# Patient Record
Sex: Male | Born: 1950
Health system: Southern US, Community
[De-identification: ages and names within clinical notes are randomized; demographics above are authoritative.]

## PROBLEM LIST (undated history)

## (undated) DIAGNOSIS — I10 Essential (primary) hypertension: Secondary | ICD-10-CM

## (undated) DIAGNOSIS — E785 Hyperlipidemia, unspecified: Secondary | ICD-10-CM

## (undated) DIAGNOSIS — I442 Atrioventricular block, complete: Secondary | ICD-10-CM

## (undated) DIAGNOSIS — M1990 Unspecified osteoarthritis, unspecified site: Secondary | ICD-10-CM

## (undated) DIAGNOSIS — C61 Malignant neoplasm of prostate: Secondary | ICD-10-CM

## (undated) DIAGNOSIS — Z950 Presence of cardiac pacemaker: Secondary | ICD-10-CM

## (undated) HISTORY — DX: Hyperlipidemia, unspecified: E78.5

## (undated) HISTORY — DX: Atrioventricular block, complete: I44.2

## (undated) HISTORY — PX: PACEMAKER INSERTION: SHX728

## (undated) HISTORY — DX: Essential (primary) hypertension: I10

## (undated) HISTORY — PX: OTHER SURGICAL HISTORY: SHX169

## (undated) HISTORY — PX: JOINT REPLACEMENT: SHX530

---

## 1999-02-11 ENCOUNTER — Encounter: Payer: Self-pay | Admitting: Internal Medicine

## 1999-02-11 ENCOUNTER — Inpatient Hospital Stay (HOSPITAL_COMMUNITY): Admission: AD | Admit: 1999-02-11 | Discharge: 1999-02-12 | Payer: Self-pay | Admitting: Internal Medicine

## 1999-02-12 ENCOUNTER — Encounter: Payer: Self-pay | Admitting: Internal Medicine

## 1999-04-26 ENCOUNTER — Ambulatory Visit (HOSPITAL_COMMUNITY): Admission: RE | Admit: 1999-04-26 | Discharge: 1999-04-26 | Payer: Self-pay | Admitting: *Deleted

## 2005-07-29 ENCOUNTER — Ambulatory Visit: Payer: Self-pay | Admitting: Internal Medicine

## 2006-05-28 ENCOUNTER — Ambulatory Visit: Payer: Self-pay | Admitting: Internal Medicine

## 2006-09-23 ENCOUNTER — Ambulatory Visit: Payer: Self-pay | Admitting: Internal Medicine

## 2007-10-05 ENCOUNTER — Ambulatory Visit: Payer: Self-pay | Admitting: Internal Medicine

## 2007-10-05 LAB — CONVERTED CEMR LAB
ALT: 32 units/L (ref 0–53)
AST: 22 units/L (ref 0–37)
Albumin: 4.3 g/dL (ref 3.5–5.2)
Alkaline Phosphatase: 53 units/L (ref 39–117)
BUN: 17 mg/dL (ref 6–23)
Basophils Absolute: 0 10*3/uL (ref 0.0–0.1)
Basophils Relative: 0.4 % (ref 0.0–1.0)
Bilirubin Urine: NEGATIVE
Bilirubin, Direct: 0.2 mg/dL (ref 0.0–0.3)
CO2: 29 meq/L (ref 19–32)
Calcium: 9.4 mg/dL (ref 8.4–10.5)
Chloride: 105 meq/L (ref 96–112)
Cholesterol: 136 mg/dL (ref 0–200)
Creatinine, Ser: 1.2 mg/dL (ref 0.4–1.5)
Eosinophils Absolute: 0.1 10*3/uL (ref 0.0–0.6)
Eosinophils Relative: 1.3 % (ref 0.0–5.0)
GFR calc Af Amer: 80 mL/min
GFR calc non Af Amer: 66 mL/min
Glucose, Bld: 128 mg/dL — ABNORMAL HIGH (ref 70–99)
Glucose, Urine, Semiquant: NEGATIVE
HCT: 43.6 % (ref 39.0–52.0)
HDL: 37.5 mg/dL — ABNORMAL LOW (ref >39.0)
Hemoglobin: 14.4 g/dL (ref 13.0–17.0)
Hgb A1c MFr Bld: 5.6 % (ref 4.6–6.0)
Ketones, urine, test strip: NEGATIVE
LDL Cholesterol: 80 mg/dL (ref 0–99)
Lymphocytes Relative: 24.5 % (ref 12.0–46.0)
MCHC: 33.1 g/dL (ref 30.0–36.0)
MCV: 88.5 fL (ref 78.0–100.0)
Monocytes Absolute: 0.5 10*3/uL (ref 0.2–0.7)
Monocytes Relative: 8.4 % (ref 3.0–11.0)
Neutro Abs: 3.5 10*3/uL (ref 1.4–7.7)
Neutrophils Relative %: 65.4 % (ref 43.0–77.0)
Nitrite: NEGATIVE
PSA: 1.85 ng/mL (ref 0.10–4.00)
Platelets: 205 10*3/uL (ref 150–400)
Potassium: 4.9 meq/L (ref 3.5–5.1)
Protein, U semiquant: NEGATIVE
RBC: 4.93 M/uL (ref 4.22–5.81)
RDW: 12.2 % (ref 11.5–14.6)
Sodium: 140 meq/L (ref 135–145)
Specific Gravity, Urine: 1.02
TSH: 3.36 microintl units/mL (ref 0.35–5.50)
Total Bilirubin: 1.1 mg/dL (ref 0.3–1.2)
Total CHOL/HDL Ratio: 3.6
Total Protein: 6.9 g/dL (ref 6.0–8.3)
Triglycerides: 92 mg/dL (ref 0–149)
Urobilinogen, UA: 0.2
VLDL: 18 mg/dL (ref 0–40)
WBC Urine, dipstick: NEGATIVE
WBC: 5.4 10*3/uL (ref 4.5–10.5)
pH: 5.5

## 2008-03-16 ENCOUNTER — Ambulatory Visit: Payer: Self-pay | Admitting: Internal Medicine

## 2008-03-16 DIAGNOSIS — I10 Essential (primary) hypertension: Secondary | ICD-10-CM | POA: Insufficient documentation

## 2008-05-09 ENCOUNTER — Ambulatory Visit: Payer: Self-pay | Admitting: Internal Medicine

## 2008-05-24 ENCOUNTER — Ambulatory Visit: Payer: Self-pay | Admitting: Internal Medicine

## 2008-05-31 ENCOUNTER — Telehealth: Payer: Self-pay | Admitting: Internal Medicine

## 2008-08-14 ENCOUNTER — Telehealth: Payer: Self-pay | Admitting: Internal Medicine

## 2008-11-04 ENCOUNTER — Encounter (INDEPENDENT_AMBULATORY_CARE_PROVIDER_SITE_OTHER): Payer: Self-pay | Admitting: *Deleted

## 2008-11-16 ENCOUNTER — Encounter: Payer: Self-pay | Admitting: Internal Medicine

## 2008-11-16 ENCOUNTER — Ambulatory Visit: Payer: Self-pay

## 2009-03-06 ENCOUNTER — Ambulatory Visit: Payer: Self-pay | Admitting: Internal Medicine

## 2009-03-06 LAB — CONVERTED CEMR LAB
ALT: 28 units/L (ref 0–53)
AST: 22 units/L (ref 0–37)
Albumin: 4 g/dL (ref 3.5–5.2)
Alkaline Phosphatase: 49 units/L (ref 39–117)
BUN: 28 mg/dL — ABNORMAL HIGH (ref 6–23)
Basophils Absolute: 0 10*3/uL (ref 0.0–0.1)
Basophils Relative: 0.3 % (ref 0.0–3.0)
Bilirubin Urine: NEGATIVE
Bilirubin, Direct: 0.1 mg/dL (ref 0.0–0.3)
CO2: 28 meq/L (ref 19–32)
Calcium: 8.8 mg/dL (ref 8.4–10.5)
Chloride: 109 meq/L (ref 96–112)
Cholesterol: 159 mg/dL (ref 0–200)
Creatinine, Ser: 1.3 mg/dL (ref 0.4–1.5)
Eosinophils Absolute: 0.1 10*3/uL (ref 0.0–0.7)
Eosinophils Relative: 2.4 % (ref 0.0–5.0)
GFR calc non Af Amer: 59.75 mL/min (ref >60)
Glucose, Bld: 112 mg/dL — ABNORMAL HIGH (ref 70–99)
HCT: 42 % (ref 39.0–52.0)
HDL: 42.7 mg/dL (ref >39.00)
Hemoglobin: 14.5 g/dL (ref 13.0–17.0)
Ketones, ur: NEGATIVE mg/dL
LDL Cholesterol: 97 mg/dL (ref 0–99)
Leukocytes, UA: NEGATIVE
Lymphocytes Relative: 23.6 % (ref 12.0–46.0)
Lymphs Abs: 1.3 10*3/uL (ref 0.7–4.0)
MCHC: 34.4 g/dL (ref 30.0–36.0)
MCV: 88.5 fL (ref 78.0–100.0)
Monocytes Absolute: 0.5 10*3/uL (ref 0.1–1.0)
Monocytes Relative: 9.1 % (ref 3.0–12.0)
Neutro Abs: 3.8 10*3/uL (ref 1.4–7.7)
Neutrophils Relative %: 64.6 % (ref 43.0–77.0)
Nitrite: NEGATIVE
PSA: 1.14 ng/mL (ref 0.10–4.00)
Platelets: 182 10*3/uL (ref 150.0–400.0)
Potassium: 4.5 meq/L (ref 3.5–5.1)
RBC: 4.75 M/uL (ref 4.22–5.81)
RDW: 12.5 % (ref 11.5–14.6)
Sodium: 141 meq/L (ref 135–145)
Specific Gravity, Urine: >=1.03 (ref 1.000–1.030)
TSH: 3.51 microintl units/mL (ref 0.35–5.50)
Total Bilirubin: 0.8 mg/dL (ref 0.3–1.2)
Total CHOL/HDL Ratio: 4
Total Protein, Urine: NEGATIVE mg/dL
Total Protein: 6.9 g/dL (ref 6.0–8.3)
Triglycerides: 97 mg/dL (ref 0.0–149.0)
Urine Glucose: NEGATIVE mg/dL
Urobilinogen, UA: 0.2 (ref 0.0–1.0)
VLDL: 19.4 mg/dL (ref 0.0–40.0)
WBC: 5.7 10*3/uL (ref 4.5–10.5)
pH: 5.5 (ref 5.0–8.0)

## 2009-03-28 ENCOUNTER — Ambulatory Visit: Payer: Self-pay | Admitting: Internal Medicine

## 2009-03-28 DIAGNOSIS — E785 Hyperlipidemia, unspecified: Secondary | ICD-10-CM | POA: Insufficient documentation

## 2009-03-28 DIAGNOSIS — I442 Atrioventricular block, complete: Secondary | ICD-10-CM | POA: Insufficient documentation

## 2009-05-01 ENCOUNTER — Ambulatory Visit: Payer: Self-pay | Admitting: Internal Medicine

## 2009-05-07 ENCOUNTER — Encounter (INDEPENDENT_AMBULATORY_CARE_PROVIDER_SITE_OTHER): Payer: Self-pay | Admitting: *Deleted

## 2009-06-13 ENCOUNTER — Encounter (INDEPENDENT_AMBULATORY_CARE_PROVIDER_SITE_OTHER): Payer: Self-pay | Admitting: *Deleted

## 2009-07-05 ENCOUNTER — Ambulatory Visit: Payer: Self-pay | Admitting: Internal Medicine

## 2009-10-23 ENCOUNTER — Ambulatory Visit: Payer: Self-pay | Admitting: Internal Medicine

## 2010-05-21 ENCOUNTER — Ambulatory Visit: Payer: Self-pay | Admitting: Family Medicine

## 2010-05-21 ENCOUNTER — Ambulatory Visit: Payer: Self-pay | Admitting: Internal Medicine

## 2010-05-23 DIAGNOSIS — Z950 Presence of cardiac pacemaker: Secondary | ICD-10-CM | POA: Insufficient documentation

## 2010-08-11 LAB — CONVERTED CEMR LAB
ALT: 27 units/L (ref 0–40)
AST: 19 units/L (ref 0–37)
Albumin: 4.1 g/dL (ref 3.5–5.2)
Alkaline Phosphatase: 49 units/L (ref 39–117)
BUN: 21 mg/dL (ref 6–23)
Basophils Absolute: 0 10*3/uL (ref 0.0–0.1)
Basophils Relative: 0.5 % (ref 0.0–1.0)
CO2: 29 meq/L (ref 19–32)
Calcium: 9.1 mg/dL (ref 8.4–10.5)
Chloride: 106 meq/L (ref 96–112)
Chol/HDL Ratio, serum: 3.4
Cholesterol: 146 mg/dL (ref 0–200)
Creatinine, Ser: 1.5 mg/dL (ref 0.4–1.5)
Eosinophil percent: 1.2 % (ref 0.0–5.0)
GFR calc non Af Amer: 51 mL/min
Glomerular Filtration Rate, Af Am: 62 mL/min/{1.73_m2}
Glucose, Bld: 107 mg/dL — ABNORMAL HIGH (ref 70–99)
HCT: 43.3 % (ref 39.0–52.0)
HDL: 43.1 mg/dL (ref >39.0)
Hemoglobin: 14.6 g/dL (ref 13.0–17.0)
LDL Cholesterol: 85 mg/dL (ref 0–99)
Lymphocytes Relative: 26.3 % (ref 12.0–46.0)
MCHC: 33.8 g/dL (ref 30.0–36.0)
MCV: 87.6 fL (ref 78.0–100.0)
Monocytes Absolute: 0.5 10*3/uL (ref 0.2–0.7)
Monocytes Relative: 8.5 % (ref 3.0–11.0)
Neutro Abs: 3.4 10*3/uL (ref 1.4–7.7)
Neutrophils Relative %: 63.5 % (ref 43.0–77.0)
PSA: 1 ng/mL (ref 0.10–4.00)
Platelets: 231 10*3/uL (ref 150–400)
Potassium: 4.2 meq/L (ref 3.5–5.1)
RBC: 4.94 M/uL (ref 4.22–5.81)
RDW: 12.5 % (ref 11.5–14.6)
Sodium: 141 meq/L (ref 135–145)
TSH: 2.57 microintl units/mL (ref 0.35–5.50)
Testosterone, total: 4.2428 ng/mL
Total Bilirubin: 0.8 mg/dL (ref 0.3–1.2)
Total Protein: 6.8 g/dL (ref 6.0–8.3)
Triglyceride fasting, serum: 89 mg/dL (ref 0–149)
VLDL: 18 mg/dL (ref 0–40)
WBC: 5.4 10*3/uL (ref 4.5–10.5)

## 2010-08-13 NOTE — Assessment & Plan Note (Signed)
Summary: pc2 medtronic      Allergies Added: NKDA  Visit Type:  Follow-up Primary Provider:  Birdie Sons MD   History of Present Illness: Devin Mercado returns today for followup.  He is a pleasant middle aged man with a h/o CHB, s/p PPM.  He denies c/p or sob.  He admits to some dietary indiscretion.  No peripheral edema. He denies syncope. Since I last saw him he admits to a very sedentary life style.  Current Medications (verified): 1)  Lipitor 20 Mg Tabs (Atorvastatin Calcium) .... Take 1 Tablet By Mouth At Bedtime 2)  Aspirin 81 Mg Tbec (Aspirin) .... Once Daily 3)  Lisinopril 10 Mg  Tabs (Lisinopril) .... Take 1 Tab By Mouth Daily  Allergies (verified): No Known Drug Allergies  Past History:  Past Medical History: Last updated: 03/28/2009 CHB Hypertension Hyperlipidemia  Past Surgical History: Last updated: 03/28/2009 Permanent pacemaker  Review of Systems  The patient denies chest pain, syncope, dyspnea on exertion, and peripheral edema.    Vital Signs:  Patient profile:   60 year old male Height:      71.5 inches Weight:      213 pounds BMI:     29.40 Pulse rate:   76 / minute BP sitting:   142 / 88  (left arm)  Vitals Entered By: Laurance Flatten CMA (May 21, 2010 3:01 PM)  Physical Exam  General:  Well developed, well nourished, in no acute distress.  HEENT: normal Neck: supple. No JVD. Carotids 2+ bilaterally no bruits Cor: RRR no rubs, gallops or murmur Lungs: CTA. Well healed PPM incision. Ab: soft, nontender. nondistended. No HSM. Good bowel sounds Ext: warm. no cyanosis, clubbing or edema Neuro: alert and oriented. Grossly nonfocal. affect pleasant    PPM Specifications Following MD:  Lewayne Bunting, MD     PPM Vendor:  Medtronic     PPM Model Number:  [id number]     PPM Serial Number:  [id number] PPM DOI:  02/11/1999     PPM Implanting MD:  Lewayne Bunting, MD  Lead 1    Location: RA     DOI: 02/11/1999     Model #: [id number]     Serial #: [id number]      Status: active Lead 2    Location: RV     DOI: 02/11/1999     Model #: [id number]     Serial #: [id number]     Status: active   Indications:  Complete Heart Block   PPM Follow Up Battery Voltage:  2.73 V     Battery Est. Longevity:  16 mths     Pacer Dependent:  Yes       PPM Device Measurements Atrium  Amplitude: 2.80 mV, Impedance: 488 ohms, Threshold: 1.00 V at 0.40 msec Right Ventricle  Amplitude: 11.20 mV, Impedance: 723 ohms, Threshold: 1.00 V at 0.40 msec  Episodes MS Episodes:  45     Percent Mode Switch:  0%     Coumadin:  No Ventricular High Rate:  1     Atrial Pacing:  7%     Ventricular Pacing:  41.9%  Parameters Mode:  DDD     Lower Rate Limit:  60     Upper Rate Limit:  140 Paced AV Delay:  250     Sensed AV Delay:  250 Next Cardiology Appt Due:  11/12/2010 Tech Comments:  45 MODE SWITCHES--LONGEST WAS 12 MINUTES 25 SECONDS. 1 VHR EPISODE LASTING 2 SECONDS.  NORMAL DEVICE  FUNCTION.  NO CHANGES MADE. ROV IN 6 MTHS W/DEVICE CLINIC. Vella Kohler  May 21, 2010 3:07 PM MD Comments:  His mode switch rates are less than 200/min.  Impression & Recommendations:  Problem # 1:  CARDIAC PACEMAKER IN SITU (ICD-V45.01) His device is working normally.  Will recheck in several months.  Problem # 2:  HYPERTENSION (ICD-401.9) His blood pressure is fairly well controlled. Continue a low sodium diet.  His updated medication list for this problem includes:    Aspirin 81 Mg Tbec (Aspirin) ..... Once daily    Lisinopril 10 Mg Tabs (Lisinopril) .Marland Kitchen... Take 1 tab by mouth daily  Patient Instructions: 1)  Your physician wants you to follow-up in: 12 months with Dr Court Joy will receive a reminder letter in the mail two months in advance. If you don't receive a letter, please call our office to schedule the follow-up appointment.

## 2010-08-13 NOTE — Assessment & Plan Note (Signed)
Summary: DEVICE/SAF      Allergies Added: NKDA  Visit Type:  Follow-up Primary Provider:  Birdie Sons MD   History of Present Illness: Devin Mercado returns today for followup.  He is a pleasant middle aged man with a h/o CHB, s/p PPM.  He denies c/p or sob.  He admits to some dietary indiscretion.  No peripheral edema. He denies syncope.  Current Medications (verified): 1)  Lipitor 20 Mg Tabs (Atorvastatin Calcium) .... Take 1 Tablet By Mouth At Bedtime 2)  Aspirin 81 Mg Tbec (Aspirin) .... Once Daily 3)  Lisinopril 10 Mg  Tabs (Lisinopril) .... Take 1 Tab By Mouth Daily  Allergies (verified): No Known Drug Allergies  Past History:  Past Medical History: Last updated: 03/28/2009 CHB Hypertension Hyperlipidemia  Past Surgical History: Last updated: 03/28/2009 Permanent pacemaker  Review of Systems  The patient denies chest pain, syncope, dyspnea on exertion, and peripheral edema.    Vital Signs:  Patient profile:   60 year old male Height:      71.5 inches Weight:      218 pounds BMI:     30.09 Pulse rate:   75 / minute BP sitting:   154 / 88  (left arm)  Vitals Entered By: Laurance Flatten CMA (October 23, 2009 3:42 PM)  Physical Exam  General:  Well developed, well nourished, in no acute distress.  HEENT: normal Neck: supple. No JVD. Carotids 2+ bilaterally no bruits Cor: RRR no rubs, gallops or murmur Lungs: CTA. Well healed PPM incision. Ab: soft, nontender. nondistended. No HSM. Good bowel sounds Ext: warm. no cyanosis, clubbing or edema Neuro: alert and oriented. Grossly nonfocal. affect pleasant    PPM Specifications Following MD:  Lewayne Bunting, MD     PPM Vendor:  Medtronic     PPM Model Number:  [id number]     PPM Serial Number:  [id number] PPM DOI:  02/11/1999     PPM Implanting MD:  Lewayne Bunting, MD  Lead 1    Location: RA     DOI: 02/11/1999     Model #: [id number]     Serial #: [id number]     Status: active Lead 2    Location: RV     DOI: 02/11/1999     Model  #: [id number]     Serial #: [id number]     Status: active   Indications:  Complete Heart Block   PPM Follow Up Remote Check?  No Battery Voltage:  2.71 V     Battery Est. Longevity:  19 months     Pacer Dependent:  Yes       PPM Device Measurements Atrium  Amplitude: 2.8 mV, Impedance: 478 ohms, Threshold: 1.0 V at 0.4 msec Right Ventricle  Impedance: 741 ohms, Threshold: 1.0 V at 0.4 msec  Episodes MS Episodes:  18     Percent Mode Switch:  0%     Coumadin:  No Ventricular High Rate:  0     Atrial Pacing:  5.2%     Ventricular Pacing:  86.1%  Parameters Mode:  DDD     Lower Rate Limit:  60     Paced AV Delay:  250     Sensed AV Delay:  250 Next Cardiology Appt Due:  04/13/2010 Tech Comments:  No parameter changes.  Battery nearing ERI.  ROV 6 months with Dr.  Ladona Ridgel. Altha Harm, LPN  October 23, 2009 3:47 PM  MD Comments:  Agree with above.  Impression & Recommendations:  Problem # 1:  ATRIOVENTRICULAR BLOCK, COMPLETE (ICD-426.0) His PPM is working normally.  Continue followup in several months. His updated medication list for this problem includes:    Aspirin 81 Mg Tbec (Aspirin) ..... Once daily    Lisinopril 10 Mg Tabs (Lisinopril) .Marland Kitchen... Take 1 tab by mouth daily  Problem # 2:  HYPERLIPIDEMIA (ICD-272.4) A low fat diet and continued lipitor are recommended. His updated medication list for this problem includes:    Lipitor 20 Mg Tabs (Atorvastatin calcium) .Marland Kitchen... Take 1 tablet by mouth at bedtime  Problem # 3:  HYPERTENSION (ICD-401.9) I have asked him to watch his blood pressure which is up today.  He admits to some sodium indiscretion. His updated medication list for this problem includes:    Aspirin 81 Mg Tbec (Aspirin) ..... Once daily    Lisinopril 10 Mg Tabs (Lisinopril) .Marland Kitchen... Take 1 tab by mouth daily  Patient Instructions: 1)  Your physician recommends that you schedule a follow-up appointment in: 6 months with device clinic 12 months with Dr Ladona Ridgel

## 2010-08-13 NOTE — Assessment & Plan Note (Signed)
Summary: FLU SHOT//SLM   Nurse Visit   Allergies: No Known Drug Allergies  Orders Added: 1)  Admin 1st Vaccine [90471] 2)  Flu Vaccine 63yrs + [54098]   Flu Vaccine Consent Questions     Do you have a history of severe allergic reactions to this vaccine? no    Any prior history of allergic reactions to egg and/or gelatin? no    Do you have a sensitivity to the preservative Thimersol? no    Do you have a past history of Guillan-Barre Syndrome? no    Do you currently have an acute febrile illness? no    Have you ever had a severe reaction to latex? no    Vaccine information given and explained to patient? yes    Are you currently pregnant? no    Lot Number:[id number]   Exp Date:01/11/2011   Site Given  Left Deltoid IM Romualdo Bolk, CMA (AAMA)  May 21, 2010 2:03 PM

## 2010-08-15 NOTE — Cardiovascular Report (Signed)
Summary: Office Visit   Office Visit   Imported By: Roderic Ovens 07/04/2010 13:57:27  _____________________________________________________________________  External Attachment:    Type:   Image     Comment:   External Document

## 2010-11-20 ENCOUNTER — Ambulatory Visit (INDEPENDENT_AMBULATORY_CARE_PROVIDER_SITE_OTHER): Payer: BC Managed Care – PPO | Admitting: *Deleted

## 2010-11-20 DIAGNOSIS — Z950 Presence of cardiac pacemaker: Secondary | ICD-10-CM

## 2010-11-20 DIAGNOSIS — I442 Atrioventricular block, complete: Secondary | ICD-10-CM

## 2010-11-26 NOTE — Assessment & Plan Note (Signed)
Devin Mercado                         ELECTROPHYSIOLOGY OFFICE NOTE   Devin Mercado, Devin Mercado                       MRN:          [id number]  DATE:05/09/2008                            DOB:          October 26, 1950    Devin Mercado returns today for followup.  He is a very pleasant spouse of  Dr. Berniece Andreas with a history of hypertension and complete heart block  status post pacemaker insertion.  He returns today for followup.  He has  been stable since we last saw him several months ago.  He denies chest  pain, shortness of breath, or other palpitations.   His medicines include:  1. Lipitor 20 a day.  2. Aspirin 81 mg daily.  3. Lisinopril 10 mg daily (this was recently started by Dr. Cato Mulligan).   On physical examination, he is a pleasant, well-appearing, middle-aged  man in no distress.  Blood pressure was 146/91, the pulse 96 and  regular, respirations were 18.  The weight was 223 pounds.  Neck  revealed no jugular venous distention.  Lungs clear bilaterally to  auscultation.  No wheezes, rales, or rhonchi are present.  There is no  increased work of breathing.  Cardiovascular exam revealed a regular  rate and rhythm.  Normal S1 and S2.  The PMI was not enlarged or  laterally displaced.  The abdominal exam was soft, nontender,  nondistended.  There was no organomegaly.  Extremities demonstrated no  edema.   Interrogation of pacemaker demonstrates a Medtronic Sigma dual-chamber  device, the P-waves greater than 2, the R-waves greater than 11, the  impedance 497 in the A and 596 in the V, the threshold was a volt of 0.4  both in the right atrium and in the right ventricle.  Battery longevity  is approximately 2-1/2 years.  Voltage today is 2.75 V.   IMPRESSION:  1. Symptomatic bradycardia.  2. Complete heart block, causing symptomatic bradycardia.  3. Hypertension.  4. Dyslipidemia.  5. Status post pacemaker.   DISCUSSION:  Devin Mercado is stable, and his  pacemaker is working  normally.  His blood pressure is recently well controlled.  His followup  for his dyslipidemia will be by Dr. Cato Mulligan.  I will see him back for  pacemaker followup in approximately 1 year.     Doylene Canning. Devin Ridgel, MD  Electronically Signed   GWT/MedQ  DD: 05/09/2008  DT: 05/10/2008  Job #: [id number]   cc:   Valetta Mole. Swords, MD

## 2010-11-26 NOTE — Assessment & Plan Note (Signed)
[facility]                         ELECTROPHYSIOLOGY OFFICE NOTE   YOTAM, RHINE                       MRN:          [id number]  DATE:10/05/2007                            DOB:          05-Jun-1951    Mr. Stigall returns today for followup.  He is a very pleasant. middle-  aged male with a history of left bundle branch block which resulted in  development of complete heart block, history of hypertension (previously  controlled with diet and exercise) who is status post permanent  pacemaker insertion which was carried out back in July 2000.  He returns  today for followup.  He has no specific complaints today except for  occasional palpitations.  He denies chest pain or shortness of breath.  He admits to some sedentary lifestyle, however, and is not exercising  regularly.   MEDICATIONS:  1. Lipitor 20 a day.  2. Aspirin 81 a day.   PHYSICAL EXAMINATION:  GENERAL:  A pleasant, middle-aged man with no  acute distress.  VITAL SIGNS:  Blood pressure was 169/107, pulse 90 and regular,  respirations were 18.  Weight was 218 pounds.  NECK:  No jugular distention.  LUNGS:  Clear bilaterally to auscultation.  No wheezes, rales or rhonchi  are present.  CARDIAC:  Regular rate and rhythm with normal S1, S2.  EXTREMITIES:  No peripheral edema.   Interrogation of his pacemaker demonstrates a Medtronic sigma dual-  chamber device.  There are no R waves and P waves were greater than 2.8.  The impedance 498 in the A, 723 in the V.  The threshold was a volt of  0.4 in both the right atrium and right ventricle.  Battery voltage was  2.74 V.  He was 80% A-paced and 86% V-paced.   IMPRESSION:  1. Complete heart block.  2. Hypertension.  3. Status post pacemaker insertion.   DISCUSSION:  Overall, Mr. Guevara is stable, but I have told him that I  am concerned about his blood pressure being elevated.  He states that  when he takes it at home, his blood  pressure is okay, but does admit  that it may be elevated during the day when he is working.  To this end,  I have asked him to continue to check his blood pressure on a regular  basis, include the date and time and get this data to Dr. Birdie Sons,  who is his primary physician.  He, I suspect, will  require medical therapy for his hypertension and I will defer the  decisions on this to Dr. Cato Mulligan.  I will see the patient back for  pacemaker check in 1 year.  He will see our clinic in 6 months.     Doylene Canning. Ladona Ridgel, MD  Electronically Signed    GWT/MedQ  DD: 10/05/2007  DT: 10/05/2007  Job #: [id number]   cc:   Valetta Mole. Swords, MD

## 2010-11-29 NOTE — Assessment & Plan Note (Signed)
[facility]                         ELECTROPHYSIOLOGY OFFICE NOTE   OLLY, SHINER                       MRN:          [id number]  DATE:09/23/2006                            DOB:          05/31/1951    Devin Mercado returns today for followup.  He is a very pleasant middle-  aged man with complete heart block who is status post permanent  pacemaker insertion back in 2000.  He returns today for followup.  He  denies chest pain or shortness of breath and overall has been stable.  He denies palpitations.  He remains very active.   PHYSICAL EXAM:  He is a pleasant, well-appearing middle-aged man in no  acute distress.  His blood pressure today was 164/98, the pulse was 70  and regular, the respirations were 18, the weight was 218 pounds.  NECK:  No jugular venous distention.  LUNGS:  Clear bilaterally to auscultation.  CARDIOVASCULAR:  Regular rate and rhythm with a normal S1 and S2.  EXTREMITIES:  No edema.   Interrogation of his pacemaker demonstrates a Medtronic Sigma with P  waves of 2, there were no R waves.  The impedance 521 in the atrium, 792  in the ventricle, the threshold was a volt of 0.4 in both the atrium and  the ventricle.  The battery voltage was 2.76 volts.   IMPRESSION:  1. Symptomatic complete heart block.  2. Status post pacemaker insertion.  3. Dyslipidemia on Lipitor.   DISCUSSION:  Overall, Devin Mercado is stable.  His pacemaker is working  normally.  Will plan to see him back in the office in 1 year.     Doylene Canning. Ladona Ridgel, MD  Electronically Signed    GWT/MedQ  DD: 09/23/2006  DT: 09/25/2006  Job #: [id number]

## 2010-11-29 NOTE — Assessment & Plan Note (Signed)
[facility]                                 ON-CALL NOTE   Devin Mercado, Devin Mercado                       MRN:          [id number]  DATE:02/04/2008                            DOB:          11-Apr-1951    REGULAR DOCTOR:  Dr. Cato Mulligan.   ON CALL:  Marne A. Tower, MD   Phone number is [phone].  I spoke to his wife, Devin Mercado.  Chief complaint  is bug bite.  He was in [place] out in the woods about 3 weeks ago.  He has no noticed tick bites, however, has had an inflamed insect bite  on his leg for several days.  It is getting round approaching 8 cm,  erythematous with possible appearance of a target shape.  Concern was  for a possible Lyme disease given the size and shape of the lesion.  Denies fever, chills, or other symptoms.  I called in doxycycline 100 mg  b.i.d. x10 days, #20 with no refills to the [facility] [phone], and he will follow up with Dr. Cato Mulligan next week.  I advised him to  call back for any questions, concerns, or his symptoms change in any  way.     Marne A. Tower, MD  Electronically Signed    MAT/MedQ  DD: 02/04/2008  DT: 02/05/2008  Job #: [id number]

## 2010-12-19 ENCOUNTER — Encounter: Payer: Self-pay | Admitting: *Deleted

## 2011-02-12 ENCOUNTER — Ambulatory Visit (INDEPENDENT_AMBULATORY_CARE_PROVIDER_SITE_OTHER): Payer: BC Managed Care – PPO | Admitting: *Deleted

## 2011-02-12 ENCOUNTER — Other Ambulatory Visit: Payer: Self-pay | Admitting: Internal Medicine

## 2011-02-12 DIAGNOSIS — I442 Atrioventricular block, complete: Secondary | ICD-10-CM

## 2011-02-12 LAB — PACEMAKER DEVICE OBSERVATION
ATRIAL PACING PM: 6.3
RV LEAD IMPEDENCE PM: 740 Ohm

## 2011-02-12 NOTE — Progress Notes (Signed)
Pacer check for battery longevity.

## 2011-04-30 ENCOUNTER — Ambulatory Visit (INDEPENDENT_AMBULATORY_CARE_PROVIDER_SITE_OTHER): Payer: BC Managed Care – PPO | Admitting: *Deleted

## 2011-04-30 DIAGNOSIS — Z950 Presence of cardiac pacemaker: Secondary | ICD-10-CM

## 2011-04-30 DIAGNOSIS — I442 Atrioventricular block, complete: Secondary | ICD-10-CM

## 2011-04-30 LAB — PACEMAKER DEVICE OBSERVATION

## 2011-04-30 NOTE — Progress Notes (Signed)
Pacer check in clinic  

## 2011-05-06 ENCOUNTER — Ambulatory Visit (INDEPENDENT_AMBULATORY_CARE_PROVIDER_SITE_OTHER): Payer: BC Managed Care – PPO | Admitting: Internal Medicine

## 2011-05-06 ENCOUNTER — Encounter: Payer: Self-pay | Admitting: Internal Medicine

## 2011-05-06 ENCOUNTER — Encounter: Payer: Self-pay | Admitting: *Deleted

## 2011-05-06 ENCOUNTER — Telehealth: Payer: Self-pay | Admitting: Internal Medicine

## 2011-05-06 DIAGNOSIS — Z950 Presence of cardiac pacemaker: Secondary | ICD-10-CM

## 2011-05-06 DIAGNOSIS — I10 Essential (primary) hypertension: Secondary | ICD-10-CM

## 2011-05-06 DIAGNOSIS — I442 Atrioventricular block, complete: Secondary | ICD-10-CM

## 2011-05-06 DIAGNOSIS — I495 Sick sinus syndrome: Secondary | ICD-10-CM

## 2011-05-06 LAB — BASIC METABOLIC PANEL
Calcium: 8.5 mg/dL (ref 8.4–10.5)
Creatinine, Ser: 1.4 mg/dL (ref 0.4–1.5)
GFR: 54.02 mL/min — ABNORMAL LOW (ref >60.00)
Glucose, Bld: 87 mg/dL (ref 70–99)
Sodium: 140 mEq/L (ref 135–145)

## 2011-05-06 LAB — CBC WITH DIFFERENTIAL/PLATELET
Basophils Absolute: 0 10*3/uL (ref 0.0–0.1)
Hemoglobin: 14.2 g/dL (ref 13.0–17.0)
Lymphocytes Relative: 23.5 % (ref 12.0–46.0)
Monocytes Relative: 8.6 % (ref 3.0–12.0)
Neutro Abs: 3.8 10*3/uL (ref 1.4–7.7)
Neutrophils Relative %: 66.6 % (ref 43.0–77.0)
Platelets: 213 10*3/uL (ref 150.0–400.0)
RDW: 14 % (ref 11.5–14.6)

## 2011-05-06 LAB — PROTIME-INR
INR: 1 ratio (ref 0.8–1.0)
Prothrombin Time: 11.1 s (ref 10.2–12.4)

## 2011-05-06 NOTE — Telephone Encounter (Signed)
Pt calling wanting to talk to nurse about procedure scheduled. Please call back.

## 2011-05-06 NOTE — Assessment & Plan Note (Signed)
He has reached ERI and has reverted to VVI. Will schedule device change out.

## 2011-05-06 NOTE — Progress Notes (Signed)
HPI [name] returns today for followup. He is a pleasant 60 yo man with a h/o CHB, s/p PPM. He also has HTN. He has done well until 2 weeks ago. This coincides with his PPM reverting to the VVI mode. He denies dietary non-compliance. No sob or c/p. He has not had syncope. No Known Allergies   Current Outpatient Prescriptions  Medication Sig Dispense Refill  . aspirin 81 MG tablet Take 81 mg by mouth daily.        Marland Kitchen atorvastatin (LIPITOR) 20 MG tablet Take 20 mg by mouth daily.        Marland Kitchen lisinopril (PRINIVIL,ZESTRIL) 10 MG tablet Take 10 mg by mouth daily.           Past Medical History  Diagnosis Date  . CHB (complete heart block)   . HTN (hypertension)   . Hyperlipidemia     ROS:   All systems reviewed and negative except as noted in the HPI.   Past Surgical History  Procedure Date  . Permanent pacemaker      Family History  Problem Relation Age of Onset  . Stroke Mother     multiple  . Prostate cancer Father   . Stroke Father      History   Social History  . Marital Status: Married    Spouse Name: N/A    Number of Children: 4  . Years of Education: N/A   Occupational History  . Not on file.   Social History Main Topics  . Smoking status: Never Smoker   . Smokeless tobacco: Not on file  . Alcohol Use: Yes  . Drug Use: Not on file  . Sexually Active: Not on file   Other Topics Concern  . Not on file   Social History Narrative  . No narrative on file     BP 148/88  Pulse 65  Ht 6' (1.829 m)  Wt 223 lb 12.8 oz (101.515 kg)  BMI 30.35 kg/m2  Physical Exam:  Well appearing NAD HEENT: Unremarkable Neck:  No JVD, no thyromegally Lymphatics:  No adenopathy Back:  No CVA tenderness Lungs:  Clear with a well healed PPM incision. HEART:  Regular rate rhythm, no murmurs, no rubs, no clicks Abd:  soft, positive bowel sounds, no organomegally, no rebound, no guarding Ext:  2 plus pulses, no edema, no cyanosis, no clubbing Skin:  No rashes no  nodules Neuro:  CN II through XII intact, motor grossly intact   DEVICE  Normal device function.  See PaceArt for details.   Assess/Plan:

## 2011-05-06 NOTE — Patient Instructions (Signed)
Your physician has recommended that you have a pacemaker generator changed

## 2011-05-06 NOTE — Assessment & Plan Note (Signed)
His blood pressure is minimally elevated. I have asked him to reduce his sodium intake. He will continue his current meds.

## 2011-05-06 NOTE — Telephone Encounter (Signed)
Spoke with patient  Will change his procedure date and time per his request to this Fri 05/09/11  Be at the hospital at 5:30am for a 7:30 case  Patient aware

## 2011-05-09 ENCOUNTER — Ambulatory Visit (HOSPITAL_COMMUNITY)
Admission: RE | Admit: 2011-05-09 | Discharge: 2011-05-09 | Disposition: A | Discharge status: Home / Self Care | Payer: BC Managed Care – PPO | Source: Ambulatory Visit | Attending: Internal Medicine | Admitting: Internal Medicine

## 2011-05-09 ENCOUNTER — Ambulatory Visit (HOSPITAL_COMMUNITY): Payer: BC Managed Care – PPO

## 2011-05-09 DIAGNOSIS — I442 Atrioventricular block, complete: Secondary | ICD-10-CM

## 2011-05-09 DIAGNOSIS — I459 Conduction disorder, unspecified: Secondary | ICD-10-CM

## 2011-05-09 DIAGNOSIS — Z45018 Encounter for adjustment and management of other part of cardiac pacemaker: Secondary | ICD-10-CM | POA: Insufficient documentation

## 2011-05-09 LAB — SURGICAL PCR SCREEN
MRSA, PCR: NEGATIVE
Staphylococcus aureus: NEGATIVE

## 2011-05-11 NOTE — Op Note (Signed)
  NAMEGRAYLAND, DAISEY              ACCOUNT NO.:  [id number]  MEDICAL RECORD NO.:  [id number]  LOCATION:  [facility]                         FACILITY:  [facility]  PHYSICIAN:  Doylene Canning. Ladona Ridgel, MD    DATE OF BIRTH:  1951-03-05  DATE OF PROCEDURE:  05/09/2011 DATE OF DISCHARGE:                              OPERATIVE REPORT   PROCEDURE PERFORMED:  Removal of previously implanted dual-chamber pacemaker, which had reached the elective replacement indication and insertion of a new dual-chamber pacemaker.  INDICATION:  Symptomatic complete heart block status post pacemaker with pacemaker at elective replacement.  INTRODUCTION:  The patient is a very pleasant 60 year old man with a longstanding history of complete heart block, who underwent permanent pacemaker insertion in 2000.  He has done well since then.  He has reached elective replacement indication.  He is now referred for pacemaker generator change.  PROCEDURE:  After informed consent was obtained, the patient was taken to the diagnostic EP lab in a fasting state.  After usual preparation and draping, intravenous fentanyl and midazolam was given for sedation. Lidocaine 30 mL was infiltrated into the left infraclavicular region.  A 5 cm incision was carried out over this region, and electrocautery was utilized to dissect down to the pacemaker pocket.  The generator was removed without difficulty.  The atrial lead was disconnected and found to be working satisfactorily with P-waves of 6 and an impedance of 400 ohms, a threshold was less than a volt at 0.5 msec.  At this point, the ventricular lead was disconnected from the old pacemaker generator and evaluated.  There were off course no R-waves secondary to his underlying complete heart block.  The impedance was 500 ohms, the threshold was less than a volt at 0.5 msec.  At this point, the new Medtronic Adapta L dual-chamber pacemaker, serial number [id number] was connected to the atrial  and ventricular leads and placed back in the subcutaneous pocket. The pocket was irrigated with antibiotic irrigation.  The pocket was then revised to accommodate the larger sized pacemaker.  Having accomplished this, electrocautery was utilized to assure hemostasis and antibiotic irrigation was utilized to irrigate the pocket.  The incision was then closed with 2-0 and 3-0 Vicryl.  Benzoin and Steri-Strips were painted on the skin, pressure dressing was applied, and the patient was returned to his room in satisfactory condition.  COMPLICATIONS:  There were no immediate procedure complications.  RESULTS:  This demonstrate successful removal of a previously implanted dual-chamber pacemaker, which had reached elective replacement followed by insertion of a new dual-chamber pacemaker, which was preceded by pacemaker pocket revision.    Doylene Canning. Ladona Ridgel, MD    GWT/MEDQ  D:  05/09/2011  T:  05/09/2011  Job:  [id number]  Electronically Signed by Lewayne Bunting MD on 05/11/2011 12:21:26 PM

## 2011-05-15 ENCOUNTER — Encounter: Payer: BC Managed Care – PPO | Admitting: *Deleted

## 2011-06-06 ENCOUNTER — Ambulatory Visit (INDEPENDENT_AMBULATORY_CARE_PROVIDER_SITE_OTHER): Payer: BC Managed Care – PPO | Admitting: *Deleted

## 2011-06-06 DIAGNOSIS — Z23 Encounter for immunization: Secondary | ICD-10-CM

## 2011-06-19 ENCOUNTER — Ambulatory Visit (INDEPENDENT_AMBULATORY_CARE_PROVIDER_SITE_OTHER): Payer: BC Managed Care – PPO | Admitting: *Deleted

## 2011-06-19 ENCOUNTER — Encounter: Payer: Self-pay | Admitting: Internal Medicine

## 2011-06-19 DIAGNOSIS — I442 Atrioventricular block, complete: Secondary | ICD-10-CM

## 2011-06-19 LAB — PACEMAKER DEVICE OBSERVATION
AL AMPLITUDE: 2.8 mv
BATTERY VOLTAGE: 2.79 V
VENTRICULAR PACING PM: 100

## 2011-06-19 NOTE — Progress Notes (Signed)
PPM check 

## 2011-08-15 ENCOUNTER — Ambulatory Visit (INDEPENDENT_AMBULATORY_CARE_PROVIDER_SITE_OTHER): Payer: BC Managed Care – PPO | Admitting: Internal Medicine

## 2011-08-15 ENCOUNTER — Encounter: Payer: Self-pay | Admitting: Internal Medicine

## 2011-08-15 VITALS — BP 138/84 | HR 65 | Ht 72.0 in | Wt 224.8 lb

## 2011-08-15 DIAGNOSIS — I10 Essential (primary) hypertension: Secondary | ICD-10-CM

## 2011-08-15 DIAGNOSIS — I442 Atrioventricular block, complete: Secondary | ICD-10-CM

## 2011-08-15 DIAGNOSIS — Z950 Presence of cardiac pacemaker: Secondary | ICD-10-CM

## 2011-08-15 DIAGNOSIS — E785 Hyperlipidemia, unspecified: Secondary | ICD-10-CM

## 2011-08-15 LAB — PACEMAKER DEVICE OBSERVATION
AL THRESHOLD: 0.875 V
BAMS-0001: 150 {beats}/min
RV LEAD THRESHOLD: 1.25 V

## 2011-08-15 MED ORDER — LISINOPRIL 10 MG PO TABS: 10.0000 mg | ORAL_TABLET | Freq: Every day | ORAL | Status: DC

## 2011-08-15 NOTE — Assessment & Plan Note (Signed)
His device is working normally. Battery longevity is estimated at 12 years. He will followup in several months.

## 2011-08-15 NOTE — Progress Notes (Signed)
HPI Mr. Casher returns today for followup. He is a very pleasant 61 year old man with a history of complete heart block, status post permanent pacemaker insertion. In the interim he has done well. No chest pain, sob or syncope. He went skiing and at altitude he felt some palpitations. No peripheral edema, fevers, or chills. No Known Allergies   Current Outpatient Prescriptions  Medication Sig Dispense Refill  . aspirin 81 MG tablet Take 81 mg by mouth daily.        Marland Kitchen atorvastatin (LIPITOR) 20 MG tablet Take 20 mg by mouth daily.        Marland Kitchen lisinopril (PRINIVIL,ZESTRIL) 10 MG tablet Take 10 mg by mouth daily.           Past Medical History  Diagnosis Date  . CHB (complete heart block)   . HTN (hypertension)   . Hyperlipidemia     ROS:   All systems reviewed and negative except as noted in the HPI.   Past Surgical History  Procedure Date  . Permanent pacemaker      Family History  Problem Relation Age of Onset  . Stroke Mother     multiple  . Prostate cancer Father   . Stroke Father      History   Social History  . Marital Status: Married    Spouse Name: N/A    Number of Children: 4  . Years of Education: N/A   Occupational History  . Not on file.   Social History Main Topics  . Smoking status: Never Smoker   . Smokeless tobacco: Not on file  . Alcohol Use: Yes  . Drug Use: Not on file  . Sexually Active: Not on file   Other Topics Concern  . Not on file   Social History Narrative  . No narrative on file     BP 138/84  Pulse 65  Ht 6' (1.829 m)  Wt 101.969 kg (224 lb 12.8 oz)  BMI 30.49 kg/m2  Physical Exam:  Well appearing NAD HEENT: Unremarkable Neck:  No JVD, no thyromegally Lungs:  Clear with no wheezes, rales, or rhonchi. Well-healed pacemaker incision. HEART:  Regular rate rhythm, no murmurs, no rubs, no clicks Abd:  soft, positive bowel sounds, no organomegally, no rebound, no guarding Ext:  2 plus pulses, no edema, no cyanosis, no  clubbing Skin:  No rashes no nodules Neuro:  CN II through XII intact, motor grossly intact  DEVICE  Normal device function.  See PaceArt for details.   Assess/Plan:

## 2011-08-15 NOTE — Assessment & Plan Note (Signed)
His blood pressure is minimally elevated today. He is instructed to continue his current medical therapy and maintain a low sodium diet.

## 2011-08-15 NOTE — Assessment & Plan Note (Signed)
He is encouraged on the importance of a low fat diet and weight loss. No changes in medications at this time.

## 2011-08-15 NOTE — Patient Instructions (Signed)
Your physician wants you to follow-up in: Oct. 2013 with Dr Court Joy will receive a reminder letter in the mail two months in advance. If you don't receive a letter, please call our office to schedule the follow-up appointment.

## 2011-09-22 ENCOUNTER — Encounter: Payer: Self-pay | Admitting: Internal Medicine

## 2012-05-05 ENCOUNTER — Encounter: Payer: Self-pay | Admitting: *Deleted

## 2012-05-13 ENCOUNTER — Encounter: Payer: Self-pay | Admitting: Internal Medicine

## 2012-05-13 ENCOUNTER — Other Ambulatory Visit: Payer: Self-pay | Admitting: *Deleted

## 2012-05-13 ENCOUNTER — Other Ambulatory Visit: Payer: Self-pay | Admitting: Internal Medicine

## 2012-05-13 ENCOUNTER — Ambulatory Visit (INDEPENDENT_AMBULATORY_CARE_PROVIDER_SITE_OTHER): Payer: BC Managed Care – PPO | Admitting: Internal Medicine

## 2012-05-13 VITALS — BP 162/78 | HR 65 | Ht 72.0 in | Wt 216.0 lb

## 2012-05-13 DIAGNOSIS — I10 Essential (primary) hypertension: Secondary | ICD-10-CM

## 2012-05-13 DIAGNOSIS — Z950 Presence of cardiac pacemaker: Secondary | ICD-10-CM

## 2012-05-13 DIAGNOSIS — I442 Atrioventricular block, complete: Secondary | ICD-10-CM

## 2012-05-13 DIAGNOSIS — Z23 Encounter for immunization: Secondary | ICD-10-CM

## 2012-05-13 LAB — PACEMAKER DEVICE OBSERVATION
AL IMPEDENCE PM: 408 Ohm
AL THRESHOLD: 1 V
RV LEAD IMPEDENCE PM: 680 Ohm
RV LEAD THRESHOLD: 1 V

## 2012-05-13 MED ORDER — LISINOPRIL 10 MG PO TABS: 10.0000 mg | ORAL_TABLET | Freq: Every day | ORAL | Status: DC

## 2012-05-13 NOTE — Progress Notes (Signed)
HPI Devin Mercado returns today for followup. He is a very pleasant 61 year old man with hypertension, dyslipidemia, and complete heart block. He is status post pacemaker insertion and underwent generator change out several months ago. He is here today because he noticed palpitations at certain times a day, typically in the morning, and always with physical activity or exertion. He notes that when he walks down the hall going into court, he will feel like his heart is racing and beating irregularly. He has not had syncope or near-syncope he does get a bit dizzy. The episodes resolved with rest. It may occur for a couple of minutes. No Known Allergies   Current Outpatient Prescriptions  Medication Sig Dispense Refill  . aspirin 81 MG tablet Take 81 mg by mouth daily.        Marland Kitchen atorvastatin (LIPITOR) 20 MG tablet Take 20 mg by mouth daily.        Marland Kitchen lisinopril (PRINIVIL,ZESTRIL) 10 MG tablet Take 1 tablet (10 mg total) by mouth daily.  90 tablet  3  . DISCONTD: lisinopril (PRINIVIL,ZESTRIL) 10 MG tablet Take 1 tablet (10 mg total) by mouth daily.  90 tablet  3  . DISCONTD: lisinopril (PRINIVIL,ZESTRIL) 10 MG tablet Take 1 tablet (10 mg total) by mouth daily.  90 tablet  3     Past Medical History  Diagnosis Date  . CHB (complete heart block)   . HTN (hypertension)   . Hyperlipidemia     ROS:   All systems reviewed and negative except as noted in the HPI.   Past Surgical History  Procedure Date  . Permanent pacemaker      Family History  Problem Relation Age of Onset  . Stroke Mother     multiple  . Prostate cancer Father   . Stroke Father      History   Social History  . Marital Status: Married    Spouse Name: N/A    Number of Children: 4  . Years of Education: N/A   Occupational History  . Not on file.   Social History Main Topics  . Smoking status: Never Smoker   . Smokeless tobacco: Not on file  . Alcohol Use: Yes  . Drug Use: Not on file  . Sexually Active: Not  on file   Other Topics Concern  . Not on file   Social History Narrative  . No narrative on file     BP 162/78  Pulse 65  Ht 6' (1.829 m)  Wt 216 lb (97.977 kg)  BMI 29.29 kg/m2  SpO2 98%  Physical Exam:  Well appearing NAD HEENT: Unremarkable Neck:  No JVD, no thyromegally Lungs:  Clear with no wheezes, rales, or rhonchi. Well-healed pacemaker incision. HEART:  Regular rate rhythm, no murmurs, no rubs, no clicks Abd:  soft, positive bowel sounds, no organomegally, no rebound, no guarding Ext:  2 plus pulses, no edema, no cyanosis, no clubbing Skin:  No rashes no nodules Neuro:  CN II through XII intact, motor grossly intact  DEVICE  Normal device function.  See PaceArt for details.   Assess/Plan:

## 2012-05-13 NOTE — Patient Instructions (Signed)
Your physician wants you to follow-up in:  6 months in device clinic12 months with Dr Court Joy will receive a reminder letter in the mail two months in advance. If you don't receive a letter, please call our office to schedule the follow-up appointment.

## 2012-05-13 NOTE — Addendum Note (Signed)
Addended by: Alfred Levins D on: 05/13/2012 10:33 AM   Modules accepted: Orders

## 2012-05-13 NOTE — Assessment & Plan Note (Signed)
The patient's pacemaker is working normally. His programming is set such that he most which is at 150 beats per minute. He has underlying complete heart block. His upper tracking rate was at 130 beats per minute. My suspicion is that at least some of his symptoms are related to pacemaker week he block. Today we have reprogrammed his mode switch rate to 200 beats per minute, and we have also increased his upper tracking rate 160 beats per minute. His sensor rate is increased 150 beats per minute. Hopefully these programming changes will improve his symptoms. If not, I would have him undergo regular exercise treadmill testing.

## 2012-05-13 NOTE — Assessment & Plan Note (Signed)
His blood pressure is elevated today. He is instructed to reduce his sodium intake. He may ultimately require additional medical therapy.

## 2012-06-08 ENCOUNTER — Other Ambulatory Visit (INDEPENDENT_AMBULATORY_CARE_PROVIDER_SITE_OTHER): Payer: BC Managed Care – PPO

## 2012-06-08 DIAGNOSIS — Z0000 Encounter for general adult medical examination without abnormal findings: Secondary | ICD-10-CM

## 2012-06-08 LAB — POCT URINALYSIS DIPSTICK
Bilirubin, UA: NEGATIVE
Ketones, UA: NEGATIVE
Leukocytes, UA: NEGATIVE
Protein, UA: NEGATIVE
Spec Grav, UA: >=1.03

## 2012-06-08 LAB — PSA: PSA: 2.68 ng/mL (ref 0.10–4.00)

## 2012-06-08 LAB — CBC WITH DIFFERENTIAL/PLATELET
Basophils Relative: 0.5 % (ref 0.0–3.0)
Eosinophils Absolute: 0.1 10*3/uL (ref 0.0–0.7)
Lymphs Abs: 1.4 10*3/uL (ref 0.7–4.0)
MCHC: 32.9 g/dL (ref 30.0–36.0)
MCV: 89.1 fl (ref 78.0–100.0)
Monocytes Absolute: 0.4 10*3/uL (ref 0.1–1.0)
Neutrophils Relative %: 71 % (ref 43.0–77.0)
Platelets: 200 10*3/uL (ref 150.0–400.0)

## 2012-06-08 LAB — HEPATIC FUNCTION PANEL
Alkaline Phosphatase: 42 U/L (ref 39–117)
Bilirubin, Direct: 0.1 mg/dL (ref 0.0–0.3)
Total Bilirubin: 0.5 mg/dL (ref 0.3–1.2)

## 2012-06-08 LAB — BASIC METABOLIC PANEL
BUN: 29 mg/dL — ABNORMAL HIGH (ref 6–23)
Calcium: 8.8 mg/dL (ref 8.4–10.5)
GFR: 61.86 mL/min (ref >60.00)
Potassium: 4.6 mEq/L (ref 3.5–5.1)
Sodium: 138 mEq/L (ref 135–145)

## 2012-06-08 LAB — TSH: TSH: 2.73 u[IU]/mL (ref 0.35–5.50)

## 2012-06-08 LAB — LIPID PANEL
Cholesterol: 157 mg/dL (ref 0–200)
VLDL: 18.4 mg/dL (ref 0.0–40.0)

## 2012-06-15 ENCOUNTER — Encounter: Payer: BC Managed Care – PPO | Admitting: Internal Medicine

## 2012-06-16 ENCOUNTER — Ambulatory Visit (INDEPENDENT_AMBULATORY_CARE_PROVIDER_SITE_OTHER): Payer: BC Managed Care – PPO | Admitting: Internal Medicine

## 2012-06-16 ENCOUNTER — Encounter: Payer: Self-pay | Admitting: Internal Medicine

## 2012-06-16 VITALS — BP 196/104 | HR 76 | Temp 98.0°F | Ht 72.0 in | Wt 217.0 lb

## 2012-06-16 DIAGNOSIS — E785 Hyperlipidemia, unspecified: Secondary | ICD-10-CM

## 2012-06-16 DIAGNOSIS — Z0000 Encounter for general adult medical examination without abnormal findings: Secondary | ICD-10-CM

## 2012-06-16 DIAGNOSIS — I10 Essential (primary) hypertension: Secondary | ICD-10-CM

## 2012-06-16 MED ORDER — SIMVASTATIN 20 MG PO TABS: 20.0000 mg | ORAL_TABLET | Freq: Every day | ORAL | Status: DC

## 2012-06-16 NOTE — Patient Instructions (Signed)
Stop lipitor for 4 weeks and then start simvastatin Monitor BP- goal BP< 135/85

## 2012-06-16 NOTE — Progress Notes (Signed)
Patient ID: Devin Mercado, male   DOB: 1950-12-28, 61 y.o.   MRN: [id number] cpx  Has developed muscle twitching-- left side of neck (resolved) extending to left side of face. Symptoms for nearly one year.   Reviewed pmh, psh, soc hx   patient denies chest pain, shortness of breath, orthopnea. Denies lower extremity edema, abdominal pain, change in appetite, change in bowel movements. Patient denies rashes, musculoskeletal complaints. No other specific complaints in a complete review of systems.   Well-developed male in no acute distress. HEENT exam atraumatic, normocephalic, extraocular muscles are intact. Conjunctivae are pink without exudate. Neck is supple without lymphadenopathy, thyromegaly, jugular venous distention. Chest is clear to auscultation without increased work of breathing. Cardiac exam S1-S2 are regular. The PMI is normal. No significant murmurs or gallops. Abdominal exam active bowel sounds, soft, nontender. No abdominal bruits. Extremities no clubbing cyanosis or edema. Peripheral pulses are normal without bruits. Neurologic exam alert and oriented without any motor or sensory deficits. Rectal exam normal tone prostate normal size without masses or asymmetry.  A/p: well visit, health maint UTD

## 2012-06-17 NOTE — Assessment & Plan Note (Signed)
He will monitor at home goall bp <140/90

## 2013-01-31 ENCOUNTER — Encounter: Payer: Self-pay | Admitting: Internal Medicine

## 2013-02-02 ENCOUNTER — Encounter: Payer: Self-pay | Admitting: *Deleted

## 2013-05-13 ENCOUNTER — Other Ambulatory Visit (INDEPENDENT_AMBULATORY_CARE_PROVIDER_SITE_OTHER): Payer: BC Managed Care – PPO

## 2013-05-13 DIAGNOSIS — Z23 Encounter for immunization: Secondary | ICD-10-CM

## 2013-05-13 DIAGNOSIS — Z0000 Encounter for general adult medical examination without abnormal findings: Secondary | ICD-10-CM

## 2013-05-13 LAB — CBC WITH DIFFERENTIAL/PLATELET
Eosinophils Absolute: 0.1 10*3/uL (ref 0.0–0.7)
Eosinophils Relative: 2.7 % (ref 0.0–5.0)
HCT: 43.3 % (ref 39.0–52.0)
Lymphs Abs: 0.9 10*3/uL (ref 0.7–4.0)
MCHC: 33.7 g/dL (ref 30.0–36.0)
MCV: 86.9 fl (ref 78.0–100.0)
Monocytes Absolute: 0.6 10*3/uL (ref 0.1–1.0)
Neutrophils Relative %: 68.8 % (ref 43.0–77.0)
Platelets: 190 10*3/uL (ref 150.0–400.0)

## 2013-05-13 LAB — HEPATIC FUNCTION PANEL
ALT: 17 U/L (ref 0–53)
Bilirubin, Direct: 0.1 mg/dL (ref 0.0–0.3)
Total Bilirubin: 1 mg/dL (ref 0.3–1.2)
Total Protein: 6.7 g/dL (ref 6.0–8.3)

## 2013-05-13 LAB — POCT URINALYSIS DIPSTICK
Bilirubin, UA: NEGATIVE
Glucose, UA: NEGATIVE
Leukocytes, UA: NEGATIVE
Nitrite, UA: NEGATIVE
Urobilinogen, UA: 0.2
pH, UA: 5.5

## 2013-05-13 LAB — BASIC METABOLIC PANEL
BUN: 27 mg/dL — ABNORMAL HIGH (ref 6–23)
CO2: 28 mEq/L (ref 19–32)
Chloride: 102 mEq/L (ref 96–112)
Creatinine, Ser: 1.3 mg/dL (ref 0.4–1.5)
Glucose, Bld: 101 mg/dL — ABNORMAL HIGH (ref 70–99)
Potassium: 4.7 mEq/L (ref 3.5–5.1)

## 2013-05-13 LAB — LDL CHOLESTEROL, DIRECT: Direct LDL: 168.8 mg/dL

## 2013-05-13 LAB — LIPID PANEL: HDL: 46.6 mg/dL (ref >39.00)

## 2013-05-13 LAB — TSH: TSH: 2.94 u[IU]/mL (ref 0.35–5.50)

## 2013-05-13 LAB — PSA: PSA: 3.21 ng/mL (ref 0.10–4.00)

## 2013-06-10 ENCOUNTER — Encounter: Payer: Self-pay | Admitting: Internal Medicine

## 2013-06-10 ENCOUNTER — Ambulatory Visit (INDEPENDENT_AMBULATORY_CARE_PROVIDER_SITE_OTHER): Payer: BC Managed Care – PPO | Admitting: Internal Medicine

## 2013-06-10 VITALS — BP 142/90 | HR 75 | Temp 97.8°F | Resp 20 | Ht 71.5 in | Wt 219.0 lb

## 2013-06-10 DIAGNOSIS — Z2911 Encounter for prophylactic immunotherapy for respiratory syncytial virus (RSV): Secondary | ICD-10-CM

## 2013-06-10 DIAGNOSIS — Z23 Encounter for immunization: Secondary | ICD-10-CM

## 2013-06-10 DIAGNOSIS — Z0000 Encounter for general adult medical examination without abnormal findings: Secondary | ICD-10-CM

## 2013-06-10 DIAGNOSIS — E785 Hyperlipidemia, unspecified: Secondary | ICD-10-CM

## 2013-06-10 DIAGNOSIS — I10 Essential (primary) hypertension: Secondary | ICD-10-CM

## 2013-06-10 MED ORDER — LISINOPRIL 10 MG PO TABS: 10.0000 mg | ORAL_TABLET | Freq: Every day | ORAL | Status: DC

## 2013-06-10 MED ORDER — ATORVASTATIN CALCIUM 20 MG PO TABS: 20.0000 mg | ORAL_TABLET | Freq: Every day | ORAL | Status: DC

## 2013-06-10 MED ORDER — SIMVASTATIN 20 MG PO TABS: 20.0000 mg | ORAL_TABLET | Freq: Every day | ORAL | Status: DC

## 2013-06-10 NOTE — Addendum Note (Signed)
Addended by: Azucena Freed on: 06/10/2013 04:07 PM   Modules accepted: Orders

## 2013-06-10 NOTE — Progress Notes (Signed)
Pre-visit discussion using our clinic review tool. No additional management support is needed unless otherwise documented below in the visit note.  

## 2013-06-10 NOTE — Progress Notes (Signed)
cpx  Past Medical History  Diagnosis Date  . CHB (complete heart block)   . HTN (hypertension)   . Hyperlipidemia     History   Social History  . Marital Status: Married    Spouse Name: N/A    Number of Children: 4  . Years of Education: N/A   Occupational History  . Not on file.   Social History Main Topics  . Smoking status: Never Smoker   . Smokeless tobacco: Not on file  . Alcohol Use: Yes  . Drug Use: Not on file  . Sexual Activity: Not on file   Other Topics Concern  . Not on file   Social History Narrative  . No narrative on file    Past Surgical History  Procedure Laterality Date  . Permanent pacemaker      Family History  Problem Relation Age of Onset  . Stroke Mother     multiple  . Heart disease Mother   . Prostate cancer Father     No Known Allergies  Current Outpatient Prescriptions on File Prior to Visit  Medication Sig Dispense Refill  . aspirin 81 MG tablet Take 81 mg by mouth daily.         No current facility-administered medications on file prior to visit.     patient denies chest pain, shortness of breath, orthopnea. Denies lower extremity edema, abdominal pain, change in appetite, change in bowel movements. Patient denies rashes, musculoskeletal complaints. No other specific complaints in a complete review of systems.   BP 142/90  Pulse 75  Temp(Src) 97.8 F (36.6 C) (Oral)  Resp 20  Ht 5' 11.5" (1.816 m)  Wt 219 lb (99.338 kg)  BMI 30.12 kg/m2  SpO2 98% Well-developed male in no acute distress. HEENT exam atraumatic, normocephalic, extraocular muscles are intact. Conjunctivae are pink without exudate. Neck is supple without lymphadenopathy, thyromegaly, jugular venous distention. Chest is clear to auscultation without increased work of breathing. Cardiac exam S1-S2 are regular. The PMI is normal. No significant murmurs or gallops. Abdominal exam active bowel sounds, soft, nontender. No abdominal bruits. Extremities no clubbing  cyanosis or edema. Peripheral pulses are normal without bruits. Neurologic exam alert and oriented without any motor or sensory deficits. Rectal exam normal tone prostate normal size without masses or asymmetry.   Well Visit- health maint UTD Schedule colonoscopy zostavax

## 2013-07-05 ENCOUNTER — Telehealth: Payer: Self-pay | Admitting: Internal Medicine

## 2013-07-26 ENCOUNTER — Other Ambulatory Visit: Payer: Self-pay | Admitting: *Deleted

## 2013-07-26 ENCOUNTER — Encounter: Payer: Self-pay | Admitting: Internal Medicine

## 2013-07-26 ENCOUNTER — Ambulatory Visit (INDEPENDENT_AMBULATORY_CARE_PROVIDER_SITE_OTHER): Payer: BC Managed Care – PPO | Admitting: Cardiology

## 2013-07-26 ENCOUNTER — Encounter: Payer: Self-pay | Admitting: Cardiology

## 2013-07-26 VITALS — BP 140/82 | HR 76 | Ht 71.0 in | Wt 227.0 lb

## 2013-07-26 DIAGNOSIS — I442 Atrioventricular block, complete: Secondary | ICD-10-CM

## 2013-07-26 DIAGNOSIS — Z950 Presence of cardiac pacemaker: Secondary | ICD-10-CM

## 2013-07-26 LAB — MDC_IDC_ENUM_SESS_TYPE_INCLINIC
Brady Statistic AP VS Percent: 0 %
Brady Statistic AS VP Percent: 95 %
Brady Statistic AS VS Percent: 0 %
Lead Channel Impedance Value: 617 Ohm
Lead Channel Pacing Threshold Amplitude: 1 V
Lead Channel Pacing Threshold Amplitude: 1 V
Lead Channel Pacing Threshold Pulse Width: 0.4 ms
Lead Channel Pacing Threshold Pulse Width: 0.4 ms
Lead Channel Sensing Intrinsic Amplitude: 5.6 mV
MDC IDC MSMT BATTERY IMPEDANCE: 158 Ohm
MDC IDC MSMT BATTERY REMAINING LONGEVITY: 125 mo
MDC IDC MSMT BATTERY VOLTAGE: 2.78 V
MDC IDC MSMT LEADCHNL RA IMPEDANCE VALUE: 388 Ohm
MDC IDC SESS DTM: 20150113142909
MDC IDC SET LEADCHNL RA PACING AMPLITUDE: 2 V
MDC IDC SET LEADCHNL RV PACING AMPLITUDE: 2.5 V
MDC IDC SET LEADCHNL RV PACING PULSEWIDTH: 0.4 ms
MDC IDC SET LEADCHNL RV SENSING SENSITIVITY: 2.8 mV
MDC IDC STAT BRADY AP VP PERCENT: 5 %

## 2013-07-26 MED ORDER — AMOXICILLIN 500 MG PO CAPS
500.0000 mg | ORAL_CAPSULE | Freq: Three times a day (TID) | ORAL | Status: DC
Start: 2013-07-26 — End: 2014-01-25

## 2013-07-26 NOTE — Patient Instructions (Addendum)
Your physician wants you to follow-up in: 1 year with Dr. Knox Saliva will receive a reminder letter in the mail two months in advance. If you don't receive a letter, please call our office to schedule the follow-up appointment.  Your physician recommends that you keep your scheduled  follow-up appointment with [facility] 01/25/14 at [place] recommends that you continue on your current medications as directed. Please refer to the Current Medication list given to you today.

## 2013-07-28 NOTE — Progress Notes (Signed)
Patient ID: Devin Mercado MRN: [id number], DOB/AGE: 04-02-1951   Date of Visit: 07/26/2013  Primary Physician: Chancy Hurter, MD Primary Cardiologist: Cristopher Peru, MD Reason for Visit: EP/device follow-up  History of Present Illness  Devin Mercado is a 63 y.o. male with CHB s/p PPM implant and HTN who presents today for routine electrophysiology followup. Since last being seen in our clinic, he reports he is doing well and has no complaints. He denies chest pain or shortness of breath. He denies palpitations, dizziness, near syncope or syncope. He denies LE swelling, orthopnea or PND. He is compliant and tolerating medications without difficulty.  Past Medical History Past Medical History  Diagnosis Date  . CHB (complete heart block)   . HTN (hypertension)   . Hyperlipidemia     Past Surgical History Past Surgical History  Procedure Laterality Date  . Permanent pacemaker      Allergies/Intolerances No Known Allergies  Current Home Medications Current Outpatient Prescriptions  Medication Sig Dispense Refill  . aspirin 81 MG tablet Take 81 mg by mouth daily.        Marland Kitchen atorvastatin (LIPITOR) 20 MG tablet Take 1 tablet (20 mg total) by mouth daily.  90 tablet  3  . lisinopril (PRINIVIL,ZESTRIL) 10 MG tablet Take 1 tablet (10 mg total) by mouth daily.  90 tablet  3  . amoxicillin (AMOXIL) 500 MG capsule Take 1 capsule (500 mg total) by mouth 3 (three) times daily.  21 capsule  0   No current facility-administered medications for this visit.    Social History History   Social History  . Marital Status: Married    Spouse Name: N/A    Number of Children: 4  . Years of Education: N/A   Occupational History  . Not on file.   Social History Main Topics  . Smoking status: Never Smoker   . Smokeless tobacco: Not on file  . Alcohol Use: Yes  . Drug Use: Not on file  . Sexual Activity: Not on file   Other Topics Concern  . Not on file   Social History Narrative    . No narrative on file     Review of Systems General: No chills, fever, night sweats or weight changes Cardiovascular: No chest pain, dyspnea on exertion, edema, orthopnea, palpitations, paroxysmal nocturnal dyspnea Dermatological: No rash, lesions or masses Respiratory: No cough, dyspnea Urologic: No hematuria, dysuria Abdominal: No nausea, vomiting, diarrhea, bright red blood per rectum, melena, or hematemesis Neurologic: No visual changes, weakness, changes in mental status All other systems reviewed and are otherwise negative except as noted above.  Physical Exam Vitals: Blood pressure 140/82, pulse 76, height 5\' 11"  (1.803 m), weight 227 lb (102.967 kg).  General: Well developed, well appearing 63 y.o. male in no acute distress. HEENT: Normocephalic, atraumatic. EOMs intact. Sclera nonicteric. Oropharynx clear.  Neck: Supple. No JVD. Lungs: Respirations regular and unlabored, CTA bilaterally. No wheezes, rales or rhonchi. Heart: RRR. S1, S2 present. No murmurs, rub, S3 or S4. Abdomen: Soft, non-distended.  Extremities: No clubbing, cyanosis or edema. PT/Radials 2+ and equal bilaterally. Psych: Normal affect. Neuro: Alert and oriented X 3. Moves all extremities spontaneously.   Diagnostics Device interrogation today - normal PPM functon with good battery status and stable lead measurements; remaining longevity 10.5 years; V paced 100%; no atrial high rate episodes; 64 VHR episodes, EGMs reviewed and appear as sinus tachycardia with V pacing / tracking at upper rate limit (patient exercises regularly); no evidence of ventricular arrhythmias; histograms  appropriate; no programming changes made; see PaceArt report for full details   Assessment and Plan 1. CHB s/p PPM implant - normal device function - no programming changes made - return to device clinic in 6 months - return for follow-up with Dr. Lovena Le in one year  Signed, Ileene Hutchinson, PA-C 07/28/2013, 12:33 AM

## 2013-08-23 ENCOUNTER — Encounter: Payer: Self-pay | Admitting: Internal Medicine

## 2013-09-07 DIAGNOSIS — H6980 Other specified disorders of Eustachian tube, unspecified ear: Secondary | ICD-10-CM | POA: Diagnosis not present

## 2013-09-07 DIAGNOSIS — J343 Hypertrophy of nasal turbinates: Secondary | ICD-10-CM | POA: Diagnosis not present

## 2013-09-07 DIAGNOSIS — J310 Chronic rhinitis: Secondary | ICD-10-CM | POA: Diagnosis not present

## 2013-09-07 DIAGNOSIS — H903 Sensorineural hearing loss, bilateral: Secondary | ICD-10-CM | POA: Diagnosis not present

## 2014-01-25 ENCOUNTER — Ambulatory Visit (INDEPENDENT_AMBULATORY_CARE_PROVIDER_SITE_OTHER): Payer: Medicare Other | Admitting: *Deleted

## 2014-01-25 DIAGNOSIS — I442 Atrioventricular block, complete: Secondary | ICD-10-CM | POA: Diagnosis not present

## 2014-01-25 LAB — MDC_IDC_ENUM_SESS_TYPE_INCLINIC
Battery Impedance: 182 Ohm
Brady Statistic AP VP Percent: 5 %
Brady Statistic AS VP Percent: 95 %
Brady Statistic AS VS Percent: 0 %
Date Time Interrogation Session: 20150715091753
Lead Channel Impedance Value: 379 Ohm
Lead Channel Pacing Threshold Pulse Width: 0.4 ms
Lead Channel Sensing Intrinsic Amplitude: 4 mV
Lead Channel Setting Pacing Amplitude: 2.5 V
Lead Channel Setting Sensing Sensitivity: 2.8 mV
MDC IDC MSMT BATTERY REMAINING LONGEVITY: 121 mo
MDC IDC MSMT BATTERY VOLTAGE: 2.78 V
MDC IDC MSMT LEADCHNL RA PACING THRESHOLD AMPLITUDE: 1 V
MDC IDC MSMT LEADCHNL RV IMPEDANCE VALUE: 661 Ohm
MDC IDC MSMT LEADCHNL RV PACING THRESHOLD AMPLITUDE: 1 V
MDC IDC MSMT LEADCHNL RV PACING THRESHOLD PULSEWIDTH: 0.4 ms
MDC IDC SET LEADCHNL RA PACING AMPLITUDE: 2.25 V
MDC IDC SET LEADCHNL RV PACING PULSEWIDTH: 0.4 ms
MDC IDC STAT BRADY AP VS PERCENT: 0 %

## 2014-01-25 NOTE — Progress Notes (Signed)
Pacemaker check in clinic. Normal device function. Thresholds, sensing, impedances consistent with previous measurements. Device programmed to maximize longevity. 1 mode switch lasting less than 1 minute. 5 high ventricular rates noted--longest was 22 beats. V rate 213 bpm. Device programmed at appropriate safety margins. Histogram distribution appropriate for patient activity level. Device programmed to optimize intrinsic conduction. Estimated longevity 10 years. ROV in 6 mths w/GT.

## 2014-02-24 NOTE — Telephone Encounter (Signed)
error 

## 2014-03-08 ENCOUNTER — Encounter: Payer: Self-pay | Admitting: Internal Medicine

## 2014-05-05 ENCOUNTER — Ambulatory Visit (INDEPENDENT_AMBULATORY_CARE_PROVIDER_SITE_OTHER): Payer: Medicare Other | Admitting: *Deleted

## 2014-05-05 DIAGNOSIS — M1612 Unilateral primary osteoarthritis, left hip: Secondary | ICD-10-CM | POA: Diagnosis not present

## 2014-05-05 DIAGNOSIS — Z23 Encounter for immunization: Secondary | ICD-10-CM | POA: Diagnosis not present

## 2014-05-29 ENCOUNTER — Ambulatory Visit (INDEPENDENT_AMBULATORY_CARE_PROVIDER_SITE_OTHER): Payer: Medicare Other | Admitting: Family Medicine

## 2014-05-29 ENCOUNTER — Encounter: Payer: Self-pay | Admitting: Family Medicine

## 2014-05-29 VITALS — BP 144/82 | HR 74 | Temp 98.3°F | Wt 224.0 lb

## 2014-05-29 DIAGNOSIS — Z1211 Encounter for screening for malignant neoplasm of colon: Secondary | ICD-10-CM

## 2014-05-29 DIAGNOSIS — N401 Enlarged prostate with lower urinary tract symptoms: Secondary | ICD-10-CM | POA: Diagnosis not present

## 2014-05-29 DIAGNOSIS — E785 Hyperlipidemia, unspecified: Secondary | ICD-10-CM | POA: Diagnosis not present

## 2014-05-29 DIAGNOSIS — R351 Nocturia: Secondary | ICD-10-CM

## 2014-05-29 DIAGNOSIS — Z23 Encounter for immunization: Secondary | ICD-10-CM | POA: Diagnosis not present

## 2014-05-29 DIAGNOSIS — I10 Essential (primary) hypertension: Secondary | ICD-10-CM

## 2014-05-29 LAB — POCT URINALYSIS DIPSTICK
Bilirubin, UA: NEGATIVE
Blood, UA: NEGATIVE
GLUCOSE UA: NEGATIVE
KETONES UA: NEGATIVE
Leukocytes, UA: NEGATIVE
Nitrite, UA: NEGATIVE
Urobilinogen, UA: 0.2
pH, UA: 5.5

## 2014-05-29 LAB — CBC
HCT: 44.9 % (ref 39.0–52.0)
HEMOGLOBIN: 14.9 g/dL (ref 13.0–17.0)
MCHC: 33.3 g/dL (ref 30.0–36.0)
MCV: 87.4 fl (ref 78.0–100.0)
Platelets: 223 10*3/uL (ref 150.0–400.0)
RBC: 5.14 Mil/uL (ref 4.22–5.81)
RDW: 13.7 % (ref 11.5–15.5)
WBC: 5.8 10*3/uL (ref 4.0–10.5)

## 2014-05-29 MED ORDER — LISINOPRIL 10 MG PO TABS: 10.0000 mg | ORAL_TABLET | Freq: Every day | ORAL | Status: DC

## 2014-05-29 MED ORDER — ATORVASTATIN CALCIUM 20 MG PO TABS: 20.0000 mg | ORAL_TABLET | Freq: Every day | ORAL | Status: DC

## 2014-05-29 NOTE — Assessment & Plan Note (Signed)
Mild poor control. Restart lisinopril 10 mg. want systolic below 119 before surgery. Patient has follow-up next week with Dr. Lovena Le which will allow [place] to assess control. Patient can participate in 4 METS without chest pain or shortness of breath.  I believe patient is medically maximized for surgery as long as he is compliant with medications. I sent these recommendations to his orthopedist with the final Clearence needing to come from cardiology due to his pacemaker.

## 2014-05-29 NOTE — Progress Notes (Signed)
Devin Reddish, MD Phone: [phone]  Subjective:  Patient presents today to establish care with me as their new primary care provider and plan for physical intially listed (I am not trained for initial medicare evaluation (required forms)) and we ultimately discussed hypertension and hyperlipidemia as below. Patient was formerly a patient of Dr. Leanne Chang. Chief complaint-noted.   Hypertension Hyperlipidemia Preoperative evaluation for hip replacement Has not taken any medications in 6 months. Very busy and never had time to get them refilled.  Unable to exercise due to hip. Has upcoming hip replacement in mid December and out of work planned 6-8 weeks. Patient asks for medical optimization/clerance. He has a pacemaker and has upcoming evaluation with Dr. Lovena Le before surgery.  No chest pain/ shortness of breath going up flight of stairs Recommended 2 a night alcohol maximum Home BP monitoring-no Compliant with medications-no, not taking any medication ROS-Denies any CP, HA, SOB, blurry vision, LE edema. peeing 1-2x per night, occasional urgency   Other updates Out of work for hip replacement and requests colonoscopy. Plan for End of January colonoscopy No personal history skin cancer. Sunscreen used.   The following were reviewed and entered/updated in epic: Past Medical History  Diagnosis Date  . CHB (complete heart block)   . HTN (hypertension)   . Hyperlipidemia    Patient Active Problem List   Diagnosis Date Noted  . PPM-Medtronic 05/23/2010    Priority: High  . Atrioventricular block, complete 03/28/2009    Priority: High  . Hyperlipidemia 03/28/2009    Priority: Medium  . Essential hypertension 03/16/2008    Priority: Medium   Past Surgical History  Procedure Laterality Date  . Permanent pacemaker      Family History  Problem Relation Age of Onset  . Stroke Mother     multiple. 86.   . Heart disease Mother     hx heart valve problem  . Prostate cancer  Father     17 of prostate cancer    Medications- reviewed and updated Current Outpatient Prescriptions  Medication Sig Dispense Refill  . aspirin 81 MG tablet Take 81 mg by mouth daily.      Marland Kitchen atorvastatin (LIPITOR) 20 MG tablet Take 1 tablet (20 mg total) by mouth daily. 90 tablet 3  . lisinopril (PRINIVIL,ZESTRIL) 10 MG tablet Take 1 tablet (10 mg total) by mouth daily. 90 tablet 3   No current facility-administered medications for this visit.    Allergies-reviewed and updated No Known Allergies  History   Social History  . Marital Status: Married    Spouse Name: N/A    Number of Children: 4  . Years of Education: N/A   Social History Main Topics  . Smoking status: Never Smoker   . Smokeless tobacco: None  . Alcohol Use: 1.2 - 1.8 oz/week    2-3 Not specified per week  . Drug Use: No  . Sexual Activity: None   Other Topics Concern  . None   Social History Narrative   Married to Dr. [name] 1979. 4 kids (1 lost to auto accident-3 surviving), no grandkids.       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 Oropharynx normal. Good dentition.  Neck: no thyromegaly CV: RRR  no murmurs rubs or gallops Lungs: CTAB no crackles, wheeze, rhonchi Abdomen: soft/nontender/nondistended/normal bowel sounds. No rebound or guarding.  Ext: no edema Rectal: mild enlargement of prostate with nodules Skin: warm, dry, upper body skin exam-large skin tag behind right axilla, Left low back 4 mm symmetrical brown flat lesion (follow), left upper chest (appears to be small retained hematoma after pacemaker surgery).  Neuro: grossly normal, moves all extremities, PERRLA   Assessment/Plan:  1 year follow up planned. Mychart or phone message with blood work results.    Hyperlipidemia Suspect Poor control. Check labs today as off of statin. Plan will be to restart atorvastatin and continue at a minimum through the next 3 months (emphasized this) to allow patient to get through high risk period of surgery at a minimum. Advised patient importance of compliance ongoing but that highest risk would certainly be around surgery.   Essential hypertension Mild poor control. Restart lisinopril 10 mg. want systolic below 277 before surgery. Patient has follow-up next week with Dr. Lovena Le which will allow [place] to assess control. Patient can participate in 4 METS without chest pain or shortness of breath.  I believe patient is medically maximized for surgery as long as he is compliant with medications. I sent these recommendations to his orthopedist with the final Clearence needing to come from cardiology due to his pacemaker.    63 y.o. male presenting for annual physical.  Health Maintenance counseling: 1. Anticipatory guidance: Patient counseled regarding regular dental exams, wearing seatbelts, regular eye exams.  2. Risk factor reduction:  Advised patient of need for regular exercise and diet rich and fruits and vegetables to reduce risk of heart attack and stroke.  3. Immunizations/screenings/ancillary studies Health Maintenance Due  Topic Date Due  . COLONOSCOPY -schedule late january 09/12/1998  . PNEUMOCOCCAL POLYSACCHARIDE VACCINE -prevnar 09/11/2013   Return precautions advised.   Orders Placed This Encounter  Procedures  . Pneumococcal conjugate vaccine 13-valent  . PSA  . CBC    [place]  . Comprehensive metabolic panel    [place]    Order Specific Question:  Has the patient fasted?    Answer:  No  . Lipid panel    [place]    Order Specific Question:  Has the patient fasted?    Answer:  No  . TSH    [place]  . Ambulatory referral to Gastroenterology    Referral Priority:  Routine    Referral Type:  Consultation    Referral Reason:  Specialty  Services Required    Requested Specialty:  Gastroenterology    Number of Visits Requested:  1  . POCT urinalysis dipstick    Meds ordered this encounter  Medications  . lisinopril (PRINIVIL,ZESTRIL) 10 MG tablet    Sig: Take 1 tablet (10 mg total) by mouth daily.    Dispense:  90 tablet    Refill:  3  . atorvastatin (LIPITOR) 20 MG tablet    Sig: Take 1 tablet (20 mg total) by mouth daily.    Dispense:  90 tablet    Refill:  3

## 2014-05-29 NOTE — Patient Instructions (Addendum)
Update Labs.   Restart lisinopril. Blood pressure recheck with Dr. Lovena Le. Goal <150 systolic before surgery.   Restart atorvastatin (  63 y.o. male presenting for annual physical.  Health Maintenance counseling: 1. Anticipatory guidance: Patient counseled regarding regular dental exams, wearing seatbelts, regular eye exams.  2. Risk factor reduction:  Advised patient of need for regular exercise and diet rich and fruits and vegetables to reduce risk of heart attack and stroke.  3. Immunizations/screenings/ancillary studies Health Maintenance Due  Topic Date Due  . COLONOSCOPY -schedule late january 09/12/1998  . PNEUMOCOCCAL POLYSACCHARIDE VACCINE -prevnar 09/11/2013

## 2014-05-29 NOTE — Assessment & Plan Note (Addendum)
Suspect Poor control. Check labs today as off of statin. Plan will be to restart atorvastatin and continue at a minimum through the next 3 months (emphasized this) to allow patient to get through high risk period of surgery at a minimum. Advised patient importance of compliance ongoing but that highest risk would certainly be around surgery.

## 2014-05-30 LAB — LIPID PANEL
CHOL/HDL RATIO: 5
Cholesterol: 275 mg/dL — ABNORMAL HIGH (ref 0–200)
HDL: 52.5 mg/dL (ref >39.00)
LDL Cholesterol: 187 mg/dL — ABNORMAL HIGH (ref 0–99)
NONHDL: 222.5
Triglycerides: 177 mg/dL — ABNORMAL HIGH (ref 0.0–149.0)
VLDL: 35.4 mg/dL (ref 0.0–40.0)

## 2014-05-30 LAB — COMPREHENSIVE METABOLIC PANEL
ALBUMIN: 4.5 g/dL (ref 3.5–5.2)
ALT: 19 U/L (ref 0–53)
AST: 19 U/L (ref 0–37)
Alkaline Phosphatase: 54 U/L (ref 39–117)
BUN: 25 mg/dL — ABNORMAL HIGH (ref 6–23)
CALCIUM: 8.7 mg/dL (ref 8.4–10.5)
CHLORIDE: 108 meq/L (ref 96–112)
CO2: 21 meq/L (ref 19–32)
CREATININE: 1.3 mg/dL (ref 0.4–1.5)
GFR: 58.24 mL/min — AB (ref >60.00)
Glucose, Bld: 95 mg/dL (ref 70–99)
POTASSIUM: 4.4 meq/L (ref 3.5–5.1)
Sodium: 140 mEq/L (ref 135–145)
TOTAL PROTEIN: 7.2 g/dL (ref 6.0–8.3)
Total Bilirubin: 1 mg/dL (ref 0.2–1.2)

## 2014-05-30 LAB — TSH: TSH: 3.07 u[IU]/mL (ref 0.35–4.50)

## 2014-05-30 LAB — PSA: PSA: 5.53 ng/mL — AB (ref 0.10–4.00)

## 2014-06-01 ENCOUNTER — Telehealth: Payer: Self-pay | Admitting: Family Medicine

## 2014-06-01 DIAGNOSIS — R351 Nocturia: Secondary | ICD-10-CM

## 2014-06-01 DIAGNOSIS — R972 Elevated prostate specific antigen [PSA]: Secondary | ICD-10-CM

## 2014-06-01 DIAGNOSIS — N401 Enlarged prostate with lower urinary tract symptoms: Secondary | ICD-10-CM

## 2014-06-01 MED ORDER — CIPROFLOXACIN HCL 500 MG PO TABS: 500.0000 mg | ORAL_TABLET | Freq: Two times a day (BID) | ORAL | Status: DC

## 2014-06-01 NOTE — Telephone Encounter (Signed)
Discussed elevated PSA to >5 and doubling over 2 years. Patient opts to treat as prostatitis (vs. Urology referral or repeat without antibiotics in 2 weeks for potential false elevation).   Cipro x 21 days with repeat PSA 2 weeks later.

## 2014-06-07 ENCOUNTER — Encounter: Payer: Self-pay | Admitting: Internal Medicine

## 2014-06-07 ENCOUNTER — Ambulatory Visit (INDEPENDENT_AMBULATORY_CARE_PROVIDER_SITE_OTHER): Payer: Medicare Other | Admitting: Internal Medicine

## 2014-06-07 VITALS — BP 158/96 | HR 80 | Ht 72.0 in | Wt 225.6 lb

## 2014-06-07 DIAGNOSIS — Z950 Presence of cardiac pacemaker: Secondary | ICD-10-CM | POA: Diagnosis not present

## 2014-06-07 DIAGNOSIS — I442 Atrioventricular block, complete: Secondary | ICD-10-CM

## 2014-06-07 DIAGNOSIS — Z01810 Encounter for preprocedural cardiovascular examination: Secondary | ICD-10-CM | POA: Diagnosis not present

## 2014-06-07 DIAGNOSIS — E785 Hyperlipidemia, unspecified: Secondary | ICD-10-CM | POA: Diagnosis not present

## 2014-06-07 DIAGNOSIS — I10 Essential (primary) hypertension: Secondary | ICD-10-CM | POA: Diagnosis not present

## 2014-06-07 LAB — MDC_IDC_ENUM_SESS_TYPE_INCLINIC
Brady Statistic AP VS Percent: 0 %
Brady Statistic AS VS Percent: 0 %
Date Time Interrogation Session: 20151125143654
Lead Channel Impedance Value: 678 Ohm
Lead Channel Pacing Threshold Amplitude: 1 V
Lead Channel Pacing Threshold Amplitude: 1.25 V
Lead Channel Pacing Threshold Pulse Width: 0.4 ms
Lead Channel Pacing Threshold Pulse Width: 0.4 ms
Lead Channel Setting Pacing Pulse Width: 0.4 ms
MDC IDC MSMT BATTERY IMPEDANCE: 182 Ohm
MDC IDC MSMT BATTERY REMAINING LONGEVITY: 121 mo
MDC IDC MSMT BATTERY VOLTAGE: 2.79 V
MDC IDC MSMT LEADCHNL RA IMPEDANCE VALUE: 374 Ohm
MDC IDC MSMT LEADCHNL RA SENSING INTR AMPL: 4 mV
MDC IDC SET LEADCHNL RA PACING AMPLITUDE: 2 V
MDC IDC SET LEADCHNL RV PACING AMPLITUDE: 2.5 V
MDC IDC SET LEADCHNL RV SENSING SENSITIVITY: 2.8 mV
MDC IDC STAT BRADY AP VP PERCENT: 5 %
MDC IDC STAT BRADY AS VP PERCENT: 95 %

## 2014-06-07 NOTE — Progress Notes (Signed)
      HPI Mr. Kaser returns today for follow-up. He is a 63 year old man with a history of hypertension, dyslipidemia, and complete heart block. He is status post permanent pacemaker incision. He has developed worsening pain in his hip and is pending hip replacement surgery. He has not had chest pressure or sob. He has been more sedentary the past few months. No syncope No Known Allergies   Current Outpatient Prescriptions  Medication Sig Dispense Refill  . atorvastatin (LIPITOR) 20 MG tablet Take 1 tablet (20 mg total) by mouth daily. 90 tablet 3  . ciprofloxacin (CIPRO) 500 MG tablet Take 1 tablet (500 mg total) by mouth 2 (two) times daily. 42 tablet 0  . lisinopril (PRINIVIL,ZESTRIL) 10 MG tablet Take 1 tablet (10 mg total) by mouth daily. 90 tablet 3  . aspirin 81 MG tablet Take 81 mg by mouth daily.       No current facility-administered medications for this visit.     Past Medical History  Diagnosis Date  . CHB (complete heart block)   . HTN (hypertension)   . Hyperlipidemia     ROS:   All systems reviewed and negative except as noted in the HPI.   Past Surgical History  Procedure Laterality Date  . Permanent pacemaker       Family History  Problem Relation Age of Onset  . Stroke Mother     multiple. 7.   . Heart disease Mother     hx heart valve problem  . Prostate cancer Father     35 of prostate cancer     History   Social History  . Marital Status: Married    Spouse Name: N/A    Number of Children: 4  . Years of Education: N/A   Occupational History  . Not on file.   Social History Main Topics  . Smoking status: Never Smoker   . Smokeless tobacco: Not on file  . Alcohol Use: 1.2 - 1.8 oz/week    2-3 Not specified per week  . Drug Use: No  . Sexual Activity: Not on file   Other Topics Concern  . Not on file   Social History Narrative   Married to Dr. [name] 1979. 4 kids (1 lost to auto accident-3 surviving), no grandkids.       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[place], vacation     BP 158/96 mmHg  Pulse 80  Ht 6' (1.829 m)  Wt 225 lb 9.6 oz (102.331 kg)  BMI 30.59 kg/m2  Physical Exam:  Well appearing 63 yo man, NAD HEENT: Unremarkable Neck:  No JVD, no thyromegally Lymphatics:  No adenopathy Back:  No CVA tenderness Lungs:  Clear with no wheezes HEART:  Regular rate rhythm, no murmurs, no rubs, no clicks Abd:  soft, positive bowel sounds, no organomegally, no rebound, no guarding Ext:  2 plus pulses, no edema, no cyanosis, no clubbing Skin:  No rashes no nodules Neuro:  CN II through XII intact, motor grossly intact  EKG - NSR with pacing induced LBBB  DEVICE  Normal device function.  See PaceArt for details.   Assess/Plan:

## 2014-06-07 NOTE — Assessment & Plan Note (Signed)
His medtronic PPM is working normally. No special precautions prior to pending surgery. Will recheck in several months.

## 2014-06-07 NOTE — Patient Instructions (Signed)
Remote monitoring is used to monitor your Pacemaker of ICD from home. This monitoring reduces the number of office visits required to check your device to one time per year. It allows [place] to keep an eye on the functioning of your device to ensure it is working properly. You are scheduled for a device check from home on 09/07/14. You may send your transmission at any time that day. If you have a wireless device, the transmission will be sent automatically. After your physician reviews your transmission, you will receive a postcard with your next transmission date.  Your physician wants you to follow-up in: 1 year with Dr. Lovena Le. You will receive a reminder letter in the mail two months in advance. If you don't receive a letter, please call our office to schedule the follow-up appointment.  Dr. Lovena Le has cleared you for surgery. We will send clearance to Dr. Aurea Graff office.  Your physician recommends that you continue on your current medications as directed. Please refer to the Current Medication list given to you today.

## 2014-06-07 NOTE — Assessment & Plan Note (Signed)
His blood pressure is elevated today but he states at home it is well controlled. He will maintain a low sodium diet. He has some chronic pain which may also be contributing. I considered adjusting his medications today but will hold off on this.

## 2014-06-07 NOTE — Assessment & Plan Note (Signed)
He is an acceptable surgical risk prior to pending hip surgery.

## 2014-06-07 NOTE — Assessment & Plan Note (Signed)
He is encouraged to lose weight and maintain a low sodium diet.

## 2014-06-09 ENCOUNTER — Telehealth: Payer: Self-pay | Admitting: Internal Medicine

## 2014-06-09 NOTE — Telephone Encounter (Signed)
Received request from Nurse fax box, documents faxed for surgical clearance. To: [employer] Fax number: [phone] Attention: [phone]/km

## 2014-06-21 NOTE — H&P (Signed)
TOTAL HIP ADMISSION H&P  Patient is admitted for left total hip arthroplasty, anterior approach.  Subjective:  Chief Complaint:    Left hip primary OA / pain  HPI: Devin Mercado, 63 y.o. male, has a history of pain and functional disability in the left hip(s) due to arthritis and patient has failed non-surgical conservative treatments for greater than 12 weeks to include NSAID's and/or analgesics and activity modification.  Onset of symptoms was gradual starting 2+ years ago with gradually worsening course since that time.The patient noted no past surgery on the left hip(s).  Patient currently rates pain in the left hip at 6 out of 10 with activity. Patient has night pain, worsening of pain with activity and weight bearing, trendelenberg gait, pain that interfers with activities of daily living and pain with passive range of motion. Patient has evidence of periarticular osteophytes and joint space narrowing by imaging studies. This condition presents safety issues increasing the risk of falls.  There is no current active infection.  Risks, benefits and expectations were discussed with the patient.  Risks including but not limited to the risk of anesthesia, blood clots, nerve damage, blood vessel damage, failure of the prosthesis, infection and up to and including death.  Patient understand the risks, benefits and expectations and wishes to proceed with surgery.   PCP: Garret Reddish, MD  D/C Plans:      Home with HHPT  Post-op Meds:       No Rx given   Tranexamic Acid:      To be given - IV    Decadron:      Is to be given  FYI:     ASA  Norco      Patient Active Problem List   Diagnosis Date Noted  . Preop cardiovascular exam 06/07/2014  . PPM-Medtronic 05/23/2010  . Hyperlipidemia 03/28/2009  . Atrioventricular block, complete 03/28/2009  . Essential hypertension 03/16/2008   Past Medical History  Diagnosis Date  . CHB (complete heart block)   . HTN (hypertension)   .  Hyperlipidemia     Past Surgical History  Procedure Laterality Date  . Permanent pacemaker      No prescriptions prior to admission   No Known Allergies   History  Substance Use Topics  . Smoking status: Never Smoker   . Smokeless tobacco: Not on file  . Alcohol Use: 1.2 - 1.8 oz/week    2-3 Not specified per week    Family History  Problem Relation Age of Onset  . Stroke Mother     multiple. 55.   . Heart disease Mother     hx heart valve problem  . Prostate cancer Father     72 of prostate cancer     Review of Systems  Constitutional: Negative.   HENT: Negative.   Eyes: Negative.   Respiratory: Negative.   Cardiovascular: Negative.   Gastrointestinal: Negative.   Genitourinary: Negative.   Musculoskeletal: Positive for back pain and joint pain.  Skin: Negative.   Neurological: Negative.   Endo/Heme/Allergies: Negative.   Psychiatric/Behavioral: Negative.     Objective:  Physical Exam  Constitutional: He is oriented to person, place, and time. He appears well-developed and well-nourished.  HENT:  Head: Normocephalic and atraumatic.  Eyes: Pupils are equal, round, and reactive to light.  Neck: Neck supple. No JVD present. No tracheal deviation present. No thyromegaly present.  Cardiovascular: Normal rate, regular rhythm, normal heart sounds and intact distal pulses.   Respiratory: Effort normal and  breath sounds normal. No stridor. No respiratory distress. He has no wheezes.  GI: Soft. There is no tenderness. There is no guarding.  Musculoskeletal:       Left hip: He exhibits decreased range of motion, decreased strength, tenderness and bony tenderness. He exhibits no swelling, no deformity and no laceration.  Lymphadenopathy:    He has no cervical adenopathy.  Neurological: He is alert and oriented to person, place, and time.  Skin: Skin is warm and dry.  Psychiatric: He has a normal mood and affect.       Labs:  Estimated body mass index is 31.67  kg/(m^2) as calculated from the following:   Height as of 07/26/13: 5\' 11"  (1.803 m).   Weight as of 07/26/13: 102.967 kg (227 lb).   Imaging Review Plain radiographs demonstrate severe degenerative joint disease of the left hip(s). The bone quality appears to be good for age and reported activity level.  Assessment/Plan:  End stage arthritis, left hip(s)  The patient history, physical examination, clinical judgement of the provider and imaging studies are consistent with end stage degenerative joint disease of the left hip(s) and total hip arthroplasty is deemed medically necessary. The treatment options including medical management, injection therapy, arthroscopy and arthroplasty were discussed at length. The risks and benefits of total hip arthroplasty were presented and reviewed. The risks due to aseptic loosening, infection, stiffness, dislocation/subluxation,  thromboembolic complications and other imponderables were discussed.  The patient acknowledged the explanation, agreed to proceed with the plan and consent was signed. Patient is being admitted for inpatient treatment for surgery, pain control, PT, OT, prophylactic antibiotics, VTE prophylaxis, progressive ambulation and ADL's and discharge planning.The patient is planning to be discharged home with home health services.     [place] Lilianah Buffin   PA-C  06/21/2014, 10:50 PM

## 2014-06-26 ENCOUNTER — Ambulatory Visit (HOSPITAL_COMMUNITY)
Admission: RE | Admit: 2014-06-26 | Discharge: 2014-06-26 | Disposition: A | Discharge status: Home / Self Care | Payer: Medicare Other | Source: Ambulatory Visit | Attending: Anesthesiology | Admitting: Anesthesiology

## 2014-06-26 ENCOUNTER — Encounter (HOSPITAL_COMMUNITY)
Admission: RE | Admit: 2014-06-26 | Discharge: 2014-06-26 | Disposition: A | Discharge status: Home / Self Care | Payer: Medicare Other | Source: Ambulatory Visit | Attending: Orthopedic Surgery | Admitting: Orthopedic Surgery

## 2014-06-26 ENCOUNTER — Encounter (HOSPITAL_COMMUNITY): Payer: Self-pay

## 2014-06-26 DIAGNOSIS — Z01812 Encounter for preprocedural laboratory examination: Secondary | ICD-10-CM | POA: Insufficient documentation

## 2014-06-26 DIAGNOSIS — Z01818 Encounter for other preprocedural examination: Secondary | ICD-10-CM | POA: Diagnosis not present

## 2014-06-26 DIAGNOSIS — Z950 Presence of cardiac pacemaker: Secondary | ICD-10-CM | POA: Diagnosis not present

## 2014-06-26 DIAGNOSIS — I10 Essential (primary) hypertension: Secondary | ICD-10-CM

## 2014-06-26 DIAGNOSIS — M1612 Unilateral primary osteoarthritis, left hip: Secondary | ICD-10-CM | POA: Insufficient documentation

## 2014-06-26 HISTORY — DX: Presence of cardiac pacemaker: Z95.0

## 2014-06-26 HISTORY — DX: Unspecified osteoarthritis, unspecified site: M19.90

## 2014-06-26 LAB — URINALYSIS, ROUTINE W REFLEX MICROSCOPIC
Bilirubin Urine: NEGATIVE
Glucose, UA: NEGATIVE mg/dL
HGB URINE DIPSTICK: NEGATIVE
Ketones, ur: NEGATIVE mg/dL
LEUKOCYTES UA: NEGATIVE
Nitrite: NEGATIVE
PROTEIN: NEGATIVE mg/dL
Specific Gravity, Urine: 1.028 (ref 1.005–1.030)
UROBILINOGEN UA: 0.2 mg/dL (ref 0.0–1.0)
pH: 6 (ref 5.0–8.0)

## 2014-06-26 LAB — BASIC METABOLIC PANEL
Anion gap: 14 (ref 5–15)
BUN: 26 mg/dL — ABNORMAL HIGH (ref 6–23)
CHLORIDE: 99 meq/L (ref 96–112)
CO2: 24 meq/L (ref 19–32)
Calcium: 9.4 mg/dL (ref 8.4–10.5)
Creatinine, Ser: 1.18 mg/dL (ref 0.50–1.35)
GFR calc Af Amer: 73 mL/min — ABNORMAL LOW (ref >90)
GFR calc non Af Amer: 63 mL/min — ABNORMAL LOW (ref >90)
GLUCOSE: 112 mg/dL — AB (ref 70–99)
POTASSIUM: 4.5 meq/L (ref 3.7–5.3)
SODIUM: 137 meq/L (ref 137–147)

## 2014-06-26 LAB — SURGICAL PCR SCREEN
MRSA, PCR: NEGATIVE
Staphylococcus aureus: NEGATIVE

## 2014-06-26 LAB — CBC
HEMATOCRIT: 41.4 % (ref 39.0–52.0)
Hemoglobin: 14.1 g/dL (ref 13.0–17.0)
MCH: 29.3 pg (ref 26.0–34.0)
MCHC: 34.1 g/dL (ref 30.0–36.0)
MCV: 86.1 fL (ref 78.0–100.0)
Platelets: 203 10*3/uL (ref 150–400)
RBC: 4.81 MIL/uL (ref 4.22–5.81)
RDW: 12.9 % (ref 11.5–15.5)
WBC: 6.6 10*3/uL (ref 4.0–10.5)

## 2014-06-26 LAB — APTT: APTT: 28 s (ref 24–37)

## 2014-06-26 LAB — PROTIME-INR
INR: 1.07 (ref 0.00–1.49)
Prothrombin Time: 14 seconds (ref 11.6–15.2)

## 2014-06-26 LAB — ABO/RH: ABO/RH(D): A POS

## 2014-06-26 NOTE — Progress Notes (Signed)
   06/26/14 1056  OBSTRUCTIVE SLEEP APNEA  Have you ever been diagnosed with sleep apnea through a sleep study? No  Do you snore loudly (loud enough to be heard through closed doors)?  0  Do you often feel tired, fatigued, or sleepy during the daytime? 0  Has anyone observed you stop breathing during your sleep? 0  Do you have, or are you being treated for high blood pressure? 1  BMI more than 35 kg/m2? 0  Age over 65 years old? 1  Neck circumference greater than 40 cm/16 inches? 1  Gender: 1  Obstructive Sleep Apnea Score 4  Score 4 or greater  Results sent to PCP

## 2014-06-26 NOTE — Pre-Procedure Instructions (Addendum)
EKG AND CARDIOLOGY OFFICE NOTE 06-07-14 IN EPIC DR. Beckie Salts WITH CLEARANCE FOR SURGERY. COPY MADE OF PT'S WALLET PACEMAKER CARD - PERIOPERATIVE PRESCRIPTION FOR IMPLANTED CARDIAC DEVICE PROGRAMMING ORDERS HAVE BEEN REQUESTED. LAST PACEMAKER INTERROGATION WAS 06-07-14 - REPORT IN EPIC. CXR WAS DONE TODAY  DR. [place] NOTIFIED OF PT'S SURGERY - PT HAS PACEMAKER FOR HX COMPLETE HEART BLOCK - NO OTHER HEART PROBLEMS.  HAS CLEARANCE FROM CARDIOLGIST AND LAST PACEMAKER INTERROGATION WAS 06-07-14. I NOTED ON OR SCHEDULE THAT PT HAS PACEMAKER.

## 2014-06-26 NOTE — Patient Instructions (Addendum)
Devin Mercado  06/26/2014   Your procedure is scheduled on:    Monday   December 21st  Report to [facility] at  10:55 AM.  Call this number if you have problems the morning of surgery [phone]   Remember:  Do not eat food :[place].  YOU MAY HAVE CLEAR LIQUIDS TO DRINK FROM MIDNIGHT UNTIL 6:45 AM - LIKE WATER, COFFEE ( NO MILK OR CREAM ).    NOTHING TO DRINK AFTER 6:45 AM DAY OF SURGERY.     Take these medicines the morning of surgery with A SIP OF WATER:  LIPITOR                               You may not have any metal on your body including hair pins and              piercings  Do not wear jewelry, make-up, lotions, powders or perfumes.             Do not wear nail polish.  Do not shave  48 hours prior to surgery.              Men may shave face and neck.   Do not bring valuables to the hospital. [place].  Contacts, dentures or bridgework may not be worn into surgery.  Leave suitcase in the car. After surgery it may be brought to your room.      Special Instructions: N/A              Please read over the following fact sheets you were given: _____________________________________________________________________             [facility] - Preparing for Surgery Before surgery, you can play an important role.  Because skin is not sterile, your skin needs to be as free of germs as possible.  You can reduce the number of germs on your skin by washing with CHG (chlorahexidine gluconate) soap before surgery.  CHG is an antiseptic cleaner which kills germs and bonds with the skin to continue killing germs even after washing. Please DO NOT use if you have an allergy to CHG or antibacterial soaps.  If your skin becomes reddened/irritated stop using the CHG and inform your nurse when you arrive at Short Stay. Do not shave (including legs and  underarms) for at least 48 hours prior to the first CHG shower.  You may shave your face/neck. Please follow these instructions carefully:  1.  Shower with CHG Soap the night before surgery and the  morning of Surgery.  2.  If you choose to wash your hair, wash your hair first as usual with your  normal  shampoo.  3.  After you shampoo, rinse your hair and body thoroughly to remove the  shampoo.                           4.  Use CHG as you would any other liquid soap.  You can apply chg directly  to the skin and wash  Gently with a scrungie or clean washcloth.  5.  Apply the CHG Soap to your body ONLY FROM THE NECK DOWN.   Do not use on face/ open                           Wound or open sores. Avoid contact with eyes, ears mouth and genitals (private parts).                       Wash face,  Genitals (private parts) with your normal soap.             6.  Wash thoroughly, paying special attention to the area where your surgery  will be performed.  7.  Thoroughly rinse your body with warm water from the neck down.  8.  DO NOT shower/wash with your normal soap after using and rinsing off  the CHG Soap.                9.  Pat yourself dry with a clean towel.            10.  Wear clean pajamas.            11.  Place clean sheets on your bed the night of your first shower and do not  sleep with pets. Day of Surgery : Do not apply any lotions/deodorants the morning of surgery.  Please wear clean clothes to the hospital/surgery center.  FAILURE TO FOLLOW THESE INSTRUCTIONS MAY RESULT IN THE CANCELLATION OF YOUR SURGERY PATIENT SIGNATURE_________________________________  NURSE SIGNATURE__________________________________  ________________________________________________________________________   Devin Mercado  An incentive spirometer is a tool that can help keep your lungs clear and active. This tool measures how well you are filling your lungs with each breath. Taking  long deep breaths may help reverse or decrease the chance of developing breathing (pulmonary) problems (especially infection) following:  A long period of time when you are unable to move or be active. BEFORE THE PROCEDURE   If the spirometer includes an indicator to show your best effort, your nurse or respiratory therapist will set it to a desired goal.  If possible, sit up straight or lean slightly forward. Try not to slouch.  Hold the incentive spirometer in an upright position. INSTRUCTIONS FOR USE   Sit on the edge of your bed if possible, or sit up as far as you can in bed or on a chair.  Hold the incentive spirometer in an upright position.  Breathe out normally.  Place the mouthpiece in your mouth and seal your lips tightly around it.  Breathe in slowly and as deeply as possible, raising the piston or the ball toward the top of the column.  Hold your breath for 3-5 seconds or for as long as possible. Allow the piston or ball to fall to the bottom of the column.  Remove the mouthpiece from your mouth and breathe out normally.  Rest for a few seconds and repeat Steps 1 through 7 at least 10 times every 1-2 hours when you are awake. Take your time and take a few normal breaths between deep breaths.  The spirometer may include an indicator to show your best effort. Use the indicator as a goal to work toward during each repetition.  After each set of 10 deep breaths, practice coughing to be sure your lungs are clear. If you have an incision (the cut made at the time of surgery),  support your incision when coughing by placing a pillow or rolled up towels firmly against it. Once you are able to get out of bed, walk around indoors and cough well. You may stop using the incentive spirometer when instructed by your caregiver.  RISKS AND COMPLICATIONS  Take your time so you do not get dizzy or light-headed.  If you are in pain, you may need to take or ask for pain medication before  doing incentive spirometry. It is harder to take a deep breath if you are having pain. AFTER USE  Rest and breathe slowly and easily.  It can be helpful to keep track of a log of your progress. Your caregiver can provide you with a simple table to help with this. If you are using the spirometer at home, follow these instructions: [place] IF:   You are having difficultly using the spirometer.  You have trouble using the spirometer as often as instructed.  Your pain medication is not giving enough relief while using the spirometer.  You develop fever of 100.5 F (38.1 C) or higher. SEEK IMMEDIATE MEDICAL CARE IF:   You cough up bloody sputum that had not been present before.  You develop fever of 102 F (38.9 C) or greater.  You develop worsening pain at or near the incision site. MAKE SURE YOU:   Understand these instructions.  Will watch your condition.  Will get help right away if you are not doing well or get worse. Document Released: 11/10/2006 Document Revised: 09/22/2011 Document Reviewed: 01/11/2007 ExitCare Patient Information 2014 Exit[employer], [place].   ________________________________________________________________________  WHAT IS A BLOOD TRANSFUSION? Blood Transfusion Information  A transfusion is the replacement of blood or some of its parts. Blood is made up of multiple cells which provide different functions.  Red blood cells carry oxygen and are used for blood loss replacement.  White blood cells fight against infection.  Platelets control bleeding.  Plasma helps clot blood.  Other blood products are available for specialized needs, such as hemophilia or other clotting disorders. BEFORE THE TRANSFUSION  Who gives blood for transfusions?   Healthy volunteers who are fully evaluated to make sure their blood is safe. This is blood bank blood. Transfusion therapy is the safest it has ever been in the practice of medicine. Before blood is taken  from a donor, a complete history is taken to make sure that person has no history of diseases nor engages in risky social behavior (examples are intravenous drug use or sexual activity with multiple partners). The donor's travel history is screened to minimize risk of transmitting infections, such as malaria. The donated blood is tested for signs of infectious diseases, such as HIV and hepatitis. The blood is then tested to be sure it is compatible with you in order to minimize the chance of a transfusion reaction. If you or a relative donates blood, this is often done in anticipation of surgery and is not appropriate for emergency situations. It takes many days to process the donated blood. RISKS AND COMPLICATIONS Although transfusion therapy is very safe and saves many lives, the main dangers of transfusion include:   Getting an infectious disease.  Developing a transfusion reaction. This is an allergic reaction to something in the blood you were given. Every precaution is taken to prevent this. The decision to have a blood transfusion has been considered carefully by your caregiver before blood is given. Blood is not given unless the benefits outweigh the risks. AFTER THE TRANSFUSION  Right after receiving a blood transfusion, you will usually feel much better and more energetic. This is especially true if your red blood cells have gotten low (anemic). The transfusion raises the level of the red blood cells which carry oxygen, and this usually causes an energy increase.  The nurse administering the transfusion will monitor you carefully for complications. HOME CARE INSTRUCTIONS  No special instructions are needed after a transfusion. You may find your energy is better. Speak with your caregiver about any limitations on activity for underlying diseases you may have. SEEK MEDICAL CARE IF:   Your condition is not improving after your transfusion.  You develop redness or irritation at the  intravenous (IV) site. SEEK IMMEDIATE MEDICAL CARE IF:  Any of the following symptoms occur over the next 12 hours:  Shaking chills.  You have a temperature by mouth above 102 F (38.9 C), not controlled by medicine.  Chest, back, or muscle pain.  People around you feel you are not acting correctly or are confused.  Shortness of breath or difficulty breathing.  Dizziness and fainting.  You get a rash or develop hives.  You have a decrease in urine output.  Your urine turns a dark color or changes to pink, red, or brown. Any of the following symptoms occur over the next 10 days:  You have a temperature by mouth above 102 F (38.9 C), not controlled by medicine.  Shortness of breath.  Weakness after normal activity.  The white part of the eye turns yellow (jaundice).  You have a decrease in the amount of urine or are urinating less often.  Your urine turns a dark color or changes to pink, red, or brown. Document Released: 06/27/2000 Document Revised: 09/22/2011 Document Reviewed: 02/14/2008 [facility] Patient Information 2014 [place], [place].  _______________________________________________________________________

## 2014-06-26 NOTE — Progress Notes (Signed)
BMP done 06/26/14 faxed via EPIC to Dr Alvan Dame.

## 2014-07-03 ENCOUNTER — Encounter (HOSPITAL_COMMUNITY): Payer: Self-pay | Admitting: *Deleted

## 2014-07-03 ENCOUNTER — Inpatient Hospital Stay (HOSPITAL_COMMUNITY)
Admission: RE | Admit: 2014-07-03 | Discharge: 2014-07-04 | DRG: 470 | Disposition: A | Discharge status: Home Health | Payer: Medicare Other | Source: Ambulatory Visit | Attending: Orthopedic Surgery | Admitting: Orthopedic Surgery

## 2014-07-03 ENCOUNTER — Inpatient Hospital Stay (HOSPITAL_COMMUNITY): Payer: Medicare Other | Admitting: Certified Registered"

## 2014-07-03 ENCOUNTER — Inpatient Hospital Stay (HOSPITAL_COMMUNITY): Payer: Medicare Other

## 2014-07-03 ENCOUNTER — Encounter (HOSPITAL_COMMUNITY)
Admission: RE | Disposition: A | Discharge status: Home / Self Care | Payer: Self-pay | Source: Ambulatory Visit | Attending: Orthopedic Surgery

## 2014-07-03 DIAGNOSIS — Z6830 Body mass index (BMI) 30.0-30.9, adult: Secondary | ICD-10-CM | POA: Diagnosis not present

## 2014-07-03 DIAGNOSIS — M25552 Pain in left hip: Secondary | ICD-10-CM | POA: Diagnosis not present

## 2014-07-03 DIAGNOSIS — Z96649 Presence of unspecified artificial hip joint: Secondary | ICD-10-CM

## 2014-07-03 DIAGNOSIS — I442 Atrioventricular block, complete: Secondary | ICD-10-CM | POA: Diagnosis present

## 2014-07-03 DIAGNOSIS — I10 Essential (primary) hypertension: Secondary | ICD-10-CM | POA: Diagnosis present

## 2014-07-03 DIAGNOSIS — M1612 Unilateral primary osteoarthritis, left hip: Principal | ICD-10-CM | POA: Diagnosis present

## 2014-07-03 DIAGNOSIS — E785 Hyperlipidemia, unspecified: Secondary | ICD-10-CM | POA: Diagnosis present

## 2014-07-03 DIAGNOSIS — M169 Osteoarthritis of hip, unspecified: Secondary | ICD-10-CM | POA: Diagnosis not present

## 2014-07-03 DIAGNOSIS — Z96642 Presence of left artificial hip joint: Secondary | ICD-10-CM | POA: Diagnosis not present

## 2014-07-03 DIAGNOSIS — Z471 Aftercare following joint replacement surgery: Secondary | ICD-10-CM | POA: Diagnosis not present

## 2014-07-03 DIAGNOSIS — E669 Obesity, unspecified: Secondary | ICD-10-CM | POA: Diagnosis present

## 2014-07-03 HISTORY — PX: TOTAL HIP ARTHROPLASTY: SHX124

## 2014-07-03 LAB — TYPE AND SCREEN
ABO/RH(D): A POS
ANTIBODY SCREEN: NEGATIVE

## 2014-07-03 SURGERY — ARTHROPLASTY, HIP, TOTAL, ANTERIOR APPROACH
Anesthesia: Spinal | Site: Hip | Laterality: Left

## 2014-07-03 MED ORDER — ALUM & MAG HYDROXIDE-SIMETH 200-200-20 MG/5ML PO SUSP: 30.0000 mL | ORAL | Status: DC | PRN

## 2014-07-03 MED ORDER — MIDAZOLAM HCL 5 MG/5ML IJ SOLN
INTRAMUSCULAR | Status: DC | PRN
  Administered 2014-07-03: 2 mg via INTRAVENOUS

## 2014-07-03 MED ORDER — METHOCARBAMOL 500 MG PO TABS
500.0000 mg | ORAL_TABLET | Freq: Four times a day (QID) | ORAL | Status: DC | PRN
  Administered 2014-07-04 (×3): 500 mg via ORAL
  Filled 2014-07-03 (×3): qty 1

## 2014-07-03 MED ORDER — METOCLOPRAMIDE HCL 10 MG PO TABS: 5.0000 mg | ORAL_TABLET | Freq: Three times a day (TID) | ORAL | Status: DC | PRN

## 2014-07-03 MED ORDER — TRANEXAMIC ACID 100 MG/ML IV SOLN
1000.0000 mg | Freq: Once | INTRAVENOUS | Status: AC
  Administered 2014-07-03: 1000 mg via INTRAVENOUS
  Filled 2014-07-03: qty 10

## 2014-07-03 MED ORDER — ASPIRIN EC 325 MG PO TBEC
325.0000 mg | DELAYED_RELEASE_TABLET | Freq: Two times a day (BID) | ORAL | Status: DC
  Administered 2014-07-04: 325 mg via ORAL
  Filled 2014-07-03 (×3): qty 1

## 2014-07-03 MED ORDER — HYDROMORPHONE HCL 1 MG/ML IJ SOLN
0.5000 mg | INTRAMUSCULAR | Status: DC | PRN
  Administered 2014-07-03: 1 mg via INTRAVENOUS
  Filled 2014-07-03: qty 1

## 2014-07-03 MED ORDER — BISACODYL 10 MG RE SUPP: 10.0000 mg | Freq: Every day | RECTAL | Status: DC | PRN

## 2014-07-03 MED ORDER — DEXAMETHASONE SODIUM PHOSPHATE 10 MG/ML IJ SOLN
INTRAMUSCULAR | Status: AC
  Filled 2014-07-03: qty 1

## 2014-07-03 MED ORDER — FENTANYL CITRATE 0.05 MG/ML IJ SOLN
INTRAMUSCULAR | Status: AC
  Filled 2014-07-03: qty 2

## 2014-07-03 MED ORDER — PHENYLEPHRINE HCL 10 MG/ML IJ SOLN
INTRAMUSCULAR | Status: AC
  Filled 2014-07-03: qty 1

## 2014-07-03 MED ORDER — LACTATED RINGERS IV SOLN
INTRAVENOUS | Status: DC
  Administered 2014-07-03 (×2): via INTRAVENOUS
  Administered 2014-07-03: 1000 mL via INTRAVENOUS

## 2014-07-03 MED ORDER — HYDROMORPHONE HCL 1 MG/ML IJ SOLN
INTRAMUSCULAR | Status: AC
  Filled 2014-07-03: qty 1

## 2014-07-03 MED ORDER — MIDAZOLAM HCL 2 MG/2ML IJ SOLN
INTRAMUSCULAR | Status: AC
  Filled 2014-07-03: qty 2

## 2014-07-03 MED ORDER — DEXAMETHASONE SODIUM PHOSPHATE 10 MG/ML IJ SOLN
10.0000 mg | Freq: Once | INTRAMUSCULAR | Status: DC
  Filled 2014-07-03: qty 1

## 2014-07-03 MED ORDER — BUPIVACAINE HCL (PF) 0.5 % IJ SOLN
INTRAMUSCULAR | Status: DC | PRN
Start: 2014-07-03 — End: 2014-07-03
  Administered 2014-07-03: 3 mL

## 2014-07-03 MED ORDER — ONDANSETRON HCL 4 MG/2ML IJ SOLN: 4.0000 mg | Freq: Four times a day (QID) | INTRAMUSCULAR | Status: DC | PRN

## 2014-07-03 MED ORDER — DOCUSATE SODIUM 100 MG PO CAPS
100.0000 mg | ORAL_CAPSULE | Freq: Two times a day (BID) | ORAL | Status: DC
  Administered 2014-07-03 – 2014-07-04 (×2): 100 mg via ORAL

## 2014-07-03 MED ORDER — THROMBIN 5000 UNITS EX SOLR
OROMUCOSAL | Status: DC | PRN
  Administered 2014-07-03: 10 mL via TOPICAL

## 2014-07-03 MED ORDER — 0.9 % SODIUM CHLORIDE (POUR BTL) OPTIME
TOPICAL | Status: DC | PRN
  Administered 2014-07-03: 1000 mL

## 2014-07-03 MED ORDER — PROPOFOL 10 MG/ML IV BOLUS
INTRAVENOUS | Status: AC
  Filled 2014-07-03: qty 20

## 2014-07-03 MED ORDER — PHENYLEPHRINE HCL 10 MG/ML IJ SOLN
10.0000 mg | INTRAVENOUS | Status: DC | PRN
  Administered 2014-07-03: 25 ug/min via INTRAVENOUS

## 2014-07-03 MED ORDER — FERROUS SULFATE 325 (65 FE) MG PO TABS
325.0000 mg | ORAL_TABLET | Freq: Three times a day (TID) | ORAL | Status: DC
  Administered 2014-07-04: 325 mg via ORAL
  Filled 2014-07-03 (×5): qty 1

## 2014-07-03 MED ORDER — POLYETHYLENE GLYCOL 3350 17 G PO PACK: 17.0000 g | PACK | Freq: Two times a day (BID) | ORAL | Status: DC

## 2014-07-03 MED ORDER — METOCLOPRAMIDE HCL 5 MG/ML IJ SOLN: 5.0000 mg | Freq: Three times a day (TID) | INTRAMUSCULAR | Status: DC | PRN

## 2014-07-03 MED ORDER — CEFAZOLIN SODIUM-DEXTROSE 2-3 GM-% IV SOLR
INTRAVENOUS | Status: AC
  Filled 2014-07-03: qty 50

## 2014-07-03 MED ORDER — DIPHENHYDRAMINE HCL 25 MG PO CAPS: 25.0000 mg | ORAL_CAPSULE | Freq: Four times a day (QID) | ORAL | Status: DC | PRN

## 2014-07-03 MED ORDER — DEXAMETHASONE SODIUM PHOSPHATE 10 MG/ML IJ SOLN
10.0000 mg | Freq: Once | INTRAMUSCULAR | Status: AC
  Administered 2014-07-03: 10 mg via INTRAVENOUS

## 2014-07-03 MED ORDER — ONDANSETRON HCL 4 MG PO TABS: 4.0000 mg | ORAL_TABLET | Freq: Four times a day (QID) | ORAL | Status: DC | PRN

## 2014-07-03 MED ORDER — CEFAZOLIN SODIUM-DEXTROSE 2-3 GM-% IV SOLR
2.0000 g | Freq: Four times a day (QID) | INTRAVENOUS | Status: AC
  Administered 2014-07-03 – 2014-07-04 (×2): 2 g via INTRAVENOUS
  Filled 2014-07-03 (×2): qty 50

## 2014-07-03 MED ORDER — SODIUM CHLORIDE 0.9 % IV SOLN
100.0000 mL/h | INTRAVENOUS | Status: DC
  Administered 2014-07-04: 100 mL/h via INTRAVENOUS
  Filled 2014-07-03 (×4): qty 1000

## 2014-07-03 MED ORDER — ONDANSETRON HCL 4 MG/2ML IJ SOLN
INTRAMUSCULAR | Status: AC
  Filled 2014-07-03: qty 2

## 2014-07-03 MED ORDER — PROPOFOL 10 MG/ML IV BOLUS
INTRAVENOUS | Status: DC | PRN
Start: 2014-07-03 — End: 2014-07-03
  Administered 2014-07-03: 50 mg via INTRAVENOUS

## 2014-07-03 MED ORDER — CEFAZOLIN SODIUM-DEXTROSE 2-3 GM-% IV SOLR
2.0000 g | INTRAVENOUS | Status: AC
  Administered 2014-07-03: 2 g via INTRAVENOUS

## 2014-07-03 MED ORDER — MAGNESIUM CITRATE PO SOLN: 1.0000 | Freq: Once | ORAL | Status: AC | PRN

## 2014-07-03 MED ORDER — FENTANYL CITRATE 0.05 MG/ML IJ SOLN
INTRAMUSCULAR | Status: DC | PRN
  Administered 2014-07-03: 100 ug via INTRAVENOUS

## 2014-07-03 MED ORDER — PROPOFOL INFUSION 10 MG/ML OPTIME
INTRAVENOUS | Status: DC | PRN
  Administered 2014-07-03: 100 ug/kg/min via INTRAVENOUS

## 2014-07-03 MED ORDER — CELECOXIB 200 MG PO CAPS
200.0000 mg | ORAL_CAPSULE | Freq: Two times a day (BID) | ORAL | Status: DC
  Administered 2014-07-03 – 2014-07-04 (×2): 200 mg via ORAL
  Filled 2014-07-03 (×3): qty 1

## 2014-07-03 MED ORDER — METHOCARBAMOL 1000 MG/10ML IJ SOLN
500.0000 mg | Freq: Four times a day (QID) | INTRAVENOUS | Status: DC | PRN
  Administered 2014-07-03: 500 mg via INTRAVENOUS
  Filled 2014-07-03 (×2): qty 5

## 2014-07-03 MED ORDER — LIDOCAINE HCL (CARDIAC) 20 MG/ML IV SOLN
INTRAVENOUS | Status: DC | PRN
  Administered 2014-07-03: 50 mg via INTRAVENOUS

## 2014-07-03 MED ORDER — PHENYLEPHRINE 40 MCG/ML (10ML) SYRINGE FOR IV PUSH (FOR BLOOD PRESSURE SUPPORT)
PREFILLED_SYRINGE | INTRAVENOUS | Status: AC
  Filled 2014-07-03: qty 10

## 2014-07-03 MED ORDER — PHENOL 1.4 % MT LIQD
1.0000 | OROMUCOSAL | Status: DC | PRN
  Filled 2014-07-03: qty 177

## 2014-07-03 MED ORDER — ATORVASTATIN CALCIUM 20 MG PO TABS
20.0000 mg | ORAL_TABLET | Freq: Every day | ORAL | Status: DC
  Administered 2014-07-04: 20 mg via ORAL
  Filled 2014-07-03 (×2): qty 1

## 2014-07-03 MED ORDER — ONDANSETRON HCL 4 MG/2ML IJ SOLN
INTRAMUSCULAR | Status: DC | PRN
  Administered 2014-07-03: 4 mg via INTRAVENOUS

## 2014-07-03 MED ORDER — PROMETHAZINE HCL 25 MG/ML IJ SOLN: 6.2500 mg | INTRAMUSCULAR | Status: DC | PRN

## 2014-07-03 MED ORDER — THROMBIN 5000 UNITS EX SOLR
CUTANEOUS | Status: AC
  Filled 2014-07-03: qty 10000

## 2014-07-03 MED ORDER — BUPIVACAINE HCL (PF) 0.5 % IJ SOLN
INTRAMUSCULAR | Status: AC
  Filled 2014-07-03: qty 30

## 2014-07-03 MED ORDER — CHLORHEXIDINE GLUCONATE 4 % EX LIQD: 60.0000 mL | Freq: Once | CUTANEOUS | Status: DC

## 2014-07-03 MED ORDER — PHENYLEPHRINE HCL 10 MG/ML IJ SOLN
INTRAMUSCULAR | Status: DC | PRN
Start: 2014-07-03 — End: 2014-07-03
  Administered 2014-07-03 (×4): 80 ug via INTRAVENOUS

## 2014-07-03 MED ORDER — HYDROCODONE-ACETAMINOPHEN 7.5-325 MG PO TABS
1.0000 | ORAL_TABLET | ORAL | Status: DC
  Administered 2014-07-03 – 2014-07-04 (×3): 2 via ORAL
  Administered 2014-07-04: 1 via ORAL
  Administered 2014-07-04 (×2): 2 via ORAL
  Filled 2014-07-03: qty 1
  Filled 2014-07-03 (×2): qty 2
  Filled 2014-07-03: qty 1
  Filled 2014-07-03 (×4): qty 2

## 2014-07-03 MED ORDER — MENTHOL 3 MG MT LOZG
1.0000 | LOZENGE | OROMUCOSAL | Status: DC | PRN
  Filled 2014-07-03: qty 9

## 2014-07-03 MED ORDER — HYDROMORPHONE HCL 1 MG/ML IJ SOLN
0.2500 mg | INTRAMUSCULAR | Status: DC | PRN
  Administered 2014-07-03 (×2): 0.5 mg via INTRAVENOUS

## 2014-07-03 SURGICAL SUPPLY — 42 items
BAG ZIPLOCK 12X15 (MISCELLANEOUS) IMPLANT
CAPT HIP TOTAL 2 ×3 IMPLANT
COVER PERINEAL POST (MISCELLANEOUS) ×3 IMPLANT
DRAPE C-ARM 42X120 X-RAY (DRAPES) ×3 IMPLANT
DRAPE STERI IOBAN 125X83 (DRAPES) ×3 IMPLANT
DRAPE U-SHAPE 47X51 STRL (DRAPES) ×9 IMPLANT
DRSG AQUACEL AG ADV 3.5X10 (GAUZE/BANDAGES/DRESSINGS) ×3 IMPLANT
DURAPREP 26ML APPLICATOR (WOUND CARE) ×3 IMPLANT
ELECT BLADE TIP CTD 4 INCH (ELECTRODE) ×3 IMPLANT
ELECT REM PT RETURN 9FT ADLT (ELECTROSURGICAL) ×3
ELECTRODE REM PT RTRN 9FT ADLT (ELECTROSURGICAL) ×1 IMPLANT
FACESHIELD WRAPAROUND (MASK) ×12 IMPLANT
GLOVE BIO SURGEON STRL SZ7.5 (GLOVE) ×3 IMPLANT
GLOVE BIOGEL PI IND STRL 6.5 (GLOVE) ×1 IMPLANT
GLOVE BIOGEL PI IND STRL 7.5 (GLOVE) ×2 IMPLANT
GLOVE BIOGEL PI IND STRL 8.5 (GLOVE) ×1 IMPLANT
GLOVE BIOGEL PI INDICATOR 6.5 (GLOVE) ×2
GLOVE BIOGEL PI INDICATOR 7.5 (GLOVE) ×4
GLOVE BIOGEL PI INDICATOR 8.5 (GLOVE) ×2
GLOVE ECLIPSE 8.0 STRL XLNG CF (GLOVE) ×3 IMPLANT
GLOVE ORTHO TXT STRL SZ7.5 (GLOVE) ×3 IMPLANT
GLOVE SURG SS PI 7.0 STRL IVOR (GLOVE) ×3 IMPLANT
GLOVE SURG SS PI 7.5 STRL IVOR (GLOVE) ×3 IMPLANT
GOWN BRE IMP PREV XXLGXLNG (GOWN DISPOSABLE) ×3 IMPLANT
GOWN SPEC L3 XXLG W/TWL (GOWN DISPOSABLE) ×3 IMPLANT
GOWN STRL REUS W/TWL LRG LVL3 (GOWN DISPOSABLE) ×3 IMPLANT
GOWN STRL REUS W/TWL XL LVL3 (GOWN DISPOSABLE) ×3 IMPLANT
HOLDER FOLEY CATH W/STRAP (MISCELLANEOUS) ×3 IMPLANT
KIT BASIN OR (CUSTOM PROCEDURE TRAY) ×3 IMPLANT
LIQUID BAND (GAUZE/BANDAGES/DRESSINGS) ×3 IMPLANT
PACK TOTAL JOINT (CUSTOM PROCEDURE TRAY) ×3 IMPLANT
SAW OSC TIP CART 19.5X105X1.3 (SAW) ×3 IMPLANT
SPONGE SURGIFOAM ABS GEL 100 (HEMOSTASIS) ×3 IMPLANT
SUT MNCRL AB 4-0 PS2 18 (SUTURE) ×3 IMPLANT
SUT VIC AB 1 CT1 36 (SUTURE) ×9 IMPLANT
SUT VIC AB 2-0 CT1 27 (SUTURE) ×4
SUT VIC AB 2-0 CT1 TAPERPNT 27 (SUTURE) ×2 IMPLANT
SUT VLOC 180 0 24IN GS25 (SUTURE) ×3 IMPLANT
TOWEL OR 17X26 10 PK STRL BLUE (TOWEL DISPOSABLE) ×3 IMPLANT
TOWEL OR NON WOVEN STRL DISP B (DISPOSABLE) ×3 IMPLANT
TRAY FOLEY CATH 16FRSI W/METER (SET/KITS/TRAYS/PACK) ×3 IMPLANT
WATER STERILE IRR 1500ML POUR (IV SOLUTION) ×3 IMPLANT

## 2014-07-03 NOTE — Transfer of Care (Signed)
Immediate Anesthesia Transfer of Care Note  Patient: Devin Mercado  Procedure(s) Performed: Procedure(s): LEFT TOTAL HIP ARTHROPLASTY ANTERIOR APPROACH (Left)  Patient Location: PACU  Anesthesia Type:Regional  Level of Consciousness: awake, alert  and oriented  Airway & Oxygen Therapy: Patient Spontanous Breathing and Patient connected to face mask oxygen  Post-op Assessment: Report given to PACU RN and Post -op Vital signs reviewed and stable  Post vital signs: Reviewed and stable  Complications: No apparent anesthesia complications

## 2014-07-03 NOTE — Progress Notes (Signed)
Utilization review completed.  

## 2014-07-03 NOTE — Interval H&P Note (Signed)
History and Physical Interval Note:  07/03/2014 11:03 AM  Devin Mercado  has presented today for surgery, with the diagnosis of left hip osteoarthritis  The various methods of treatment have been discussed with the patient and family. After consideration of risks, benefits and other options for treatment, the patient has consented to  Procedure(s): LEFT TOTAL HIP ARTHROPLASTY ANTERIOR APPROACH (Left) as a surgical intervention .  The patient's history has been reviewed, patient examined, no change in status, stable for surgery.  I have reviewed the patient's chart and labs.  Questions were answered to the patient's satisfaction.     [name]

## 2014-07-03 NOTE — Op Note (Signed)
NAME:  Devin Mercado                ACCOUNT NO.: [id number]      MEDICAL RECORD NO.: [id number]      FACILITY:  [facility]      PHYSICIAN:  Paralee Cancel D  DATE OF BIRTH:  1950-08-21     DATE OF PROCEDURE:  07/03/2014                                 OPERATIVE REPORT         PREOPERATIVE DIAGNOSIS: Left  hip osteoarthritis.      POSTOPERATIVE DIAGNOSIS:  Left hip osteoarthritis.      PROCEDURE:  Left total hip replacement through an anterior approach   utilizing DePuy THR system, component size 42mm pinnacle cup, a size 36+4 neutral   Altrex liner, a size 8 Hi Tri Lock stem with a 36+5 delta ceramic   ball.      SURGEON:  Pietro Cassis. Alvan Dame, M.D.      ASSISTANT:  Danae Orleans, PA-C     ANESTHESIA:  Spinal.      SPECIMENS:  None.      COMPLICATIONS:  None.      BLOOD LOSS:  1100 cc     DRAINS:  None.      INDICATION OF THE PROCEDURE:  Devin Mercado is a 63 y.o. male who had   presented to office for evaluation of left hip pain.  Radiographs revealed   progressive degenerative changes with bone-on-bone   articulation to the  hip joint.  The patient had painful limited range of   motion significantly affecting their overall quality of life.  The patient was failing to    respond to conservative measures, and at this point was ready   to proceed with more definitive measures.  The patient has noted progressive   degenerative changes in his hip, progressive problems and dysfunction   with regarding the hip prior to surgery.  Consent was obtained for   benefit of pain relief.  Specific risk of infection, DVT, component   failure, dislocation, need for revision surgery, as well discussion of   the anterior versus posterior approach were reviewed.  Consent was   obtained for benefit of anterior pain relief through an anterior   approach.      PROCEDURE IN DETAIL:  The patient was brought to operative theater.   Once adequate anesthesia, preoperative  antibiotics, 2gm of Ancef and 1gm of Tranexamic Acid administered.   The patient was positioned supine on the OSI Hanna table.  Once adequate   padding of boney process was carried out, we had predraped out the hip, and  used fluoroscopy to confirm orientation of the pelvis and position.      The left hip was then prepped and draped from proximal iliac crest to   mid thigh with shower curtain technique.      Time-out was performed identifying the patient, planned procedure, and   extremity.     An incision was then made 2 cm distal and lateral to the   anterior superior iliac spine extending over the orientation of the   tensor fascia lata muscle and sharp dissection was carried down to the   fascia of the muscle and protractor placed in the soft tissues.      The fascia was then incised.  The muscle belly was  identified and swept   laterally and retractor placed along the superior neck.  Following   cauterization of the circumflex vessels and removing some pericapsular   fat, a second cobra retractor was placed on the inferior neck.  A third   retractor was placed on the anterior acetabulum after elevating the   anterior rectus.  A L-capsulotomy was along the line of the   superior neck to the trochanteric fossa, then extended proximally and   distally.  Tag sutures were placed and the retractors were then placed   intracapsular.  We then identified the trochanteric fossa and   orientation of my neck cut, confirmed this radiographically   and then made a neck osteotomy with the femur on traction.  The femoral   head was removed without difficulty or complication.  Traction was let   off and retractors were placed posterior and anterior around the   acetabulum.      The labrum and foveal tissue were debrided.  I began reaming with a 56mm   reamer and reamed up to 90mm reamer with good bony bed preparation and a 69mm   cup was chosen.  The final 33mm Pinnacle cup was then impacted  under fluoroscopy  to confirm the depth of penetration and orientation with respect to   abduction.  A screw was placed followed by the hole eliminator.  The final   36+4 neutral Altrex liner was impacted with good visualized rim fit.  The cup was positioned anatomically within the acetabular portion of the pelvis.      At this point, the femur was rolled at 80 degrees.  Further capsule was   released off the inferior aspect of the femoral neck.  I then   released the superior capsule proximally.  The hook was placed laterally   along the femur and elevated manually and held in position with the bed   hook.  The leg was then extended and adducted with the leg rolled to 100   degrees of external rotation.  Once the proximal femur was fully   exposed, I used a box osteotome to set orientation.  I then began   broaching with the starting chili pepper broach and passed this by hand and then broached up to 8.  With the 8 broach in place I chose a high offset neck and did a trial reduction.  The offset was appropriate, leg lengths   appeared to be equal, confirmed radiographically.   Given these findings, I went ahead and dislocated the hip, repositioned all   retractors and positioned the right hip in the extended and abducted position.  The final 8 Hi Tri Lock stem was   chosen and it was impacted down to the level of neck cut.  Based on this   and the trial reduction, a 36+5 delta ceramic ball was chosen and   impacted onto a clean and dry trunnion, and the hip was reduced.  The   hip had been irrigated throughout the case again at this point.    Due to some oozing identified on the area of the transverse acetabular ligament (obturator region) I chose to pack thrombin soaked gel foam.  This provided hemostasis to the point at closure there was no oozing present.  I did reapproximate the superior capsular leaflet to the anterior leaflet   using #1 Vicryl.  The fascia of the   tensor fascia lata  muscle was then reapproximated using #1 Vicryl and #0 V-lock  sutures.  The   remaining wound was closed with 2-0 Vicryl and running 4-0 Monocryl.   The hip was cleaned, dried, and dressed sterilely using Dermabond and   Aquacel dressing.  He was then brought   to recovery room in stable condition tolerating the procedure well.    Danae Orleans, PA-C was present for the entirety of the case involved from   preoperative positioning, perioperative retractor management, general   facilitation of the case, as well as primary wound closure as assistant.            Pietro Cassis Alvan Dame, M.D.        07/03/2014 1:55 PM

## 2014-07-03 NOTE — Anesthesia Procedure Notes (Signed)
Spinal Patient location during procedure: OR End time: 07/03/2014 12:21 PM Staffing Resident/CRNA: ,  Performed by: anesthesiologist and resident/CRNA  Preanesthetic Checklist Completed: patient identified, site marked, surgical consent, pre-op evaluation, timeout performed, IV checked, risks and benefits discussed and monitors and equipment checked Spinal Block Patient position: sitting Prep: Betadine Patient monitoring: heart rate, continuous pulse ox and blood pressure Approach: midline Location: L2-3 Injection technique: single-shot Needle Needle type: Sprotte and Pencil-Tip  Needle gauge: 24 G Needle length: 9 cm Assessment Sensory level: T6 Additional Notes Expiration date of kit checked and confirmed. Patient tolerated procedure well, without complications.     

## 2014-07-03 NOTE — Anesthesia Postprocedure Evaluation (Signed)
  Anesthesia Post-op Note  Patient: Devin Mercado  Procedure(s) Performed: Procedure(s) (LRB): LEFT TOTAL HIP ARTHROPLASTY ANTERIOR APPROACH (Left)  Patient Location: PACU  Anesthesia Type: Regional  Level of Consciousness: awake and alert   Airway and Oxygen Therapy: Patient Spontanous Breathing  Post-op Pain: mild  Post-op Assessment: Post-op Vital signs reviewed, Patient's Cardiovascular Status Stable, Respiratory Function Stable, Patent Airway and No signs of Nausea or vomiting  Last Vitals:  Filed Vitals:   07/03/14 1745  BP: 135/94  Pulse: 97  Temp: 36.6 C  Resp: 16    Post-op Vital Signs: stable   Complications: No apparent anesthesia complications

## 2014-07-03 NOTE — Anesthesia Preprocedure Evaluation (Signed)
Anesthesia Evaluation  Patient identified by MRN, date of birth, ID band Patient awake    Reviewed: Allergy & Precautions, H&P , NPO status , Patient's Chart, lab work & pertinent test results  Airway Mallampati: II  TM Distance: >3 FB Neck ROM: Full    Dental no notable dental hx.    Pulmonary neg pulmonary ROS,  breath sounds clear to auscultation  Pulmonary exam normal       Cardiovascular hypertension, Pt. on medications + dysrhythmias + pacemaker Rhythm:Regular Rate:Normal  H/O heart block   Neuro/Psych negative neurological ROS  negative psych ROS   GI/Hepatic negative GI ROS, Neg liver ROS,   Endo/Other  negative endocrine ROS  Renal/GU negative Renal ROS  negative genitourinary   Musculoskeletal  (+) Arthritis -,   Abdominal (+) + obese,   Peds negative pediatric ROS (+)  Hematology negative hematology ROS (+)   Anesthesia Other Findings   Reproductive/Obstetrics negative OB ROS                             Anesthesia Physical Anesthesia Plan  ASA: III  Anesthesia Plan: Spinal   Post-op Pain Management:    Induction: Intravenous  Airway Management Planned:   Additional Equipment:   Intra-op Plan:   Post-operative Plan:   Informed Consent: I have reviewed the patients History and Physical, chart, labs and discussed the procedure including the risks, benefits and alternatives for the proposed anesthesia with the patient or authorized representative who has indicated his/her understanding and acceptance.   Dental advisory given  Plan Discussed with: CRNA  Anesthesia Plan Comments: (Discussed risks/benefits of spinal including headache, backache, failure, bleeding, infection, and nerve damage. Patient consents to spinal. Questions answered. Coagulation studies and platelet count acceptable.)        Anesthesia Quick Evaluation

## 2014-07-04 ENCOUNTER — Encounter (HOSPITAL_COMMUNITY): Payer: Self-pay | Admitting: Orthopedic Surgery

## 2014-07-04 DIAGNOSIS — E669 Obesity, unspecified: Secondary | ICD-10-CM | POA: Diagnosis present

## 2014-07-04 LAB — CBC
HCT: 32.7 % — ABNORMAL LOW (ref 39.0–52.0)
Hemoglobin: 11.1 g/dL — ABNORMAL LOW (ref 13.0–17.0)
MCH: 29.1 pg (ref 26.0–34.0)
MCHC: 33.9 g/dL (ref 30.0–36.0)
MCV: 85.8 fL (ref 78.0–100.0)
PLATELETS: 171 10*3/uL (ref 150–400)
RBC: 3.81 MIL/uL — ABNORMAL LOW (ref 4.22–5.81)
RDW: 12.8 % (ref 11.5–15.5)
WBC: 10.8 10*3/uL — AB (ref 4.0–10.5)

## 2014-07-04 LAB — BASIC METABOLIC PANEL
ANION GAP: 5 (ref 5–15)
BUN: 20 mg/dL (ref 6–23)
CHLORIDE: 103 meq/L (ref 96–112)
CO2: 25 mmol/L (ref 19–32)
Calcium: 7.9 mg/dL — ABNORMAL LOW (ref 8.4–10.5)
Creatinine, Ser: 1.11 mg/dL (ref 0.50–1.35)
GFR calc Af Amer: 79 mL/min — ABNORMAL LOW (ref >90)
GFR, EST NON AFRICAN AMERICAN: 68 mL/min — AB (ref >90)
Glucose, Bld: 142 mg/dL — ABNORMAL HIGH (ref 70–99)
Potassium: 4.4 mmol/L (ref 3.5–5.1)
SODIUM: 133 mmol/L — AB (ref 135–145)

## 2014-07-04 MED ORDER — POLYETHYLENE GLYCOL 3350 17 G PO PACK: 17.0000 g | PACK | Freq: Two times a day (BID) | ORAL | Status: DC

## 2014-07-04 MED ORDER — FERROUS SULFATE 325 (65 FE) MG PO TABS: 325.0000 mg | ORAL_TABLET | Freq: Three times a day (TID) | ORAL | Status: DC

## 2014-07-04 MED ORDER — HYDROCODONE-ACETAMINOPHEN 7.5-325 MG PO TABS: 1.0000 | ORAL_TABLET | ORAL | Status: DC | PRN

## 2014-07-04 MED ORDER — ASPIRIN 325 MG PO TBEC: 325.0000 mg | DELAYED_RELEASE_TABLET | Freq: Two times a day (BID) | ORAL | Status: AC

## 2014-07-04 MED ORDER — DSS 100 MG PO CAPS: 100.0000 mg | ORAL_CAPSULE | Freq: Two times a day (BID) | ORAL | Status: DC

## 2014-07-04 MED ORDER — TIZANIDINE HCL 4 MG PO TABS
4.0000 mg | ORAL_TABLET | Freq: Four times a day (QID) | ORAL | Status: DC | PRN
Start: 2014-07-04 — End: 2014-10-18

## 2014-07-04 NOTE — Progress Notes (Signed)
     Subjective: 1 Day Post-Op Procedure(s) (LRB): LEFT TOTAL HIP ARTHROPLASTY ANTERIOR APPROACH (Left)   Seen by Dr. Huntley Dec. Patient reports pain as mild, pain controlled. No events throughout the night. Ready to be discharged home after PT.  Objective:   VITALS:   Filed Vitals:   07/04/14 0547  BP: 130/67  Pulse: 60  Temp: 98.7 F (37.1 C)  Resp: 16    Dorsiflexion/Plantar flexion intact Incision: dressing C/D/I No cellulitis present Compartment soft  LABS  Recent Labs  07/04/14 0430  HGB 11.1*  HCT 32.7*  WBC 10.8*  PLT 171     Recent Labs  07/04/14 0430  NA 133*  K 4.4  BUN 20  CREATININE 1.11  GLUCOSE 142*     Assessment/Plan: 1 Day Post-Op Procedure(s) (LRB): LEFT TOTAL HIP ARTHROPLASTY ANTERIOR APPROACH (Left) Foley cath d/c'ed Advance diet Up with therapy D/C IV fluids Discharge home with home health  Follow up in 2 weeks at [facility]. Follow up with OLIN,Patrycja Mumpower D in 2 weeks.  Contact information:  [facility] [address] [place] [phone]    Obese (BMI 30-39.9) Estimated body mass index is 30.1 kg/(m^2) as calculated from the following:   Height as of this encounter: 6' (1.829 m).   Weight as of this encounter: 100.699 kg (222 lb). Patient also counseled that weight may inhibit the healing process Patient counseled that losing weight will help with future health issues      West Pugh. Glynn Yepes   PAC  07/04/2014, 9:23 AM

## 2014-07-04 NOTE — Progress Notes (Signed)
Physical Therapy Treatment Note    07/04/14 1500  PT Visit Information  Last PT Received On 07/04/14  Assistance Needed +1  History of Present Illness Pt is a 63 year old male s/p L direct anterior THA.  PT Time Calculation  PT Start Time (ACUTE ONLY) 1533  PT Stop Time (ACUTE ONLY) 1545  PT Time Calculation (min) (ACUTE ONLY) 12 min  Subjective Data  Subjective Pt ambulated in hallway and performed steps with son holding RW.  Pt provided with HEP and stairs handouts.  Pt had no further questions and feels ready for d/c.  Precautions  Precautions None  Restrictions  Weight Bearing Restrictions No  Pain Assessment  Pain Assessment No/denies pain  Cognition  Arousal/Alertness Awake/alert  Behavior During Therapy WFL for tasks assessed/performed  Overall Cognitive Status Within Functional Limits for tasks assessed  Transfers  Overall transfer level Needs assistance  Equipment used Rolling walker (2 wheeled)  Transfers Sit to/from Stand  Sit to Stand Supervision  Ambulation/Gait  Ambulation/Gait assistance Supervision  Ambulation Distance (Feet) 200 Feet  Assistive device Rolling walker (2 wheeled)  Gait Pattern/deviations Step-to pattern;Antalgic;Trunk flexed  General Gait Details verbal cues for sequence, posture, RW positioning  Stairs Yes  Stairs assistance Min guard  Stair Management Step to pattern;Backwards;With walker  Number of Stairs 3  General stair comments pt, spouse, and son educated on safe stair technique, handout provided as well  PT - End of Session  Activity Tolerance Patient tolerated treatment well  Patient left with call bell/phone within reach;with family/visitor present (standing, pt was going to have spouse assist him to bathroom)  PT - Assessment/Plan  PT Plan Current plan remains appropriate  PT Frequency (ACUTE ONLY) 7X/week  Follow Up Recommendations Home health PT  PT equipment Rolling walker with 5" wheels  PT Goal Progression  Progress  towards PT goals Progressing toward goals  PT General Charges  $$ ACUTE PT VISIT 1 Procedure  PT Treatments  $Gait Training 8-22 mins   Carmelia Bake, PT, DPT 07/04/2014 Pager: [phone]

## 2014-07-04 NOTE — Evaluation (Signed)
Physical Therapy Evaluation Patient Details Name: Devin Mercado MRN: [id number] DOB: [birth year] Today's Date: 07/04/2014   History of Present Illness  Pt is a 63 year old male s/p L direct anterior THA.  Clinical Impression  Pt is s/p L THA resulting in the deficits listed below (see PT Problem List).  Pt will benefit from skilled PT to increase their independence and safety with mobility to allow discharge to the venue listed below.  Pt mobilizing well POD #1 and anticipates d/c home later today after another session.      Follow Up Recommendations Home health PT    Equipment Recommendations  Rolling walker with 5" wheels    Recommendations for Other Services       Precautions / Restrictions Precautions Precautions: None Restrictions Weight Bearing Restrictions: No      Mobility  Bed Mobility Overal bed mobility: Needs Assistance Bed Mobility: Supine to Sit     Supine to sit: Min assist;HOB elevated     General bed mobility comments: verbal cues for technique, assist for L LE  Transfers Overall transfer level: Needs assistance Equipment used: Rolling walker (2 wheeled) Transfers: Sit to/from Stand Sit to Stand: Min guard;From elevated surface         General transfer comment: verbal cues for technique including UE and LE positioning  Ambulation/Gait Ambulation/Gait assistance: Min assist Ambulation Distance (Feet): 100 Feet Assistive device: Rolling walker (2 wheeled) Gait Pattern/deviations: Antalgic;Step-to pattern;Trunk flexed Gait velocity: decr   General Gait Details: verbal cues for sequence, posture, RW positioning, pt reports little pain during gait  Stairs            Wheelchair Mobility    Modified Rankin (Stroke Patients Only)       Balance                                             Pertinent Vitals/Pain Pain Assessment: 0-10 Pain Score: 1  Faces Pain Scale: Hurts even more Pain Location: L hip Pain  Descriptors / Indicators: Sore Pain Intervention(s): Limited activity within patient's tolerance;Monitored during session;Repositioned;Ice applied;Premedicated before session    Home Living Family/patient expects to be discharged to:: Private residence Living Arrangements: Spouse/significant other   Type of Home: House Home Access: Stairs to enter Entrance Stairs-Rails: None Technical brewer of Steps: 3 Home Layout: One level Home Equipment: Toilet riser      Prior Function Level of Independence: Independent               Hand Dominance        Extremity/Trunk Assessment   Upper Extremity Assessment: Overall WFL for tasks assessed           Lower Extremity Assessment: LLE deficits/detail   LLE Deficits / Details: L hip weakness, unable to perform full range against gravity     Communication   Communication: No difficulties  Cognition Arousal/Alertness: Awake/alert Behavior During Therapy: WFL for tasks assessed/performed Overall Cognitive Status: Within Functional Limits for tasks assessed                      General Comments      Exercises Total Joint Exercises Ankle Circles/Pumps: AROM;Both;15 reps Quad Sets: AROM;Both;15 reps Towel Squeeze: AROM;Both;15 reps Short Arc Quad: AROM;Left;15 reps Heel Slides: AAROM;Left;15 reps Hip ABduction/ADduction: AROM;Left;15 reps Long Arc Quad: AROM;Left;15 reps;Seated Marching in Standing: AROM;Seated;Left;10 reps  Assessment/Plan    PT Assessment Patient needs continued PT services  PT Diagnosis Difficulty walking;Acute pain   PT Problem List Decreased strength;Decreased mobility;Decreased knowledge of use of DME;Pain  PT Treatment Interventions Functional mobility training;Gait training;DME instruction;Stair training;Patient/family education;Therapeutic activities;Therapeutic exercise   PT Goals (Current goals can be found in the Care Plan section) Acute Rehab PT Goals PT Goal  Formulation: With patient Time For Goal Achievement: 07/08/14 Potential to Achieve Goals: Good    Frequency 7X/week   Barriers to discharge        Co-evaluation               End of Session   Activity Tolerance: Patient tolerated treatment well Patient left: in chair;with call bell/phone within reach           Time: 0942-1005 PT Time Calculation (min) (ACUTE ONLY): 23 min   Charges:   PT Evaluation $Initial PT Evaluation Tier I: 1 Procedure PT Treatments $Gait Training: 8-22 mins $Therapeutic Exercise: 8-22 mins   PT G Codes:          Davontae Prusinski,KATHrine E 07/04/2014, 1:07 PM Carmelia Bake, PT, DPT 07/04/2014 Pager: [phone]

## 2014-07-04 NOTE — Care Management Note (Signed)
    Page 1 of 2   07/04/2014     10:37:59 AM CARE MANAGEMENT NOTE 07/04/2014  Patient:  Devin Mercado, Devin Mercado   Account Number:  [id number]  Date Initiated:  07/04/2014  Documentation initiated by:  [facility]  Subjective/Objective Assessment:   adm: LEFT TOTAL HIP ARTHROPLASTY ANTERIOR APPROACH (Left)     Action/Plan:   discahrge planning   Anticipated DC Date:  07/04/2014   Anticipated DC Plan:  [place]  CM consult      [facility] Choice  HOME HEALTH   Choice offered to / List presented to:  C-1 Patient   DME arranged  Vassie Moselle      DME agency  [place] arranged  [place]   Status of service:  Completed, signed off Medicare Important Message given?   (If response is "NO", the following Medicare IM given date fields will be blank) Date Medicare IM given:   Medicare IM given by:   Date Additional Medicare IM given:   Additional Medicare IM given by:    Discharge Disposition:  [place]  Per UR Regulation:    If discussed at Long Length of Stay Meetings, dates discussed:    Comments:  07/04/14 08:30 Cm met with pt in room to offer choice of home health agency.  Pt chooses [facility] to render HHPT. Address and contact information verified with pt. CM called [facility] DME rep, Lecretia to please deliver rolling walker to room prior to discharge.  Referral called to [facility] rep, Tim.  No other Cm needs were communicated.  Mariane Masters, BSN, Cm [phone].

## 2014-07-04 NOTE — Evaluation (Signed)
Occupational Therapy Evaluation Patient Details Name: Devin Mercado MRN: [id number] DOB: 1951-02-11 Today's Date: 07/04/2014    History of Present Illness pt is s/p  L DA THA   Clinical Impression   This 63 year old man is s/p L DA THA. All education was completed.  Pt verbalizes keeping walker in front of him for safety and he verbalizes not pushing through pain.  He will have assistance at home for adls.      Follow Up Recommendations  No OT follow up;Supervision/Assistance - 24 hour    Equipment Recommendations  None recommended by OT    Recommendations for Other Services       Precautions / Restrictions Precautions Precautions: Fall Restrictions Weight Bearing Restrictions: No      Mobility Bed Mobility                  Transfers Overall transfer level: Needs assistance Equipment used: Rolling walker (2 wheeled) Transfers: Sit to/from Stand Sit to Stand: Min assist         General transfer comment: assist for LLE when lowering leg to floor and when scooting back in chair.  Supervision for safety beyond this    Balance                                            ADL Overall ADL's : Needs assistance/impaired     Grooming: Wash/dry hands;Wash/dry face;Standing;Supervision/safety   Upper Body Bathing: Supervision/ safety;Standing   Lower Body Bathing: Minimal assistance;Sit to/from stand   Upper Body Dressing : Supervision/safety;Standing   Lower Body Dressing: Moderate assistance;Sit to/from stand   Toilet Transfer: Minimal assistance;Ambulation (assist to prop LLE)   Toileting- Clothing Manipulation and Hygiene: Supervision/safety;Sit to/from stand   Tub/ Shower Transfer: Walk-in shower;Supervision/safety;Ambulation     General ADL Comments: Pt performed ADL:  sat on window seat for LB dressing and stood at sink for most of bathing.  Pt does not have reacher at home and will have wife assist him at home as needed. He stood  to try to use urinal.  Practiced shower transfer.  Safety cues given as pt turned away from walker to help retrieve clothing and to replace urinal.       Vision                     Perception     Praxis      Pertinent Vitals/Pain Pain Assessment: Faces Faces Pain Scale: Hurts even more Pain Location: L hip Pain Descriptors / Indicators: Aching;Spasm Pain Intervention(s): Limited activity within patient's tolerance;Monitored during session;Premedicated before session;Repositioned;RN gave pain meds during session;Ice applied     Hand Dominance     Extremity/Trunk Assessment Upper Extremity Assessment Upper Extremity Assessment: Overall WFL for tasks assessed           Communication Communication Communication: No difficulties   Cognition Arousal/Alertness: Awake/alert Behavior During Therapy: WFL for tasks assessed/performed Overall Cognitive Status: Within Functional Limits for tasks assessed                     General Comments       Exercises       Shoulder Instructions      Home Living Family/patient expects to be discharged to:: Private residence Living Arrangements: Spouse/significant other  Bathroom Shower/Tub: Occupational psychologist: Standard     Home Equipment: Toilet riser (rails around toilet and suction cup grab bar in shower)          Prior Functioning/Environment Level of Independence: Independent             OT Diagnosis: Acute pain;Generalized weakness   OT Problem List:     OT Treatment/Interventions:      OT Goals(Current goals can be found in the care plan section)    OT Frequency:     Barriers to D/C:            Co-evaluation              End of Session    Activity Tolerance: Patient tolerated treatment well Patient left: in chair;with call bell/phone within reach   Time: 1037-1106 OT Time Calculation (min): 29 min Charges:  OT General Charges $OT Visit: 1  Procedure OT Evaluation $Initial OT Evaluation Tier I: 1 Procedure OT Treatments $Self Care/Home Management : 23-37 mins G-Codes:    Dorette Hartel Jul 10, 2014, 11:38 AM   Lesle Chris, OTR/L [phone] Jul 10, 2014

## 2014-07-05 DIAGNOSIS — Z96642 Presence of left artificial hip joint: Secondary | ICD-10-CM | POA: Diagnosis not present

## 2014-07-05 DIAGNOSIS — Z471 Aftercare following joint replacement surgery: Secondary | ICD-10-CM | POA: Diagnosis not present

## 2014-07-05 DIAGNOSIS — E669 Obesity, unspecified: Secondary | ICD-10-CM | POA: Diagnosis not present

## 2014-07-05 DIAGNOSIS — R269 Unspecified abnormalities of gait and mobility: Secondary | ICD-10-CM | POA: Diagnosis not present

## 2014-07-05 DIAGNOSIS — I442 Atrioventricular block, complete: Secondary | ICD-10-CM | POA: Diagnosis not present

## 2014-07-05 DIAGNOSIS — I10 Essential (primary) hypertension: Secondary | ICD-10-CM | POA: Diagnosis not present

## 2014-07-10 DIAGNOSIS — I10 Essential (primary) hypertension: Secondary | ICD-10-CM | POA: Diagnosis not present

## 2014-07-10 DIAGNOSIS — R269 Unspecified abnormalities of gait and mobility: Secondary | ICD-10-CM | POA: Diagnosis not present

## 2014-07-10 DIAGNOSIS — Z96642 Presence of left artificial hip joint: Secondary | ICD-10-CM | POA: Diagnosis not present

## 2014-07-10 DIAGNOSIS — Z471 Aftercare following joint replacement surgery: Secondary | ICD-10-CM | POA: Diagnosis not present

## 2014-07-10 DIAGNOSIS — I442 Atrioventricular block, complete: Secondary | ICD-10-CM | POA: Diagnosis not present

## 2014-07-10 DIAGNOSIS — E669 Obesity, unspecified: Secondary | ICD-10-CM | POA: Diagnosis not present

## 2014-07-12 DIAGNOSIS — E669 Obesity, unspecified: Secondary | ICD-10-CM | POA: Diagnosis not present

## 2014-07-12 DIAGNOSIS — R269 Unspecified abnormalities of gait and mobility: Secondary | ICD-10-CM | POA: Diagnosis not present

## 2014-07-12 DIAGNOSIS — Z96642 Presence of left artificial hip joint: Secondary | ICD-10-CM | POA: Diagnosis not present

## 2014-07-12 DIAGNOSIS — I442 Atrioventricular block, complete: Secondary | ICD-10-CM | POA: Diagnosis not present

## 2014-07-12 DIAGNOSIS — Z471 Aftercare following joint replacement surgery: Secondary | ICD-10-CM | POA: Diagnosis not present

## 2014-07-12 DIAGNOSIS — I10 Essential (primary) hypertension: Secondary | ICD-10-CM | POA: Diagnosis not present

## 2014-07-12 NOTE — Discharge Summary (Signed)
Physician Discharge Summary  Patient ID: Devin Mercado MRN: [id number] DOB/AGE: Mar 27, 1951 63 y.o.  Admit date: 07/03/2014 Discharge date: 07/04/2014   Procedures:  Procedure(s) (LRB): LEFT TOTAL HIP ARTHROPLASTY ANTERIOR APPROACH (Left)  Attending Physician:  Dr. Paralee Mercado   Admission Diagnoses:   Left hip primary OA / pain  Discharge Diagnoses:  Principal Problem:   S/P left THA, AA Active Problems:   Obese  Past Medical History  Diagnosis Date  . HTN (hypertension)   . Hyperlipidemia   . Presence of permanent cardiac pacemaker     JULY 2000 WITH BATTERY CHANGE OCT 2012  . Arthritis     OA AND PAIN LEFT HIP  . CHB (complete heart block)     DR. Beckie Mercado IS PT'S CARDIOLOGIST    HPI: Devin Mercado, 63 y.o. male, has a history of pain and functional disability in the left hip(s) due to arthritis and patient has failed non-surgical conservative treatments for greater than 12 weeks to include NSAID's and/or analgesics and activity modification. Onset of symptoms was gradual starting 2+ years ago with gradually worsening course since that time.The patient noted no past surgery on the left hip(s). Patient currently rates pain in the left hip at 6 out of 10 with activity. Patient has night pain, worsening of pain with activity and weight bearing, trendelenberg gait, pain that interfers with activities of daily living and pain with passive range of motion. Patient has evidence of periarticular osteophytes and joint space narrowing by imaging studies. This condition presents safety issues increasing the risk of falls. There is no current active infection. Risks, benefits and expectations were discussed with the patient. Risks including but not limited to the risk of anesthesia, blood clots, nerve damage, blood vessel damage, failure of the prosthesis, infection and up to and including death. Patient understand the risks, benefits and expectations and wishes to proceed with  surgery.   PCP: Devin Reddish, MD   Discharged Condition: good  Hospital Course:  Patient underwent the above stated procedure on 07/03/2014. Patient tolerated the procedure well and brought to the recovery room in good condition and subsequently to the floor.  POD #1 BP: 130/67 ; Pulse: 60 ; Temp: 98.7 F (37.1 C) ; Resp: 16 Patient reports pain as mild, pain controlled. No events throughout the night. Ready to be discharged home after PT. Dorsiflexion/plantar flexion intact, incision: dressing C/D/I, no cellulitis present and compartment soft.   LABS  Basename    HGB  11.1  HCT  32.7    Discharge Exam: General appearance: alert, cooperative and no distress Extremities: Homans sign is negative, no sign of DVT, no edema, redness or tenderness in the calves or thighs and no ulcers, gangrene or trophic changes  Disposition:   Home with follow up in 2 weeks   Follow-up Information    Follow up with Devin Mercado.   Why:  home health physical therapy   Contact information:   Devin Mercado [id number] [phone]       Follow up with Devin Mercado.   Why:  rolling walker   Contact information:   [address] [place] [address] [phone]       Follow up with Devin Pole, MD. Schedule an appointment as soon as possible for a visit in 2 weeks.   Specialty:  Orthopedic Surgery   Contact information:   [address] [place] [address] [phone]  Discharge Instructions    Call MD / Call 911    Complete by:  As directed   If you experience chest pain or shortness of breath, CALL 911 and be transported to the hospital emergency room.  If you develope a fever above 101 F, pus (white drainage) or increased drainage or redness at the wound, or calf pain, call your surgeon's office.     Change dressing    Complete by:  As directed   Maintain surgical dressing until follow up in the  clinic. If the edges start to pull up, may reinforce with tape. If the dressing is no longer working, may remove and cover with gauze and tape, but must keep the area dry and clean.  Call with any questions or concerns.     Constipation Prevention    Complete by:  As directed   Drink plenty of fluids.  Prune juice may be helpful.  You may use a stool softener, such as Colace (over the counter) 100 mg twice a day.  Use MiraLax (over the counter) for constipation as needed.     Diet - low sodium heart healthy    Complete by:  As directed      Discharge instructions    Complete by:  As directed   Maintain surgical dressing until follow up in the clinic. If the edges start to pull up, may reinforce with tape. If the dressing is no longer working, may remove and cover with gauze and tape, but must keep the area dry and clean.  Follow up in 2 weeks at [facility]. Call with any questions or concerns.     Increase activity slowly as tolerated    Complete by:  As directed      TED hose    Complete by:  As directed   Use stockings (TED hose) for 2 weeks on both leg(s).  You may remove them at night for sleeping.     Weight bearing as tolerated    Complete by:  As directed   Laterality:  left  Extremity:  Lower             Medication List    STOP taking these medications        aspirin 81 MG tablet  Replaced by:  aspirin 325 MG EC tablet     ciprofloxacin 500 MG tablet  Commonly known as:  CIPRO      TAKE these medications        aspirin 325 MG EC tablet  Take 1 tablet (325 mg total) by mouth 2 (two) times daily.     atorvastatin 20 MG tablet  Commonly known as:  LIPITOR  Take 1 tablet (20 mg total) by mouth daily.     DSS 100 MG Caps  Take 100 mg by mouth 2 (two) times daily.     ferrous sulfate 325 (65 FE) MG tablet  Take 1 tablet (325 mg total) by mouth 3 (three) times daily after meals.     HYDROcodone-acetaminophen 7.5-325 MG per tablet  Commonly known as:   NORCO  Take 1-2 tablets by mouth every 4 (four) hours as needed for moderate pain.     lisinopril 10 MG tablet  Commonly known as:  PRINIVIL,ZESTRIL  Take 1 tablet (10 mg total) by mouth daily.     polyethylene glycol packet  Commonly known as:  MIRALAX / GLYCOLAX  Take 17 g by mouth 2 (two) times daily.     tiZANidine 4  MG tablet  Commonly known as:  ZANAFLEX  Take 1 tablet (4 mg total) by mouth every 6 (six) hours as needed for muscle spasms.         Signed: [place] Pugh. Shelie Lansing   PA-C  07/12/2014, 1:15 PM

## 2014-07-13 DIAGNOSIS — Z96642 Presence of left artificial hip joint: Secondary | ICD-10-CM | POA: Diagnosis not present

## 2014-07-13 DIAGNOSIS — I10 Essential (primary) hypertension: Secondary | ICD-10-CM | POA: Diagnosis not present

## 2014-07-13 DIAGNOSIS — I442 Atrioventricular block, complete: Secondary | ICD-10-CM | POA: Diagnosis not present

## 2014-07-13 DIAGNOSIS — Z471 Aftercare following joint replacement surgery: Secondary | ICD-10-CM | POA: Diagnosis not present

## 2014-07-13 DIAGNOSIS — E669 Obesity, unspecified: Secondary | ICD-10-CM | POA: Diagnosis not present

## 2014-07-13 DIAGNOSIS — R269 Unspecified abnormalities of gait and mobility: Secondary | ICD-10-CM | POA: Diagnosis not present

## 2014-07-17 DIAGNOSIS — E669 Obesity, unspecified: Secondary | ICD-10-CM | POA: Diagnosis not present

## 2014-07-17 DIAGNOSIS — I442 Atrioventricular block, complete: Secondary | ICD-10-CM | POA: Diagnosis not present

## 2014-07-17 DIAGNOSIS — Z96642 Presence of left artificial hip joint: Secondary | ICD-10-CM | POA: Diagnosis not present

## 2014-07-17 DIAGNOSIS — Z471 Aftercare following joint replacement surgery: Secondary | ICD-10-CM | POA: Diagnosis not present

## 2014-07-17 DIAGNOSIS — I10 Essential (primary) hypertension: Secondary | ICD-10-CM | POA: Diagnosis not present

## 2014-07-17 DIAGNOSIS — R269 Unspecified abnormalities of gait and mobility: Secondary | ICD-10-CM | POA: Diagnosis not present

## 2014-07-19 DIAGNOSIS — R269 Unspecified abnormalities of gait and mobility: Secondary | ICD-10-CM | POA: Diagnosis not present

## 2014-07-19 DIAGNOSIS — I442 Atrioventricular block, complete: Secondary | ICD-10-CM | POA: Diagnosis not present

## 2014-07-19 DIAGNOSIS — I10 Essential (primary) hypertension: Secondary | ICD-10-CM | POA: Diagnosis not present

## 2014-07-19 DIAGNOSIS — Z96642 Presence of left artificial hip joint: Secondary | ICD-10-CM | POA: Diagnosis not present

## 2014-07-19 DIAGNOSIS — Z471 Aftercare following joint replacement surgery: Secondary | ICD-10-CM | POA: Diagnosis not present

## 2014-07-19 DIAGNOSIS — E669 Obesity, unspecified: Secondary | ICD-10-CM | POA: Diagnosis not present

## 2014-07-20 ENCOUNTER — Other Ambulatory Visit (INDEPENDENT_AMBULATORY_CARE_PROVIDER_SITE_OTHER): Payer: Medicare Other

## 2014-07-20 ENCOUNTER — Telehealth: Payer: Self-pay | Admitting: Family Medicine

## 2014-07-20 ENCOUNTER — Other Ambulatory Visit: Payer: Self-pay | Admitting: Family Medicine

## 2014-07-20 DIAGNOSIS — N401 Enlarged prostate with lower urinary tract symptoms: Secondary | ICD-10-CM

## 2014-07-20 DIAGNOSIS — I10 Essential (primary) hypertension: Secondary | ICD-10-CM

## 2014-07-20 DIAGNOSIS — R351 Nocturia: Secondary | ICD-10-CM

## 2014-07-20 DIAGNOSIS — R972 Elevated prostate specific antigen [PSA]: Secondary | ICD-10-CM

## 2014-07-20 LAB — PSA, MEDICARE: PSA: 8.15 ng/mL — AB (ref 0.10–4.00)

## 2014-07-20 LAB — BASIC METABOLIC PANEL
BUN: 26 mg/dL — ABNORMAL HIGH (ref 6–23)
CO2: 27 meq/L (ref 19–32)
CREATININE: 1.2 mg/dL (ref 0.4–1.5)
Calcium: 9.1 mg/dL (ref 8.4–10.5)
Chloride: 106 mEq/L (ref 96–112)
GFR: 66.98 mL/min (ref >60.00)
GLUCOSE: 111 mg/dL — AB (ref 70–99)
Potassium: 5.2 mEq/L — ABNORMAL HIGH (ref 3.5–5.1)
Sodium: 138 mEq/L (ref 135–145)

## 2014-07-20 NOTE — Telephone Encounter (Signed)
Informed of elevated PSA despite antibiotic course. Will refer to urology

## 2014-07-21 ENCOUNTER — Encounter: Payer: Self-pay | Admitting: Family Medicine

## 2014-07-21 DIAGNOSIS — Z471 Aftercare following joint replacement surgery: Secondary | ICD-10-CM | POA: Diagnosis not present

## 2014-07-21 DIAGNOSIS — Z96642 Presence of left artificial hip joint: Secondary | ICD-10-CM | POA: Diagnosis not present

## 2014-08-16 ENCOUNTER — Other Ambulatory Visit: Payer: Self-pay | Admitting: Urology

## 2014-08-16 DIAGNOSIS — R972 Elevated prostate specific antigen [PSA]: Secondary | ICD-10-CM | POA: Diagnosis not present

## 2014-08-29 ENCOUNTER — Other Ambulatory Visit: Payer: Self-pay | Admitting: Urology

## 2014-08-29 DIAGNOSIS — R972 Elevated prostate specific antigen [PSA]: Secondary | ICD-10-CM

## 2014-09-01 DIAGNOSIS — Z471 Aftercare following joint replacement surgery: Secondary | ICD-10-CM | POA: Diagnosis not present

## 2014-09-01 DIAGNOSIS — Z96642 Presence of left artificial hip joint: Secondary | ICD-10-CM | POA: Diagnosis not present

## 2014-09-05 ENCOUNTER — Ambulatory Visit (HOSPITAL_COMMUNITY)
Admission: RE | Admit: 2014-09-05 | Discharge: 2014-09-05 | Disposition: A | Discharge status: Home / Self Care | Payer: Medicare Other | Source: Ambulatory Visit | Attending: Urology | Admitting: Urology

## 2014-09-05 ENCOUNTER — Encounter (HOSPITAL_COMMUNITY): Payer: Self-pay

## 2014-09-05 DIAGNOSIS — Z8042 Family history of malignant neoplasm of prostate: Secondary | ICD-10-CM | POA: Insufficient documentation

## 2014-09-05 DIAGNOSIS — R972 Elevated prostate specific antigen [PSA]: Secondary | ICD-10-CM

## 2014-09-05 DIAGNOSIS — C61 Malignant neoplasm of prostate: Secondary | ICD-10-CM | POA: Insufficient documentation

## 2014-09-05 HISTORY — PX: PROSTATE BIOPSY: SHX241

## 2014-09-05 MED ORDER — LIDOCAINE HCL (PF) 2 % IJ SOLN
10.0000 mL | Freq: Once | INTRAMUSCULAR | Status: AC
  Administered 2014-09-05: 10 mL

## 2014-09-05 MED ORDER — LIDOCAINE HCL (PF) 2 % IJ SOLN
INTRAMUSCULAR | Status: AC
  Administered 2014-09-05: 10 mL
  Filled 2014-09-05: qty 10

## 2014-09-05 MED ORDER — GENTAMICIN SULFATE 40 MG/ML IJ SOLN
INTRAMUSCULAR | Status: AC
  Administered 2014-09-05: 160 mg via INTRAMUSCULAR
  Filled 2014-09-05: qty 4

## 2014-09-05 MED ORDER — GENTAMICIN SULFATE 40 MG/ML IJ SOLN
160.0000 mg | Freq: Once | INTRAMUSCULAR | Status: AC
Start: 2014-09-05 — End: 2014-09-05
  Administered 2014-09-05: 160 mg via INTRAMUSCULAR

## 2014-09-05 NOTE — Discharge Instructions (Signed)
Transrectal Ultrasound-Guided Biopsy °A transrectal ultrasound-guided biopsy is a procedure to remove samples of tissue from your prostate using ultrasound images to guide the procedure. The procedure is usually done to evaluate the prostate gland of men who have an elevated prostate-specific antigen (PSA). PSA is a blood test to screen for prostate cancer. The biopsy samples are taken to check for prostate cancer.  °LET YOUR HEALTH CARE PROVIDER KNOW ABOUT: °· Any allergies you have. °· All medicines you are taking, including vitamins, herbs, eye drops, creams, and over-the-counter medicines. °· Previous problems you or members of your family have had with the use of anesthetics. °· Any blood disorders you have. °· Previous surgeries you have had. °· Medical conditions you have. °RISKS AND COMPLICATIONS °Generally, this is a safe procedure. However, as with any procedure, problems can occur. Possible problems include: °· Infection of your prostate. °· Bleeding from your rectum or blood in your urine. °· Difficulty urinating. °· Nerve damage (this is usually temporary). °· Damage to surrounding structures such as blood vessels, organs, and muscles, which would require other procedures. °BEFORE THE PROCEDURE °· Do not eat or drink anything after midnight on the night before the procedure or as directed by your health care provider. °· Take medicines only as directed by your health care provider. °· Your health care provider may have you stop taking certain medicines 5-7 days before the procedure. °· You will be given an enema before the procedure. During an enema, a liquid is injected into your rectum to clear out waste. °· You may have lab tests the day of your procedure.   °· Plan to have someone take you home after the procedure. °PROCEDURE  °· You will be given medicine to help you relax (sedative) before the procedure. An IV tube will be inserted into one of your veins and used to give fluids and  medicine. °· You will be given antibiotic medicine to reduce the risk of an infection. °· You will be placed on your side for the procedure. °· A probe with lubricated gel will be placed into your rectum, and images will be taken of your prostate and surrounding structures. °· Numbing medicine will be injected into the prostate before the biopsy samples are taken. °· A biopsy needle will then be inserted and guided to your prostate with the use of the ultrasound images. °· Samples of prostate tissue will be taken, and the needle will then be removed. °· The biopsy samples will be sent to a lab to be analyzed. Results are usually back in 2-3 days. °AFTER THE PROCEDURE °· You will be taken to a recovery area where you will be monitored. °· You may have some discomfort in the rectal area. You will be given pain medicines to control this. °· You may be allowed to go home the same day, or you may need to stay in the hospital overnight. °Document Released: 11/14/2013 Document Reviewed: 02/16/2013 °ExitCare® Patient Information ©2015 [employer]. This information is not intended to replace advice given to you by your health care provider. Make sure you discuss any questions you have with your health care provider. ° °

## 2014-09-12 ENCOUNTER — Encounter: Payer: Self-pay | Admitting: Family Medicine

## 2014-09-12 DIAGNOSIS — C61 Malignant neoplasm of prostate: Secondary | ICD-10-CM | POA: Insufficient documentation

## 2014-09-12 HISTORY — DX: Malignant neoplasm of prostate: C61

## 2014-09-13 ENCOUNTER — Telehealth: Payer: Self-pay | Admitting: Family Medicine

## 2014-09-13 ENCOUNTER — Other Ambulatory Visit (INDEPENDENT_AMBULATORY_CARE_PROVIDER_SITE_OTHER): Payer: Medicare Other

## 2014-09-13 ENCOUNTER — Other Ambulatory Visit: Payer: Self-pay | Admitting: Urology

## 2014-09-13 DIAGNOSIS — C61 Malignant neoplasm of prostate: Secondary | ICD-10-CM

## 2014-09-13 DIAGNOSIS — I10 Essential (primary) hypertension: Secondary | ICD-10-CM

## 2014-09-13 LAB — BASIC METABOLIC PANEL
BUN: 35 mg/dL — ABNORMAL HIGH (ref 6–23)
CO2: 25 mEq/L (ref 19–32)
Calcium: 9.3 mg/dL (ref 8.4–10.5)
Chloride: 106 mEq/L (ref 96–112)
Creatinine, Ser: 1.33 mg/dL (ref 0.40–1.50)
GFR: 57.18 mL/min — AB (ref >60.00)
Glucose, Bld: 110 mg/dL — ABNORMAL HIGH (ref 70–99)
POTASSIUM: 3.9 meq/L (ref 3.5–5.1)
Sodium: 137 mEq/L (ref 135–145)

## 2014-09-13 NOTE — Telephone Encounter (Signed)
Spoke with Dr. [name] and Dr. Diona Fanti.   1. We will arrange CT abdomen/pelvis with contrast through our office. This needs to be faxed 2. Dr. Shirley Muscat will flag his staff to schedule bone scan sooner 3. Dr. Diona Fanti has already called Mr. Withers to arrange for sooner consultation  If patient needs further labs, please contact me directly not nursing staff. His Creatinine was 1.2 on 07/20/14 and I assume this will be sufficient.

## 2014-09-13 NOTE — Addendum Note (Signed)
Addended by: [name] on: 09/13/2014 01:42 PM   Modules accepted: Orders

## 2014-09-13 NOTE — Telephone Encounter (Signed)
Relayed information to patient. Still awaiting scheduling. Once again-communication plan will be through scheduler and myself only and to exclude nursing staff.

## 2014-09-14 ENCOUNTER — Ambulatory Visit (INDEPENDENT_AMBULATORY_CARE_PROVIDER_SITE_OTHER)
Admission: RE | Admit: 2014-09-14 | Discharge: 2014-09-14 | Disposition: A | Discharge status: Home / Self Care | Payer: Medicare Other | Source: Ambulatory Visit | Attending: Family Medicine | Admitting: Family Medicine

## 2014-09-14 ENCOUNTER — Other Ambulatory Visit: Payer: Medicare Other

## 2014-09-14 DIAGNOSIS — C61 Malignant neoplasm of prostate: Secondary | ICD-10-CM | POA: Diagnosis not present

## 2014-09-14 MED ORDER — IOHEXOL 300 MG/ML  SOLN
100.0000 mL | Freq: Once | INTRAMUSCULAR | Status: AC | PRN
  Administered 2014-09-14: 100 mL via INTRAVENOUS

## 2014-09-15 ENCOUNTER — Encounter (HOSPITAL_COMMUNITY): Payer: Self-pay

## 2014-09-15 ENCOUNTER — Encounter (HOSPITAL_COMMUNITY)
Admission: RE | Admit: 2014-09-15 | Discharge: 2014-09-15 | Disposition: A | Discharge status: Home / Self Care | Payer: Medicare Other | Source: Ambulatory Visit | Attending: Urology | Admitting: Urology

## 2014-09-15 DIAGNOSIS — C61 Malignant neoplasm of prostate: Secondary | ICD-10-CM | POA: Insufficient documentation

## 2014-09-15 DIAGNOSIS — Z8546 Personal history of malignant neoplasm of prostate: Secondary | ICD-10-CM | POA: Diagnosis not present

## 2014-09-15 MED ORDER — TECHNETIUM TC 99M MEDRONATE IV KIT
25.0000 | PACK | Freq: Once | INTRAVENOUS | Status: AC | PRN
  Administered 2014-09-15: 25 via INTRAVENOUS

## 2014-09-18 ENCOUNTER — Ambulatory Visit (HOSPITAL_COMMUNITY)
Admission: RE | Admit: 2014-09-18 | Discharge: 2014-09-18 | Disposition: A | Discharge status: Home / Self Care | Payer: Medicare Other | Source: Ambulatory Visit | Attending: Urology | Admitting: Urology

## 2014-09-18 ENCOUNTER — Other Ambulatory Visit: Payer: Self-pay | Admitting: Urology

## 2014-09-18 DIAGNOSIS — R937 Abnormal findings on diagnostic imaging of other parts of musculoskeletal system: Secondary | ICD-10-CM | POA: Insufficient documentation

## 2014-09-18 DIAGNOSIS — R948 Abnormal results of function studies of other organs and systems: Secondary | ICD-10-CM

## 2014-09-18 DIAGNOSIS — C61 Malignant neoplasm of prostate: Secondary | ICD-10-CM | POA: Insufficient documentation

## 2014-09-18 DIAGNOSIS — Z8546 Personal history of malignant neoplasm of prostate: Secondary | ICD-10-CM | POA: Diagnosis not present

## 2014-09-19 ENCOUNTER — Institutional Professional Consult (permissible substitution) (INDEPENDENT_AMBULATORY_CARE_PROVIDER_SITE_OTHER): Payer: Medicare Other | Admitting: Urology

## 2014-09-19 DIAGNOSIS — C61 Malignant neoplasm of prostate: Secondary | ICD-10-CM

## 2014-09-21 ENCOUNTER — Telehealth: Payer: Self-pay | Admitting: Medical Oncology

## 2014-09-21 ENCOUNTER — Other Ambulatory Visit: Payer: Self-pay | Admitting: Family Medicine

## 2014-09-21 MED ORDER — SCOPOLAMINE 1 MG/3DAYS TD PT72: 1.0000 | MEDICATED_PATCH | TRANSDERMAL | Status: DC

## 2014-09-25 NOTE — Telephone Encounter (Signed)
I called pt to introduce myself as the Prostate Nurse Navigator and the Coordinator of the Prostate [place].  1. I confirmed with the patient he is aware of his referral to the clinic 10/03/14 arriving at 12:11pm.  2. I discussed the format of the clinic and the physicians he will be seeing that day.  3. I discussed where the clinic is located and how to contact me. I asked him to have lunch before he comes due to the length of the clinic.  4. I confirmed his address and informed him I would be mailing a packet of information and forms to be completed. I asked him to bring them with him the day of his appointment.   He voiced understanding of the above. I asked him to call me if he has any questions or concerns regarding his appointments or the forms he needs to complete.    Cira Rue, RN, BSN, [place]  [phone]  Fax [phone]

## 2014-10-02 ENCOUNTER — Telehealth: Payer: Self-pay | Admitting: Medical Oncology

## 2014-10-02 ENCOUNTER — Encounter: Payer: Self-pay | Admitting: Radiation Oncology

## 2014-10-02 NOTE — Progress Notes (Signed)
GU Location of Tumor / Histology: Adenocarcinoma of the Prostate  If Prostate Cancer, Gleason Score is (4 +4= 8) and PSA is (07/20/14 - 8.15) and prostate volume is 37 mL  Devin Mercado presented with an elevated PSA of 2.68 on 06/08/12, 3.21 on 05/13/13, 5.53 on 06/28/14 and 8.15 on 07/20/14  Biopsies revealed:  09/05/14   Past/Anticipated interventions by urology, if any: 09/05/14 - prostate biopsy.   Past/Anticipated interventions by medical oncology, if any: surgery or radiotherapy and androgen deprivation therapy  Weight changes, if any: None  Bowel/Bladder complaints, if any: IPSS Score- 7  Nausea/Vomiting, if any: None  Pain issues, if any: No  SAFETY ISSUES:  Prior radiation? No  Pacemaker/ICD? yes  Possible current pregnancy? no  Is the patient on methotrexate? no  Current Complaints / other details:  Patient had a left hip arthroplasty on 07/03/14.  Family history of prostate cancer in her father.

## 2014-10-02 NOTE — Telephone Encounter (Signed)
Called Devin Mercado to confirm his appointment for the [facility] 10/03/14. I reminded him to bring his completed medical forms and to eat lunch due to the length of the clinic. He states he thinks he has an appointment with [name] at 11:45 am at [facility]. I explained that he will see [name] in the clinic and I will call [facility] to verify this appointment. He voiced understanding. I called [facility] to cancel pt's appointment.  Cira Rue, RN, BSN, [place]  [phone]  Fax [phone]

## 2014-10-03 ENCOUNTER — Encounter: Payer: Self-pay | Admitting: Radiation Oncology

## 2014-10-03 ENCOUNTER — Encounter: Payer: Self-pay | Admitting: Medical Oncology

## 2014-10-03 ENCOUNTER — Ambulatory Visit
Admission: RE | Admit: 2014-10-03 | Discharge: 2014-10-03 | Disposition: A | Discharge status: Home / Self Care | Payer: Medicare Other | Source: Ambulatory Visit | Attending: Radiation Oncology | Admitting: Radiation Oncology

## 2014-10-03 ENCOUNTER — Ambulatory Visit (HOSPITAL_BASED_OUTPATIENT_CLINIC_OR_DEPARTMENT_OTHER): Payer: Medicare Other | Admitting: Oncology

## 2014-10-03 VITALS — BP 147/84 | HR 83 | Temp 98.4°F | Resp 20 | Ht 72.0 in | Wt 224.9 lb

## 2014-10-03 DIAGNOSIS — C61 Malignant neoplasm of prostate: Secondary | ICD-10-CM | POA: Insufficient documentation

## 2014-10-03 HISTORY — DX: Malignant neoplasm of prostate: C61

## 2014-10-03 NOTE — Progress Notes (Signed)
[facility] NEW PATIENT EVALUATION  Name: Devin Alban. Mercado MRN: [id number]  Date:   10/03/2014           DOB: May 02, 1951  Status: outpatient   CC: Garret Reddish, MD  [name], MD Dr. Franchot Gallo   REFERRING PHYSICIAN: [name], MD   DIAGNOSIS: Stage T1c high-risk adenocarcinoma prostate   HISTORY OF PRESENT ILLNESS:  Devin Mercado is a 64 y.o. male who is seen today at the prostate multidisciplinary clinic through the courtesy of [name] for discussion of possible radiation therapy in the management of his stage T1c high risk adenocarcinoma prostate.  His PSA in October 2014 was 3.21.  This rose to 8.15 by 07/20/2014.  He was seen by Dr. Diona Fanti who performed ultrasound-guided biopsies on 09/05/2014.  His then have Gleason 8 (4+4) involving 40% of one core from the left lateral mid gland.  He also had Gleason 7 (4+3) involving 70% of one core from the left lateral base, 20% of one core from the left base, 20% of one core from left lateral apex and 10% of one core from the left apex.  His gland volume was 37 mL.  He is doing reasonably well from a GU and GI standpoint.  A recent I PSS score is 14, but his I PSS score today is 7.  His staging workup included a negative CT scan of the abdomen/pelvis and bone scan.  He is potent and his SHIM score is 19.  Of note is that he did have hip replacement surgery in 2015.  He also has a pacemaker for cardiac arrhythmia with replacement of the pacemaker generator in 2012.   PREVIOUS RADIATION THERAPY: No   PAST MEDICAL HISTORY:  has a past medical history of HTN (hypertension); Hyperlipidemia; Presence of permanent cardiac pacemaker; Arthritis; CHB (complete heart block); and Prostate cancer.     PAST SURGICAL HISTORY:  Past Surgical History  Procedure Laterality Date  . Permanent pacemaker    . Adenoidectomy as a child    . Wisdom teeth extractions    . Total hip arthroplasty Left  07/03/2014    Procedure: LEFT TOTAL HIP ARTHROPLASTY ANTERIOR APPROACH;  Surgeon: [name], MD;  Location: [facility];  Service: Orthopedics;  Laterality: Left;  . Prostate biopsy  09/05/14     FAMILY HISTORY: family history includes Heart disease in his mother; Prostate cancer in his father; Stroke in his mother.   SOCIAL HISTORY:  reports that he has never smoked. He has never used smokeless tobacco. He reports that he drinks about 1.2 - 1.8 oz of alcohol per week. He reports that he does not use illicit drugs.   ALLERGIES: Review of patient's allergies indicates no known allergies.   MEDICATIONS:  Current Outpatient Prescriptions  Medication Sig Dispense Refill  . aspirin 81 MG tablet Take 81 mg by mouth daily.    Marland Kitchen atorvastatin (LIPITOR) 20 MG tablet Take 1 tablet (20 mg total) by mouth daily. 90 tablet 3  . lisinopril (PRINIVIL,ZESTRIL) 10 MG tablet Take 1 tablet (10 mg total) by mouth daily. 90 tablet 3  . docusate sodium 100 MG CAPS Take 100 mg by mouth 2 (two) times daily. (Patient not taking: Reported on 10/03/2014) 10 capsule 0  . ferrous sulfate 325 (65 FE) MG tablet Take 1 tablet (325 mg total) by mouth 3 (three) times daily after meals. (Patient not taking: Reported on 10/03/2014)  3  . HYDROcodone-acetaminophen (NORCO) 7.5-325 MG per  tablet Take 1-2 tablets by mouth every 4 (four) hours as needed for moderate pain. (Patient not taking: Reported on 10/03/2014) 100 tablet 0  . polyethylene glycol (MIRALAX / GLYCOLAX) packet Take 17 g by mouth 2 (two) times daily. (Patient not taking: Reported on 10/03/2014) 14 each 0  . scopolamine (TRANSDERM-SCOP) 1 MG/3DAYS Place 1 patch (1.5 mg total) onto the skin every 3 (three) days. To prevent motion sickness while on cruise (Patient not taking: Reported on 10/03/2014) 4 patch 0  . tiZANidine (ZANAFLEX) 4 MG tablet Take 1 tablet (4 mg total) by mouth every 6 (six) hours as needed for muscle spasms. (Patient not taking: Reported on 10/03/2014)  30 tablet 0   No current facility-administered medications for this encounter.     REVIEW OF SYSTEMS:  Pertinent items are noted in HPI.    PHYSICAL EXAM:  height is 6' (1.829 m) and weight is 224 lb 14.4 oz (102.014 kg). His oral temperature is 98.4 F (36.9 C). His blood pressure is 147/84 and his pulse is 83. His respiration is 20.   Rectal examination is not performed today.   LABORATORY DATA:  Lab Results  Component Value Date   WBC 10.8* 07/04/2014   HGB 11.1* 07/04/2014   HCT 32.7* 07/04/2014   MCV 85.8 07/04/2014   PLT 171 07/04/2014   Lab Results  Component Value Date   NA 137 09/13/2014   K 3.9 09/13/2014   CL 106 09/13/2014   CO2 25 09/13/2014   Lab Results  Component Value Date   ALT 19 05/29/2014   AST 19 05/29/2014   ALKPHOS 54 05/29/2014   BILITOT 1.0 05/29/2014   PSA 8.15 from 07/20/2014   IMPRESSION: Stage T1c high risk adenocarcinoma prostate.  I explained to the patient that his prognosis is related to his stage, PSA level, and Gleason score.  His stage and PSA level are favorable while his Gleason score of 8 is unfavorable.  Other prognostic factors include disease volume and PSA doubling time.  His PSA doubling time is approaching 1 year.  He has high-risk disease.  We discussed surgery versus radiation therapy.  Radiation therapy options include 5 weeks of external beam followed by a seed implant boost or 8 weeks of external beam/IMRT.  With either radiation therapy option he should be considered for androgen deprivation therapy which ideally should be given for 2 years.  We discussed the potential acute and late toxicities of radiation therapy, and also androgen deprivation therapy.  Surgery and radiation therapy should be equally curative.  With surgery, he may avoid androgen deprivation therapy.  On other hand, he may require postoperative radiation therapy depending on his pathologic stage.  PLAN:  as discussed above.  He will think things over and  get back in touch with [name] regarding surgery or me if he wants to consider radiation therapy.   I spent 30  minutes face to face with the patient and more than 50% of that time was spent in counseling and/or coordination of care.

## 2014-10-03 NOTE — Progress Notes (Signed)
Reason for Referral: Prostate cancer.   HPI: 64 year old gentleman native of [place] currently lives in [place]. He is a gentleman with hyperlipidemia and hypertension with a history of pacemaker placement because of cardiac arrhythmia. He also had a total hip replacement in December 2015. He was found to have an elevated PSA initially 3.12 subsequently was up to 8.15. He had a normal digital rectal examination and subsequently was evaluated by Dr. Diona Fanti and underwent a biopsy on February 2016. The pathology revealed a Gleason score 4+4 = 8 in 1 core and 4+3 = 7 in 4 other cores. His staging workup including CT scan and bone scan did not show any evidence of metastatic disease. He is asymptomatic at this time from his prostate cancer. He does not report any frequency urgency or hesitancy. He does not report any nocturia or dysuria. He does not report any skeletal complaints of arthralgias or myalgias. Does not report any fevers, chills, sweats or weight loss. Does not report any chest pain or palpitation. Does not report any nausea or vomiting or abdominal pain. Does not report any hematochezia or melena. Remaining review of systems unremarkable.   Past Medical History  Diagnosis Date  . HTN (hypertension)   . Hyperlipidemia   . Presence of permanent cardiac pacemaker     JULY 2000 WITH BATTERY CHANGE OCT 2012  . Arthritis     OA AND PAIN LEFT HIP  . CHB (complete heart block)     DR. Beckie Salts IS PT'S CARDIOLOGIST  . Prostate cancer   :  Past Surgical History  Procedure Laterality Date  . Permanent pacemaker    . Adenoidectomy as a child    . Wisdom teeth extractions    . Total hip arthroplasty Left 07/03/2014    Procedure: LEFT TOTAL HIP ARTHROPLASTY ANTERIOR APPROACH;  Surgeon: [name], MD;  Location: [facility];  Service: Orthopedics;  Laterality: Left;  . Prostate biopsy  09/05/14  :   Current outpatient prescriptions:  .  aspirin 81 MG tablet, Take 81 mg by mouth  daily., Disp: , Rfl:  .  atorvastatin (LIPITOR) 20 MG tablet, Take 1 tablet (20 mg total) by mouth daily., Disp: 90 tablet, Rfl: 3 .  docusate sodium 100 MG CAPS, Take 100 mg by mouth 2 (two) times daily. (Patient not taking: Reported on 10/03/2014), Disp: 10 capsule, Rfl: 0 .  ferrous sulfate 325 (65 FE) MG tablet, Take 1 tablet (325 mg total) by mouth 3 (three) times daily after meals. (Patient not taking: Reported on 10/03/2014), Disp: , Rfl: 3 .  HYDROcodone-acetaminophen (NORCO) 7.5-325 MG per tablet, Take 1-2 tablets by mouth every 4 (four) hours as needed for moderate pain. (Patient not taking: Reported on 10/03/2014), Disp: 100 tablet, Rfl: 0 .  lisinopril (PRINIVIL,ZESTRIL) 10 MG tablet, Take 1 tablet (10 mg total) by mouth daily., Disp: 90 tablet, Rfl: 3 .  polyethylene glycol (MIRALAX / GLYCOLAX) packet, Take 17 g by mouth 2 (two) times daily. (Patient not taking: Reported on 10/03/2014), Disp: 14 each, Rfl: 0 .  scopolamine (TRANSDERM-SCOP) 1 MG/3DAYS, Place 1 patch (1.5 mg total) onto the skin every 3 (three) days. To prevent motion sickness while on cruise (Patient not taking: Reported on 10/03/2014), Disp: 4 patch, Rfl: 0 .  tiZANidine (ZANAFLEX) 4 MG tablet, Take 1 tablet (4 mg total) by mouth every 6 (six) hours as needed for muscle spasms. (Patient not taking: Reported on 10/03/2014), Disp: 30 tablet, Rfl: 0:  No Known Allergies:  Family  History  Problem Relation Age of Onset  . Stroke Mother     multiple. 60.   . Heart disease Mother     hx heart valve problem  . Prostate cancer Father     70 of prostate cancer  :  History   Social History  . Marital Status: Married    Spouse Name: N/A  . Number of Children: 4  . Years of Education: N/A   Occupational History  . attorney    Social History Main Topics  . Smoking status: Never Smoker   . Smokeless tobacco: Never Used  . Alcohol Use: 1.2 - 1.8 oz/week    2-3 Standard drinks or equivalent per week     Comment: 5 TO 7  DRINKS A WEEK  . Drug Use: No  . Sexual Activity: Not on file   Other Topics Concern  . Not on file   Social History Narrative   Married to Dr. [name] 1979. 4 kids (1 lost to auto accident-3 surviving), no grandkids.       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  Exam: There were no vitals taken for this visit. General appearance: alert and cooperative Head: Normocephalic, without obvious abnormality Throat: lips, mucosa, and tongue normal; teeth and gums normal Neck: no adenopathy Back: negative Resp: clear to auscultation bilaterally Chest wall: no tenderness Cardio: regular rate and rhythm, S1, S2 normal, no murmur, click, rub or gallop GI: soft, non-tender; bowel sounds normal; no masses,  no organomegaly Extremities: extremities normal, atraumatic, no cyanosis or edema Pulses: 2+ and symmetric Skin: Skin color, texture, turgor normal. No rashes or lesions Lymph nodes: Cervical, supraclavicular, and axillary nodes normal.    Dg Ribs Unilateral Right  09/18/2014   CLINICAL DATA:  History of prostate carcinoma. Recent bone scan showed increased uptake in the anterior aspect of the right fourth rib.  EXAM: RIGHT RIBS - 2 VIEW  COMPARISON:  None.  FINDINGS: Minimal cortical irregularity involving the superior cortex of the anterior right fourth rib noted in a region consistent with the bone scan uptake. This likely reflects old fracture. No evidence of acute fracture or focal bone lesions. Underlying lung appears normal. No pleural abnormalities.  IMPRESSION: Minimal cortical irregularity involving the anterior right fourth rib in the region of bone scan uptake. This likely corresponds to an old injury.   Electronically Signed   By: Aletta Edouard M.D.   On: 09/18/2014 14:29   Nm Bone Scan Whole Body  09/15/2014   CLINICAL DATA:  History of prostate malignancy, no bones symptoms today  EXAM: NUCLEAR MEDICINE WHOLE BODY BONE  SCAN  TECHNIQUE: Whole body anterior and posterior images were obtained approximately 3 hours after intravenous injection of radiopharmaceutical.  RADIOPHARMACEUTICALS:  Twenty-five mCi Technetium-99 MDP  COMPARISON:  CT scan of the abdomen and pelvis dated September 14, 2014  FINDINGS: There is adequate uptake of the radiopharmaceutical by the skeleton. There is adequate soft tissue clearance and renal activity.  Uptake over the calvarium, spine, and pectoral girdle is within the limits of normal. There is a tiny focus of increased uptake in the anterior aspect of the right fourth rib. The uptake intensity is relatively low. There is mildly increased uptake associated with the left hip which is a site of a prosthetic joint. This relatively low-level diffuse uptake. There is no abnormal uptake over the femurs or tibias or fibulas to  suggest metastatic disease.  IMPRESSION: There are no bone scan findings highly suspicious for metastatic disease. A single focus of low-level increased uptake in the anterior aspect of the right fourth rib merits a right rib series.   Electronically Signed   By: David  Martinique   On: 09/15/2014 14:22   Ct Abdomen Pelvis W Contrast  09/14/2014   CLINICAL DATA:  Newly diagnosed prostate carcinoma.  EXAM: CT ABDOMEN AND PELVIS WITH CONTRAST  TECHNIQUE: Multidetector CT imaging of the abdomen and pelvis was performed using the standard protocol following bolus administration of intravenous contrast.  CONTRAST:  100 mL Omnipaque 300  COMPARISON:  None.  FINDINGS: Lower Chest:  Unremarkable.  Hepatobiliary: Several tiny cysts are noted, however no liver masses are identified. Gallbladder is unremarkable.  Pancreas: No mass, inflammatory changes, or other significant abnormality identified.  Spleen:  Within normal limits in size and appearance.  Adrenals:  No masses identified.  Kidneys/Urinary Tract:  No evidence of masses or hydronephrosis.  Stomach/Bowel/Peritoneum: No evidence of wall  thickening, mass, or obstruction.  Vascular/Lymphatic: No pathologically enlarged lymph nodes identified. No other significant abnormality visualized.  Reproductive: Prostate and seminal vesicles are unremarkable in appearance.  Other: Beam hardening artifact in the inferior pelvis from left hip prosthesis.  Musculoskeletal:  No suspicious bone lesions identified.  IMPRESSION: No evidence of metastatic prostate carcinoma or other significant abnormality within the abdomen or pelvis.   Electronically Signed   By: Earle Gell M.D.   On: 09/14/2014 14:24    Assessment and Plan:    64 year old gentleman diagnosed with prostate cancer with a Gleason score 4+4 = 8 in 1 core and 4+3 = 7 and 4 other cores. His PSA is up to 8.15 without any evidence of metastatic disease. His case was discussed today and the prostate cancer multidisciplinary clinic. Imaging studies were reviewed with radiology and his pathology was discussed with the reviewing pathologist. Options of treatments were discussed today including surgery versus radiation therapy and androgen deprivation. We discussed the risks and benefits of both approaches I emphasized the complications associated with androgen deprivation. I explained to him the literature describing the duration of androgen deprivation ranging between 6 months up to 24 months. Complications includes night sweats, fatigue, weight gain, osteoporosis and rarely cardiac complications. It is felt that surgery could avoid having to have androgen deprivation at this point. He also understands postoperatively he might require adjuvant or salvage radiation therapy depending on his final pathology. He will think about his options today and make a decision near future.

## 2014-10-03 NOTE — Addendum Note (Signed)
Encounter addended by: [name], MD on: 10/03/2014 11:15 PM<BR>     Documentation filed: Clinical Notes

## 2014-10-03 NOTE — Progress Notes (Unsigned)
                               Care Plan Summary  Name: Devin Mercado DOB: Nov 24, 1950   Your Medical Team:   Urologist -  Dr. [name], [facility]  Radiation Oncologist - Dr. Arloa Koh, [facility]   Medical Oncologist - Dr. Zola Button, [place]  Recommendations: 1) Robotic Prostatectomy  2) Radiation Therapy   * These recommendations are based on information available as of today's consult.      Recommendations may change depending on the results of further tests or exams. Next Steps: 1) Dr. Lynne Logan office will contact you to schedule surgery   When appointments need to be scheduled, you will be contacted by [facility] and/or [facility].  Questions?  Please do not hesitate to call Cira Rue, RN, BSN, CRNI at [phone] any questions or concerns.  Shirlean Mylar is your Oncology Nurse Navigator and is available to assist you while you're receiving your medical care at [facility].

## 2014-10-03 NOTE — Consult Note (Signed)
Chief Complaint  Prostate Cancer   Reason For Visit  Reason for consult: To discuss treatment options for high risk clinically localized prostate cancer. Physician requesting consult: Dr. Franchot Gallo PCP: Dr. 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 His DRE was normal.  He underwent a prostate needle biopsy by Dr. Diona Fanti on 09/05/14 due to his PSA elevation.  His biopsy confirmed Gleason 4+4=8 adenocarcinoma with 5 out of 12 biopsy cores (all left sided) positive for malignancy. He underwent staging studies including a bone scan on 2014-09-24 that was noted to be unremarkable except for a focus of uptake at the anterior right 4th rib.  Plain films were not suggestive of metastasis. A CT scan of the abdomen and pelvis on 09/14/14 was negative for evidence of metastatic disease.  His father died of metastatic prostate cancer at age 70, having undergone radiation seeds and developing recurrence and metastatic disease.  His PMH is significant for complete heart block managed by Dr. Cristopher Peru and s/p pacemaker placement. He is 100% paced.  He also has a history of dyslipidemia and hypertension.  TNM stage: cT1c Nx Mx PSA: 8.15 Gleason score: 4+4=8 Biopsy (09/05/14): 5/12 cores positive -- L lateral apex (20%, 4+3=7), L apex (10%, 4+3=7), L lateral mid (40%, 4+4=8), L lateral base (70% DI, 4+3=7), L base (20%, 4+3=7) Prostate volume: 37 cc  Nomogram OC disease: 21% EPE: 76% SVI: 19% LNI: 20% PFS (surgery): 42% at 5 years, 28% at 10 years  Urinary funciton: IPSS is 7. Erectile function: SHIM score is 19   Past Medical History  1. History of complete atrioventricular block (Z86.79)  2. History of hypercholesterolemia (Z86.39)  3. History of hypertension  (Z86.79)  Surgical History  1. History of Cardioverter-defibrillator Pulse Generator Replacement  2. History of Pacemaker Placement  3. History of Total Hip Replacement  Current Meds  1. Aspirin 81 MG Oral Tablet;  Therapy: (Recorded:03Feb2016) to Recorded  2. Atorvastatin Calcium 20 MG Oral Tablet;  Therapy: (Recorded:03Feb2016) to Recorded  3. Levofloxacin 500 MG Oral Tablet; take one tab by mouth the day before procedure, take  one tab by mouth the day of procedure, take one tab by mouth the after the procedure;  Therapy: [id number] to (Last Rx:03Feb2016)  Requested for: 03Feb2016 Ordered  4. Lisinopril 10 MG Oral Tablet;  Therapy: (Recorded:03Feb2016) to Recorded  Allergies  1. No Known Drug Allergies  Family History  1. Family history of Death of parent : Mother, Father  2. Family history of prostate cancer (Z80.42) : Father  Social History   Alcohol use (Z78.9)   Caffeine use (F15.90)   Never a smoker   Three children  Review of Systems AU Complete-Male: Genitourinary, constitutional, skin, eye, otolaryngeal, hematologic/lymphatic, cardiovascular, pulmonary, endocrine, musculoskeletal, gastrointestinal, neurological and psychiatric system(s) were reviewed and pertinent findings if present are noted and are otherwise negative.    Physical Exam Constitutional: Well nourished and well developed . No acute distress.  ENT:. The ears and nose are normal in appearance.  Neck: The appearance of the neck is normal and no neck mass is present.  Pulmonary: No respiratory distress, normal respiratory rhythm and effort and clear bilateral breath sounds.  Cardiovascular: Heart rate and rhythm are normal . No peripheral edema.  Abdomen: The abdomen is soft and nontender. No masses are palpated. No CVA tenderness. No hernias are palpable. No hepatosplenomegaly noted.  Rectal: Rectal exam demonstrates normal sphincter tone, no tenderness and no masses. Prostate size is estimated to  be 45 g. Normal rectal tone, no rectal masses, prostate is smooth, symmetric and non-tender. The prostate has no nodularity and is not tender. The left seminal vesicle is nonpalpable. The right seminal vesicle is nonpalpable. The perineum is normal on inspection.  Lymphatics: The femoral and inguinal nodes are not enlarged or tender.  Skin: Normal skin turgor, no visible rash and no visible skin lesions.  Neuro/Psych:. Mood and affect are appropriate.    Results/Data  I have independently reviewed his medical records, PSA results, pathology slides, and imaging studies in the [facility] conference today.     Discussion/Summary  1. Prostate cancer:  Devin Mercado has high risk clinically localized prostate cancer.   The patient was counseled about the natural history of prostate cancer and the standard treatment options that are available for prostate cancer. It was explained to him how his age and life expectancy, clinical stage, Gleason score, and PSA affect his prognosis, the decision to proceed with additional staging studies, as well as how that information influences recommended treatment strategies. We discussed the roles for active surveillance, radiation therapy, surgical therapy, androgen deprivation, as well as ablative therapy options for the treatment of prostate cancer as appropriate to his individual cancer situation. We discussed the risks and benefits of these options with regard to their impact on cancer control and also in terms of potential adverse events, complications, and impact on quiality of life particularly related to urinary, bowel, and sexual function. The patient was encouraged to ask questions throughout the discussion today and all questions were answered to his stated satisfaction. In addition, the patient was provided with and/or directed to appropriate resources and literature for further education about prostate cancer and treatment options.   We discussed surgical therapy for  prostate cancer including the different available surgical approaches. We discussed, in detail, the risks and expectations of surgery with regard to cancer control, urinary control, and erectile function as well as the expected postoperative recovery process. Additional risks of surgery including but not limited to bleeding, infection, hernia formation, nerve damage, lymphocele formation, bowel/rectal injury potentially necessitating colostomy, damage to the urinary tract resulting in urine leakage, urethral stricture, and the cardiopulmonary risks such as myocardial infarction, stroke, death, venothromboembolism, etc. were explained. The risk of open surgical conversion for robotic/laparoscopic prostatectomy was also discussed.   He has met with Dr. Valere Dross and Dr. Alen Blew today in the clinic and elects to proceed with surgical therapy.  He feels well informed. I would usually recommend an MRI of the prostate in this situation for surgical planning except that he has a pacemaker which does not allow him to have an MRI.  We discussed our plan to perform a bilateral nerve sparing procedure but with careful attention to the left side or partial nerve sparing. He will be scheduled for a RAL radical prostatectomy and BPLND.    cc: Dr. Arloa Koh Dr. Zola Button Dr. Garret Reddish Dr. Franchot Gallo  A total of 65 minutes were spent in the overall care of the patient today with 55 minutes in direct face to face consultation.    Signatures Electronically signed by : Devin Mercado, M.D.; Oct 03 2014 11:12PM EST

## 2014-10-04 ENCOUNTER — Other Ambulatory Visit: Payer: Self-pay | Admitting: Urology

## 2014-10-11 ENCOUNTER — Encounter: Payer: Self-pay | Admitting: Family Medicine

## 2014-10-11 LAB — PSA: PSA, FREE: 8.15

## 2014-10-17 ENCOUNTER — Encounter: Payer: Self-pay | Admitting: Nurse Practitioner

## 2014-10-17 NOTE — Progress Notes (Signed)
Electrophysiology Office Note Date: 10/18/2014  ID:  Devin Mercado, DOB 16-Jun-1951, MRN [id number]  PCP: Garret Reddish, MD Electrophysiologist: Lovena Le  CC: Pre-op clearance and pacemaker follow-up  Devin Mercado is a 64 y.o. male is seen today for Dr Lovena Le.  He presents today for add-on electrophysiology followup.  Since last being seen in our clinic, the patient reports doing reasonably well. He has recovered from recent hip replacement surgery but has since been found to have prostate cancer.  He has elected to undergo radical prostatectomy and presents today for pre-op clearance.  He denies chest pain, palpitations, dyspnea, PND, orthopnea, nausea, vomiting, dizziness, syncope, edema, weight gain, or early satiety.  He remains active without chest pain, shortness of breath, or functional limitations.    Device History: MDT dual chamber PPM implanted 2000 for complete heart block with generator change 2012   Past Medical History  Diagnosis Date  . HTN (hypertension)   . Hyperlipidemia   . Arthritis     OA AND PAIN LEFT HIP  . CHB (complete heart block)     a. MDT dual chamber pacemaker  . Prostate cancer    Past Surgical History  Procedure Laterality Date  . Pacemaker insertion  2000; 2012    MDT dual chamber pacemaker implanted 2000 with gen change 2012  . Adenoidectomy as a child    . Wisdom teeth extractions    . Total hip arthroplasty Left 07/03/2014    Procedure: LEFT TOTAL HIP ARTHROPLASTY ANTERIOR APPROACH;  Surgeon: [name], MD;  Location: [facility];  Service: Orthopedics;  Laterality: Left;  . Prostate biopsy  09/05/14    Current Outpatient Prescriptions  Medication Sig Dispense Refill  . aspirin 81 MG tablet Take 81 mg by mouth daily.    Marland Kitchen atorvastatin (LIPITOR) 20 MG tablet Take 1 tablet (20 mg total) by mouth daily. 90 tablet 3  . lisinopril (PRINIVIL,ZESTRIL) 10 MG tablet Take 1 tablet (10 mg total) by mouth daily. 90 tablet 3   No current  facility-administered medications for this visit.    Allergies:   Review of patient's allergies indicates no known allergies.   Social History: History   Social History  . Marital Status: Married    Spouse Name: N/A  . Number of Children: 4  . Years of Education: N/A   Occupational History  . attorney    Social History Main Topics  . Smoking status: Never Smoker   . Smokeless tobacco: Never Used  . Alcohol Use: 1.2 - 1.8 oz/week    2-3 Standard drinks or equivalent per week     Comment: 5 TO 7 DRINKS A WEEK  . Drug Use: No  . Sexual Activity: Not on file   Other Topics Concern  . Not on file   Social History Narrative   Married to Dr. [name] 1979. 4 kids (1 lost to auto accident-3 surviving), no grandkids.       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 Stroke Mother     multiple. 38.   . Heart disease Mother     hx heart valve problem  . Prostate cancer Father     65 of prostate cancer     Review of Systems: All other systems reviewed and are otherwise negative except as noted above.   Physical Exam: VS:  BP 140/92 mmHg  Pulse 73  Ht 6' (1.829 m)  Wt 225 lb 12.8 oz (102.422 kg)  BMI 30.62 kg/m2 , BMI Body mass index is 30.62 kg/(m^2).  GEN- The patient is well appearing, alert and oriented x 3 today.   HEENT: normocephalic, atraumatic; sclera clear, conjunctiva pink; hearing intact; oropharynx clear; neck supple, no JVP Lungs- Clear to ausculation bilaterally, normal work of breathing.  No wheezes, rales, rhonchi Heart- Regular rate and rhythm, no murmurs, rubs or gallops  GI- soft, non-tender, non-distended, bowel sounds present  Extremities- no clubbing, cyanosis, or edema; DP/PT pulses 2+ bilaterally MS- no significant deformity or atrophy Skin- warm and dry, no rash or lesion  Psych- euthymic mood, full affect Neuro- strength and sensation are intact  PPM  Interrogation- reviewed in detail today,  See PACEART report  EKG:  EKG is not ordered today - pt with 100% RV pacing  Recent Labs: 05/29/2014: ALT 19; TSH 3.07 07/04/2014: Hemoglobin 11.1*; Platelets 171 09/13/2014: BUN 35*; Creatinine 1.33; Potassium 3.9; Sodium 137   Wt Readings from Last 3 Encounters:  10/18/14 225 lb 12.8 oz (102.422 kg)  07/03/14 222 lb (100.699 kg)  06/07/14 225 lb 9.6 oz (102.331 kg)     Assessment and Plan:  1.  Pre-op cardiovascular clearance Patient is at acceptable cardiac risk for prostatectomy Ok to hold ASA prior to surgery Please call if we can be of assistance in peri-operative period  2.  Complete heart block Normal PPM function See Pace Art report No changes today  3.  HTN Stable No change required today  Plan discussed with Dr Lovena Le who was in to see the patient today.   Current medicines are reviewed at length with the patient today.   The patient does not have concerns regarding his medicines.  The following changes were made today:  none  Labs/ tests ordered today include: none   Disposition:   Follow up with Dr Lovena Le as scheduled.   Signed, Chanetta Marshall, NP 10/18/2014 2:04 PM  [address] [place] [address] [phone] (office) [phone] (fax)

## 2014-10-18 ENCOUNTER — Ambulatory Visit (INDEPENDENT_AMBULATORY_CARE_PROVIDER_SITE_OTHER): Payer: Medicare Other | Admitting: Nurse Practitioner

## 2014-10-18 ENCOUNTER — Encounter: Payer: Self-pay | Admitting: Nurse Practitioner

## 2014-10-18 VITALS — BP 140/92 | HR 73 | Ht 72.0 in | Wt 225.8 lb

## 2014-10-18 DIAGNOSIS — Z01810 Encounter for preprocedural cardiovascular examination: Secondary | ICD-10-CM

## 2014-10-18 DIAGNOSIS — I10 Essential (primary) hypertension: Secondary | ICD-10-CM

## 2014-10-18 DIAGNOSIS — I442 Atrioventricular block, complete: Secondary | ICD-10-CM | POA: Diagnosis not present

## 2014-10-18 LAB — MDC_IDC_ENUM_SESS_TYPE_INCLINIC

## 2014-10-18 NOTE — Patient Instructions (Signed)
Follow up as scheduled. Your physician recommends that you continue on your current medications as directed. Please refer to the Current Medication list given to you today. 

## 2014-10-19 ENCOUNTER — Other Ambulatory Visit: Payer: Self-pay | Admitting: Internal Medicine

## 2014-10-19 ENCOUNTER — Other Ambulatory Visit: Payer: Self-pay | Admitting: *Deleted

## 2014-10-19 DIAGNOSIS — M6281 Muscle weakness (generalized): Secondary | ICD-10-CM | POA: Diagnosis not present

## 2014-10-19 DIAGNOSIS — C61 Malignant neoplasm of prostate: Secondary | ICD-10-CM | POA: Diagnosis not present

## 2014-10-19 DIAGNOSIS — R278 Other lack of coordination: Secondary | ICD-10-CM | POA: Diagnosis not present

## 2014-10-19 DIAGNOSIS — M62838 Other muscle spasm: Secondary | ICD-10-CM | POA: Diagnosis not present

## 2014-10-19 MED ORDER — LISINOPRIL 10 MG PO TABS: 10.0000 mg | ORAL_TABLET | Freq: Two times a day (BID) | ORAL | Status: DC

## 2014-10-19 NOTE — Telephone Encounter (Signed)
Discussed with Dr.[name] received fax from pharmacy for Lisinopril 10 mg need new Rx pt taking 2 tablets daily could not find in chart where increased. Dr. Yong Channel said okay to increase Lisinopril to 10 mg twice a day. New Rx sent to pharmacy.

## 2014-10-27 ENCOUNTER — Telehealth: Payer: Self-pay | Admitting: Internal Medicine

## 2014-10-27 ENCOUNTER — Encounter (HOSPITAL_COMMUNITY): Payer: Self-pay

## 2014-10-27 NOTE — Telephone Encounter (Signed)
Assessment and Plan:  1. Pre-op cardiovascular clearance Patient is at acceptable cardiac risk for prostatectomy Ok to hold ASA prior to surgery Please call if we can be of assistance in peri-operative period  Will fax note

## 2014-10-27 NOTE — Telephone Encounter (Signed)
New Message   Devin Mercado from [facility] following up on surgical clearance. Per [facility]- sent clearance on 3/23 and 4/12. Please call back and discuss.    Request for surgical clearance:  1. What type of surgery is being performed? Prostate removal   2. When is this surgery scheduled? 4/25  3. Are there any medications that need to be held prior to surgery and how long?   4. Name of physician performing surgery?  [name]  5. What is your office phone and fax number? [phone]  6.

## 2014-10-30 NOTE — Patient Instructions (Addendum)
Devin Mercado  10/30/2014   Your procedure is scheduled on:    11/06/2014    Report to [facility] Main  Entrance and follow signs to               [place] at     [place] AM.  Call this number if you have problems the morning of surgery [phone]   Remember:  Do not eat food or drink liquids :After Midnight.     Take these medicines the morning of surgery with A SIP OF WATER: none                                You may not have any metal on your body including hair pins and              piercings  Do not wear jewelry, , lotions, powders or perfumes., deodorant.                      Men may shave face and neck.   Do not bring valuables to the hospital. [place].  Contacts, dentures or bridgework may not be worn into surgery.  Leave suitcase in the car. After surgery it may be brought to your room.        Special Instructions: coughing and deep breathing exercises, leg exercises               Please read over the following fact sheets you were given: _____________________________________________________________________             [facility] - Preparing for Surgery Before surgery, you can play an important role.  Because skin is not sterile, your skin needs to be as free of germs as possible.  You can reduce the number of germs on your skin by washing with CHG (chlorahexidine gluconate) soap before surgery.  CHG is an antiseptic cleaner which kills germs and bonds with the skin to continue killing germs even after washing. Please DO NOT use if you have an allergy to CHG or antibacterial soaps.  If your skin becomes reddened/irritated stop using the CHG and inform your nurse when you arrive at Short Stay. Do not shave (including legs and underarms) for at least 48 hours prior to the first CHG shower.  You may shave your face/neck. Please follow these instructions carefully:  1.  Shower with CHG  Soap the night before surgery and the  morning of Surgery.  2.  If you choose to wash your hair, wash your hair first as usual with your  normal  shampoo.  3.  After you shampoo, rinse your hair and body thoroughly to remove the  shampoo.                           4.  Use CHG as you would any other liquid soap.  You can apply chg directly  to the skin and wash                       Gently with a scrungie or clean washcloth.  5.  Apply the CHG Soap to your body ONLY FROM THE NECK DOWN.  Do not use on face/ open                           Wound or open sores. Avoid contact with eyes, ears mouth and genitals (private parts).                       Wash face,  Genitals (private parts) with your normal soap.             6.  Wash thoroughly, paying special attention to the area where your surgery  will be performed.  7.  Thoroughly rinse your body with warm water from the neck down.  8.  DO NOT shower/wash with your normal soap after using and rinsing off  the CHG Soap.                9.  Pat yourself dry with a clean towel.            10.  Wear clean pajamas.            11.  Place clean sheets on your bed the night of your first shower and do not  sleep with pets. Day of Surgery : Do not apply any lotions/deodorants the morning of surgery.  Please wear clean clothes to the hospital/surgery center.  FAILURE TO FOLLOW THESE INSTRUCTIONS MAY RESULT IN THE CANCELLATION OF YOUR SURGERY PATIENT SIGNATURE_________________________________  NURSE SIGNATURE__________________________________  ________________________________________________________________________  WHAT IS A BLOOD TRANSFUSION? Blood Transfusion Information  A transfusion is the replacement of blood or some of its parts. Blood is made up of multiple cells which provide different functions.  Red blood cells carry oxygen and are used for blood loss replacement.  White blood cells fight against infection.  Platelets control  bleeding.  Plasma helps clot blood.  Other blood products are available for specialized needs, such as hemophilia or other clotting disorders. BEFORE THE TRANSFUSION  Who gives blood for transfusions?   Healthy volunteers who are fully evaluated to make sure their blood is safe. This is blood bank blood. Transfusion therapy is the safest it has ever been in the practice of medicine. Before blood is taken from a donor, a complete history is taken to make sure that person has no history of diseases nor engages in risky social behavior (examples are intravenous drug use or sexual activity with multiple partners). The donor's travel history is screened to minimize risk of transmitting infections, such as malaria. The donated blood is tested for signs of infectious diseases, such as HIV and hepatitis. The blood is then tested to be sure it is compatible with you in order to minimize the chance of a transfusion reaction. If you or a relative donates blood, this is often done in anticipation of surgery and is not appropriate for emergency situations. It takes many days to process the donated blood. RISKS AND COMPLICATIONS Although transfusion therapy is very safe and saves many lives, the main dangers of transfusion include:  1. Getting an infectious disease. 2. Developing a transfusion reaction. This is an allergic reaction to something in the blood you were given. Every precaution is taken to prevent this. The decision to have a blood transfusion has been considered carefully by your caregiver before blood is given. Blood is not given unless the benefits outweigh the risks. AFTER THE TRANSFUSION  Right after receiving a blood transfusion, you will usually feel much better and more energetic. This is especially  true if your red blood cells have gotten low (anemic). The transfusion raises the level of the red blood cells which carry oxygen, and this usually causes an energy increase.  The nurse  administering the transfusion will monitor you carefully for complications. HOME CARE INSTRUCTIONS  No special instructions are needed after a transfusion. You may find your energy is better. Speak with your caregiver about any limitations on activity for underlying diseases you may have. SEEK MEDICAL CARE IF:   Your condition is not improving after your transfusion.  You develop redness or irritation at the intravenous (IV) site. SEEK IMMEDIATE MEDICAL CARE IF:  Any of the following symptoms occur over the next 12 hours:  Shaking chills.  You have a temperature by mouth above 102 F (38.9 C), not controlled by medicine.  Chest, back, or muscle pain.  People around you feel you are not acting correctly or are confused.  Shortness of breath or difficulty breathing.  Dizziness and fainting.  You get a rash or develop hives.  You have a decrease in urine output.  Your urine turns a dark color or changes to pink, red, or brown. Any of the following symptoms occur over the next 10 days:  You have a temperature by mouth above 102 F (38.9 C), not controlled by medicine.  Shortness of breath.  Weakness after normal activity.  The white part of the eye turns yellow (jaundice).  You have a decrease in the amount of urine or are urinating less often.  Your urine turns a dark color or changes to pink, red, or brown. Document Released: 06/27/2000 Document Revised: 09/22/2011 Document Reviewed: 02/14/2008 ExitCare Patient Information 2014 [place].  _______________________________________________________________________  Incentive Spirometer  An incentive spirometer is a tool that can help keep your lungs clear and active. This tool measures how well you are filling your lungs with each breath. Taking long deep breaths may help reverse or decrease the chance of developing breathing (pulmonary) problems (especially infection) following:  A long period of time when you are  unable to move or be active. BEFORE THE PROCEDURE   If the spirometer includes an indicator to show your best effort, your nurse or respiratory therapist will set it to a desired goal.  If possible, sit up straight or lean slightly forward. Try not to slouch.  Hold the incentive spirometer in an upright position. INSTRUCTIONS FOR USE  3. Sit on the edge of your bed if possible, or sit up as far as you can in bed or on a chair. 4. Hold the incentive spirometer in an upright position. 5. Breathe out normally. 6. Place the mouthpiece in your mouth and seal your lips tightly around it. 7. Breathe in slowly and as deeply as possible, raising the piston or the ball toward the top of the column. 8. Hold your breath for 3-5 seconds or for as long as possible. Allow the piston or ball to fall to the bottom of the column. 9. Remove the mouthpiece from your mouth and breathe out normally. 10. Rest for a few seconds and repeat Steps 1 through 7 at least 10 times every 1-2 hours when you are awake. Take your time and take a few normal breaths between deep breaths. 11. The spirometer may include an indicator to show your best effort. Use the indicator as a goal to work toward during each repetition. 12. After each set of 10 deep breaths, practice coughing to be sure your lungs are clear. If you have  an incision (the cut made at the time of surgery), support your incision when coughing by placing a pillow or rolled up towels firmly against it. Once you are able to get out of bed, walk around indoors and cough well. You may stop using the incentive spirometer when instructed by your caregiver.  RISKS AND COMPLICATIONS  Take your time so you do not get dizzy or light-headed.  If you are in pain, you may need to take or ask for pain medication before doing incentive spirometry. It is harder to take a deep breath if you are having pain. AFTER USE  Rest and breathe slowly and easily.  It can be helpful to  keep track of a log of your progress. Your caregiver can provide you with a simple table to help with this. If you are using the spirometer at home, follow these instructions: [place] IF:   You are having difficultly using the spirometer.  You have trouble using the spirometer as often as instructed.  Your pain medication is not giving enough relief while using the spirometer.  You develop fever of 100.5 F (38.1 C) or higher. SEEK IMMEDIATE MEDICAL CARE IF:   You cough up bloody sputum that had not been present before.  You develop fever of 102 F (38.9 C) or greater.  You develop worsening pain at or near the incision site. MAKE SURE YOU:   Understand these instructions.  Will watch your condition.  Will get help right away if you are not doing well or get worse. Document Released: 11/10/2006 Document Revised: 09/22/2011 Document Reviewed: 01/11/2007 [facility] Patient Information 2014 [place], [place].   ________________________________________________________________________

## 2014-10-31 ENCOUNTER — Telehealth: Payer: Self-pay | Admitting: Internal Medicine

## 2014-10-31 ENCOUNTER — Encounter (HOSPITAL_COMMUNITY): Payer: Self-pay

## 2014-10-31 ENCOUNTER — Encounter (HOSPITAL_COMMUNITY)
Admission: RE | Admit: 2014-10-31 | Discharge: 2014-10-31 | Disposition: A | Discharge status: Home / Self Care | Payer: Medicare Other | Source: Ambulatory Visit | Attending: Urology | Admitting: Urology

## 2014-10-31 DIAGNOSIS — Z79899 Other long term (current) drug therapy: Secondary | ICD-10-CM | POA: Diagnosis not present

## 2014-10-31 DIAGNOSIS — Z0183 Encounter for blood typing: Secondary | ICD-10-CM | POA: Insufficient documentation

## 2014-10-31 DIAGNOSIS — Z01812 Encounter for preprocedural laboratory examination: Secondary | ICD-10-CM | POA: Diagnosis not present

## 2014-10-31 DIAGNOSIS — E780 Pure hypercholesterolemia: Secondary | ICD-10-CM | POA: Diagnosis not present

## 2014-10-31 DIAGNOSIS — C61 Malignant neoplasm of prostate: Secondary | ICD-10-CM | POA: Diagnosis not present

## 2014-10-31 DIAGNOSIS — Z8042 Family history of malignant neoplasm of prostate: Secondary | ICD-10-CM | POA: Diagnosis not present

## 2014-10-31 DIAGNOSIS — Z7982 Long term (current) use of aspirin: Secondary | ICD-10-CM | POA: Insufficient documentation

## 2014-10-31 DIAGNOSIS — Z95810 Presence of automatic (implantable) cardiac defibrillator: Secondary | ICD-10-CM | POA: Insufficient documentation

## 2014-10-31 DIAGNOSIS — I10 Essential (primary) hypertension: Secondary | ICD-10-CM | POA: Diagnosis not present

## 2014-10-31 LAB — BASIC METABOLIC PANEL
Anion gap: 8 (ref 5–15)
BUN: 28 mg/dL — ABNORMAL HIGH (ref 6–23)
CALCIUM: 9.1 mg/dL (ref 8.4–10.5)
CO2: 24 mmol/L (ref 19–32)
CREATININE: 1.25 mg/dL (ref 0.50–1.35)
Chloride: 106 mmol/L (ref 96–112)
GFR, EST AFRICAN AMERICAN: 68 mL/min — AB (ref >90)
GFR, EST NON AFRICAN AMERICAN: 58 mL/min — AB (ref >90)
GLUCOSE: 123 mg/dL — AB (ref 70–99)
POTASSIUM: 4.6 mmol/L (ref 3.5–5.1)
Sodium: 138 mmol/L (ref 135–145)

## 2014-10-31 LAB — CBC
HCT: 43.5 % (ref 39.0–52.0)
HEMOGLOBIN: 14.5 g/dL (ref 13.0–17.0)
MCH: 28.4 pg (ref 26.0–34.0)
MCHC: 33.3 g/dL (ref 30.0–36.0)
MCV: 85.3 fL (ref 78.0–100.0)
PLATELETS: 203 10*3/uL (ref 150–400)
RBC: 5.1 MIL/uL (ref 4.22–5.81)
RDW: 13.4 % (ref 11.5–15.5)
WBC: 5.6 10*3/uL (ref 4.0–10.5)

## 2014-10-31 NOTE — Telephone Encounter (Signed)
New message     Request for surgical clearance:  What type of surgery is being performed? Prostate removal 1. When is this surgery scheduled? 11-06-14  2. Are there any medications that need to be held prior to surgery and how long?  3. Name of physician performing surgery? Dr [name]  4. What is your office phone and fax number? Fax [phone]  5. [facility] called on 10-27-14.  They have not received the clearance.  If you have already faxed the clearance, please refax it to [phone].  They did not get it

## 2014-10-31 NOTE — Progress Notes (Signed)
Called [employer] at [phone] and left message regarding patient's upcomoing surgery on 11/06/2014 for REP to call since is to be present perioperatively on surgery.  Operator will have REP to call. To confirm.

## 2014-10-31 NOTE — Progress Notes (Signed)
Tomi Bamberger , REP with [employer] called back and is aware of DR [name] wanting REP to be present perioperatively.  Aware surgery time at 0715am on 11/06/2014 and arrival time of patient at 0515am.

## 2014-10-31 NOTE — Progress Notes (Signed)
LOV with Cardiology - 10/18/14 EPIC along with last device check - preop clearance in note  EKG- 06/07/14 EPIC  CXR- 06/26/14 EPIC

## 2014-10-31 NOTE — Telephone Encounter (Signed)
I called [facility] and confirmed fax number below.  Telephone note from October 27, 2014 faxed to this number. I left message on Devin Mercado's voicemail that form has been faxed to her.

## 2014-10-31 NOTE — Progress Notes (Signed)
BMp results done 10/31/2014 faxed via EPIC to Dr [name].

## 2014-11-03 NOTE — H&P (Signed)
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 His DRE was normal.  He underwent a prostate needle biopsy by Dr. Diona Fanti on 09/05/14 due to his PSA elevation.  His biopsy confirmed Gleason 4+4=8 adenocarcinoma with 5 out of 12 biopsy cores (all left sided) positive for malignancy. He underwent staging studies including a bone scan on 2014-10-07 that was noted to be unremarkable except for a focus of uptake at the anterior right 4th rib.  Plain films were not suggestive of metastasis. A CT scan of the abdomen and pelvis on 09/14/14 was negative for evidence of metastatic disease.  His father died of metastatic prostate cancer at age 48, having undergone radiation seeds and developing recurrence and metastatic disease.  His PMH is significant for complete heart block managed by Dr. Cristopher Peru and s/p pacemaker placement. He is 100% paced.  He also has a history of dyslipidemia and hypertension.  TNM stage: cT1c Nx Mx PSA: 8.15 Gleason score: 4+4=8 Biopsy (09/05/14): 5/12 cores positive -- L lateral apex (20%, 4+3=7), L apex (10%, 4+3=7), L lateral mid (40%, 4+4=8), L lateral base (70% DI, 4+3=7), L base (20%, 4+3=7) Prostate volume: 37 cc  Nomogram OC disease: 21% EPE: 76% SVI: 19% LNI: 20% PFS (surgery): 42% at 5 years, 28% at 10 years  Urinary funciton: IPSS is 7. Erectile function: SHIM score is 19   Past Medical History  1. History of complete atrioventricular block (Z86.79)  2. History of hypercholesterolemia (Z86.39)  3. History of hypertension (Z86.79)  Surgical History  1. History of Cardioverter-defibrillator Pulse Generator Replacement  2. History of Pacemaker Placement  3. History of Total Hip Replacement  Current Meds  1. Aspirin 81 MG Oral Tablet;  Therapy: (Recorded:03Feb2016) to Recorded  2. Atorvastatin Calcium 20  MG Oral Tablet;  Therapy: (Recorded:03Feb2016) to Recorded  3. Levofloxacin 500 MG Oral Tablet; take one tab by mouth the day before procedure, take  one tab by mouth the day of procedure, take one tab by mouth the after the procedure;  Therapy: [id number] to (Last Rx:03Feb2016)  Requested for: 03Feb2016 Ordered  4. Lisinopril 10 MG Oral Tablet;  Therapy: (Recorded:03Feb2016) to Recorded  Allergies  1. No Known Drug Allergies  Family History  1. Family history of Death of parent : Mother, Father  2. Family history of prostate cancer (Z80.42) : Father  Social History   Alcohol use (Z78.9)   Caffeine use (F15.90)   Never a smoker   Three children  Review of Systems AU Complete-Male: Genitourinary, constitutional, skin, eye, otolaryngeal, hematologic/lymphatic, cardiovascular, pulmonary, endocrine, musculoskeletal, gastrointestinal, neurological and psychiatric system(s) were reviewed and pertinent findings if present are noted and are otherwise negative.    Physical Exam Constitutional: Well nourished and well developed . No acute distress.  ENT:. The ears and nose are normal in appearance.  Neck: The appearance of the neck is normal and no neck mass is present.  Pulmonary: No respiratory distress, normal respiratory rhythm and effort and clear bilateral breath sounds.  Cardiovascular: Heart rate and rhythm are normal . No peripheral edema.  Abdomen: The abdomen is soft and nontender. No masses are palpated. No CVA tenderness. No hernias are palpable. No hepatosplenomegaly noted.        Discussion/Summary  1. Prostate cancer:  He will undergo a RAL radical prostatectomy and  BPLND.

## 2014-11-06 ENCOUNTER — Encounter (HOSPITAL_COMMUNITY)
Admission: RE | Disposition: A | Discharge status: Home / Self Care | Payer: Self-pay | Source: Ambulatory Visit | Attending: Urology

## 2014-11-06 ENCOUNTER — Inpatient Hospital Stay (HOSPITAL_COMMUNITY): Payer: Medicare Other | Admitting: Certified Registered Nurse Anesthetist

## 2014-11-06 ENCOUNTER — Encounter (HOSPITAL_COMMUNITY): Payer: Self-pay | Admitting: *Deleted

## 2014-11-06 ENCOUNTER — Encounter: Payer: Self-pay | Admitting: Medical Oncology

## 2014-11-06 ENCOUNTER — Inpatient Hospital Stay (HOSPITAL_COMMUNITY)
Admission: RE | Admit: 2014-11-06 | Discharge: 2014-11-07 | DRG: 708 | Disposition: A | Discharge status: Home / Self Care | Payer: Medicare Other | Source: Ambulatory Visit | Attending: Urology | Admitting: Urology

## 2014-11-06 DIAGNOSIS — I10 Essential (primary) hypertension: Secondary | ICD-10-CM | POA: Diagnosis present

## 2014-11-06 DIAGNOSIS — C61 Malignant neoplasm of prostate: Principal | ICD-10-CM | POA: Diagnosis present

## 2014-11-06 DIAGNOSIS — Z01812 Encounter for preprocedural laboratory examination: Secondary | ICD-10-CM | POA: Diagnosis not present

## 2014-11-06 DIAGNOSIS — Z950 Presence of cardiac pacemaker: Secondary | ICD-10-CM | POA: Diagnosis not present

## 2014-11-06 DIAGNOSIS — Z96649 Presence of unspecified artificial hip joint: Secondary | ICD-10-CM | POA: Diagnosis present

## 2014-11-06 DIAGNOSIS — E785 Hyperlipidemia, unspecified: Secondary | ICD-10-CM | POA: Diagnosis present

## 2014-11-06 DIAGNOSIS — Z8042 Family history of malignant neoplasm of prostate: Secondary | ICD-10-CM

## 2014-11-06 DIAGNOSIS — M1990 Unspecified osteoarthritis, unspecified site: Secondary | ICD-10-CM | POA: Diagnosis not present

## 2014-11-06 DIAGNOSIS — Z7982 Long term (current) use of aspirin: Secondary | ICD-10-CM | POA: Diagnosis not present

## 2014-11-06 HISTORY — PX: LYMPHADENECTOMY: SHX5960

## 2014-11-06 HISTORY — PX: ROBOT ASSISTED LAPAROSCOPIC RADICAL PROSTATECTOMY: SHX5141

## 2014-11-06 LAB — HEMOGLOBIN AND HEMATOCRIT, BLOOD
HCT: 39.1 % (ref 39.0–52.0)
HEMOGLOBIN: 13.1 g/dL (ref 13.0–17.0)

## 2014-11-06 LAB — TYPE AND SCREEN
ABO/RH(D): A POS
ANTIBODY SCREEN: NEGATIVE

## 2014-11-06 SURGERY — ROBOTIC ASSISTED LAPAROSCOPIC RADICAL PROSTATECTOMY LEVEL 2
Anesthesia: General

## 2014-11-06 MED ORDER — STERILE WATER FOR IRRIGATION IR SOLN
Status: DC | PRN
Start: 2014-11-06 — End: 2014-11-06
  Administered 2014-11-06: 3000 mL

## 2014-11-06 MED ORDER — ROCURONIUM BROMIDE 100 MG/10ML IV SOLN
INTRAVENOUS | Status: DC | PRN
  Administered 2014-11-06: 5 mg via INTRAVENOUS
  Administered 2014-11-06: 10 mg via INTRAVENOUS
  Administered 2014-11-06 (×2): 20 mg via INTRAVENOUS
  Administered 2014-11-06: 50 mg via INTRAVENOUS

## 2014-11-06 MED ORDER — CIPROFLOXACIN IN D5W 400 MG/200ML IV SOLN
400.0000 mg | INTRAVENOUS | Status: AC
  Administered 2014-11-06: 400 mg via INTRAVENOUS

## 2014-11-06 MED ORDER — LACTATED RINGERS IV SOLN
INTRAVENOUS | Status: DC | PRN
  Administered 2014-11-06 (×2): via INTRAVENOUS

## 2014-11-06 MED ORDER — MIDAZOLAM HCL 5 MG/5ML IJ SOLN
INTRAMUSCULAR | Status: DC | PRN
  Administered 2014-11-06: 2 mg via INTRAVENOUS

## 2014-11-06 MED ORDER — CEFAZOLIN SODIUM-DEXTROSE 2-3 GM-% IV SOLR
INTRAVENOUS | Status: AC
  Filled 2014-11-06: qty 50

## 2014-11-06 MED ORDER — ACETAMINOPHEN 325 MG PO TABS
650.0000 mg | ORAL_TABLET | ORAL | Status: DC | PRN
  Administered 2014-11-06 – 2014-11-07 (×2): 650 mg via ORAL
  Filled 2014-11-06 (×2): qty 2

## 2014-11-06 MED ORDER — KETOROLAC TROMETHAMINE 15 MG/ML IJ SOLN
INTRAMUSCULAR | Status: AC
  Filled 2014-11-06: qty 1

## 2014-11-06 MED ORDER — HEPARIN SODIUM (PORCINE) 1000 UNIT/ML IJ SOLN
INTRAMUSCULAR | Status: AC
  Filled 2014-11-06: qty 1

## 2014-11-06 MED ORDER — NEOSTIGMINE METHYLSULFATE 10 MG/10ML IV SOLN
INTRAVENOUS | Status: AC
  Filled 2014-11-06: qty 1

## 2014-11-06 MED ORDER — MEPERIDINE HCL 50 MG/ML IJ SOLN: 6.2500 mg | INTRAMUSCULAR | Status: DC | PRN

## 2014-11-06 MED ORDER — BUPIVACAINE-EPINEPHRINE 0.25% -1:200000 IJ SOLN
INTRAMUSCULAR | Status: DC | PRN
  Administered 2014-11-06: 30 mL

## 2014-11-06 MED ORDER — GLYCOPYRROLATE 0.2 MG/ML IJ SOLN
INTRAMUSCULAR | Status: AC
  Filled 2014-11-06: qty 3

## 2014-11-06 MED ORDER — SODIUM CHLORIDE 0.9 % IR SOLN
Status: DC | PRN
  Administered 2014-11-06: 1000 mL via INTRAVESICAL

## 2014-11-06 MED ORDER — DIPHENHYDRAMINE HCL 50 MG/ML IJ SOLN: 12.5000 mg | Freq: Four times a day (QID) | INTRAMUSCULAR | Status: DC | PRN

## 2014-11-06 MED ORDER — GLYCOPYRROLATE 0.2 MG/ML IJ SOLN
INTRAMUSCULAR | Status: DC | PRN
  Administered 2014-11-06 (×2): 0.1 mg via INTRAVENOUS

## 2014-11-06 MED ORDER — CEFAZOLIN SODIUM-DEXTROSE 2-3 GM-% IV SOLR
2.0000 g | INTRAVENOUS | Status: AC
  Administered 2014-11-06: 2 g via INTRAVENOUS

## 2014-11-06 MED ORDER — ATORVASTATIN CALCIUM 20 MG PO TABS
20.0000 mg | ORAL_TABLET | Freq: Every day | ORAL | Status: DC
  Administered 2014-11-06 – 2014-11-07 (×2): 20 mg via ORAL
  Filled 2014-11-06 (×2): qty 1

## 2014-11-06 MED ORDER — KETOROLAC TROMETHAMINE 15 MG/ML IJ SOLN
15.0000 mg | Freq: Four times a day (QID) | INTRAMUSCULAR | Status: DC
  Administered 2014-11-06 – 2014-11-07 (×4): 15 mg via INTRAVENOUS
  Filled 2014-11-06 (×7): qty 1

## 2014-11-06 MED ORDER — ROCURONIUM BROMIDE 100 MG/10ML IV SOLN
INTRAVENOUS | Status: AC
  Filled 2014-11-06: qty 1

## 2014-11-06 MED ORDER — NEOSTIGMINE METHYLSULFATE 10 MG/10ML IV SOLN
INTRAVENOUS | Status: DC | PRN
  Administered 2014-11-06 (×2): 1 mg via INTRAVENOUS

## 2014-11-06 MED ORDER — LACTATED RINGERS IV SOLN: INTRAVENOUS | Status: DC

## 2014-11-06 MED ORDER — METOCLOPRAMIDE HCL 5 MG/ML IJ SOLN
INTRAMUSCULAR | Status: DC | PRN
  Administered 2014-11-06: 10 mg via INTRAVENOUS

## 2014-11-06 MED ORDER — DOCUSATE SODIUM 100 MG PO CAPS
100.0000 mg | ORAL_CAPSULE | Freq: Two times a day (BID) | ORAL | Status: DC
Start: 2014-11-06 — End: 2014-11-07
  Administered 2014-11-06 – 2014-11-07 (×3): 100 mg via ORAL
  Filled 2014-11-06 (×3): qty 1

## 2014-11-06 MED ORDER — CIPROFLOXACIN IN D5W 400 MG/200ML IV SOLN
INTRAVENOUS | Status: AC
  Filled 2014-11-06: qty 200

## 2014-11-06 MED ORDER — PROPOFOL 10 MG/ML IV BOLUS
INTRAVENOUS | Status: AC
  Filled 2014-11-06: qty 20

## 2014-11-06 MED ORDER — ONDANSETRON HCL 4 MG/2ML IJ SOLN
INTRAMUSCULAR | Status: DC | PRN
  Administered 2014-11-06: 4 mg via INTRAVENOUS

## 2014-11-06 MED ORDER — PHENYLEPHRINE HCL 10 MG/ML IJ SOLN
INTRAMUSCULAR | Status: DC | PRN
Start: 2014-11-06 — End: 2014-11-06

## 2014-11-06 MED ORDER — BUPIVACAINE-EPINEPHRINE (PF) 0.25% -1:200000 IJ SOLN
INTRAMUSCULAR | Status: AC
  Filled 2014-11-06: qty 30

## 2014-11-06 MED ORDER — FENTANYL CITRATE (PF) 100 MCG/2ML IJ SOLN
25.0000 ug | INTRAMUSCULAR | Status: DC | PRN
  Administered 2014-11-06 (×2): 50 ug via INTRAVENOUS

## 2014-11-06 MED ORDER — FENTANYL CITRATE (PF) 100 MCG/2ML IJ SOLN
INTRAMUSCULAR | Status: AC
  Filled 2014-11-06: qty 2

## 2014-11-06 MED ORDER — KCL IN DEXTROSE-NACL 20-5-0.45 MEQ/L-%-% IV SOLN
INTRAVENOUS | Status: DC
  Administered 2014-11-06 – 2014-11-07 (×2): via INTRAVENOUS
  Filled 2014-11-06 (×4): qty 1000

## 2014-11-06 MED ORDER — ONDANSETRON HCL 4 MG/2ML IJ SOLN
INTRAMUSCULAR | Status: AC
  Filled 2014-11-06: qty 2

## 2014-11-06 MED ORDER — FENTANYL CITRATE (PF) 100 MCG/2ML IJ SOLN
INTRAMUSCULAR | Status: DC | PRN
  Administered 2014-11-06 (×4): 25 ug via INTRAVENOUS
  Administered 2014-11-06: 50 ug via INTRAVENOUS

## 2014-11-06 MED ORDER — PROMETHAZINE HCL 25 MG/ML IJ SOLN: 6.2500 mg | INTRAMUSCULAR | Status: DC | PRN

## 2014-11-06 MED ORDER — METOCLOPRAMIDE HCL 5 MG/ML IJ SOLN
INTRAMUSCULAR | Status: AC
  Filled 2014-11-06: qty 2

## 2014-11-06 MED ORDER — HYDROMORPHONE HCL 2 MG/ML IJ SOLN
INTRAMUSCULAR | Status: AC
  Filled 2014-11-06: qty 1

## 2014-11-06 MED ORDER — SODIUM CHLORIDE 0.9 % IV BOLUS (SEPSIS)
1000.0000 mL | Freq: Once | INTRAVENOUS | Status: AC
  Administered 2014-11-06: 1000 mL via INTRAVENOUS

## 2014-11-06 MED ORDER — HYDROCODONE-ACETAMINOPHEN 5-325 MG PO TABS: 1.0000 | ORAL_TABLET | Freq: Four times a day (QID) | ORAL | Status: DC | PRN

## 2014-11-06 MED ORDER — MORPHINE SULFATE 10 MG/ML IJ SOLN: 2.0000 mg | INTRAMUSCULAR | Status: DC | PRN

## 2014-11-06 MED ORDER — CEFAZOLIN SODIUM 1-5 GM-% IV SOLN
1.0000 g | Freq: Three times a day (TID) | INTRAVENOUS | Status: AC
Start: 2014-11-06 — End: 2014-11-07
  Administered 2014-11-06 (×2): 1 g via INTRAVENOUS
  Filled 2014-11-06 (×2): qty 50

## 2014-11-06 MED ORDER — HYDROMORPHONE HCL 1 MG/ML IJ SOLN
INTRAMUSCULAR | Status: DC | PRN
  Administered 2014-11-06 (×2): 1 mg via INTRAVENOUS

## 2014-11-06 MED ORDER — LIDOCAINE HCL (CARDIAC) 20 MG/ML IV SOLN
INTRAVENOUS | Status: DC | PRN
  Administered 2014-11-06: 50 mg via INTRAVENOUS

## 2014-11-06 MED ORDER — FENTANYL CITRATE (PF) 250 MCG/5ML IJ SOLN
INTRAMUSCULAR | Status: AC
  Filled 2014-11-06: qty 5

## 2014-11-06 MED ORDER — EPHEDRINE SULFATE 50 MG/ML IJ SOLN
INTRAMUSCULAR | Status: AC
  Filled 2014-11-06: qty 1

## 2014-11-06 MED ORDER — PROPOFOL 10 MG/ML IV BOLUS
INTRAVENOUS | Status: DC | PRN
  Administered 2014-11-06: 200 mg via INTRAVENOUS

## 2014-11-06 MED ORDER — MIDAZOLAM HCL 2 MG/2ML IJ SOLN
INTRAMUSCULAR | Status: AC
  Filled 2014-11-06: qty 2

## 2014-11-06 MED ORDER — LIDOCAINE HCL (CARDIAC) 20 MG/ML IV SOLN
INTRAVENOUS | Status: AC
  Filled 2014-11-06: qty 5

## 2014-11-06 MED ORDER — EPHEDRINE SULFATE 50 MG/ML IJ SOLN
INTRAMUSCULAR | Status: DC | PRN
  Administered 2014-11-06: 10 mg via INTRAVENOUS
  Administered 2014-11-06 (×5): 5 mg via INTRAVENOUS
  Administered 2014-11-06: 10 mg via INTRAVENOUS
  Administered 2014-11-06: 5 mg via INTRAVENOUS

## 2014-11-06 MED ORDER — DIPHENHYDRAMINE HCL 12.5 MG/5ML PO ELIX: 12.5000 mg | ORAL_SOLUTION | Freq: Four times a day (QID) | ORAL | Status: DC | PRN

## 2014-11-06 MED ORDER — CIPROFLOXACIN HCL 500 MG PO TABS: 500.0000 mg | ORAL_TABLET | Freq: Two times a day (BID) | ORAL | Status: DC

## 2014-11-06 MED ORDER — LACTATED RINGERS IV SOLN
INTRAVENOUS | Status: DC | PRN
  Administered 2014-11-06: 08:00:00

## 2014-11-06 SURGICAL SUPPLY — 54 items
CABLE HIGH FREQUENCY MONO STRZ (ELECTRODE) ×4 IMPLANT
CATH FOLEY 2WAY SLVR 18FR 30CC (CATHETERS) ×4 IMPLANT
CATH ROBINSON RED A/P 16FR (CATHETERS) ×4 IMPLANT
CATH ROBINSON RED A/P 8FR (CATHETERS) ×4 IMPLANT
CATH TIEMANN FOLEY 18FR 5CC (CATHETERS) ×4 IMPLANT
CHLORAPREP W/TINT 26ML (MISCELLANEOUS) ×4 IMPLANT
CLIP LIGATING HEM O LOK PURPLE (MISCELLANEOUS) ×8 IMPLANT
CLOTH BEACON ORANGE TIMEOUT ST (SAFETY) ×4 IMPLANT
COVER SURGICAL LIGHT HANDLE (MISCELLANEOUS) ×4 IMPLANT
COVER TIP SHEARS 8 DVNC (MISCELLANEOUS) ×2 IMPLANT
COVER TIP SHEARS 8MM DA VINCI (MISCELLANEOUS) ×2
CUTTER ECHEON FLEX ENDO 45 340 (ENDOMECHANICALS) ×4 IMPLANT
DECANTER SPIKE VIAL GLASS SM (MISCELLANEOUS) ×4 IMPLANT
DERMABOND ADVANCED (GAUZE/BANDAGES/DRESSINGS) ×2
DERMABOND ADVANCED .7 DNX12 (GAUZE/BANDAGES/DRESSINGS) ×2 IMPLANT
DRAPE SURG IRRIG POUCH 19X23 (DRAPES) ×4 IMPLANT
DRSG TEGADERM 4X4.75 (GAUZE/BANDAGES/DRESSINGS) ×4 IMPLANT
DRSG TEGADERM 6X8 (GAUZE/BANDAGES/DRESSINGS) ×8 IMPLANT
ELECT REM PT RETURN 9FT ADLT (ELECTROSURGICAL) ×4
ELECTRODE REM PT RTRN 9FT ADLT (ELECTROSURGICAL) ×2 IMPLANT
GLOVE BIO SURGEON STRL SZ 6.5 (GLOVE) ×3 IMPLANT
GLOVE BIO SURGEONS STRL SZ 6.5 (GLOVE) ×1
GLOVE BIOGEL M STRL SZ7.5 (GLOVE) ×8 IMPLANT
GOWN STRL REUS W/TWL LRG LVL3 (GOWN DISPOSABLE) ×12 IMPLANT
HOLDER FOLEY CATH W/STRAP (MISCELLANEOUS) ×4 IMPLANT
IV LACTATED RINGERS 1000ML (IV SOLUTION) ×4 IMPLANT
KIT ACCESSORY DA VINCI DISP (KITS) ×2
KIT ACCESSORY DVNC DISP (KITS) ×2 IMPLANT
LIQUID BAND (GAUZE/BANDAGES/DRESSINGS) ×4 IMPLANT
MANIFOLD NEPTUNE II (INSTRUMENTS) ×4 IMPLANT
NDL SAFETY ECLIPSE 18X1.5 (NEEDLE) ×2 IMPLANT
NEEDLE HYPO 18GX1.5 SHARP (NEEDLE) ×2
PACK ROBOT UROLOGY CUSTOM (CUSTOM PROCEDURE TRAY) ×4 IMPLANT
RELOAD GREEN ECHELON 45 (STAPLE) ×4 IMPLANT
SET TUBE IRRIG SUCTION NO TIP (IRRIGATION / IRRIGATOR) ×4 IMPLANT
SHEET LAVH (DRAPES) ×4 IMPLANT
SOLUTION ELECTROLUBE (MISCELLANEOUS) ×4 IMPLANT
SUT ETHILON 3 0 PS 1 (SUTURE) ×4 IMPLANT
SUT MNCRL 3 0 RB1 (SUTURE) ×2 IMPLANT
SUT MNCRL 3 0 VIOLET RB1 (SUTURE) ×2 IMPLANT
SUT MNCRL AB 4-0 PS2 18 (SUTURE) ×8 IMPLANT
SUT MONOCRYL 3 0 RB1 (SUTURE) ×4
SUT VIC AB 0 CT1 27 (SUTURE) ×2
SUT VIC AB 0 CT1 27XBRD ANTBC (SUTURE) ×2 IMPLANT
SUT VIC AB 0 UR5 27 (SUTURE) ×4 IMPLANT
SUT VIC AB 2-0 SH 27 (SUTURE) ×2
SUT VIC AB 2-0 SH 27X BRD (SUTURE) ×2 IMPLANT
SUT VIC AB 3-0 SH 27 (SUTURE) ×2
SUT VIC AB 3-0 SH 27X BRD (SUTURE) ×2 IMPLANT
SUT VICRYL 0 UR6 27IN ABS (SUTURE) ×8 IMPLANT
SYR 27GX1/2 1ML LL SAFETY (SYRINGE) ×4 IMPLANT
TOWEL OR 17X26 10 PK STRL BLUE (TOWEL DISPOSABLE) ×4 IMPLANT
TOWEL OR NON WOVEN STRL DISP B (DISPOSABLE) ×4 IMPLANT
WATER STERILE IRR 1500ML POUR (IV SOLUTION) ×8 IMPLANT

## 2014-11-06 NOTE — Anesthesia Postprocedure Evaluation (Signed)
  Anesthesia Post-op Note  Patient: Devin Mercado  Procedure(s) Performed: Procedure(s) (LRB): ROBOTIC ASSISTED LAPAROSCOPIC RADICAL PROSTATECTOMY LEVEL 2 (N/A) PELVIC LYMPHADENECTOMY (Bilateral)  Patient Location: PACU  Anesthesia Type: General  Level of Consciousness: awake and alert   Airway and Oxygen Therapy: Patient Spontanous Breathing  Post-op Pain: mild  Post-op Assessment: Post-op Vital signs reviewed, Patient's Cardiovascular Status Stable, Respiratory Function Stable, Patent Airway and No signs of Nausea or vomiting  Last Vitals:  Filed Vitals:   11/06/14 1241  BP: 158/95  Pulse: 94  Temp: 36.6 C  Resp: 14    Post-op Vital Signs: stable   Complications: No apparent anesthesia complications

## 2014-11-06 NOTE — Progress Notes (Signed)
Post-op note  Subjective: The patient is doing well.  No complaints. Denies N/V  Objective: Vital signs in last 24 hours: Temp:  [97.5 F (36.4 C)-97.8 F (36.6 C)] 97.8 F (36.6 C) (04/25 1241) Pulse Rate:  [73-94] 94 (04/25 1241) Resp:  [13-18] 14 (04/25 1241) BP: (142-165)/(75-95) 158/95 mmHg (04/25 1241) SpO2:  [95 %-100 %] 95 % (04/25 1241) Weight:  [97.523 kg (215 lb)] 97.523 kg (215 lb) (04/25 0607)  Intake/Output from previous day:   Intake/Output this shift: Total I/O In: 2233 [I.V.:2275; IV Piggyback:1000] Out: 190 [Urine:100; Drains:40; Blood:50]  Physical Exam:  General: Alert and oriented. Abdomen: Soft, Nondistended. Incisions: Clean and dry. Urine: red  Lab Results:  Recent Labs  11/06/14 1129  HGB 13.1  HCT 39.1    Assessment/Plan: POD#0   1) Continue to monitor  2) DVT prophy, clears, IS, amb, pain control   LOS: 0 days   Devin Mercado 11/06/2014, 3:20 PM

## 2014-11-06 NOTE — Anesthesia Procedure Notes (Signed)
Procedure Name: Intubation Date/Time: 11/06/2014 7:29 AM Performed by: Deliah Boston Pre-anesthesia Checklist: Patient identified, Emergency Drugs available, Suction available and Patient being monitored Patient Re-evaluated:Patient Re-evaluated prior to inductionOxygen Delivery Method: Circle System Utilized Preoxygenation: Pre-oxygenation with 100% oxygen Intubation Type: IV induction Ventilation: Mask ventilation without difficulty Laryngoscope Size: Mac and 4 Grade View: Grade II Tube type: Oral Tube size: 8.0 mm Number of attempts: 1 Airway Equipment and Method: Oral airway Placement Confirmation: ETT inserted through vocal cords under direct vision,  positive ETCO2 and breath sounds checked- equal and bilateral Secured at: 22 cm Tube secured with: Tape Dental Injury: Teeth and Oropharynx as per pre-operative assessment

## 2014-11-06 NOTE — Discharge Instructions (Signed)

## 2014-11-06 NOTE — Transfer of Care (Signed)
Immediate Anesthesia Transfer of Care Note  Patient: Devin Mercado. [name]  Procedure(s) Performed: Procedure(s): ROBOTIC ASSISTED LAPAROSCOPIC RADICAL PROSTATECTOMY LEVEL 2 (N/A) PELVIC LYMPHADENECTOMY (Bilateral)  Patient Location: PACU  Anesthesia Type:General  Level of Consciousness: Patient easily awoken, sedated, comfortable, cooperative, following commands, responds to stimulation.   Airway & Oxygen Therapy: Patient spontaneously breathing, ventilating well, oxygen via simple oxygen mask.  Post-op Assessment: Report given to PACU RN, vital signs reviewed and stable, moving all extremities.   Post vital signs: Reviewed and stable.  Complications: No apparent anesthesia complications

## 2014-11-06 NOTE — Op Note (Signed)
Preoperative diagnosis: Clinically localized adenocarcinoma of the prostate (clinical stage T1c N0 M0)  Postoperative diagnosis: Clinically localized adenocarcinoma of the prostate (clinical stage T1c N0 M0)  Procedure:  1. Robotic assisted laparoscopic radical prostatectomy (bilateral nerve sparing - partial on left) 2. Bilateral robotic assisted laparoscopic pelvic lymphadenectomy  Surgeon: Pryor Curia. M.D.  Assistant: Debbrah Alar, PA-C   Resident: Dr. Amaryllis Dyke  Anesthesia: General  Complications: None  EBL: 50 mL  IVF:  1000 mL crystalloid  Specimens: 1. Prostate and seminal vesicles 2. Right pelvic lymph nodes 3. Left pelvic lymph nodes  Disposition of specimens: Pathology  Drains: 1. 20 Fr coude catheter 2. # 19 Blake pelvic drain  Indication: Devin Mercado is a 64 y.o. year old patient with clinically localized prostate cancer.  After a thorough review of the management options for treatment of prostate cancer, he elected to proceed with surgical therapy and the above procedure(s).  We have discussed the potential benefits and risks of the procedure, side effects of the proposed treatment, the likelihood of the patient achieving the goals of the procedure, and any potential problems that might occur during the procedure or recuperation. Informed consent has been obtained.  Description of procedure:  The patient was taken to the operating room and a general anesthetic was administered. He was given preoperative antibiotics, placed in the dorsal lithotomy position, and prepped and draped in the usual sterile fashion. Next a preoperative timeout was performed. A urethral catheter was placed into the bladder and a site was selected near the umbilicus for placement of the camera port. This was placed using a standard open Hassan technique which allowed entry into the peritoneal cavity under direct vision and without difficulty. A 12 mm port was placed and a  pneumoperitoneum established. The camera was then used to inspect the abdomen and there was no evidence of any intra-abdominal injuries or other abnormalities. The remaining abdominal ports were then placed. 8 mm robotic ports were placed in the right lower quadrant, left lower quadrant, and far left lateral abdominal wall. A 5 mm port was placed in the right upper quadrant and a 12 mm port was placed in the right lateral abdominal wall for laparoscopic assistance. All ports were placed under direct vision without difficulty. The surgical cart was then docked.   Utilizing the cautery scissors, the bladder was reflected posteriorly allowing entry into the space of Retzius and identification of the endopelvic fascia and prostate. The periprostatic fat was then removed from the prostate allowing full exposure of the endopelvic fascia. The endopelvic fascia was then incised from the apex back to the base of the prostate bilaterally and the underlying levator muscle fibers were swept laterally off the prostate thereby isolating the dorsal venous complex. The dorsal vein was then stapled and divided with a 45 mm Flex Echelon stapler. Attention then turned to the bladder neck which was divided anteriorly thereby allowing entry into the bladder and exposure of the urethral catheter. The catheter balloon was deflated and the catheter was brought into the operative field and used to retract the prostate anteriorly. The posterior bladder neck was then examined and was divided allowing further dissection between the bladder and prostate posteriorly until the vasa deferentia and seminal vessels were identified. The vasa deferentia were isolated, divided, and lifted anteriorly. The seminal vesicles were dissected down to their tips with care to control the seminal vascular arterial blood supply. These structures were then lifted anteriorly and the space between Denonvillier's  fascia and the anterior rectum was developed with a  combination of sharp and blunt dissection. This isolated the vascular pedicles of the prostate.  The lateral prostatic fascia was then sharply incised allowing release of the neurovascular bundles bilaterally. The vascular pedicles of the prostate were then ligated with Weck clips between the prostate and neurovascular bundles and divided with sharp cold scissor dissection resulting in neurovascular bundle preservation. The neurovascular bundles were then separated off the apex of the prostate and urethra bilaterally. Due to the concern about high grade disease on the left side, a partial nerve sparing technique was employed on that side.  The urethra was then sharply transected allowing the prostate specimen to be disarticulated. The pelvis was copiously irrigated and hemostasis was ensured. There was no evidence for rectal injury.  Attention then turned to the right pelvic sidewall. The fibrofatty tissue between the external iliac vein, confluence of the iliac vessels, hypogastric artery, and Cooper's ligament was dissected free from the pelvic sidewall with care to preserve the obturator nerve. Weck clips were used for lymphostasis and hemostasis. An identical procedure was performed on the contralateral side and the lymphatic packets were removed for permanent pathologic analysis.  Attention then turned to the urethral anastomosis. A 2-0 Vicryl slip knot was placed between Denonvillier's fascia, the posterior bladder neck, and the posterior urethra to reapproximate these structures. A double-armed 3-0 Monocryl suture was then used to perform a 360 running tension-free anastomosis between the bladder neck and urethra. A new urethral catheter was then placed into the bladder and irrigated. There were no blood clots within the bladder and the anastomosis appeared to be watertight. A #19 Blake drain was then brought through the left lateral 8 mm port site and positioned appropriately within the pelvis. It  was secured to the skin with a nylon suture. The surgical cart was then undocked. The right lateral 12 mm port site was closed at the fascial level with a 0 Vicryl suture placed laparoscopically. All remaining ports were then removed under direct vision. The prostate specimen was removed intact within the Endopouch retrieval bag via the periumbilical camera port site. This fascial opening was closed with two running 0 Vicryl sutures. 0.25% Marcaine was then injected into all port sites and all incisions were reapproximated at the skin level with 4-0 Monocryl subcuticular sutures and Dermabond. The patient appeared to tolerate the procedure well and without complications. The patient was able to be extubated and transferred to the recovery unit in satisfactory condition.   Pryor Curia MD

## 2014-11-06 NOTE — [facility] Oncology Navigator Note (Signed)
I visited with [name] this afternoon.  He had robotic prostatectomy this morning by [name]. He states he is doing ok but still early post op. I asked him to call me with any questions or concerns. He voiced understanding.

## 2014-11-06 NOTE — Progress Notes (Signed)
Utilization review completed.  

## 2014-11-06 NOTE — Anesthesia Preprocedure Evaluation (Signed)
Anesthesia Evaluation  Patient identified by MRN, date of birth, ID band Patient awake    Reviewed: Allergy & Precautions, H&P , NPO status , Patient's Chart, lab work & pertinent test results  Airway Mallampati: II  TM Distance: >3 FB Neck ROM: Full    Dental no notable dental hx.    Pulmonary neg pulmonary ROS,  breath sounds clear to auscultation  Pulmonary exam normal       Cardiovascular hypertension, Pt. on medications + dysrhythmias + pacemaker Rhythm:Regular Rate:Normal  H/O heart block   Neuro/Psych negative neurological ROS  negative psych ROS   GI/Hepatic negative GI ROS, Neg liver ROS,   Endo/Other  negative endocrine ROS  Renal/GU negative Renal ROS  negative genitourinary   Musculoskeletal  (+) Arthritis -,   Abdominal (+) + obese,   Peds negative pediatric ROS (+)  Hematology negative hematology ROS (+)   Anesthesia Other Findings   Reproductive/Obstetrics negative OB ROS                             Anesthesia Physical  Anesthesia Plan  ASA: III  Anesthesia Plan: General   Post-op Pain Management:    Induction: Intravenous  Airway Management Planned: Oral ETT  Additional Equipment:   Intra-op Plan:   Post-operative Plan:   Informed Consent: I have reviewed the patients History and Physical, chart, labs and discussed the procedure including the risks, benefits and alternatives for the proposed anesthesia with the patient or authorized representative who has indicated his/her understanding and acceptance.   Dental advisory given  Plan Discussed with: CRNA  Anesthesia Plan Comments:         Anesthesia Quick Evaluation

## 2014-11-07 ENCOUNTER — Encounter (HOSPITAL_COMMUNITY): Payer: Self-pay | Admitting: Urology

## 2014-11-07 LAB — HEMOGLOBIN AND HEMATOCRIT, BLOOD
HCT: 36.6 % — ABNORMAL LOW (ref 39.0–52.0)
Hemoglobin: 12.3 g/dL — ABNORMAL LOW (ref 13.0–17.0)

## 2014-11-07 MED ORDER — BISACODYL 10 MG RE SUPP
10.0000 mg | Freq: Once | RECTAL | Status: DC
Start: 2014-11-07 — End: 2014-11-07
  Filled 2014-11-07: qty 1

## 2014-11-07 MED ORDER — HYDROCODONE-ACETAMINOPHEN 5-325 MG PO TABS: 1.0000 | ORAL_TABLET | Freq: Four times a day (QID) | ORAL | Status: DC | PRN

## 2014-11-07 NOTE — Progress Notes (Signed)
Patient refused his scheduled Dulcolax suppository this morning. Patient stated that he was told he could do it at home. This RN educated him on the importance of taking the medication. The patient verbalized his understanding of the importance of the medication and reported that he will buy it at the store and have his wife help him after discharge later today.

## 2014-11-07 NOTE — Progress Notes (Addendum)
1 Day Post-Op Subjective: The patient is doing well.  No nausea or vomiting. Tolerating clear liquids. Pain is adequately controlled. Ambulated x 1 around the halls.  Objective: Vital signs in last 24 hours: Temp:  [97.5 F (36.4 C)-97.8 F (36.6 C)] 97.8 F (36.6 C) (04/25 1241) Pulse Rate:  [74-94] 94 (04/25 1241) Resp:  [13-16] 14 (04/25 1241) BP: (142-160)/(75-95) 158/95 mmHg (04/25 1241) SpO2:  [95 %-100 %] 95 % (04/25 1241)  Intake/Output from previous day: 04/25 0701 - 04/26 0700 In: 6155 [P.O.:960; I.V.:4095; IV Piggyback:1100] Out: 1885 [Urine:1725; Drains:110; Blood:50] Intake/Output this shift: Total I/O In: 1282.5 [I.V.:1232.5; IV Piggyback:50] Out: 2094 [Urine:1325; Drains:20]   Physical Exam:  General: Alert and oriented. CV: RRR Lungs: Clear bilaterally. GI: Soft, Nondistended. Incisions: Clean, dry, and intact JP: minimal serosanguinous output Urine: Clear Extremities: Nontender, no erythema, no edema.  Lab Results:  Recent Labs  11/06/14 1129  HGB 13.1  HCT 39.1   Labs today pending   Assessment/Plan: POD# 1 s/p robotic prostatectomy. Drain output low last 12 hours (20 cc)  1) SL IVF 2) Ambulate, Incentive spirometry 3) Transition to oral pain medication 4) Dulcolax suppository 5) D/C pelvic drain 6) Plan for likely discharge later today    I have seen and examined patient and agree with the above assessment and plan.

## 2014-11-07 NOTE — Care Management Note (Signed)
    Page 1 of 1   11/07/2014     1:40:29 PM CARE MANAGEMENT NOTE 11/07/2014  Patient:  Devin Mercado, Devin Mercado.   Account Number:  [id number]  Date Initiated:  11/07/2014  Documentation initiated by:  Dessa Phi  Subjective/Objective Assessment:   64 y/o m admitted w/Prostate Ca.     Action/Plan:   From home.   Anticipated DC Date:  11/07/2014   Anticipated DC Plan:  [place]  CM consult      Choice offered to / List presented to:             Status of service:  Completed, signed off Medicare Important Message given?   (If response is "NO", the following Medicare IM given date fields will be blank) Date Medicare IM given:   Medicare IM given by:   Date Additional Medicare IM given:   Additional Medicare IM given by:    Discharge Disposition:  HOME/SELF CARE  Per UR Regulation:  Reviewed for med. necessity/level of care/duration of stay  If discussed at [place] of Stay Meetings, dates discussed:    Comments:  11/07/14 Dessa Phi RN BSN NCM [phone] d/c home no needs or orders.

## 2014-11-07 NOTE — Discharge Summary (Signed)
  Date of admission: 11/06/2014  Date of discharge: 11/07/2014  Admission diagnosis: Prostate Cancer  Discharge diagnosis: Prostate Cancer  History and Physical: For full details, please see admission history and physical. Briefly, Devin Mercado is a 64 y.o. gentleman with localized prostate cancer.  After discussing management/treatment options, he elected to proceed with surgical treatment.  Hospital Course: Devin Mercado was taken to the operating room on 11/06/2014 and underwent a robotic assisted laparoscopic radical prostatectomy. He tolerated this procedure well and without complications. Postoperatively, he was able to be transferred to a regular hospital room following recovery from anesthesia.  He was able to begin ambulating the night of surgery. He remained hemodynamically stable overnight.  He had excellent urine output with appropriately minimal output from his pelvic drain and his pelvic drain was removed on POD #1.  He was transitioned to oral pain medication, tolerated a clear liquid diet, and had met all discharge criteria and was able to be discharged home later on POD#1.  Laboratory values:  Recent Labs  11/06/14 1129 11/07/14 0523  HGB 13.1 12.3*  HCT 39.1 36.6*    Disposition: Home  Discharge instruction: He was instructed to be ambulatory but to refrain from heavy lifting, strenuous activity, or driving. He was instructed on urethral catheter care.  Discharge medications:     Medication List    STOP taking these medications        aspirin 81 MG tablet      TAKE these medications        atorvastatin 20 MG tablet  Commonly known as:  LIPITOR  Take 1 tablet (20 mg total) by mouth daily.     ciprofloxacin 500 MG tablet  Commonly known as:  CIPRO  Take 1 tablet (500 mg total) by mouth 2 (two) times daily. Start day prior to office visit for foley removal     HYDROcodone-acetaminophen 5-325 MG per tablet  Commonly known as:  NORCO  Take 1-2 tablets  by mouth every 6 (six) hours as needed.     lisinopril 10 MG tablet  Commonly known as:  PRINIVIL,ZESTRIL  Take 1 tablet (10 mg total) by mouth 2 (two) times daily.        Followup: He will followup in 1 week for catheter removal and to discuss his surgical pathology results.

## 2014-11-07 NOTE — Progress Notes (Signed)
D/c foley catheter teaching completed.  JP removed with 5 cc in bulb. Patient and wife did return demonstration with leg bag/foley bag exchange without difficulties.  Supplies given to patient. Patient ambulated in hall after teaching with c/o pain 3/10.  Acetaminophen given to patient. Patient sitting up in chair.  Will continue to monitor until d/c home.

## 2014-11-09 ENCOUNTER — Encounter: Payer: Self-pay | Admitting: Internal Medicine

## 2014-11-10 ENCOUNTER — Ambulatory Visit: Payer: Medicare Other | Admitting: Internal Medicine

## 2014-11-16 ENCOUNTER — Encounter: Payer: Self-pay | Admitting: Family Medicine

## 2014-11-27 DIAGNOSIS — M6281 Muscle weakness (generalized): Secondary | ICD-10-CM | POA: Diagnosis not present

## 2014-11-27 DIAGNOSIS — M62838 Other muscle spasm: Secondary | ICD-10-CM | POA: Diagnosis not present

## 2014-11-27 DIAGNOSIS — N3946 Mixed incontinence: Secondary | ICD-10-CM | POA: Diagnosis not present

## 2014-11-27 DIAGNOSIS — R278 Other lack of coordination: Secondary | ICD-10-CM | POA: Diagnosis not present

## 2014-12-13 DIAGNOSIS — N3946 Mixed incontinence: Secondary | ICD-10-CM | POA: Diagnosis not present

## 2014-12-13 DIAGNOSIS — M6281 Muscle weakness (generalized): Secondary | ICD-10-CM | POA: Diagnosis not present

## 2014-12-13 DIAGNOSIS — R278 Other lack of coordination: Secondary | ICD-10-CM | POA: Diagnosis not present

## 2014-12-13 DIAGNOSIS — M62838 Other muscle spasm: Secondary | ICD-10-CM | POA: Diagnosis not present

## 2014-12-22 DIAGNOSIS — C61 Malignant neoplasm of prostate: Secondary | ICD-10-CM | POA: Diagnosis not present

## 2014-12-22 LAB — PSA: PSA: 0.01

## 2014-12-25 DIAGNOSIS — M62838 Other muscle spasm: Secondary | ICD-10-CM | POA: Diagnosis not present

## 2014-12-25 DIAGNOSIS — R278 Other lack of coordination: Secondary | ICD-10-CM | POA: Diagnosis not present

## 2014-12-25 DIAGNOSIS — N3946 Mixed incontinence: Secondary | ICD-10-CM | POA: Diagnosis not present

## 2014-12-25 DIAGNOSIS — M6281 Muscle weakness (generalized): Secondary | ICD-10-CM | POA: Diagnosis not present

## 2015-01-03 ENCOUNTER — Encounter: Payer: Self-pay | Admitting: Family Medicine

## 2015-01-09 DIAGNOSIS — M6281 Muscle weakness (generalized): Secondary | ICD-10-CM | POA: Diagnosis not present

## 2015-01-09 DIAGNOSIS — R278 Other lack of coordination: Secondary | ICD-10-CM | POA: Diagnosis not present

## 2015-01-09 DIAGNOSIS — N3946 Mixed incontinence: Secondary | ICD-10-CM | POA: Diagnosis not present

## 2015-01-09 DIAGNOSIS — M62838 Other muscle spasm: Secondary | ICD-10-CM | POA: Diagnosis not present

## 2015-01-24 DIAGNOSIS — N3946 Mixed incontinence: Secondary | ICD-10-CM | POA: Diagnosis not present

## 2015-01-24 DIAGNOSIS — R278 Other lack of coordination: Secondary | ICD-10-CM | POA: Diagnosis not present

## 2015-01-24 DIAGNOSIS — M6281 Muscle weakness (generalized): Secondary | ICD-10-CM | POA: Diagnosis not present

## 2015-01-24 DIAGNOSIS — N529 Male erectile dysfunction, unspecified: Secondary | ICD-10-CM | POA: Diagnosis not present

## 2015-01-24 DIAGNOSIS — M62838 Other muscle spasm: Secondary | ICD-10-CM | POA: Diagnosis not present

## 2015-01-31 DIAGNOSIS — M6281 Muscle weakness (generalized): Secondary | ICD-10-CM | POA: Diagnosis not present

## 2015-01-31 DIAGNOSIS — M62838 Other muscle spasm: Secondary | ICD-10-CM | POA: Diagnosis not present

## 2015-01-31 DIAGNOSIS — N3946 Mixed incontinence: Secondary | ICD-10-CM | POA: Diagnosis not present

## 2015-01-31 DIAGNOSIS — R278 Other lack of coordination: Secondary | ICD-10-CM | POA: Diagnosis not present

## 2015-02-16 DIAGNOSIS — R278 Other lack of coordination: Secondary | ICD-10-CM | POA: Diagnosis not present

## 2015-02-16 DIAGNOSIS — M62838 Other muscle spasm: Secondary | ICD-10-CM | POA: Diagnosis not present

## 2015-02-16 DIAGNOSIS — M6281 Muscle weakness (generalized): Secondary | ICD-10-CM | POA: Diagnosis not present

## 2015-02-16 DIAGNOSIS — C61 Malignant neoplasm of prostate: Secondary | ICD-10-CM | POA: Diagnosis not present

## 2015-02-16 DIAGNOSIS — N3946 Mixed incontinence: Secondary | ICD-10-CM | POA: Diagnosis not present

## 2015-02-16 LAB — PSA: PSA: 0.04

## 2015-02-23 ENCOUNTER — Ambulatory Visit: Payer: Medicare Other | Admitting: Radiation Oncology

## 2015-03-05 ENCOUNTER — Telehealth: Payer: Self-pay | Admitting: Medical Oncology

## 2015-03-05 NOTE — Telephone Encounter (Signed)
Oncology Nurse Navigator Documentation  Oncology Nurse Navigator Flowsheets 03/05/2015  Navigator Encounter Type Telephone- I spoke with Devin Mercado to confirm his appointment for the Prostate [place].  Time Spent with Patient 15

## 2015-03-06 ENCOUNTER — Ambulatory Visit
Admission: RE | Admit: 2015-03-06 | Discharge: 2015-03-06 | Disposition: A | Discharge status: Home / Self Care | Payer: Medicare Other | Source: Ambulatory Visit | Attending: Radiation Oncology | Admitting: Radiation Oncology

## 2015-03-06 ENCOUNTER — Ambulatory Visit (HOSPITAL_BASED_OUTPATIENT_CLINIC_OR_DEPARTMENT_OTHER): Payer: Medicare Other | Admitting: Oncology

## 2015-03-06 ENCOUNTER — Encounter: Payer: Self-pay | Admitting: General Practice

## 2015-03-06 ENCOUNTER — Encounter: Payer: Self-pay | Admitting: Medical Oncology

## 2015-03-06 ENCOUNTER — Encounter: Payer: Self-pay | Admitting: Radiation Oncology

## 2015-03-06 VITALS — BP 146/76 | HR 81 | Temp 99.4°F | Ht 72.0 in | Wt 220.0 lb

## 2015-03-06 DIAGNOSIS — C61 Malignant neoplasm of prostate: Secondary | ICD-10-CM | POA: Insufficient documentation

## 2015-03-06 DIAGNOSIS — D4989 Neoplasm of unspecified behavior of other specified sites: Secondary | ICD-10-CM | POA: Diagnosis not present

## 2015-03-06 DIAGNOSIS — N529 Male erectile dysfunction, unspecified: Secondary | ICD-10-CM | POA: Diagnosis not present

## 2015-03-06 DIAGNOSIS — R32 Unspecified urinary incontinence: Secondary | ICD-10-CM

## 2015-03-06 DIAGNOSIS — N393 Stress incontinence (female) (male): Secondary | ICD-10-CM | POA: Diagnosis not present

## 2015-03-06 NOTE — Consult Note (Signed)
Chief Complaint  Prostate Cancer   Reason For Visit  Location of consult: [facility]   History of Present Illness  Devin Mercado is 64 years old with prostate cancer s/p a BNS RAL radical prostatectomy and BPLND on 11/06/14.  Diagnosis: pT3a N0 Mx, Gleason 4+5=9 adenocarcinoma with multiple focal positive margins Pretreatment PSA: 8.15 Pretreatment SHIM: 19  Interval history:  He follows up today approximately 4 months out from his radical prostatectomy.  He states that he is currently using 1 pad per day which is minimally wet.  This appears to be better than it was even a couple of weeks ago when he was last seen in therapy.  He otherwise denies any specific complaints.  He is here today to discuss further treatment of his locally advanced and very high-grade prostate cancer.     Past Medical History  1. History of complete atrioventricular block (Z86.79)  2. History of hypercholesterolemia (Z86.39)  3. History of hypertension (Z86.79)  Surgical History  1. History of Cardioverter-defibrillator Pulse Generator Replacement  2. History of Laparoscopy With Bilateral Total Pelvic Lymphadenectomy  3. History of Pacemaker Placement  4. History of Prostatect Retropubic Radical W/ Nerve Sparing Laparoscopic  5. History of Total Hip Replacement  Current Meds  1. Aspirin 81 MG TABS;  Therapy: (Recorded:03Feb2016) to Recorded  2. Atorvastatin Calcium 20 MG Oral Tablet;  Therapy: (Recorded:03Feb2016) to Recorded  3. Cialis 5 MG Oral Tablet; TAKE 1 TABLET DAILY AS DIRECTED;  Therapy: [id number] to (Evaluate:30Oct2016)  Requested for: [id number]; Last  Rx:03May2016 Ordered  4. Levofloxacin 500 MG Oral Tablet; take one tab by mouth the day before procedure, take  one tab by mouth the day of procedure, take one tab by mouth the after the procedure;  Therapy: [id number] to (Last Rx:03Feb2016)  Requested for: 03Feb2016 Ordered  5.  Lisinopril 10 MG Oral Tablet;  Therapy: (Recorded:03Feb2016) to Recorded  Allergies  1. No Known Drug Allergies  Family History  1. Family history of Death of parent : Mother, Father  2. Family history of prostate cancer (Z80.42) : Father  Social History   Alcohol use (Z78.9)   Never a smoker   Three children  Physical Exam Constitutional: Well nourished and well developed . No acute distress.    Results/Data Selected Results  PSA 05Aug2016 09:58AM Devin Mercado  SPECIMEN TYPE: BLOOD   Test Name Result Flag Reference  PSA 0.04 ng/mL  <=4.00  TEST METHODOLOGY: ECLIA PSA (ELECTROCHEMILUMINESCENCE IMMUNOASSAY)   PSA [id number] 08:47AM Devin Mercado  SPECIMEN TYPE: BLOOD   Test Name Result Flag Reference  PSA 0.01 ng/mL  <=4.00  TEST METHODOLOGY: ECLIA PSA (ELECTROCHEMILUMINESCENCE IMMUNOASSAY)   Assessment  1. Adenocarcinoma of prostate (C61)  2. Erectile dysfunction (N52.9)  3. Male stress incontinence (N39.3)  Discussion/Summary  1.  Locally advanced prostate cancer: His PSA remains quite low and is technically undetectable placed on traditional definitions for recurrent disease.  Considering his high risk features for local recurrence, I have strongly recommended that he proceed with adjuvant radiation therapy and he is agreeable with this approach.  He is scheduled to see Dr. Valere Dross today to discuss in further detail.  We did discuss his incontinence which is improving and I do think he likely would be ready to proceed with radiation therapy in the next 1-2 months.  I think that as long as he proceeds with radiation therapy prior to 6 months from surgery, he does not necessarily need to proceed with  androgen deprivation therapy.  Following his discussion with Dr. Valere Dross, we will coordinate his care and likely will have him plan to follow up proceed with radiation therapy followed by an appointment to check his PSA with me in approximately 4-6 months.  I will also be happy  to see him prior to starting radiation therapy that would be helpful to assess his urinary continence.  2.  Incontinence: He will continue his pelvic floor muscle training.  This is improving as hoped for.  3.  Erectile dysfunction: Continue penile rehabilitation with Cialis 5 mg daily.  Cc: Dr. Arloa Koh Dr. Garret Reddish     Signatures Electronically signed by : Devin Mercado, M.D.; Mar 06 2015  4:26PM EST

## 2015-03-06 NOTE — Progress Notes (Signed)
Devin Mercado here for further discussion regarding his prostate cancer as part of the prostate multidisciplinary clinic

## 2015-03-06 NOTE — Progress Notes (Signed)
CC: Dr. Garret Reddish, Dr. [name]  Follow-up note:  Diagnosis: Pathologic stage pT3a high-risk adenocarcinoma prostate  History: Devin Mercado is a pleasant 64 year old male who is seen today for review and discussion of possible postoperative radiation therapy in the management of his pathologic stage T3a high-risk adenocarcinoma prostate.  I first saw the patient in consultation at the prostate multidisciplinary clinic on 10/03/2014.  His PSA in October 2014 was 3.21. This rose to 8.15 by 07/20/2014. He was seen by Dr. Diona Fanti who performed ultrasound-guided biopsies on 09/05/2014. He was found to have Gleason 8 (4+4) involving 40% of one core from the left lateral mid gland. He also had Gleason 7 (4+3) involving 70% of one core from the left lateral base, 20% of one core from the left base, 20% of one core from left lateral apex and 10% of one core from the left apex.  His gland volume was 37 mL.  He was doing well from a GU and GI standpoint.  His staging workup included a negative CT scan of the abdomen/pelvis and negative bone scan.  He was potent with a SHIM score of 19.  Of note is that he did have hip replacement surgery in 2015 and he also has a cardiac pacemaker for cardiac arrhythmia with replacement of the pacemaker generator in 2012.  He elected for a nerve sparing robotic prostatectomy and this was performed by [name] on 11/06/2014.  He was found have Gleason 9 (4+5) involving both lobes and extending to the inked left side posterior margin at multiple foci.  Extraprostatic extension and LV I was seen.  There is no evidence for seminal vesicle involvement, and 7 lymph nodes were free of metastatic disease.  He currently has erectile dysfunction.  His PSA on 12/22/2014 was 0.01.  His PSA earlier this month on August 5 was minimally elevated at 0.04.  He still has some urinary leakage but this is slowly improving.  He now uses 1 pad a day.  The patient is interested in considering  initiation of radiation therapy in early October.  Physical examination: Alert and oriented. Filed Vitals:   03/06/15 1337  BP: 146/76  Pulse: 81  Temp:    Rectal examination not performed today.  Impression: T3a high-risk adenocarcinoma prostate.  Based on his broadly positive margin, I do recommend adjuvant/salvage radiation therapy.  The patient is interested in starting treatment in October, and [name] feels that this will probably be fine.  The patient like to go ahead and get scheduled so we will get him tentatively scheduled for treatment planning the first week in October.  As long he starts by October, I do not feel the need for androgen deprivation therapy.  We discussed the potential acute and late toxicities of radiation therapy, and consent is signed today.  Plan: As above.  30 minutes was spent face-to-face with the patient, primarily counseling patient and coordinating his care.

## 2015-03-06 NOTE — Progress Notes (Signed)
Hematology and Oncology Follow Up Visit  Devin Mercado [id number] [birth year] 64 y.o. 03/06/2015 2:04 PM Garret Reddish, MDHunter, Brayton Mars, MD   Principle Diagnosis: 64 year old with prostate cancer diagnosed with prostate cancer in 06/2014. He had a T1c, Gleason score 8 cancer with PSA 8.15.   Prior Therapy: S/P Radical prostatectomy in 10/2014. The pathology showed Gleason score 4 + 5 = 9, cancer involving both lobs extending to there inked left margins. EPE noted with LVI. 0/7 lymph nodes involved with cancer.   Current therapy: Observation and follow up.   Interim History: Devin Mercado presents today for a follow up at the [facility]. Since his last visit, he under went prostatectomy and tolerated it well. He is recovering well and he resumed activity of daily living. He still has incontinence issues as well as ED issues. He is back to work without decline. His PSA post operatively on 12/13/2014 was 0.01 and repeated on 02/16/2015 was up to 0.04. He report a growth on right shoulder. No pain or trauma noted.   He does not report any frequency urgency or hesitancy. He does not report any nocturia or dysuria. He does not report any skeletal complaints of arthralgias or myalgias. Does not report any fevers, chills, sweats or weight loss. Does not report any chest pain or palpitation. Does not report any nausea or vomiting or abdominal pain. Does not report any hematochezia or melena. Remaining review of systems unremarkable.   Medications: I have reviewed the patient's current medications.  Current Outpatient Prescriptions  Medication Sig Dispense Refill  . aspirin 81 MG tablet Take 81 mg by mouth daily.    Marland Kitchen atorvastatin (LIPITOR) 20 MG tablet Take 1 tablet (20 mg total) by mouth daily. 90 tablet 3  . lisinopril (PRINIVIL,ZESTRIL) 10 MG tablet Take 1 tablet (10 mg total) by mouth 2 (two) times daily. 180 tablet 1  . tadalafil (CIALIS) 5 MG tablet Take 5 mg by mouth daily as needed for  erectile dysfunction.     No current facility-administered medications for this visit.     Allergies: No Known Allergies  Past Medical History, Surgical history, Social history, and Family History were reviewed and updated.   Physical Exam: ECOG: 0 General appearance: alert and cooperative Head: Normocephalic, without obvious abnormality Neck: no adenopathy, no carotid bruit and no JVD  Lymph nodes: Cervical, supraclavicular, and axillary nodes normal. Heart:regular rate and rhythm, S1, S2 normal, no murmur, click, rub or gallop Lung:chest clear, no wheezing, rales, normal symmetric air entry Abdomin: soft, non-tender, without masses or organomegaly EXT:no erythema, induration, or nodules Soft, mobile growth on his right arm/shoulder. Non tender.  Lab Results: Lab Results  Component Value Date   WBC 5.6 10/31/2014   HGB 12.3* 11/07/2014   HCT 36.6* 11/07/2014   MCV 85.3 10/31/2014   PLT 203 10/31/2014     Chemistry      Component Value Date/Time   NA 138 10/31/2014 0910   K 4.6 10/31/2014 0910   CL 106 10/31/2014 0910   CO2 24 10/31/2014 0910   BUN 28* 10/31/2014 0910   CREATININE 1.25 10/31/2014 0910      Component Value Date/Time   CALCIUM 9.1 10/31/2014 0910   ALKPHOS 54 05/29/2014 1021   AST 19 05/29/2014 1021   ALT 19 05/29/2014 1021   BILITOT 1.0 05/29/2014 1021        Impression and Plan:  64 year old with the following issues.    1. Prostate  cancer. Diagnosed in 06/2014 with Gleason score 4 + 4 = 8 and PSA 8.15. He is S/P prostatectomy done in 10/2014 with the pathology showed 4 + 5 = 9 with extra prosthetic extension and no lymph node involvement. He does have involvement of the surgical margins. His follow-up PSA was 0.01 and in August 2016 up to 0.04. His case was discussed today in the prostate cancer multidisciplinary clinic and it was felt that he would benefit from salvage radiation therapy. It is reasonable to presume that he has residual  disease locally.  The rationale of adding hormonal therapy was discussed with the patient today. Based on recent evidence suggesting adding bicalutamide or Zoladex for a period of time to radiation therapy improved the salvage rate of radiation alone. Complications associated with hormone therapy was discussed with the patient including worsening erectile dysfunction, hot flashes among others.  The plan for this time is to allow to his urinary function to improve and potentially consider radiation therapy plus hormone therapy if he wishes to do that.  2. Growth on his right arm/shoulder: Unclear etiology but appears to be lipoma-like structure. It has gone the last 2 weeks and for that reason I have recommended evaluation by Gen. Surgery for excision or at least potential biopsy. I do not think this is related to his prostate cancer.  All his questions were answered today to his satisfaction.   Zola Button, MD 8/23/20162:04 PM

## 2015-03-06 NOTE — Progress Notes (Signed)
Oncology Nurse Navigator Documentation  Oncology Nurse Navigator Flowsheets 03/05/2015 03/06/2015  Navigator Encounter Type Telephone Clinic/MDC  Time Spent with Patient 15 30                                 Care Plan Summary  Name: Devin Mercado DOB: [birth year]   Your Medical Team:   Urologist -  Dr. [name], [facility]  Radiation Oncologist - Dr. Arloa Koh,  [facility]   Medical Oncologist - Dr. Zola Button, [place]  Recommendations: 1) Radiation  2) ADT with radiation if a delay due to bladder rehabilitation   * These recommendations are based on information available as of today's consult.      Recommendations may change depending on the results of further tests or exams.    Next Steps: 1) Dr. Charlton Amor office will schedule radiation treatments.   When appointments need to be scheduled, you will be contacted by [facility] and/or [facility].  Questions?  Please do not hesitate to call Cira Rue, RN, BSN, CRNI at [phone] any questions or concerns.  Shirlean Mylar is your Oncology Nurse Navigator and is available to assist you while you're receiving your medical care at [facility].

## 2015-03-07 ENCOUNTER — Encounter: Payer: Self-pay | Admitting: Family Medicine

## 2015-03-08 NOTE — Progress Notes (Signed)
[facility] Psychosocial Distress Screening Spiritual Care  Met with Mr Philbrook at [facility] to introduce [facility] team/resources, reviewing distress screen per protocol.  The patient scored a 3 on the Psychosocial Distress Thermometer which indicates mild distress. Also assessed for distress and other psychosocial needs.   ONCBCN DISTRESS SCREENING 10/03/2014  Screening Type Initial Screening  Distress experienced in past week (1-10) 8  Emotional problem type Nervousness/Anxiety  Physical Problem type Sleep/insomnia  Physician notified of physical symptoms Yes  Referral to clinical social work Yes  Referral to support programs   Other    Consistent with reports from other providers, Mr Po was quiet and did not engage in conversation or personal connection (eg, smiling) during encounter.  Provided pastoral presence and invitation to engage if desired, as well as introduction to support team and programming. Pt verbalized thanks.  Follow up needed: No.  Pt aware of Support Team availability and contact info for further support as desired.  Please also page as needs arise.  Thank you.  [place], [place] Pager [phone] Voicemail  [phone]

## 2015-03-16 DIAGNOSIS — M62838 Other muscle spasm: Secondary | ICD-10-CM | POA: Diagnosis not present

## 2015-03-16 DIAGNOSIS — M6281 Muscle weakness (generalized): Secondary | ICD-10-CM | POA: Diagnosis not present

## 2015-03-16 DIAGNOSIS — R278 Other lack of coordination: Secondary | ICD-10-CM | POA: Diagnosis not present

## 2015-03-16 DIAGNOSIS — N3946 Mixed incontinence: Secondary | ICD-10-CM | POA: Diagnosis not present

## 2015-04-16 ENCOUNTER — Ambulatory Visit
Admission: RE | Admit: 2015-04-16 | Discharge: 2015-04-16 | Disposition: A | Discharge status: Home / Self Care | Payer: Medicare Other | Source: Ambulatory Visit | Attending: Radiation Oncology | Admitting: Radiation Oncology

## 2015-04-16 ENCOUNTER — Encounter: Payer: Self-pay | Admitting: Medical Oncology

## 2015-04-16 DIAGNOSIS — Z510 Encounter for antineoplastic radiation therapy: Secondary | ICD-10-CM | POA: Insufficient documentation

## 2015-04-16 DIAGNOSIS — C61 Malignant neoplasm of prostate: Secondary | ICD-10-CM | POA: Insufficient documentation

## 2015-04-16 NOTE — Progress Notes (Signed)
Complex simulation/treatment planning note: The patient was taken to the CT simulator.  A Vac lock immobilization device was constructed.  A rubber tube was placed within the rectal vault.  He then underwent a urethrogram.  He was then scanned.  I chose an isocenter along the prostate bed.  The CT data set was sent to the MIM system where I contoured his high risk prostate bed CTV (CTV 66).  I also contoured his low-risk prostate bed CTV (CTV 54.45).  These were both expanded by 0.5 cm to create respective PTV's.  I also contoured his bladder, rectum, and lower rectosigmoid colon.  I am prescribing 6600 cGy in 33 sessions to his high-risk prostate PTV (PTV 66) and I'm prescribing 5445 cGy in 33 sessions to his low-risk prostate bed PTV (PTV 54.45).  He is now ready for IMRT simulation/treatment planning.

## 2015-04-18 ENCOUNTER — Encounter: Payer: Self-pay | Admitting: Radiation Oncology

## 2015-04-18 DIAGNOSIS — Z510 Encounter for antineoplastic radiation therapy: Secondary | ICD-10-CM | POA: Diagnosis not present

## 2015-04-18 DIAGNOSIS — C61 Malignant neoplasm of prostate: Secondary | ICD-10-CM | POA: Diagnosis not present

## 2015-04-18 NOTE — Progress Notes (Signed)
IMRT simulation/treatment planning note: The patient completed IMRT treatment planning in the management of his carcinoma the prostate.  IMRT was chosen to decrease the risk for both acute and late bladder and rectal toxicity compared to 3-D conformal or conventional radiation therapy particularly in view of his left hip replacement.  Dose volume histograms were obtained for the target structures including his prostate bed and avoidance structures including his bladder, rectum, and right femoral head.  We met our departmental guidelines.  I'm prescribing 6600 cGy in 33 sessions to his prostate bed utilizing dual ARC VMAT IMRT with 6 MV photons. 

## 2015-04-19 DIAGNOSIS — C61 Malignant neoplasm of prostate: Secondary | ICD-10-CM | POA: Diagnosis not present

## 2015-04-19 DIAGNOSIS — Z510 Encounter for antineoplastic radiation therapy: Secondary | ICD-10-CM | POA: Diagnosis not present

## 2015-04-25 ENCOUNTER — Ambulatory Visit
Admission: RE | Admit: 2015-04-25 | Discharge: 2015-04-25 | Disposition: A | Discharge status: Home / Self Care | Payer: Medicare Other | Source: Ambulatory Visit | Attending: Radiation Oncology | Admitting: Radiation Oncology

## 2015-04-25 DIAGNOSIS — Z510 Encounter for antineoplastic radiation therapy: Secondary | ICD-10-CM | POA: Diagnosis not present

## 2015-04-25 DIAGNOSIS — C61 Malignant neoplasm of prostate: Secondary | ICD-10-CM

## 2015-04-25 NOTE — Progress Notes (Signed)
Chart note: The patient began his radiation therapy in the management of his carcinoma the prostate.  He is being treated with dual ARC VMAT IMRT with 2 sets of dynamic MLCs corresponding to one set of IMRT treatment devices (78412).

## 2015-04-26 ENCOUNTER — Ambulatory Visit
Admission: RE | Admit: 2015-04-26 | Discharge: 2015-04-26 | Disposition: A | Discharge status: Home / Self Care | Payer: Medicare Other | Source: Ambulatory Visit | Attending: Radiation Oncology | Admitting: Radiation Oncology

## 2015-04-26 DIAGNOSIS — Z510 Encounter for antineoplastic radiation therapy: Secondary | ICD-10-CM | POA: Diagnosis not present

## 2015-04-26 DIAGNOSIS — C61 Malignant neoplasm of prostate: Secondary | ICD-10-CM | POA: Diagnosis not present

## 2015-04-27 ENCOUNTER — Ambulatory Visit
Admission: RE | Admit: 2015-04-27 | Discharge: 2015-04-27 | Disposition: A | Discharge status: Home / Self Care | Payer: Medicare Other | Source: Ambulatory Visit | Attending: Radiation Oncology | Admitting: Radiation Oncology

## 2015-04-27 DIAGNOSIS — Z510 Encounter for antineoplastic radiation therapy: Secondary | ICD-10-CM | POA: Diagnosis not present

## 2015-04-27 DIAGNOSIS — C61 Malignant neoplasm of prostate: Secondary | ICD-10-CM | POA: Diagnosis not present

## 2015-04-30 ENCOUNTER — Ambulatory Visit
Admission: RE | Admit: 2015-04-30 | Discharge: 2015-04-30 | Disposition: A | Discharge status: Home / Self Care | Payer: Medicare Other | Source: Ambulatory Visit | Attending: Radiation Oncology | Admitting: Radiation Oncology

## 2015-04-30 ENCOUNTER — Encounter: Payer: Self-pay | Admitting: Radiation Oncology

## 2015-04-30 VITALS — BP 163/85 | HR 70 | Temp 98.3°F | Ht 72.0 in | Wt 225.5 lb

## 2015-04-30 DIAGNOSIS — Z510 Encounter for antineoplastic radiation therapy: Secondary | ICD-10-CM | POA: Diagnosis not present

## 2015-04-30 DIAGNOSIS — C61 Malignant neoplasm of prostate: Secondary | ICD-10-CM

## 2015-04-30 NOTE — Progress Notes (Signed)
Pt here for patient teaching.  Pt given Radiation and You booklet, skin care instructions, Alra deodorant and Radiaplex gel. Pt reports they have watched the Radiation Therapy Education video on April 20, 2015.  Reviewed areas of pertinence such as fatigue, skin changes, breast tenderness, breast swelling, cough, shortness of breath, earaches and taste changes . Pt able to give teach back of to pat skin, use unscented/gentle soap and drink plenty of water,apply Radiaplex bid, avoid applying anything to skin within 4 hours of treatment, avoid wearing an under wire bra and to use an electric razor if they must shave. Pt demonstrated understanding and verbalizes understanding of information given and will contact nursing with any questions or concerns.

## 2015-04-30 NOTE — Addendum Note (Signed)
Encounter addended by: Ernst Spell, RN on: 04/30/2015  6:46 PM<BR>     Documentation filed: Notes Section, Inpatient Patient Education

## 2015-04-30 NOTE — Progress Notes (Signed)
Weekly Management Note:  Site: prostate bed Current Dose:   800  cGy Projected Dose:  6600  cGy  Narrative: The patient is seen today for routine under treatment assessment. CBCT/MVCT images/port films were reviewed. The chart was reviewed.    Bladder filling satisfactory.  No new GU or GI difficulties.  He does report some loosening of his bowels which I do not believe is secondary to his radiation therapy.  Physical Examination:  Filed Vitals:   04/30/15 1804  BP: 163/85  Pulse: 70  Temp: 98.3 F (36.8 C)  .  Weight: 225 lb 8 oz (102.286 kg).  No change.  Impression: Tolerating radiation therapy well.  Plan: Continue radiation therapy as planned.

## 2015-04-30 NOTE — Progress Notes (Signed)
Devin Mercado is here for his 4th fraction of radiation to his prostate. He denies any pain at this time. He does report about two loose stools per day, but is able to manage this. He denies any urinary symptoms at this time including nocturia, frequency, or urgency.  BP 163/85 mmHg  Pulse 70  Temp(Src) 98.3 F (36.8 C)  Ht 6' (1.829 m)  Wt 225 lb 8 oz (102.286 kg)  BMI 30.58 kg/m2   Wt Readings from Last 3 Encounters:  04/30/15 225 lb 8 oz (102.286 kg)  03/06/15 220 lb (99.791 kg)  11/06/14 215 lb (97.523 kg)

## 2015-05-01 ENCOUNTER — Ambulatory Visit
Admission: RE | Admit: 2015-05-01 | Discharge: 2015-05-01 | Disposition: A | Discharge status: Home / Self Care | Payer: Medicare Other | Source: Ambulatory Visit | Attending: Radiation Oncology | Admitting: Radiation Oncology

## 2015-05-01 DIAGNOSIS — Z510 Encounter for antineoplastic radiation therapy: Secondary | ICD-10-CM | POA: Diagnosis not present

## 2015-05-01 DIAGNOSIS — C61 Malignant neoplasm of prostate: Secondary | ICD-10-CM | POA: Diagnosis not present

## 2015-05-02 ENCOUNTER — Encounter: Payer: Self-pay | Admitting: Medical Oncology

## 2015-05-02 ENCOUNTER — Ambulatory Visit
Admission: RE | Admit: 2015-05-02 | Discharge: 2015-05-02 | Disposition: A | Discharge status: Home / Self Care | Payer: Medicare Other | Source: Ambulatory Visit | Attending: Radiation Oncology | Admitting: Radiation Oncology

## 2015-05-02 DIAGNOSIS — C61 Malignant neoplasm of prostate: Secondary | ICD-10-CM | POA: Diagnosis not present

## 2015-05-02 DIAGNOSIS — Z510 Encounter for antineoplastic radiation therapy: Secondary | ICD-10-CM | POA: Diagnosis not present

## 2015-05-02 NOTE — Progress Notes (Signed)
Oncology Nurse Navigator Documentation  Oncology Nurse Navigator Flowsheets 04/16/2015 05/02/2015 05/02/2015  Referral date to RadOnc/MedOnc - 05/01/2015 05/01/2015  Navigator Encounter Type - Treatment-Devin Mercado doing well with his radiation treatments. He has had some loose stools but other wise doing well. 6 month-Post robotic prostatectomy- Pt has recovered well from his surgery. Currently receiving radiation therapy for recurrence but doing well.   Patient Visit Type Radonc - -  Treatment Phase CT SIM Treatment -  Barriers/Navigation Needs No barriers at this time No barriers at this time -  Support Groups/Services Friends and Family Friends and Family -  Time Spent with Patient 15 15 -

## 2015-05-03 ENCOUNTER — Ambulatory Visit
Admission: RE | Admit: 2015-05-03 | Discharge: 2015-05-03 | Disposition: A | Discharge status: Home / Self Care | Payer: Medicare Other | Source: Ambulatory Visit | Attending: Radiation Oncology | Admitting: Radiation Oncology

## 2015-05-03 DIAGNOSIS — C61 Malignant neoplasm of prostate: Secondary | ICD-10-CM | POA: Diagnosis not present

## 2015-05-03 DIAGNOSIS — Z510 Encounter for antineoplastic radiation therapy: Secondary | ICD-10-CM | POA: Diagnosis not present

## 2015-05-04 ENCOUNTER — Ambulatory Visit
Admission: RE | Admit: 2015-05-04 | Discharge: 2015-05-04 | Disposition: A | Discharge status: Home / Self Care | Payer: Medicare Other | Source: Ambulatory Visit | Attending: Radiation Oncology | Admitting: Radiation Oncology

## 2015-05-04 DIAGNOSIS — C61 Malignant neoplasm of prostate: Secondary | ICD-10-CM | POA: Diagnosis not present

## 2015-05-04 DIAGNOSIS — Z510 Encounter for antineoplastic radiation therapy: Secondary | ICD-10-CM | POA: Diagnosis not present

## 2015-05-07 ENCOUNTER — Ambulatory Visit
Admission: RE | Admit: 2015-05-07 | Discharge: 2015-05-07 | Disposition: A | Discharge status: Home / Self Care | Payer: Medicare Other | Source: Ambulatory Visit | Attending: Radiation Oncology | Admitting: Radiation Oncology

## 2015-05-07 ENCOUNTER — Encounter: Payer: Self-pay | Admitting: Radiation Oncology

## 2015-05-07 VITALS — BP 143/88 | HR 62 | Temp 98.0°F | Resp 16 | Wt 225.4 lb

## 2015-05-07 DIAGNOSIS — C61 Malignant neoplasm of prostate: Secondary | ICD-10-CM | POA: Diagnosis not present

## 2015-05-07 DIAGNOSIS — Z510 Encounter for antineoplastic radiation therapy: Secondary | ICD-10-CM | POA: Diagnosis not present

## 2015-05-07 NOTE — Progress Notes (Addendum)
Weekly rad txs prostate, no  Dysuria,nocturia,hematuria or frequency, stated I'm good" no pain, no issues, bowels regular BP 143/88 mmHg  Pulse 62  Temp(Src) 98 F (36.7 C) (Oral)  Resp 16  Wt 225 lb 6.4 oz (102.241 kg)  Wt Readings from Last 3 Encounters:  05/07/15 225 lb 6.4 oz (102.241 kg)  04/30/15 225 lb 8 oz (102.286 kg)  03/06/15 220 lb (99.791 kg)

## 2015-05-07 NOTE — Progress Notes (Signed)
Weekly Management Note:  Site: Prostate bed Current Dose:  1800  cGy Projected Dose: 6600  cGy  Narrative: The patient is seen today for routine under treatment assessment. CBCT/MVCT images/port films were reviewed. The chart was reviewed.   Bladder filling is less than ideal today.  His rectum is slightly distended with stool.  Physical Examination:  Filed Vitals:   05/07/15 0845  BP: 143/88  Pulse: 62  Temp: 98 F (36.7 C)  Resp: 16  .  Weight: 225 lb 6.4 oz (102.241 kg).  No change.  Impression: Tolerating radiation therapy well.  We again discussed bladder filling and preferably and empty rectum as possible.  His less than ideal bladder filling in stool the rectum is probably compared about treating him in the morning instead of late the day.  He understands our goals.  Plan: Continue radiation therapy as planned.

## 2015-05-08 ENCOUNTER — Ambulatory Visit
Admission: RE | Admit: 2015-05-08 | Discharge: 2015-05-08 | Disposition: A | Discharge status: Home / Self Care | Payer: Medicare Other | Source: Ambulatory Visit | Attending: Radiation Oncology | Admitting: Radiation Oncology

## 2015-05-08 DIAGNOSIS — C61 Malignant neoplasm of prostate: Secondary | ICD-10-CM | POA: Diagnosis not present

## 2015-05-08 DIAGNOSIS — Z510 Encounter for antineoplastic radiation therapy: Secondary | ICD-10-CM | POA: Diagnosis not present

## 2015-05-09 ENCOUNTER — Ambulatory Visit
Admission: RE | Admit: 2015-05-09 | Discharge: 2015-05-09 | Disposition: A | Discharge status: Home / Self Care | Payer: Medicare Other | Source: Ambulatory Visit | Attending: Radiation Oncology | Admitting: Radiation Oncology

## 2015-05-09 DIAGNOSIS — Z510 Encounter for antineoplastic radiation therapy: Secondary | ICD-10-CM | POA: Diagnosis not present

## 2015-05-09 DIAGNOSIS — C61 Malignant neoplasm of prostate: Secondary | ICD-10-CM | POA: Diagnosis not present

## 2015-05-10 ENCOUNTER — Ambulatory Visit
Admission: RE | Admit: 2015-05-10 | Discharge: 2015-05-10 | Disposition: A | Discharge status: Home / Self Care | Payer: Medicare Other | Source: Ambulatory Visit | Attending: Radiation Oncology | Admitting: Radiation Oncology

## 2015-05-10 DIAGNOSIS — C61 Malignant neoplasm of prostate: Secondary | ICD-10-CM | POA: Diagnosis not present

## 2015-05-10 DIAGNOSIS — Z510 Encounter for antineoplastic radiation therapy: Secondary | ICD-10-CM | POA: Diagnosis not present

## 2015-05-11 ENCOUNTER — Ambulatory Visit (INDEPENDENT_AMBULATORY_CARE_PROVIDER_SITE_OTHER): Payer: Medicare Other | Admitting: Family Medicine

## 2015-05-11 ENCOUNTER — Ambulatory Visit
Admission: RE | Admit: 2015-05-11 | Discharge: 2015-05-11 | Disposition: A | Discharge status: Home / Self Care | Payer: Medicare Other | Source: Ambulatory Visit | Attending: Radiation Oncology | Admitting: Radiation Oncology

## 2015-05-11 DIAGNOSIS — Z23 Encounter for immunization: Secondary | ICD-10-CM

## 2015-05-11 DIAGNOSIS — C61 Malignant neoplasm of prostate: Secondary | ICD-10-CM | POA: Diagnosis not present

## 2015-05-11 DIAGNOSIS — Z510 Encounter for antineoplastic radiation therapy: Secondary | ICD-10-CM | POA: Diagnosis not present

## 2015-05-14 ENCOUNTER — Ambulatory Visit
Admission: RE | Admit: 2015-05-14 | Discharge: 2015-05-14 | Disposition: A | Discharge status: Home / Self Care | Payer: Medicare Other | Source: Ambulatory Visit | Attending: Radiation Oncology | Admitting: Radiation Oncology

## 2015-05-14 ENCOUNTER — Encounter: Payer: Self-pay | Admitting: Radiation Oncology

## 2015-05-14 VITALS — BP 133/74 | HR 63 | Temp 98.3°F | Ht 72.0 in | Wt 226.6 lb

## 2015-05-14 DIAGNOSIS — Z510 Encounter for antineoplastic radiation therapy: Secondary | ICD-10-CM | POA: Diagnosis not present

## 2015-05-14 DIAGNOSIS — C61 Malignant neoplasm of prostate: Secondary | ICD-10-CM | POA: Diagnosis not present

## 2015-05-14 NOTE — Progress Notes (Signed)
Devin Mercado reports that he is voiding without pain, nor signifcant change in his urination patten.  Nocturia x 1.  No rectal irritation, nor diarrhea.

## 2015-05-14 NOTE — Progress Notes (Signed)
Weekly Management Note:  Site: Prostate bed Current Dose:  2800  cGy Projected Dose: 6600  cGy  Narrative: The patient is seen today for routine under treatment assessment. CBCT/MVCT images/port films were reviewed. The chart was reviewed.   Bladder filling remains excellent.  No new GU or GI difficulties.  Physical Examination:  Filed Vitals:   05/14/15 0908  BP: 133/74  Pulse: 63  Temp: 98.3 F (36.8 C)  .  Weight: 226 lb 9.6 oz (102.785 kg).  No change.  Impression: Tolerating radiation therapy well.  Plan: Continue radiation therapy as planned.

## 2015-05-15 ENCOUNTER — Ambulatory Visit
Admission: RE | Admit: 2015-05-15 | Discharge: 2015-05-15 | Disposition: A | Discharge status: Home / Self Care | Payer: Medicare Other | Source: Ambulatory Visit | Attending: Radiation Oncology | Admitting: Radiation Oncology

## 2015-05-15 DIAGNOSIS — C61 Malignant neoplasm of prostate: Secondary | ICD-10-CM | POA: Diagnosis not present

## 2015-05-15 DIAGNOSIS — Z510 Encounter for antineoplastic radiation therapy: Secondary | ICD-10-CM | POA: Diagnosis not present

## 2015-05-16 ENCOUNTER — Ambulatory Visit
Admission: RE | Admit: 2015-05-16 | Discharge: 2015-05-16 | Disposition: A | Discharge status: Home / Self Care | Payer: Medicare Other | Source: Ambulatory Visit | Attending: Radiation Oncology | Admitting: Radiation Oncology

## 2015-05-16 DIAGNOSIS — C61 Malignant neoplasm of prostate: Secondary | ICD-10-CM | POA: Diagnosis not present

## 2015-05-16 DIAGNOSIS — Z510 Encounter for antineoplastic radiation therapy: Secondary | ICD-10-CM | POA: Diagnosis not present

## 2015-05-17 ENCOUNTER — Ambulatory Visit
Admission: RE | Admit: 2015-05-17 | Discharge: 2015-05-17 | Disposition: A | Discharge status: Home / Self Care | Payer: Medicare Other | Source: Ambulatory Visit | Attending: Radiation Oncology | Admitting: Radiation Oncology

## 2015-05-17 DIAGNOSIS — Z510 Encounter for antineoplastic radiation therapy: Secondary | ICD-10-CM | POA: Diagnosis not present

## 2015-05-17 DIAGNOSIS — C61 Malignant neoplasm of prostate: Secondary | ICD-10-CM | POA: Diagnosis not present

## 2015-05-18 ENCOUNTER — Ambulatory Visit
Admission: RE | Admit: 2015-05-18 | Discharge: 2015-05-18 | Disposition: A | Discharge status: Home / Self Care | Payer: Medicare Other | Source: Ambulatory Visit | Attending: Radiation Oncology | Admitting: Radiation Oncology

## 2015-05-18 DIAGNOSIS — Z510 Encounter for antineoplastic radiation therapy: Secondary | ICD-10-CM | POA: Diagnosis not present

## 2015-05-18 DIAGNOSIS — C61 Malignant neoplasm of prostate: Secondary | ICD-10-CM | POA: Diagnosis not present

## 2015-05-21 ENCOUNTER — Encounter: Payer: Self-pay | Admitting: Radiation Oncology

## 2015-05-21 ENCOUNTER — Ambulatory Visit
Admission: RE | Admit: 2015-05-21 | Discharge: 2015-05-21 | Disposition: A | Discharge status: Home / Self Care | Payer: Medicare Other | Source: Ambulatory Visit | Attending: Radiation Oncology | Admitting: Radiation Oncology

## 2015-05-21 ENCOUNTER — Encounter: Payer: Self-pay | Admitting: Medical Oncology

## 2015-05-21 VITALS — BP 144/63 | HR 63 | Temp 98.3°F | Resp 20 | Wt 224.9 lb

## 2015-05-21 DIAGNOSIS — C61 Malignant neoplasm of prostate: Secondary | ICD-10-CM | POA: Diagnosis not present

## 2015-05-21 DIAGNOSIS — Z510 Encounter for antineoplastic radiation therapy: Secondary | ICD-10-CM | POA: Diagnosis not present

## 2015-05-21 NOTE — Progress Notes (Signed)
Weekly Management Note:  Site: Prostate bed Current Dose:  3800  cGy Projected Dose: 6600  cGy  Narrative: The patient is seen today for routine under treatment assessment. CBCT/MVCT images/port films were reviewed. The chart was reviewed.   Bladder filling remains excellent.  No significant GU or GI difficulty.  His treatment setup remains excellent.  Physical Examination:  Filed Vitals:   05/21/15 0817  BP: 144/63  Pulse: 63  Temp: 98.3 F (36.8 C)  Resp: 20  .  Weight: 224 lb 14.4 oz (102.014 kg).  No change.  Impression: Tolerating radiation therapy well.  Plan: Continue radiation therapy as planned.

## 2015-05-21 NOTE — Progress Notes (Signed)
Weekly rad tx prostate, no dysuria, regular bowels, no c/o, appetitre good 8:20 AM BP 144/63 mmHg  Pulse 63  Temp(Src) 98.3 F (36.8 C) (Oral)  Resp 20  Wt 224 lb 14.4 oz (102.014 kg)  Wt Readings from Last 3 Encounters:  05/21/15 224 lb 14.4 oz (102.014 kg)  05/14/15 226 lb 9.6 oz (102.785 kg)  05/07/15 225 lb 6.4 oz (102.241 kg)

## 2015-05-22 ENCOUNTER — Ambulatory Visit
Admission: RE | Admit: 2015-05-22 | Discharge: 2015-05-22 | Disposition: A | Discharge status: Home / Self Care | Payer: Medicare Other | Source: Ambulatory Visit | Attending: Radiation Oncology | Admitting: Radiation Oncology

## 2015-05-22 DIAGNOSIS — C61 Malignant neoplasm of prostate: Secondary | ICD-10-CM | POA: Diagnosis not present

## 2015-05-22 DIAGNOSIS — Z510 Encounter for antineoplastic radiation therapy: Secondary | ICD-10-CM | POA: Diagnosis not present

## 2015-05-23 ENCOUNTER — Ambulatory Visit
Admission: RE | Admit: 2015-05-23 | Discharge: 2015-05-23 | Disposition: A | Discharge status: Home / Self Care | Payer: Medicare Other | Source: Ambulatory Visit | Attending: Radiation Oncology | Admitting: Radiation Oncology

## 2015-05-23 DIAGNOSIS — Z510 Encounter for antineoplastic radiation therapy: Secondary | ICD-10-CM | POA: Diagnosis not present

## 2015-05-23 DIAGNOSIS — C61 Malignant neoplasm of prostate: Secondary | ICD-10-CM | POA: Diagnosis not present

## 2015-05-24 ENCOUNTER — Ambulatory Visit
Admission: RE | Admit: 2015-05-24 | Discharge: 2015-05-24 | Disposition: A | Discharge status: Home / Self Care | Payer: Medicare Other | Source: Ambulatory Visit | Attending: Radiation Oncology | Admitting: Radiation Oncology

## 2015-05-24 DIAGNOSIS — C61 Malignant neoplasm of prostate: Secondary | ICD-10-CM | POA: Diagnosis not present

## 2015-05-24 DIAGNOSIS — Z510 Encounter for antineoplastic radiation therapy: Secondary | ICD-10-CM | POA: Diagnosis not present

## 2015-05-25 ENCOUNTER — Ambulatory Visit
Admission: RE | Admit: 2015-05-25 | Discharge: 2015-05-25 | Disposition: A | Discharge status: Home / Self Care | Payer: Medicare Other | Source: Ambulatory Visit | Attending: Radiation Oncology | Admitting: Radiation Oncology

## 2015-05-25 DIAGNOSIS — C61 Malignant neoplasm of prostate: Secondary | ICD-10-CM | POA: Diagnosis not present

## 2015-05-25 DIAGNOSIS — Z510 Encounter for antineoplastic radiation therapy: Secondary | ICD-10-CM | POA: Diagnosis not present

## 2015-05-28 ENCOUNTER — Encounter: Payer: Self-pay | Admitting: Radiation Oncology

## 2015-05-28 ENCOUNTER — Ambulatory Visit
Admission: RE | Admit: 2015-05-28 | Discharge: 2015-05-28 | Disposition: A | Discharge status: Home / Self Care | Payer: Medicare Other | Source: Ambulatory Visit | Attending: Radiation Oncology | Admitting: Radiation Oncology

## 2015-05-28 VITALS — BP 137/73 | HR 62 | Temp 97.7°F | Resp 18 | Wt 226.7 lb

## 2015-05-28 DIAGNOSIS — C61 Malignant neoplasm of prostate: Secondary | ICD-10-CM

## 2015-05-28 DIAGNOSIS — Z510 Encounter for antineoplastic radiation therapy: Secondary | ICD-10-CM | POA: Diagnosis not present

## 2015-05-28 NOTE — Progress Notes (Signed)
Weekly Management Note:  Site: Prostate bed Current Dose:  4800  cGy Projected Dose: 6600  cGy  Narrative: The patient is seen today for routine under treatment assessment. CBCT/MVCT images/port films were reviewed. The chart was reviewed.   Bladder filling remains excellent.  No significant GU or GI difficulties.  Physical Examination:  Filed Vitals:   05/28/15 0826  BP: 137/73  Pulse: 62  Temp: 97.7 F (36.5 C)  Resp: 18  .  Weight: 226 lb 11.2 oz (102.83 kg).  No change.  Impression: Tolerating radiation therapy well.  Plan: Continue radiation therapy as planned.

## 2015-05-28 NOTE — Progress Notes (Signed)
Weekly rad txs prostate 24/33 completed, no dysuria, regular bowel movements, no pain, no changes,  8:28 AM BP 137/73 mmHg  Pulse 62  Temp(Src) 97.7 F (36.5 C) (Oral)  Resp 18  Wt 226 lb 11.2 oz (102.83 kg)  Wt Readings from Last 3 Encounters:  05/28/15 226 lb 11.2 oz (102.83 kg)  05/21/15 224 lb 14.4 oz (102.014 kg)  05/14/15 226 lb 9.6 oz (102.785 kg)

## 2015-05-29 ENCOUNTER — Ambulatory Visit
Admission: RE | Admit: 2015-05-29 | Discharge: 2015-05-29 | Disposition: A | Discharge status: Home / Self Care | Payer: Medicare Other | Source: Ambulatory Visit | Attending: Radiation Oncology | Admitting: Radiation Oncology

## 2015-05-29 DIAGNOSIS — Z510 Encounter for antineoplastic radiation therapy: Secondary | ICD-10-CM | POA: Diagnosis not present

## 2015-05-29 DIAGNOSIS — C61 Malignant neoplasm of prostate: Secondary | ICD-10-CM | POA: Diagnosis not present

## 2015-05-30 ENCOUNTER — Ambulatory Visit
Admission: RE | Admit: 2015-05-30 | Discharge: 2015-05-30 | Disposition: A | Discharge status: Home / Self Care | Payer: Medicare Other | Source: Ambulatory Visit | Attending: Radiation Oncology | Admitting: Radiation Oncology

## 2015-05-30 DIAGNOSIS — C61 Malignant neoplasm of prostate: Secondary | ICD-10-CM | POA: Diagnosis not present

## 2015-05-30 DIAGNOSIS — Z510 Encounter for antineoplastic radiation therapy: Secondary | ICD-10-CM | POA: Diagnosis not present

## 2015-05-31 ENCOUNTER — Ambulatory Visit
Admission: RE | Admit: 2015-05-31 | Discharge: 2015-05-31 | Disposition: A | Discharge status: Home / Self Care | Payer: Medicare Other | Source: Ambulatory Visit | Attending: Radiation Oncology | Admitting: Radiation Oncology

## 2015-05-31 DIAGNOSIS — C61 Malignant neoplasm of prostate: Secondary | ICD-10-CM | POA: Diagnosis not present

## 2015-05-31 DIAGNOSIS — Z510 Encounter for antineoplastic radiation therapy: Secondary | ICD-10-CM | POA: Diagnosis not present

## 2015-06-01 ENCOUNTER — Ambulatory Visit
Admission: RE | Admit: 2015-06-01 | Discharge: 2015-06-01 | Disposition: A | Discharge status: Home / Self Care | Payer: Medicare Other | Source: Ambulatory Visit | Attending: Radiation Oncology | Admitting: Radiation Oncology

## 2015-06-01 ENCOUNTER — Other Ambulatory Visit (INDEPENDENT_AMBULATORY_CARE_PROVIDER_SITE_OTHER): Payer: Medicare Other

## 2015-06-01 DIAGNOSIS — E785 Hyperlipidemia, unspecified: Secondary | ICD-10-CM | POA: Diagnosis not present

## 2015-06-01 DIAGNOSIS — R300 Dysuria: Secondary | ICD-10-CM

## 2015-06-01 DIAGNOSIS — I10 Essential (primary) hypertension: Secondary | ICD-10-CM

## 2015-06-01 DIAGNOSIS — C61 Malignant neoplasm of prostate: Secondary | ICD-10-CM | POA: Diagnosis not present

## 2015-06-01 DIAGNOSIS — E039 Hypothyroidism, unspecified: Secondary | ICD-10-CM

## 2015-06-01 DIAGNOSIS — D649 Anemia, unspecified: Secondary | ICD-10-CM | POA: Diagnosis not present

## 2015-06-01 DIAGNOSIS — N400 Enlarged prostate without lower urinary tract symptoms: Secondary | ICD-10-CM | POA: Diagnosis not present

## 2015-06-01 DIAGNOSIS — Z510 Encounter for antineoplastic radiation therapy: Secondary | ICD-10-CM | POA: Diagnosis not present

## 2015-06-01 LAB — LIPID PANEL
CHOL/HDL RATIO: 3
Cholesterol: 139 mg/dL (ref 0–200)
HDL: 46.8 mg/dL (ref >39.00)
LDL CALC: 79 mg/dL (ref 0–99)
NonHDL: 92.2
TRIGLYCERIDES: 64 mg/dL (ref 0.0–149.0)
VLDL: 12.8 mg/dL (ref 0.0–40.0)

## 2015-06-01 LAB — BASIC METABOLIC PANEL
BUN: 23 mg/dL (ref 6–23)
CO2: 27 mEq/L (ref 19–32)
Calcium: 8.9 mg/dL (ref 8.4–10.5)
Chloride: 106 mEq/L (ref 96–112)
Creatinine, Ser: 1.32 mg/dL (ref 0.40–1.50)
GFR: 57.55 mL/min — AB (ref >60.00)
GLUCOSE: 112 mg/dL — AB (ref 70–99)
POTASSIUM: 4.8 meq/L (ref 3.5–5.1)
SODIUM: 141 meq/L (ref 135–145)

## 2015-06-01 LAB — POCT URINALYSIS DIPSTICK
BILIRUBIN UA: NEGATIVE
GLUCOSE UA: NEGATIVE
Ketones, UA: NEGATIVE
Leukocytes, UA: NEGATIVE
Nitrite, UA: NEGATIVE
Protein, UA: NEGATIVE
SPEC GRAV UA: 1.01
Urobilinogen, UA: 0.2
pH, UA: 5.5

## 2015-06-01 LAB — CBC WITH DIFFERENTIAL/PLATELET
Basophils Absolute: 0 10*3/uL (ref 0.0–0.1)
Basophils Relative: 0.3 % (ref 0.0–3.0)
EOS PCT: 1.4 % (ref 0.0–5.0)
Eosinophils Absolute: 0.1 10*3/uL (ref 0.0–0.7)
HEMATOCRIT: 42.3 % (ref 39.0–52.0)
HEMOGLOBIN: 14.1 g/dL (ref 13.0–17.0)
LYMPHS PCT: 10.5 % — AB (ref 12.0–46.0)
Lymphs Abs: 0.5 10*3/uL — ABNORMAL LOW (ref 0.7–4.0)
MCHC: 33.3 g/dL (ref 30.0–36.0)
MCV: 87.2 fl (ref 78.0–100.0)
MONOS PCT: 9.1 % (ref 3.0–12.0)
Monocytes Absolute: 0.5 10*3/uL (ref 0.1–1.0)
Neutro Abs: 4.1 10*3/uL (ref 1.4–7.7)
Neutrophils Relative %: 78.7 % — ABNORMAL HIGH (ref 43.0–77.0)
Platelets: 184 10*3/uL (ref 150.0–400.0)
RBC: 4.85 Mil/uL (ref 4.22–5.81)
RDW: 14.4 % (ref 11.5–15.5)
WBC: 5.3 10*3/uL (ref 4.0–10.5)

## 2015-06-01 LAB — HEPATIC FUNCTION PANEL
ALBUMIN: 4.2 g/dL (ref 3.5–5.2)
ALT: 20 U/L (ref 0–53)
AST: 16 U/L (ref 0–37)
Alkaline Phosphatase: 54 U/L (ref 39–117)
Bilirubin, Direct: 0.1 mg/dL (ref 0.0–0.3)
Total Bilirubin: 0.6 mg/dL (ref 0.2–1.2)
Total Protein: 6.4 g/dL (ref 6.0–8.3)

## 2015-06-01 LAB — PSA: PSA: 0.26 ng/mL (ref 0.10–4.00)

## 2015-06-01 LAB — TSH: TSH: 2.6 u[IU]/mL (ref 0.35–4.50)

## 2015-06-04 ENCOUNTER — Encounter: Payer: Self-pay | Admitting: Medical Oncology

## 2015-06-04 ENCOUNTER — Encounter: Payer: Self-pay | Admitting: Radiation Oncology

## 2015-06-04 ENCOUNTER — Ambulatory Visit
Admission: RE | Admit: 2015-06-04 | Discharge: 2015-06-04 | Disposition: A | Discharge status: Home / Self Care | Payer: Medicare Other | Source: Ambulatory Visit | Attending: Radiation Oncology | Admitting: Radiation Oncology

## 2015-06-04 VITALS — BP 149/85 | HR 59 | Temp 97.9°F | Resp 12 | Wt 224.9 lb

## 2015-06-04 DIAGNOSIS — C61 Malignant neoplasm of prostate: Secondary | ICD-10-CM | POA: Diagnosis not present

## 2015-06-04 DIAGNOSIS — Z510 Encounter for antineoplastic radiation therapy: Secondary | ICD-10-CM | POA: Diagnosis not present

## 2015-06-04 NOTE — Progress Notes (Signed)
PAIN: He is currently in no pain.  URINARY: Pt reports no new urinary symptoms. Pt states they urinate 0 - 1 times per night.  BOWEL: Pt reports a soft bowel movement everyday/everyother day.  BP 149/85 mmHg  Pulse 59  Temp(Src) 97.9 F (36.6 C) (Oral)  Resp 12  Wt 224 lb 14.4 oz (102.014 kg)  SpO2 100% Wt Readings from Last 3 Encounters:  06/04/15 224 lb 14.4 oz (102.014 kg)  05/28/15 226 lb 11.2 oz (102.83 kg)  05/21/15 224 lb 14.4 oz (102.014 kg)

## 2015-06-04 NOTE — Progress Notes (Signed)
Weekly Management Note:  Site: Prostate bed Current Dose:  5800  cGy Projected Dose: 6600  cGy  Narrative: The patient is seen today for routine under treatment assessment. CBCT/MVCT images/port films were reviewed. The chart was reviewed.   Bladder filling is excellent.  No new GU or GI difficulties.  Physical Examination:  Filed Vitals:   06/04/15 0825  BP: 149/85  Pulse: 59  Temp: 97.9 F (36.6 C)  Resp: 12  .  Weight: 224 lb 14.4 oz (102.014 kg).  No change.  Impression: Tolerating radiation therapy well.  Plan: Continue radiation therapy as planned.

## 2015-06-05 ENCOUNTER — Ambulatory Visit
Admission: RE | Admit: 2015-06-05 | Discharge: 2015-06-05 | Disposition: A | Discharge status: Home / Self Care | Payer: Medicare Other | Source: Ambulatory Visit | Attending: Radiation Oncology | Admitting: Radiation Oncology

## 2015-06-05 DIAGNOSIS — C61 Malignant neoplasm of prostate: Secondary | ICD-10-CM | POA: Diagnosis not present

## 2015-06-05 DIAGNOSIS — Z510 Encounter for antineoplastic radiation therapy: Secondary | ICD-10-CM | POA: Diagnosis not present

## 2015-06-06 ENCOUNTER — Ambulatory Visit
Admission: RE | Admit: 2015-06-06 | Discharge: 2015-06-06 | Disposition: A | Discharge status: Home / Self Care | Payer: Medicare Other | Source: Ambulatory Visit | Attending: Radiation Oncology | Admitting: Radiation Oncology

## 2015-06-06 DIAGNOSIS — Z510 Encounter for antineoplastic radiation therapy: Secondary | ICD-10-CM | POA: Diagnosis not present

## 2015-06-06 DIAGNOSIS — C61 Malignant neoplasm of prostate: Secondary | ICD-10-CM | POA: Diagnosis not present

## 2015-06-08 ENCOUNTER — Ambulatory Visit: Payer: Medicare Other

## 2015-06-11 ENCOUNTER — Ambulatory Visit
Admission: RE | Admit: 2015-06-11 | Discharge: 2015-06-11 | Disposition: A | Discharge status: Home / Self Care | Payer: Medicare Other | Source: Ambulatory Visit | Attending: Radiation Oncology | Admitting: Radiation Oncology

## 2015-06-11 ENCOUNTER — Ambulatory Visit: Payer: Medicare Other

## 2015-06-11 ENCOUNTER — Encounter: Payer: Self-pay | Admitting: Radiation Oncology

## 2015-06-11 VITALS — BP 151/77 | HR 65 | Temp 97.9°F | Resp 20 | Wt 224.0 lb

## 2015-06-11 DIAGNOSIS — C61 Malignant neoplasm of prostate: Secondary | ICD-10-CM

## 2015-06-11 DIAGNOSIS — Z510 Encounter for antineoplastic radiation therapy: Secondary | ICD-10-CM | POA: Diagnosis not present

## 2015-06-11 NOTE — Progress Notes (Signed)
CC: Dr. [name], Dr. Garret Reddish   Weekly Management Note:  Site: Prostate bed Current Dose:  6400  cGy Projected Dose: 6600  cGy  Narrative: The patient is seen today for routine under treatment assessment. CBCT/MVCT images/port films were reviewed. The chart was reviewed.   Bladder filling remains excellent.  No significant GU or GI difficulties.  He tells me that his primary care physician, Dr. Garret Reddish obtained a PSA last week and this was elevated further at 0.26 up from 0.04 on 02/16/2015.  A PSA was obtained because he is having an annual physical next week.  Physical Examination:  Filed Vitals:   06/11/15 0825  BP: 151/77  Pulse: 65  Temp: 97.9 F (36.6 C)  Resp: 20  .  Weight: 224 lb (101.606 kg).  No change.  Impression: Tolerating radiation therapy remarkably well.  I am concerned about his recent PSA of 0.26.  Is not unusual to see an elevation of PSA during or soon after radiation therapy with treatment of disease, but this elevation is quite significant in that his previous PSA was only 0.04 in August..  He is scheduled for a follow-up visit with [name] in February.  I'm not sure of [name] was to repeat his PSA before then.  He will finish his radiation therapy tomorrow.  Plan: Follow-up visit with me in December.  He is out of town in mid December, and I told him he can simply call me to give me an update on his progress.

## 2015-06-11 NOTE — Progress Notes (Signed)
Weekly rad txs [prostate 32/33 completed, no pain, no dysuria,hematuria, no nocturia, good flow,  Mild fatigue  8:26 AM BP 151/77 mmHg  Pulse 65  Temp(Src) 97.9 F (36.6 C) (Oral)  Resp 20  Wt 224 lb (101.606 kg)  Wt Readings from Last 3 Encounters:  06/11/15 224 lb (101.606 kg)  06/04/15 224 lb 14.4 oz (102.014 kg)  05/28/15 226 lb 11.2 oz (102.83 kg)

## 2015-06-12 ENCOUNTER — Encounter: Payer: Self-pay | Admitting: Radiation Oncology

## 2015-06-12 ENCOUNTER — Encounter: Payer: Self-pay | Admitting: Family Medicine

## 2015-06-12 ENCOUNTER — Ambulatory Visit (INDEPENDENT_AMBULATORY_CARE_PROVIDER_SITE_OTHER): Payer: Medicare Other | Admitting: Family Medicine

## 2015-06-12 ENCOUNTER — Ambulatory Visit
Admission: RE | Admit: 2015-06-12 | Discharge: 2015-06-12 | Disposition: A | Discharge status: Home / Self Care | Payer: Medicare Other | Source: Ambulatory Visit | Attending: Radiation Oncology | Admitting: Radiation Oncology

## 2015-06-12 VITALS — BP 144/80 | HR 70 | Temp 98.4°F | Wt 224.0 lb

## 2015-06-12 DIAGNOSIS — I10 Essential (primary) hypertension: Secondary | ICD-10-CM

## 2015-06-12 DIAGNOSIS — C61 Malignant neoplasm of prostate: Secondary | ICD-10-CM | POA: Diagnosis not present

## 2015-06-12 DIAGNOSIS — R2230 Localized swelling, mass and lump, unspecified upper limb: Secondary | ICD-10-CM

## 2015-06-12 DIAGNOSIS — Z510 Encounter for antineoplastic radiation therapy: Secondary | ICD-10-CM | POA: Diagnosis not present

## 2015-06-12 DIAGNOSIS — M259 Joint disorder, unspecified: Secondary | ICD-10-CM | POA: Diagnosis not present

## 2015-06-12 DIAGNOSIS — R739 Hyperglycemia, unspecified: Secondary | ICD-10-CM

## 2015-06-12 DIAGNOSIS — Z1211 Encounter for screening for malignant neoplasm of colon: Secondary | ICD-10-CM | POA: Diagnosis not present

## 2015-06-12 DIAGNOSIS — E785 Hyperlipidemia, unspecified: Secondary | ICD-10-CM | POA: Diagnosis not present

## 2015-06-12 MED ORDER — ATORVASTATIN CALCIUM 20 MG PO TABS: 20.0000 mg | ORAL_TABLET | Freq: Every day | ORAL | Status: DC

## 2015-06-12 MED ORDER — LISINOPRIL 20 MG PO TABS: 20.0000 mg | ORAL_TABLET | Freq: Every day | ORAL | Status: DC

## 2015-06-12 NOTE — Patient Instructions (Addendum)
[facility] will call to schedule colonoscopy.  Increase lisinopril to 20mg . Have blood pressure rechecked at cardiology and if elevated >140/90 please let me know.   We will call you within a week about your referral to general surgery to remove likely lipoma on right shoulder as well as pedunculated lesion. If you do not hear within 2 weeks, give [place] a call.   Happy to see you for blood pressure recheck if needed or in 6 months or for physical in 1 year- refills provided for 1 year  Health Maintenance Due  Topic Date Due  . Hepatitis C Screening -opted for next visit 1951-01-29  . COLONOSCOPY - referred as above 09/12/1998  . PNA vac Low Risk Adult (2 of 2 - PPSV23)- Ashawnti Tangen to give before you leave 05/30/2015

## 2015-06-12 NOTE — Assessment & Plan Note (Signed)
S: well controlled on atorvastatin 20mg . No myalgias.  Lab Results  Component Value Date   CHOL 139 06/01/2015   HDL 46.80 06/01/2015   LDLCALC 79 06/01/2015   LDLDIRECT 168.8 05/13/2013   TRIG 64.0 06/01/2015   CHOLHDL 3 06/01/2015   A/P: continue current rx

## 2015-06-12 NOTE — Assessment & Plan Note (Signed)
S: poorly controlled. On lisinopril 10mg  . Home pressures 0000000 systolic usually BP Readings from Last 3 Encounters:  06/12/15 144/80  06/11/15 151/77  06/04/15 149/85  A/P:Continue current meds:  But increase to 20mg . Follow up cards on the 7th and we will titrate further if needed.

## 2015-06-12 NOTE — Progress Notes (Signed)
Devin Reddish, MD Phone: [phone]  Subjective:  Patient presents today for their annual physical. Chief complaint-noted.   See problem oriented charting- ROS- full  review of systems was completed and negative including No chest pain or shortness of breath. No headache or blurry vision.   The following were reviewed and entered/updated in epic: Past Medical History  Diagnosis Date  . HTN (hypertension)   . Hyperlipidemia   . Arthritis     OA AND PAIN LEFT HIP  . Prostate cancer ([place])   . CHB (complete heart block) ([name])     a. MDT dual chamber pacemaker  . Presence of permanent cardiac pacemaker    Patient Active Problem List   Diagnosis Date Noted  . Prostate cancer ([name]) 09/12/2014    Priority: High  . PPM-Medtronic 05/23/2010    Priority: High  . Atrioventricular block, complete ([name]) 03/28/2009    Priority: High  . Hyperlipidemia 03/28/2009    Priority: Medium  . Essential hypertension 03/16/2008    Priority: Medium  . Obese 07/04/2014  . S/P left THA, AA 07/03/2014  . Preop cardiovascular exam 06/07/2014   Past Surgical History  Procedure Laterality Date  . Pacemaker insertion  2000; 2012    MDT dual chamber pacemaker implanted 2000 with gen change 2012  . Adenoidectomy as a child    . Wisdom teeth extractions    . Total hip arthroplasty Left 07/03/2014    Procedure: LEFT TOTAL HIP ARTHROPLASTY ANTERIOR APPROACH;  Surgeon: Devin Pole, MD;  [facility];  Service: Orthopedics;  Laterality: Left;  . Prostate biopsy  09/05/14  . Robot assisted laparoscopic radical prostatectomy N/A 11/06/2014    Procedure: ROBOTIC ASSISTED LAPAROSCOPIC RADICAL PROSTATECTOMY LEVEL 2;  Surgeon: Devin Bring, MD;  [facility];  Service: Urology;  Laterality: N/A;  . Lymphadenectomy Bilateral 11/06/2014    Procedure: PELVIC LYMPHADENECTOMY;  Surgeon: Devin Bring, MD;  [facility];  Service: Urology;  Laterality: Bilateral;    Family History  Problem Relation  Age of Onset  . Stroke Mother     multiple. 93.   . Heart disease Mother     hx heart valve problem  . Prostate cancer Father     59 of prostate cancer    Medications- reviewed and updated Current Outpatient Prescriptions  Medication Sig Dispense Refill  . aspirin 81 MG tablet Take 81 mg by mouth daily.    Marland Kitchen atorvastatin (LIPITOR) 20 MG tablet Take 1 tablet (20 mg total) by mouth daily. 90 tablet 3  . lisinopril (PRINIVIL,ZESTRIL) 10 MG tablet  Only taking once in am Take 1 tablet (10 mg total) by mouth 2 (two) times daily. 180 tablet 1  . tadalafil (CIALIS) 5 MG tablet Take 5 mg by mouth daily as needed for erectile dysfunction.     No current facility-administered medications for this visit.    Allergies-reviewed and updated No Known Allergies  Social History   Social History  . Marital Status: Married    Spouse Name: N/A  . Number of Children: 4  . Years of Education: N/A   Occupational History  . attorney    Social History Main Topics  . Smoking status: Never Smoker   . Smokeless tobacco: Never Used  . Alcohol Use: 1.2 - 1.8 oz/week    2-3 Standard drinks or equivalent per week     Comment: 5 TO 7 DRINKS A WEEK stopped etoh use pending surgery   . Drug Use: No  . Sexual Activity:  Not Asked   Other Topics Concern  . None   Social History Narrative   Married to Dr. Regis Mercado 1979. 4 kids (1 lost to auto accident-3 surviving), no grandkids.       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 Oropharynx normal Neck: no thyromegaly CV: RRR no murmurs rubs or gallops Lungs: CTAB no crackles, wheeze, rhonchi Abdomen: soft/nontender/nondistended/normal bowel sounds. No rebound or guarding.  Ext: no edema Skin: warm, dry Neuro: grossly normal, moves all extremities,  PERRLA  Assessment/Plan:  64 y.o. male presenting for annual physical.  Health Maintenance counseling: 1. Anticipatory guidance: Patient counseled regarding regular dental exams, eye exams, wearing seatbelts.  2. Risk factor reduction:  Advised patient of need for regular exercise and diet rich and fruits and vegetables to reduce risk of heart attack and stroke.  3. Immunizations/screenings/ancillary studies Health Maintenance Due  Topic Date Due  . Hepatitis C Screening -next visit history human bite 06/17/49  . COLONOSCOPY -refer today 09/12/1998  . PNA vac Low Risk Adult (2 of 2 - PPSV23)- today 05/30/2015   4. Prostate cancer- follows with Dr. Alinda Mercado   5. Colon cancer screening - refer for colonoscopy 6. Skin cancer screening- waist up exam today  Immunization History  Administered Date(s) Administered  . Influenza Split 06/06/2011, 05/13/2012  . Influenza Whole 05/24/2008, 07/05/2009, 05/21/2010  . Influenza, High Dose Seasonal PF 05/05/2014, 05/11/2015  . Influenza,inj,Quad PF,36+ Mos 05/13/2013  . Pneumococcal Conjugate-13 05/29/2014  . Td 03/28/2009  . Zoster 06/10/2013  Pneumovax 23 was given today  Right shoulder mass S: noted above 6 months ago and has grown rather quickly O: mobile spongy mass right shoulder 8 x 5.5 cm. Much smaller pedunculated lesion behind right axilla as well A/P: refer to general surgery to remove both lipoma and pedunculated lesion.   Hyperlipidemia S: well controlled on atorvastatin 20mg . No myalgias.  Lab Results  Component Value Date   CHOL 139 06/01/2015   HDL 46.80 06/01/2015   LDLCALC 79 06/01/2015   LDLDIRECT 168.8 05/13/2013   TRIG 64.0 06/01/2015   CHOLHDL 3 06/01/2015   A/P: continue current rx    Essential hypertension S: poorly controlled. On lisinopril 10mg  . Home pressures 0000000 systolic usually BP Readings from Last 3 Encounters:  06/12/15 144/80  06/11/15 151/77  06/04/15 149/85  A/P:Continue current meds:  But  increase to 20mg . Follow up cards on the 7th and we will titrate further if needed.    1 year annual wellness visit. 6 month BP visit- but may simply monitor at home  Return precautions advised.   Orders Placed This Encounter  Procedures  . Ambulatory referral to Gastroenterology    Referral Priority:  Routine    Referral Type:  Consultation    Referral Reason:  Specialty Services Required    Number of Visits Requested:  1  . Ambulatory referral to General Surgery    Referral Priority:  Routine    Referral Type:  Surgical    Referral Reason:  Specialty Services Required    Requested Specialty:  General Surgery    Number of Visits Requested:  1   Meds ordered this encounter  Medications  . atorvastatin (LIPITOR) 20 MG tablet    Sig: Take 1 tablet (20 mg  total) by mouth daily.    Dispense:  90 tablet    Refill:  3  . lisinopril (PRINIVIL,ZESTRIL) 20 MG tablet    Sig: Take 1 tablet (20 mg total) by mouth daily.    Dispense:  90 tablet    Refill:  3

## 2015-06-12 NOTE — Progress Notes (Signed)
[facility] End of Treatment Note  Name:Devin Mercado  Date: 06/12/2015 [id number] DOB:[birth year]   Status:outpatient    CC: Garret Reddish, MD , Dr. [name]  REFERRING PHYSICIAN:  Dr. [name]   DIAGNOSIS: PSA recurrent/pathologic stage pT3a high risk adenocarcinoma prostate   INDICATION FOR TREATMENT: Curative   TREATMENT DATES: 04/25/2015 through 06/12/2015                          SITE/DOSE:  Prostate bed 6600 cGy in 33 sessions                          BEAMS/ENERGY: Dual ARC VMAT IMRT                   NARRATIVE:  Devin Mercado  tolerated treatment beautifully with no significant GU or GI toxicity during his course of therapy.  He had excellent bladder filling during his course of therapy.  Of note is that his primary care physician, Dr. Yong Channel, obtained a PSA last week prior to his annual physical in his PSA was elevated at 0.26, up from 0.04 on 02/16/2015.  This is of significant concern and most likely indicates distant disease progression outside of his radiation therapy field.  He will see [name] for a follow-up visit in January and I'm sure he will have a repeat PSA at that time.                       PLAN: I attempted to schedule him for a follow-up visit with me in mid to late December, but he or I will be out of town during this time.  Therefore, he will give me a call instead to give me an update on his progress recognizing that he currently has essentially no GU or GI toxicity.  Patient instructed to call if questions or worsening complaints in interim.

## 2015-06-19 NOTE — Progress Notes (Signed)
Electrophysiology Office Note Date: 06/20/2015  ID:  Devin Mercado, Devin Mercado 1951/06/12, MRN [id number]  PCP: Garret Reddish, MD Electrophysiologist: Lovena Le  CC: pacemaker follow-up  Devin Mercado is a 64 y.o. male is seen today for Dr Lovena Le.  He presents today for routine electrophysiology followup.  Since last being seen in our clinic, the patient reports doing reasonably well. He underwent prostatectomy and has completed treatment.  He was seen by his PCP recently who increased his Lisinopril for BP control.  He denies chest pain, palpitations, dyspnea, PND, orthopnea, nausea, vomiting, dizziness, syncope, edema, weight gain, or early satiety.  He remains active without chest pain, shortness of breath, or functional limitations.    Device History: MDT dual chamber PPM implanted 2000 for complete heart block with generator change 2012   Past Medical History  Diagnosis Date  . HTN (hypertension)   . Hyperlipidemia   . Arthritis     OA AND PAIN LEFT HIP  . Prostate cancer ([place])   . CHB (complete heart block) ([place])     a. MDT dual chamber pacemaker  . Presence of permanent cardiac pacemaker    Past Surgical History  Procedure Laterality Date  . Pacemaker insertion  2000; 2012    MDT dual chamber pacemaker implanted 2000 with gen change 2012  . Adenoidectomy as a child    . Wisdom teeth extractions    . Total hip arthroplasty Left 07/03/2014    Procedure: LEFT TOTAL HIP ARTHROPLASTY ANTERIOR APPROACH;  Surgeon: [name], MD;  [facility];  Service: Orthopedics;  Laterality: Left;  . Prostate biopsy  09/05/14  . Robot assisted laparoscopic radical prostatectomy N/A 11/06/2014    Procedure: ROBOTIC ASSISTED LAPAROSCOPIC RADICAL PROSTATECTOMY LEVEL 2;  Surgeon: [name], MD;  [facility];  Service: Urology;  Laterality: N/A;  . Lymphadenectomy Bilateral 11/06/2014    Procedure: PELVIC LYMPHADENECTOMY;  Surgeon: [name], MD;  [facility];   Service: Urology;  Laterality: Bilateral;    Current Outpatient Prescriptions  Medication Sig Dispense Refill  . aspirin 81 MG tablet Take 81 mg by mouth daily.    Marland Kitchen atorvastatin (LIPITOR) 20 MG tablet Take 1 tablet (20 mg total) by mouth daily. 90 tablet 3  . lisinopril (PRINIVIL,ZESTRIL) 20 MG tablet Take 1 tablet (20 mg total) by mouth daily. 90 tablet 3  . tadalafil (CIALIS) 5 MG tablet Take 5 mg by mouth daily as needed for erectile dysfunction.     No current facility-administered medications for this visit.    Allergies:   Review of patient's allergies indicates no known allergies.   Social History: Social History   Social History  . Marital Status: Married    Spouse Name: N/A  . Number of Children: 4  . Years of Education: N/A   Occupational History  . attorney    Social History Main Topics  . Smoking status: Never Smoker   . Smokeless tobacco: Never Used  . Alcohol Use: 1.2 - 1.8 oz/week    2-3 Standard drinks or equivalent per week     Comment: 5 TO 7 DRINKS A WEEK stopped etoh use pending surgery   . Drug Use: No  . Sexual Activity: Not on file   Other Topics Concern  . Not on file   Social History Narrative   Married to Dr. [name] 1979. 4 kids (1 lost to auto accident-3 surviving), no grandkids. 1 daughter married in 2016 and works as Forensic psychologist in [place].  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 Stroke Mother     multiple. 4.   . Heart disease Mother     hx heart valve problem  . Prostate cancer Father     2 of prostate cancer  . Heart attack Neg Hx   . Hypertension Neg Hx      Review of Systems: All other systems reviewed and are otherwise negative except as noted above.   Physical Exam: VS:  BP 130/80 mmHg  Pulse 78  Ht 6' (1.829 m)  Wt 224 lb (101.606 kg)  BMI 30.37 kg/m2  SpO2 95% , BMI Body mass index is 30.37 kg/(m^2).  GEN- The  patient is well appearing, alert and oriented x 3 today.   HEENT: normocephalic, atraumatic; sclera clear, conjunctiva pink; hearing intact; oropharynx clear; neck supple Lungs- Clear to ausculation bilaterally, normal work of breathing.  No wheezes, rales, rhonchi Heart- Regular rate and rhythm (paced)  GI- soft, non-tender, non-distended, bowel sounds present  Extremities- no clubbing, cyanosis, or edema; DP/PT pulses 2+ bilaterally MS- no significant deformity or atrophy Skin- warm and dry, no rash or lesion  Psych- euthymic mood, full affect Neuro- strength and sensation are intact  PPM Interrogation- reviewed in detail today,  See PACEART report  EKG:  EKG is not ordered today   Recent Labs: 06/01/2015: ALT 20; BUN 23; Creatinine, Ser 1.32; Hemoglobin 14.1; Platelets 184.0; Potassium 4.8; Sodium 141; TSH 2.60   Wt Readings from Last 3 Encounters:  06/20/15 224 lb (101.606 kg)  06/12/15 224 lb (101.606 kg)  06/11/15 224 lb (101.606 kg)     Assessment and Plan:  1.  Complete heart block Normal PPM function See Pace Art report No changes today  2.  HTN Stable No change required today He has follow up with PCP soon and would like to defer labs until that visit.    Current medicines are reviewed at length with the patient today.   The patient does not have concerns regarding his medicines.  The following changes were made today:  none  Labs/ tests ordered today include: none   Disposition:   Follow up with [facility] Dr Lovena Le in 1 year   Signed, Chanetta Marshall, NP 06/20/2015 8:26 AM  [facility] [address] [place] [address] [phone] (office) [phone] (fax)

## 2015-06-20 ENCOUNTER — Ambulatory Visit (INDEPENDENT_AMBULATORY_CARE_PROVIDER_SITE_OTHER): Payer: Medicare Other | Admitting: Nurse Practitioner

## 2015-06-20 ENCOUNTER — Encounter: Payer: Self-pay | Admitting: Nurse Practitioner

## 2015-06-20 ENCOUNTER — Encounter: Payer: Self-pay | Admitting: Internal Medicine

## 2015-06-20 VITALS — BP 130/80 | HR 78 | Ht 72.0 in | Wt 224.0 lb

## 2015-06-20 DIAGNOSIS — I10 Essential (primary) hypertension: Secondary | ICD-10-CM

## 2015-06-20 DIAGNOSIS — I442 Atrioventricular block, complete: Secondary | ICD-10-CM | POA: Diagnosis not present

## 2015-06-20 LAB — CUP PACEART INCLINIC DEVICE CHECK
Implantable Lead Implant Date: 20000731
Implantable Lead Location: 753859
Lead Channel Impedance Value: 599 Ohm
Lead Channel Pacing Threshold Amplitude: 1 V
Lead Channel Pacing Threshold Amplitude: 1 V
Lead Channel Pacing Threshold Pulse Width: 0.4 ms
Lead Channel Pacing Threshold Pulse Width: 0.4 ms
Lead Channel Setting Sensing Sensitivity: 2.8 mV
MDC IDC LEAD IMPLANT DT: 20000731
MDC IDC LEAD LOCATION: 753860
MDC IDC MSMT BATTERY VOLTAGE: 2.77 V
MDC IDC MSMT LEADCHNL RA IMPEDANCE VALUE: 365 Ohm
MDC IDC MSMT LEADCHNL RA SENSING INTR AMPL: 2.8 mV
MDC IDC SESS DTM: 20161207083958
MDC IDC SET LEADCHNL RA PACING AMPLITUDE: 2.25 V
MDC IDC SET LEADCHNL RV PACING AMPLITUDE: 2.5 V
MDC IDC SET LEADCHNL RV PACING PULSEWIDTH: 0.4 ms
MDC IDC STAT BRADY AP VP PERCENT: 0.1 %
MDC IDC STAT BRADY AP VS PERCENT: 0.1 %
MDC IDC STAT BRADY AS VP PERCENT: 92.2 %
MDC IDC STAT BRADY AS VS PERCENT: 0.1 %

## 2015-06-20 NOTE — Patient Instructions (Signed)
Medication Instructions:   CONTINUE ON THE SAME MEDICINES   If you need a refill on your cardiac medications before your next appointment, please call your pharmacy.  Labwork: NON TODAY    Testing/Procedures: NON TODAY    Follow-Up: Remote monitoring is used to monitor your Pacemaker of ICD from home. This monitoring reduces the number of office visits required to check your device to one time per year. It allows [place] to keep an eye on the functioning of your device to ensure it is working properly. You are scheduled for a device check from home on 3 /6 /17You may send your transmission at any time that day. If you have a wireless device, the transmission will be sent automatically. After your physician reviews your transmission, you will receive a postcard with your next transmission date.\   Your physician wants you to follow-up in: [place] will receive a reminder letter in the mail two months in advance. If you don't receive a letter, please call our office to schedule the follow-up appointment.       Any Other Special Instructions Will Be Listed Below (If Applicable).

## 2015-06-28 ENCOUNTER — Ambulatory Visit: Payer: Medicare Other | Admitting: Radiation Oncology

## 2015-06-28 ENCOUNTER — Inpatient Hospital Stay: Admission: RE | Admit: 2015-06-28 | Payer: Medicare Other | Source: Ambulatory Visit | Admitting: Radiation Oncology

## 2015-09-19 ENCOUNTER — Ambulatory Visit (INDEPENDENT_AMBULATORY_CARE_PROVIDER_SITE_OTHER): Payer: Medicare Other | Admitting: *Deleted

## 2015-09-19 DIAGNOSIS — I442 Atrioventricular block, complete: Secondary | ICD-10-CM | POA: Diagnosis not present

## 2015-09-21 NOTE — Progress Notes (Signed)
[facility] Summary Report / Loop Recorder 

## 2015-09-22 LAB — CUP PACEART REMOTE DEVICE CHECK
Brady Statistic AP VP Percent: 5 %
Brady Statistic AP VS Percent: 0 %
Brady Statistic AS VP Percent: 95 %
Brady Statistic AS VS Percent: 0 %
Date Time Interrogation Session: 20170308120337
Implantable Lead Implant Date: 20000731
Lead Channel Setting Pacing Amplitude: 2.5 V
Lead Channel Setting Pacing Pulse Width: 0.4 ms
Lead Channel Setting Sensing Sensitivity: 2.8 mV
MDC IDC LEAD IMPLANT DT: 20000731
MDC IDC LEAD LOCATION: 753859
MDC IDC LEAD LOCATION: 753860
MDC IDC MSMT BATTERY IMPEDANCE: 302 Ohm
MDC IDC MSMT BATTERY REMAINING LONGEVITY: 101 mo
MDC IDC MSMT BATTERY VOLTAGE: 2.78 V
MDC IDC MSMT LEADCHNL RA IMPEDANCE VALUE: 368 Ohm
MDC IDC MSMT LEADCHNL RV IMPEDANCE VALUE: 538 Ohm
MDC IDC SET LEADCHNL RA PACING AMPLITUDE: 2.5 V

## 2015-09-22 NOTE — Progress Notes (Signed)
Normal remote reviewed. NSVT chronic for patient  Next [facility] 12/19/15

## 2015-09-26 ENCOUNTER — Encounter: Payer: Self-pay | Admitting: Cardiology

## 2015-12-24 LAB — BASIC METABOLIC PANEL
CREATININE: 1.3 mg/dL (ref <=1.3)
Glucose: 125 mg/dL
SODIUM: 136 mmol/L — AB (ref 137–147)

## 2015-12-24 LAB — HEPATIC FUNCTION PANEL
ALT: 65 U/L — AB (ref 10–40)
AST: 39 U/L (ref 14–40)
Alkaline Phosphatase: 51 U/L (ref 25–125)
BILIRUBIN, TOTAL: 0.8 mg/dL

## 2015-12-24 LAB — CBC AND DIFFERENTIAL
HCT: 39 % — AB (ref 41–53)
HEMOGLOBIN: 13.4 g/dL — AB (ref 13.5–17.5)
PLATELETS: 172 10*3/uL (ref 150–399)
WBC: 4.4 10^3/mL

## 2015-12-24 LAB — PSA: PSA: 0.02

## 2016-01-04 ENCOUNTER — Telehealth: Payer: Self-pay | Admitting: Family Medicine

## 2016-01-04 NOTE — Telephone Encounter (Signed)
Has had episodes of epigastric pain at night usually after a meal- sparingly. He would like to make some dietary changes and follow up if continued issues. Did not see gallstones on 09/2014 CT scan but he thinks it is likely his gallbladder. Offered office visit, ultrasound- declined for now

## 2016-01-13 ENCOUNTER — Encounter: Payer: Self-pay | Admitting: Family Medicine

## 2016-01-13 DIAGNOSIS — R1011 Right upper quadrant pain: Secondary | ICD-10-CM

## 2016-01-21 ENCOUNTER — Encounter: Payer: Self-pay | Admitting: Family Medicine

## 2016-02-04 ENCOUNTER — Ambulatory Visit
Admission: RE | Admit: 2016-02-04 | Discharge: 2016-02-04 | Disposition: A | Discharge status: Home / Self Care | Payer: Medicare Other | Source: Ambulatory Visit | Attending: Family Medicine | Admitting: Family Medicine

## 2016-02-04 DIAGNOSIS — R1011 Right upper quadrant pain: Secondary | ICD-10-CM

## 2016-02-05 ENCOUNTER — Other Ambulatory Visit: Payer: Self-pay

## 2016-02-05 DIAGNOSIS — K8020 Calculus of gallbladder without cholecystitis without obstruction: Secondary | ICD-10-CM

## 2016-05-23 ENCOUNTER — Ambulatory Visit (INDEPENDENT_AMBULATORY_CARE_PROVIDER_SITE_OTHER): Payer: Medicare Other

## 2016-05-23 DIAGNOSIS — Z23 Encounter for immunization: Secondary | ICD-10-CM | POA: Diagnosis not present

## 2016-05-29 ENCOUNTER — Ambulatory Visit: Payer: Medicare Other

## 2016-06-12 ENCOUNTER — Encounter: Payer: Medicare Other | Admitting: Family Medicine

## 2016-06-12 ENCOUNTER — Other Ambulatory Visit: Payer: Medicare Other

## 2016-06-26 ENCOUNTER — Encounter: Payer: Self-pay | Admitting: Internal Medicine

## 2016-07-10 ENCOUNTER — Encounter: Payer: Self-pay | Admitting: Internal Medicine

## 2016-07-10 ENCOUNTER — Ambulatory Visit (INDEPENDENT_AMBULATORY_CARE_PROVIDER_SITE_OTHER): Payer: Medicare Other | Admitting: Internal Medicine

## 2016-07-10 VITALS — BP 126/78 | HR 67 | Ht 68.0 in | Wt 228.2 lb

## 2016-07-10 DIAGNOSIS — Z950 Presence of cardiac pacemaker: Secondary | ICD-10-CM | POA: Diagnosis not present

## 2016-07-10 DIAGNOSIS — I10 Essential (primary) hypertension: Secondary | ICD-10-CM | POA: Diagnosis not present

## 2016-07-10 DIAGNOSIS — I442 Atrioventricular block, complete: Secondary | ICD-10-CM | POA: Diagnosis not present

## 2016-07-10 LAB — CUP PACEART INCLINIC DEVICE CHECK
Battery Voltage: 2.78 V
Brady Statistic AP VS Percent: 0 %
Brady Statistic AS VP Percent: 93 %
Implantable Lead Implant Date: 20000731
Implantable Lead Implant Date: 20000731
Implantable Lead Model: 5092
Lead Channel Impedance Value: 360 Ohm
Lead Channel Impedance Value: 545 Ohm
Lead Channel Pacing Threshold Amplitude: 1 V
Lead Channel Pacing Threshold Pulse Width: 0.4 ms
Lead Channel Pacing Threshold Pulse Width: 0.4 ms
Lead Channel Setting Pacing Amplitude: 2.5 V
Lead Channel Setting Sensing Sensitivity: 2.8 mV
MDC IDC LEAD LOCATION: 753859
MDC IDC LEAD LOCATION: 753860
MDC IDC MSMT BATTERY IMPEDANCE: 326 Ohm
MDC IDC MSMT BATTERY REMAINING LONGEVITY: 98 mo
MDC IDC MSMT LEADCHNL RA PACING THRESHOLD AMPLITUDE: 1 V
MDC IDC MSMT LEADCHNL RA PACING THRESHOLD AMPLITUDE: 1.125 V
MDC IDC MSMT LEADCHNL RA PACING THRESHOLD PULSEWIDTH: 0.4 ms
MDC IDC MSMT LEADCHNL RA SENSING INTR AMPL: 4 mV
MDC IDC MSMT LEADCHNL RV PACING THRESHOLD AMPLITUDE: 1.25 V
MDC IDC MSMT LEADCHNL RV PACING THRESHOLD PULSEWIDTH: 0.4 ms
MDC IDC PG IMPLANT DT: 20121026
MDC IDC SESS DTM: 20171228095242
MDC IDC SET LEADCHNL RA PACING AMPLITUDE: 2.25 V
MDC IDC SET LEADCHNL RV PACING PULSEWIDTH: 0.4 ms
MDC IDC STAT BRADY AP VP PERCENT: 7 %
MDC IDC STAT BRADY AS VS PERCENT: 0 %

## 2016-07-10 NOTE — Patient Instructions (Addendum)
Medication Instructions:  Your physician recommends that you continue on your current medications as directed. Please refer to the Current Medication list given to you today.   Labwork: None Ordered   Testing/Procedures: None Ordered    Follow-Up: Your physician wants you to follow-up in: 2 years with Dr. Lovena Le. You will receive a reminder letter in the mail two months in advance. If you don't receive a letter, please call our office to schedule the follow-up appointment.   Remote monitoring is used to monitor your Pacemaker from home. This monitoring reduces the number of office visits required to check your device to one time per year. It allows [place] to keep an eye on the functioning of your device to ensure it is working properly. You are scheduled for a device check from home on 10/09/16. You may send your transmission at any time that day. If you have a wireless device, the transmission will be sent automatically. After your physician reviews your transmission, you will receive a postcard with your next transmission date.    Any Other Special Instructions Will Be Listed Below (If Applicable).     If you need a refill on your cardiac medications before your next appointment, please call your pharmacy.

## 2016-07-10 NOTE — Progress Notes (Signed)
HPI Devin Mercado returns today for follow-up. He is a 65 year old man with a history of hypertension, dyslipidemia, and complete heart block. He is status post permanent pacemaker incision. He feels well. He is exercising 3 times a week. No syncope and no chest pain or sob. He has undergone hip replacement surgery in the interim and has no pain.  No Known Allergies   Current Outpatient Prescriptions  Medication Sig Dispense Refill  . aspirin 81 MG tablet Take 81 mg by mouth daily.    Marland Kitchen atorvastatin (LIPITOR) 20 MG tablet Take 1 tablet (20 mg total) by mouth daily. 90 tablet 3  . hydrochlorothiazide (HYDRODIURIL) 12.5 MG tablet Take 1 tablet by mouth daily.    Marland Kitchen lisinopril (PRINIVIL,ZESTRIL) 20 MG tablet Take 1 tablet (20 mg total) by mouth daily. 90 tablet 3   No current facility-administered medications for this visit.      Past Medical History:  Diagnosis Date  . Arthritis    OA AND PAIN LEFT HIP  . CHB (complete heart block) ([place])    a. MDT dual chamber pacemaker  . HTN (hypertension)   . Hyperlipidemia   . Presence of permanent cardiac pacemaker   . Prostate cancer ([place])     ROS:   All systems reviewed and negative except as noted in the HPI.   Past Surgical History:  Procedure Laterality Date  . ADENOIDECTOMY AS A CHILD    . LYMPHADENECTOMY Bilateral 11/06/2014   Procedure: PELVIC LYMPHADENECTOMY;  Surgeon: [name], MD;  [facility];  Service: Urology;  Laterality: Bilateral;  . PACEMAKER INSERTION  2000; 2012   MDT dual chamber pacemaker implanted 2000 with gen change 2012  . PROSTATE BIOPSY  09/05/14  . ROBOT ASSISTED LAPAROSCOPIC RADICAL PROSTATECTOMY N/A 11/06/2014   Procedure: ROBOTIC ASSISTED LAPAROSCOPIC RADICAL PROSTATECTOMY LEVEL 2;  Surgeon: [name], MD;  [facility];  Service: Urology;  Laterality: N/A;  . TOTAL HIP ARTHROPLASTY Left 07/03/2014   Procedure: LEFT TOTAL HIP ARTHROPLASTY ANTERIOR APPROACH;  Surgeon: [name],  MD;  [facility];  Service: Orthopedics;  Laterality: Left;  . WISDOM TEETH EXTRACTIONS       Family History  Problem Relation Age of Onset  . Stroke Mother     multiple. 65.   . Heart disease Mother     hx heart valve problem  . Prostate cancer Father     66 of prostate cancer  . Heart attack Neg Hx   . Hypertension Neg Hx      Social History   Social History  . Marital status: Married    Spouse name: N/A  . Number of children: 4  . Years of education: N/A   Occupational History  . attorney    Social History Main Topics  . Smoking status: Never Smoker  . Smokeless tobacco: Never Used  . Alcohol use 1.2 - 1.8 oz/week    2 - 3 Standard drinks or equivalent per week     Comment: 5 TO 7 DRINKS A WEEK stopped etoh use pending surgery   . Drug use: No  . Sexual activity: Not on file   Other Topics Concern  . Not on file   Social History Narrative   Married to Dr. [name] 1979. 4 kids (1 lost to auto accident-3 surviving), no grandkids. 1 daughter married in 2016 and works as Forensic psychologist in [place].       Works as an Forensic psychologist  Hobbies: [place] vacation   Works in [employer]  BP 126/78   Pulse 67   Ht 5\' 8"  (1.727 m)   Wt 228 lb 3.2 oz (103.5 kg)   SpO2 98%   BMI 34.70 kg/m   Physical Exam:  Well appearing 65 yo man, NAD HEENT: Unremarkable Neck:  6 cm JVD, no thyromegally Lymphatics:  No adenopathy Back:  no CVA tenderness Lungs:  Clear with no wheezes HEART:  Regular rate rhythm, no murmurs, no rubs, no clicks Abd:  soft, positive bowel sounds, no organomegally, no rebound, no guarding Ext:  2 plus pulses, no edema, no cyanosis, no clubbing Skin:  No rashes no nodules Neuro:  CN II through XII intact, motor grossly intact   DEVICE  Normal device function.  See PaceArt for details.   Assess/Plan: 1. CHB - he is asymptomatic, s/p PPM insertion 2. HTN - his blood pressure is controlled. No change in meds. 3. PPM - his medtronic  DDD PM is working normally. Will recheck in several months.  Devin Mercado.D.

## 2016-07-21 ENCOUNTER — Encounter: Payer: Self-pay | Admitting: Family Medicine

## 2016-07-25 ENCOUNTER — Other Ambulatory Visit: Payer: Medicare Other

## 2016-07-31 ENCOUNTER — Encounter: Payer: Medicare Other | Admitting: Family Medicine

## 2016-08-18 ENCOUNTER — Other Ambulatory Visit: Payer: Self-pay | Admitting: Family Medicine

## 2016-10-09 ENCOUNTER — Encounter: Payer: Medicare Other | Admitting: *Deleted

## 2016-10-09 ENCOUNTER — Telehealth: Payer: Self-pay | Admitting: Cardiology

## 2016-10-09 NOTE — Telephone Encounter (Signed)
LMOVM reminding pt to send remote transmission.   

## 2016-10-10 ENCOUNTER — Encounter: Payer: Self-pay | Admitting: Cardiology

## 2016-10-27 LAB — BASIC METABOLIC PANEL
BUN: 24 mg/dL — AB (ref 4–21)
CREATININE: 1.2 mg/dL (ref 0.6–1.3)
POTASSIUM: 4.4 mmol/L (ref 3.4–5.3)
SODIUM: 138 mmol/L (ref 137–147)

## 2016-10-27 LAB — PSA: PSA: <0.01

## 2016-10-27 LAB — CBC AND DIFFERENTIAL
HCT: 39 % — AB (ref 41–53)
Hemoglobin: 13.4 g/dL — AB (ref 13.5–17.5)
Neutrophils Absolute: 3 /uL
Platelets: 164 10*3/uL (ref 150–399)
WBC: 5 10^3/mL

## 2016-10-27 LAB — HEPATIC FUNCTION PANEL
ALK PHOS: 58 U/L (ref 25–125)
ALT: 20 U/L (ref 10–40)
AST: 19 U/L (ref 14–40)
Bilirubin, Total: 0.6 mg/dL

## 2016-11-06 IMAGING — DX DG PORTABLE PELVIS
1 series · 1 of 1 positions shown · non-contrast
Comparison: None.

CLINICAL DATA: Postop left hip replacement

EXAM:
PORTABLE PELVIS 1-2 VIEWS

[pelvis ap]
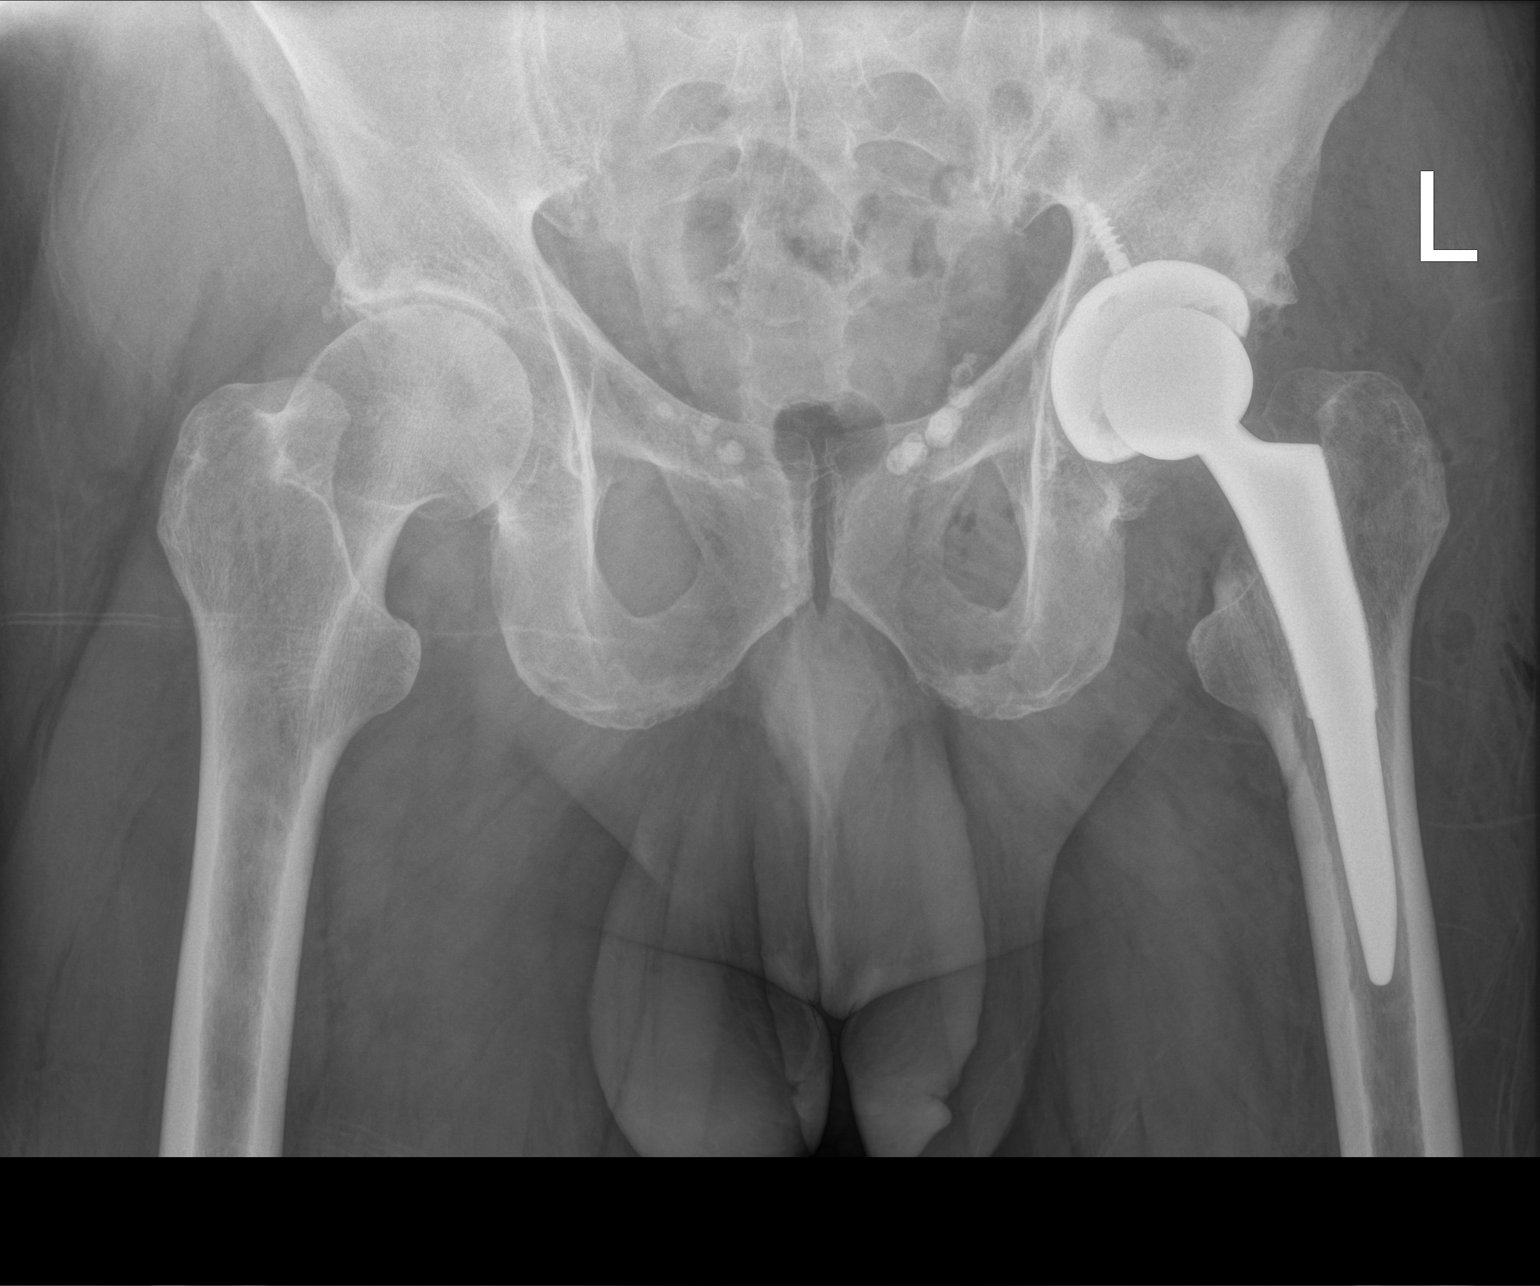

[1 of 1 positions shown; findings below may reference images not displayed]

FINDINGS: Left total hip arthroplasty without failure or complication. No
fracture or dislocation. Postsurgical changes in the surrounding
soft tissues.

Mild osteoarthritis of the right hip. No lytic or sclerotic osseous
lesion.
IMPRESSION: Total left hip arthroplasty.

## 2016-11-06 IMAGING — DX DG HIP 1V PORT*L*
1 series · 1 of 1 positions shown · non-contrast
Comparison: None.

CLINICAL DATA: Left hip replacement

EXAM:
PORTABLE LEFT HIP - 1 VIEW

[hip x-table]
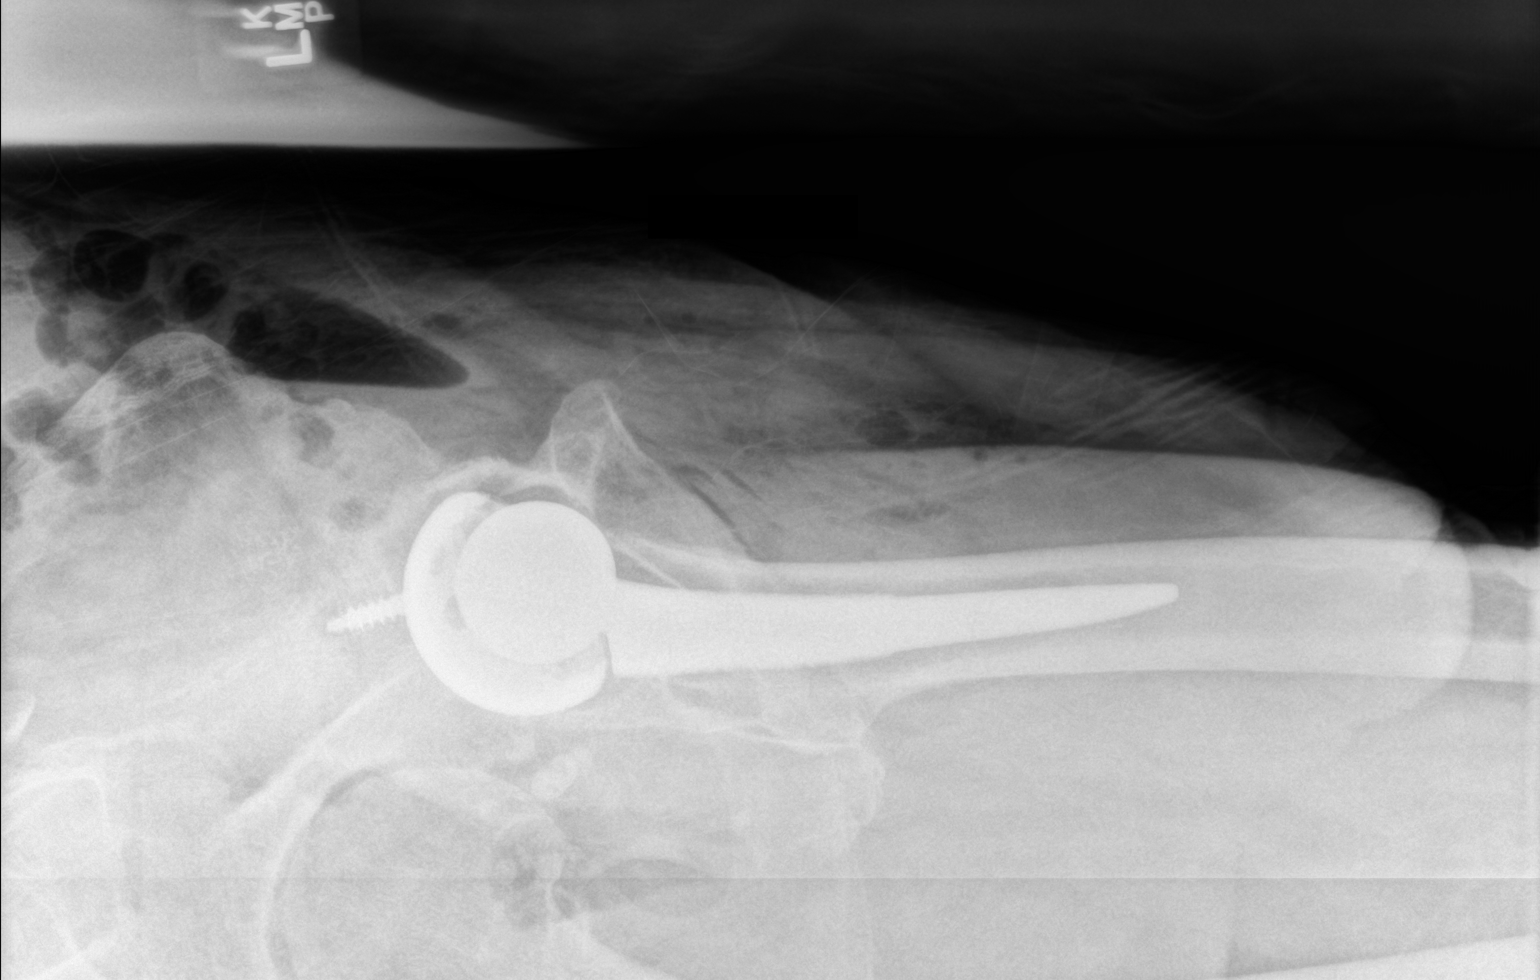

[1 of 1 positions shown; findings below may reference images not displayed]

FINDINGS: Total left hip arthroplasty. No fracture or dislocation. No hardware
failure or complication. Postsurgical changes in the surrounding
soft tissues.
IMPRESSION: Total left hip arthroplasty.

## 2016-11-18 ENCOUNTER — Encounter: Payer: Self-pay | Admitting: Family Medicine

## 2017-01-09 IMAGING — US US TRANSRECTAL
1 series · 14 of 16 positions shown · non-contrast
Comparison: none

[Series 1: us transrectal · 0.09mm/px · 14 of 19 slices shown]
[im 1/19]
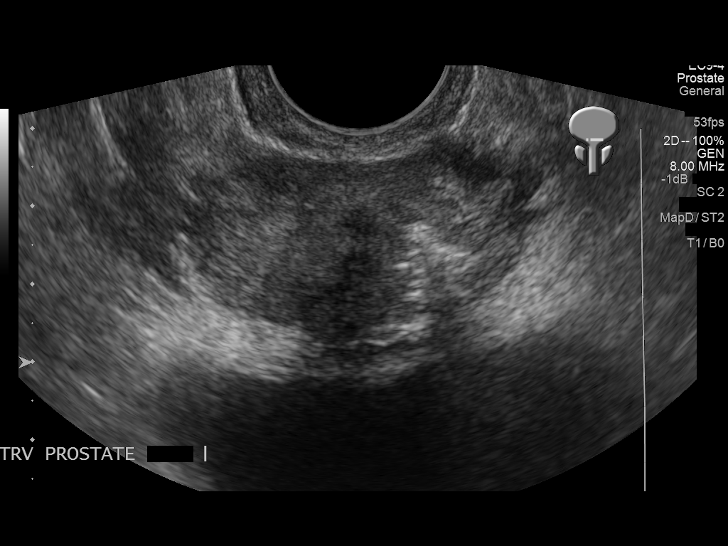
[im 2/19]
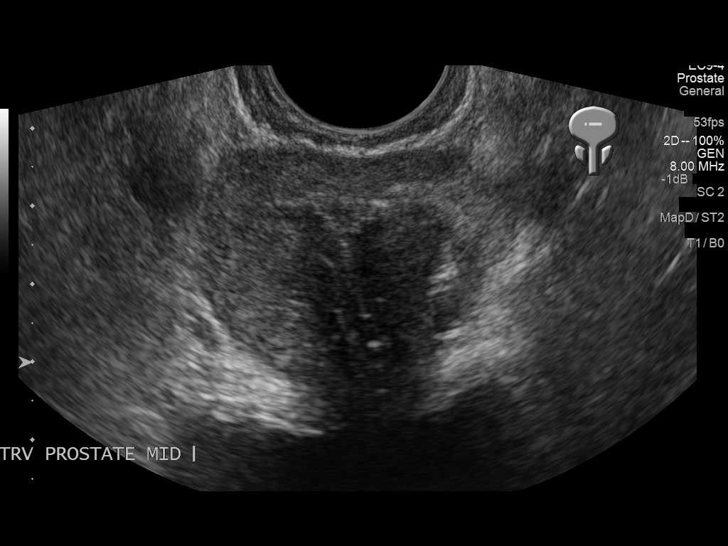
[im 3/19]
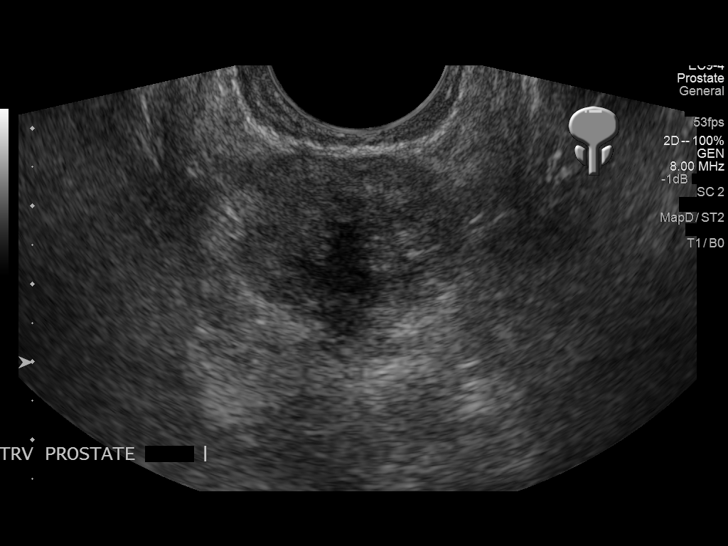
[im 5/19]
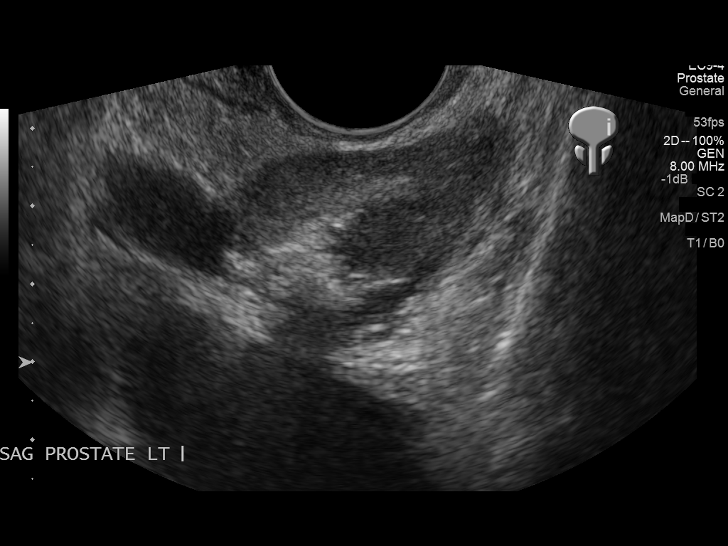
[im 7/19]
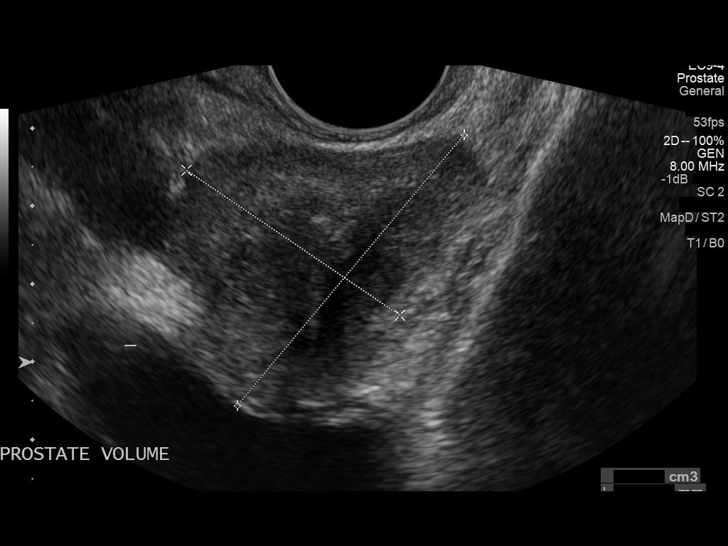
[im 8/19]
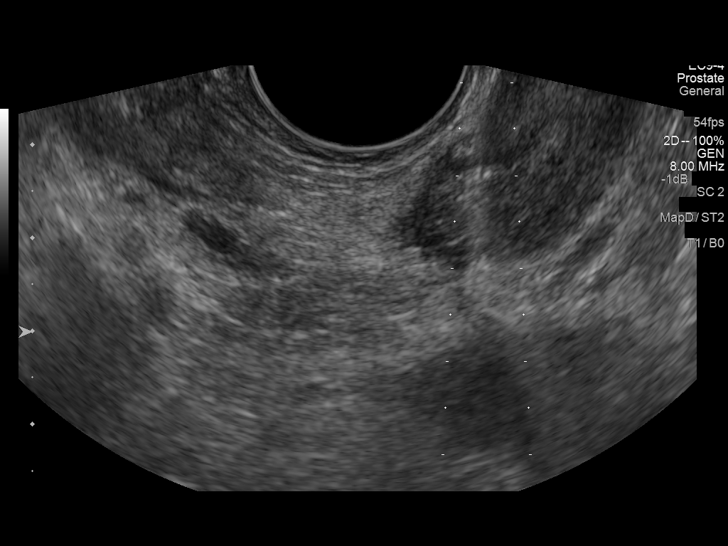
[im 9/19]
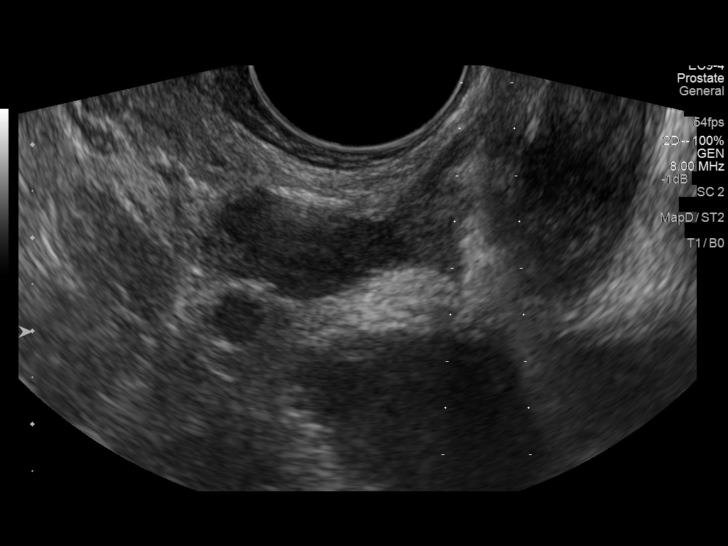
[im 10/19]
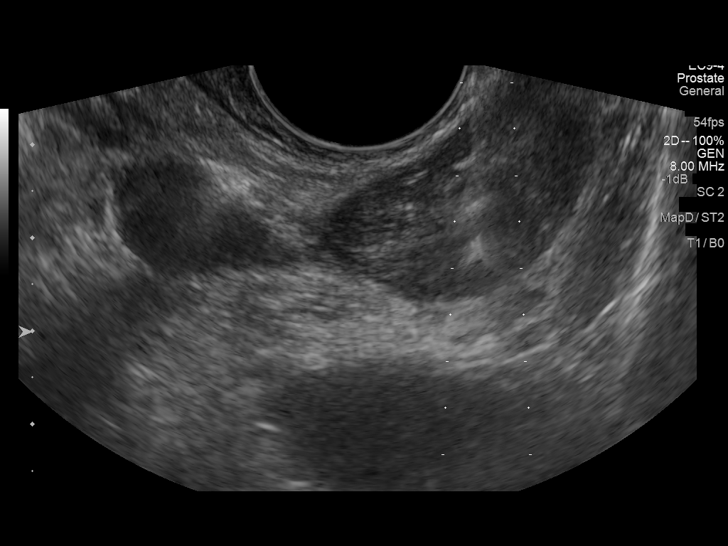
[im 11/19]
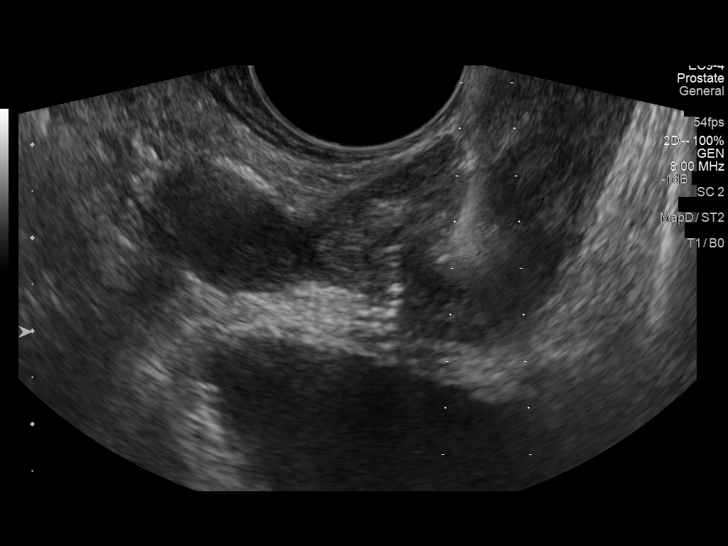
[im 13/19]
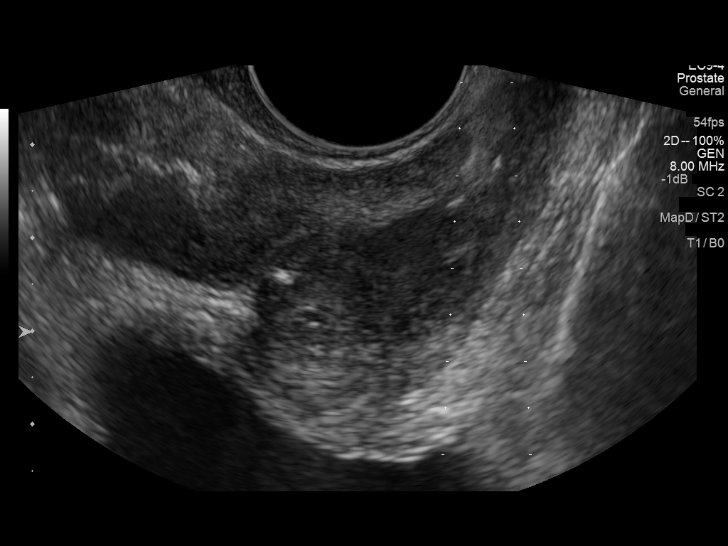
[im 15/19]
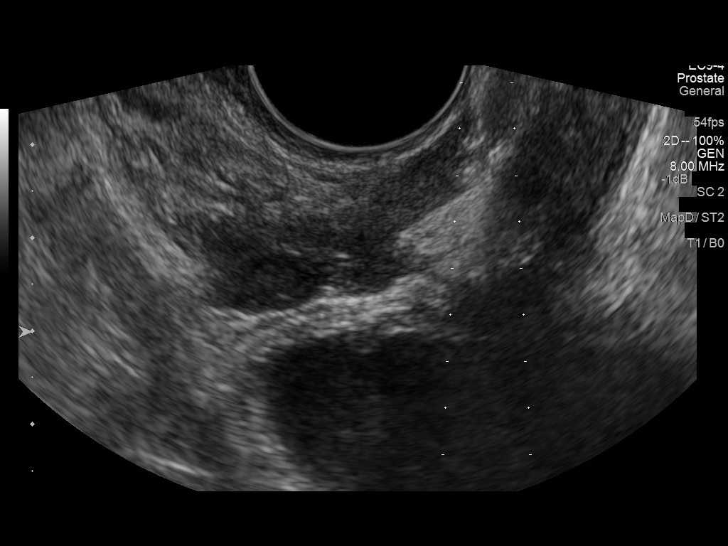
[im 16/19]
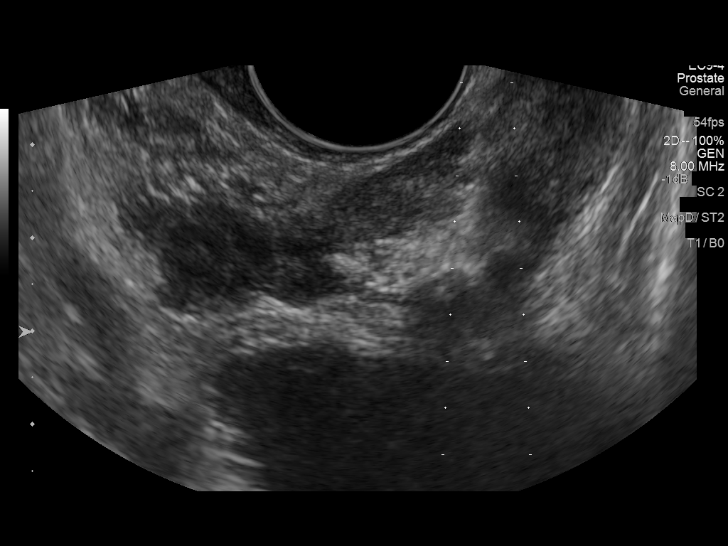
[im 17/19]
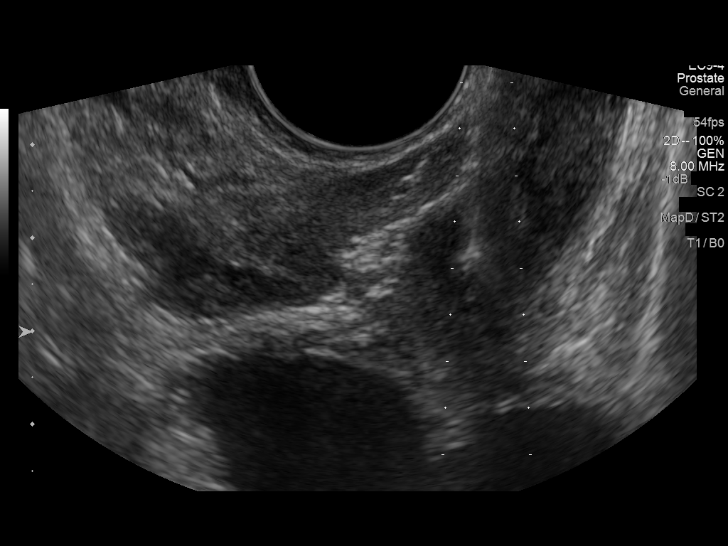
[im 19/19]
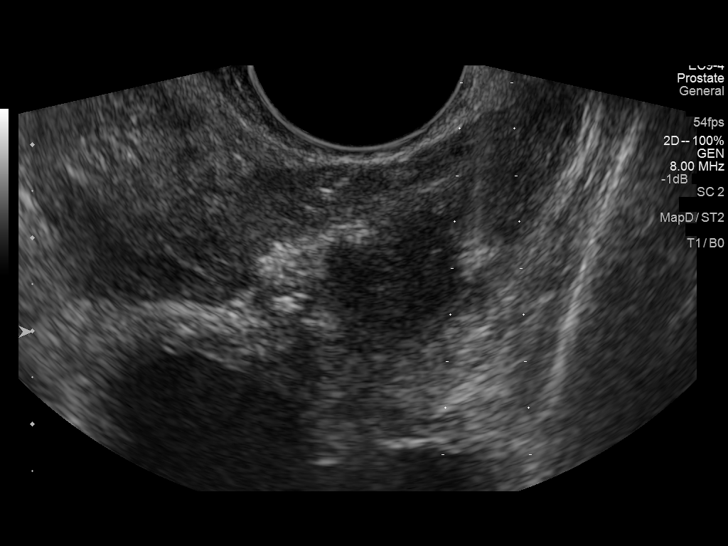

[14 of 16 positions shown; findings below may reference images not displayed]

Canned report from images found in remote index.

Refer to host system for actual result text.

## 2017-01-26 LAB — CBC AND DIFFERENTIAL
HEMATOCRIT: 40 — AB (ref 41–53)
Hemoglobin: 13.4 — AB (ref 13.5–17.5)
PLATELETS: 183 (ref 150–399)
WBC: 4.4

## 2017-01-26 LAB — HEPATIC FUNCTION PANEL
ALT: 20 (ref 10–40)
AST: 20 (ref 14–40)
Alkaline Phosphatase: 48 (ref 25–125)
Bilirubin, Total: 0.3

## 2017-01-26 LAB — BASIC METABOLIC PANEL
BUN: 24 — AB (ref 4–21)
Creatinine: 1.1 (ref 0.6–1.3)
GLUCOSE: 124
Potassium: 4.4 (ref 3.4–5.3)
SODIUM: 138 (ref 137–147)

## 2017-04-24 ENCOUNTER — Ambulatory Visit (INDEPENDENT_AMBULATORY_CARE_PROVIDER_SITE_OTHER): Payer: Medicare Other | Admitting: *Deleted

## 2017-04-24 DIAGNOSIS — Z23 Encounter for immunization: Secondary | ICD-10-CM

## 2017-05-15 ENCOUNTER — Encounter: Payer: Self-pay | Admitting: Physical Therapy

## 2017-05-15 ENCOUNTER — Ambulatory Visit (INDEPENDENT_AMBULATORY_CARE_PROVIDER_SITE_OTHER): Payer: Medicare Other | Admitting: Family Medicine

## 2017-05-15 ENCOUNTER — Encounter: Payer: Self-pay | Admitting: Family Medicine

## 2017-05-15 VITALS — BP 128/82 | HR 75 | Temp 98.1°F | Ht 68.0 in | Wt 233.6 lb

## 2017-05-15 DIAGNOSIS — Z0000 Encounter for general adult medical examination without abnormal findings: Secondary | ICD-10-CM | POA: Diagnosis not present

## 2017-05-15 DIAGNOSIS — E785 Hyperlipidemia, unspecified: Secondary | ICD-10-CM

## 2017-05-15 DIAGNOSIS — Z23 Encounter for immunization: Secondary | ICD-10-CM | POA: Diagnosis not present

## 2017-05-15 DIAGNOSIS — I10 Essential (primary) hypertension: Secondary | ICD-10-CM

## 2017-05-15 DIAGNOSIS — R739 Hyperglycemia, unspecified: Secondary | ICD-10-CM | POA: Diagnosis not present

## 2017-05-15 NOTE — Assessment & Plan Note (Signed)
S: controlled poorly on hctz 12.5 mg and lisinopril 20mg .  BP Readings from Last 3 Encounters:  05/15/17 128/82  07/10/16 126/78  06/20/15 130/80  A/P: We discussed blood pressure goal of <140/90. Continue current meds:  Controlled on repeat

## 2017-05-15 NOTE — Assessment & Plan Note (Signed)
S:  controlled on last check on atorvastatin 20mg . No myalgias.  Lab Results  Component Value Date   CHOL 139 06/01/2015   HDL 46.80 06/01/2015   LDLCALC 79 06/01/2015   LDLDIRECT 168.8 05/13/2013   TRIG 64.0 06/01/2015   CHOLHDL 3 06/01/2015   A/P: continue current medications. He is going to ask [facility] with q3 month labs to check lipid levels

## 2017-05-15 NOTE — Patient Instructions (Addendum)
You set a goal of losing 10 lbs in the next 6-12 months. I think this would be a great start  Please see if [facility] can check  1. Lipid panel under hyperlipidemia E78.5 2. Hemoglobin a1c under hyperglycemia R 73.9  You do have an increased risk of diabetes based on blood sugars that have been drawn making the weight loss all the more important. Happy to see you in 6 months to check in and consider a1c if you would like.

## 2017-05-15 NOTE — Progress Notes (Signed)
Phone: [phone]  Subjective:  Patient presents today for their annual physical. Chief complaint-noted.   See problem oriented charting- ROS- full  review of systems was completed and negative except for: fatigue, incontinence. No palpitations. No chest pain or shortness of breath. No headache or blurry vision.   The following were reviewed and entered/updated in epic: Past Medical History:  Diagnosis Date  . Arthritis    OA AND PAIN LEFT HIP  . CHB (complete heart block) ([place])    a. MDT dual chamber pacemaker  . HTN (hypertension)   . Hyperlipidemia   . Presence of permanent cardiac pacemaker   . Prostate cancer [facility])    Patient Active Problem List   Diagnosis Date Noted  . Prostate cancer ([place]) 09/12/2014    Priority: High  . PPM-Medtronic 05/23/2010    Priority: High  . Atrioventricular block, complete ([place]) 03/28/2009    Priority: High  . Hyperglycemia 06/12/2015    Priority: Medium  . Hyperlipidemia 03/28/2009    Priority: Medium  . Essential hypertension 03/16/2008    Priority: Medium  . Obese 07/04/2014    Priority: Low  . S/P left THA, AA 07/03/2014    Priority: Low  . Preop cardiovascular exam 06/07/2014   Past Surgical History:  Procedure Laterality Date  . ADENOIDECTOMY AS A CHILD    . LYMPHADENECTOMY Bilateral 11/06/2014   Procedure: PELVIC LYMPHADENECTOMY;  Surgeon: [name], MD;  Location: [facility];  Service: Urology;  Laterality: Bilateral;  . PACEMAKER INSERTION  2000; 2012   MDT dual chamber pacemaker implanted 2000 with gen change 2012  . PROSTATE BIOPSY  09/05/14  . ROBOT ASSISTED LAPAROSCOPIC RADICAL PROSTATECTOMY N/A 11/06/2014   Procedure: ROBOTIC ASSISTED LAPAROSCOPIC RADICAL PROSTATECTOMY LEVEL 2;  Surgeon: [name], MD;  [facility];  Service: Urology;  Laterality: N/A;  . TOTAL HIP ARTHROPLASTY Left 07/03/2014   Procedure: LEFT TOTAL HIP ARTHROPLASTY ANTERIOR APPROACH;  Surgeon: [name], MD;  [facility];   Service: Orthopedics;  Laterality: Left;  . WISDOM TEETH EXTRACTIONS      Family History  Problem Relation Age of Onset  . Stroke Mother        multiple. 18.   . Heart disease Mother        hx heart valve problem  . Prostate cancer Father        55 of prostate cancer  . Heart attack Neg Hx   . Hypertension Neg Hx     Medications- reviewed and updated Current Outpatient Prescriptions  Medication Sig Dispense Refill  . aspirin 81 MG tablet Take 81 mg by mouth daily.    Marland Kitchen atorvastatin (LIPITOR) 20 MG tablet TAKE ONE TABLET BY MOUTH ONCE DAILY 90 tablet 3  . hydrochlorothiazide (HYDRODIURIL) 12.5 MG tablet Take 1 tablet by mouth daily.    Marland Kitchen lisinopril (PRINIVIL,ZESTRIL) 20 MG tablet TAKE ONE TABLET BY MOUTH ONCE DAILY 90 tablet 3   No current facility-administered medications for this visit.     Allergies-reviewed and updated No Known Allergies  Social History   Social History  . Marital status: Married    Spouse name: N/A  . Number of children: 4  . Years of education: N/A   Occupational History  . attorney    Social History Main Topics  . Smoking status: Never Smoker  . Smokeless tobacco: Never Used  . Alcohol use 1.2 - 1.8 oz/week    2 - 3 Standard drinks or equivalent per week     Comment:  5 TO 7 DRINKS A WEEK stopped etoh use pending surgery   . Drug use: No  . Sexual activity: Not Asked   Other Topics Concern  . None   Social History Narrative   Married to Dr. [name] 1979. 4 kids (1 lost to auto accident-3 surviving), no grandkids. 1 daughter married in 2016 and works as Forensic psychologist in [place].       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 Oropharynx normal Neck: no thyromegaly CV: RRR no murmurs rubs or gallops Lungs:  CTAB no crackles, wheeze, rhonchi Abdomen: soft/nontender/nondistended/normal bowel sounds. No rebound or guarding. overweight Ext: no edema Skin: warm, dry Neuro: grossly normal, moves all extremities, PERRLA  No rectal required with prostatectomy  Assessment/Plan:  66 y.o. male presenting for annual physical.  Health Maintenance counseling: 1. Anticipatory guidance: Patient counseled regarding regular dental exams -q6 months, eye exams -yearly or close, wearing seatbelts.  2. Risk factor reduction:  Advised patient of need for regular exercise and diet rich and fruits and vegetables to reduce risk of heart attack and stroke. Exercise- was doing 3x a week but has gone down lately with work. Diet-improving diet as has gained weight.  Wt Readings from Last 3 Encounters:  05/15/17 233 lb 9.6 oz (106 kg)  07/10/16 228 lb 3.2 oz (103.5 kg)  06/20/15 224 lb (101.6 kg)  3. Immunizations/screenings/ancillary studies- discussed shingrix at pharmacy. Had HCV screen at [facility] History  Administered Date(s) Administered  . Influenza Split 06/06/2011, 05/13/2012  . Influenza Whole 05/24/2008, 07/05/2009, 05/21/2010  . Influenza, High Dose Seasonal PF 05/05/2014, 05/11/2015, 05/23/2016, 04/24/2017  . Influenza,inj,Quad PF,6+ Mos 05/13/2013  . Pneumococcal Conjugate-13 05/29/2014  . Pneumococcal Polysaccharide-23 05/15/2017  . Td 03/28/2009  . Zoster 06/10/2013  4. Prostate cancer screening- following with [facility]- s/p prostatectomy but positive margins. Ad radiation. Also treated with lupron. Clinical trial with xtandi- now finished. psa has remained down despite coming lupron off in august. Lingering fatigue and hot flashes, left sided chest tenderness- negative mammogram.  5. Colon cancer screening - referred for colonoscopy last CPE but ended up having to go through treatment for prostate cancer. We will move forward with cologuard this year.  6. Skin cancer screening- wife who is  physician med/peds watches skin. advised regular sunscreen use. Denies worrisome, changing, or new skin lesions.   Status of chronic or acute concerns   Sees cardiology in December- Dr. Lovena Le Will get lipid panel through [facility] in December with 3 month labs  UA done through urology  Right shoulder mass- referred to general surgery 06/12/15- they said monitor only- has not grown since that time  Essential hypertension S: controlled poorly on hctz 12.5 mg and lisinopril 20mg .  BP Readings from Last 3 Encounters:  05/15/17 128/82  07/10/16 126/78  06/20/15 130/80  A/P: We discussed blood pressure goal of <140/90. Continue current meds:  Controlled on repeat   Hyperlipidemia S:  controlled on last check on atorvastatin 20mg . No myalgias.  Lab Results  Component Value Date   CHOL 139 06/01/2015   HDL 46.80 06/01/2015   LDLCALC 79 06/01/2015   LDLDIRECT 168.8 05/13/2013   TRIG  64.0 06/01/2015   CHOLHDL 3 06/01/2015   A/P: continue current medications. He is going to ask [facility] with q3 month labs to check lipid levels  1 year CPE, 6 month cbg/a1c check potentially  Orders Placed This Encounter  Procedures  . Pneumococcal polysaccharide vaccine 23-valent greater than or equal to 2yo subcutaneous/IM   Return precautions advised.  Garret Reddish, MD

## 2017-05-28 LAB — COLOGUARD: Cologuard: POSITIVE

## 2017-11-24 ENCOUNTER — Other Ambulatory Visit: Payer: Self-pay | Admitting: Family Medicine

## 2017-11-27 ENCOUNTER — Telehealth: Payer: Self-pay | Admitting: Internal Medicine

## 2017-11-27 NOTE — Telephone Encounter (Signed)
Pt scheduled to see Dr. Lovena Le 6/6

## 2017-11-27 NOTE — Telephone Encounter (Signed)
New Message   Patient is calling because he wants to make an appointment with Dr. Lovena Le to schedule a surgical clearance. He does not want the requesting surgeon to call to begin the surgical clearance he states "I will bring the paper work with me, because they are not going to call you all". He advise that someone needs to make sure he comes in ASAP since it surgery and he has been through this before.

## 2017-12-17 ENCOUNTER — Encounter: Payer: Medicare Other | Admitting: Internal Medicine

## 2017-12-21 NOTE — H&P (Signed)
TOTAL HIP ADMISSION H&P  Patient is admitted for right total hip arthroplasty, anterior approach.  Subjective:  Chief Complaint: Right hip primary OA / pain  HPI: Devin Mercado, 67 y.o. male, has a history of pain and functional disability in the right hip(s) due to arthritis and patient has failed non-surgical conservative treatments for greater than 12 weeks to include NSAID's and/or analgesics and activity modification.  Onset of symptoms was abrupt starting 6+ months  ago with gradually worsening course since that time.The patient noted prior procedures of the hip to include arthroplasty on the left hip per Dr. Alvan Dame in December of 2015.  Patient currently rates pain in the right hip at 6 out of 10 with activity. Patient has worsening of pain with activity and weight bearing, trendelenberg gait, pain that interfers with activities of daily living and pain with passive range of motion. Patient has evidence of periarticular osteophytes and joint space narrowing by imaging studies. This condition presents safety issues increasing the risk of falls.  There is no current active infection.  Risks, benefits and expectations were discussed with the patient.  Risks including but not limited to the risk of anesthesia, blood clots, nerve damage, blood vessel damage, failure of the prosthesis, infection and up to and including death.  Patient understand the risks, benefits and expectations and wishes to proceed with surgery.   PCP: [name], MD  D/C Plans:       Home   Post-op Meds:       No Rx given  Tranexamic Acid:      To be given - IV   Decadron:      Is to be given  FYI:      ASA  Norco  DME:   Pt already has equipment   PT:   No PT    Patient Active Problem List   Diagnosis Date Noted  . Hyperglycemia 06/12/2015  . Prostate cancer ([place]) 09/12/2014  . Obese 07/04/2014  . S/P left THA, AA 07/03/2014  . Preop cardiovascular exam 06/07/2014  . PPM-Medtronic 05/23/2010  .  Hyperlipidemia 03/28/2009  . Atrioventricular block, complete ([place]) 03/28/2009  . Essential hypertension 03/16/2008   Past Medical History:  Diagnosis Date  . Arthritis    OA AND PAIN LEFT HIP  . CHB (complete heart block) ([place])    a. MDT dual chamber pacemaker  . HTN (hypertension)   . Hyperlipidemia   . Presence of permanent cardiac pacemaker   . Prostate cancer [facility])     Past Surgical History:  Procedure Laterality Date  . ADENOIDECTOMY AS A CHILD    . LYMPHADENECTOMY Bilateral 11/06/2014   Procedure: PELVIC LYMPHADENECTOMY;  Surgeon: [name], MD;  [facility];  Service: Urology;  Laterality: Bilateral;  . PACEMAKER INSERTION  2000; 2012   MDT dual chamber pacemaker implanted 2000 with gen change 2012  . PROSTATE BIOPSY  09/05/14  . ROBOT ASSISTED LAPAROSCOPIC RADICAL PROSTATECTOMY N/A 11/06/2014   Procedure: ROBOTIC ASSISTED LAPAROSCOPIC RADICAL PROSTATECTOMY LEVEL 2;  Surgeon: [name], MD;  [facility];  Service: Urology;  Laterality: N/A;  . TOTAL HIP ARTHROPLASTY Left 07/03/2014   Procedure: LEFT TOTAL HIP ARTHROPLASTY ANTERIOR APPROACH;  Surgeon: [name], MD;  [facility];  Service: Orthopedics;  Laterality: Left;  . WISDOM TEETH EXTRACTIONS      No current facility-administered medications for this encounter.    Current Outpatient Medications  Medication Sig Dispense Refill Last Dose  . aspirin 81 MG  tablet Take 81 mg by mouth daily.   Taking  . atorvastatin (LIPITOR) 20 MG tablet TAKE ONE TABLET BY MOUTH ONCE DAILY 90 tablet 3 Taking  . hydrochlorothiazide (HYDRODIURIL) 12.5 MG tablet Take 1 tablet by mouth daily.   Taking  . lisinopril (PRINIVIL,ZESTRIL) 20 MG tablet TAKE ONE TABLET BY MOUTH ONCE DAILY 90 tablet 3    No Known Allergies  Social History   Tobacco Use  . Smoking status: Never Smoker  . Smokeless tobacco: Never Used  Substance Use Topics  . Alcohol use: Yes    Alcohol/week: 1.2 - 1.8 oz    Types: 2 - 3 Standard  drinks or equivalent per week    Comment: 5 TO 7 DRINKS A WEEK stopped etoh use pending surgery     Family History  Problem Relation Age of Onset  . Stroke Mother        multiple. 80.   . Heart disease Mother        hx heart valve problem  . Prostate cancer Father        63 of prostate cancer  . Heart attack Neg Hx   . Hypertension Neg Hx      Review of Systems  Constitutional: Negative.   HENT: Negative.   Eyes: Negative.   Respiratory: Negative.   Cardiovascular: Negative.   Gastrointestinal: Negative.   Genitourinary: Negative.   Musculoskeletal: Positive for joint pain.  Skin: Negative.   Neurological: Negative.   Endo/Heme/Allergies: Negative.   Psychiatric/Behavioral: Negative.     Objective:  Physical Exam  Constitutional: He is oriented to person, place, and time. He appears well-developed.  HENT:  Head: Normocephalic.  Eyes: Pupils are equal, round, and reactive to light.  Neck: Neck supple. No JVD present. No tracheal deviation present. No thyromegaly present.  Cardiovascular: Normal rate, regular rhythm and intact distal pulses.  Respiratory: Effort normal and breath sounds normal. No respiratory distress. He has no wheezes.  GI: Soft. There is no tenderness. There is no guarding.  Musculoskeletal:       Right hip: He exhibits decreased range of motion, decreased strength, tenderness and bony tenderness. He exhibits no swelling, no deformity and no laceration.  Lymphadenopathy:    He has no cervical adenopathy.  Neurological: He is alert and oriented to person, place, and time.  Skin: Skin is warm and dry.  Psychiatric: He has a normal mood and affect.      Labs:  Estimated body mass index is 35.52 kg/m as calculated from the following:   Height as of 05/15/17: 5\' 8"  (1.727 m).   Weight as of 05/15/17: 106 kg (233 lb 9.6 oz).   Imaging Review Plain radiographs demonstrate severe degenerative joint disease of the right hip(s). The bone quality  appears to be good for age and reported activity level.    Preoperative templating of the joint replacement has been completed, documented, and submitted to the Operating Room personnel in order to optimize intra-operative equipment management.     Assessment/Plan:  End stage arthritis, right hip  The patient history, physical examination, clinical judgement of the provider and imaging studies are consistent with end stage degenerative joint disease of the right hip and total hip arthroplasty is deemed medically necessary. The treatment options including medical management, injection therapy, arthroscopy and arthroplasty were discussed at length. The risks and benefits of total hip arthroplasty were presented and reviewed. The risks due to aseptic loosening, infection, stiffness, dislocation/subluxation,  thromboembolic complications and other imponderables were discussed.  The patient acknowledged the explanation, agreed to proceed with the plan and consent was signed. Patient is being admitted for inpatient treatment for surgery, pain control, PT, OT, prophylactic antibiotics, VTE prophylaxis, progressive ambulation and ADL's and discharge planning.The patient is planning to be discharged home.     [place] Pugh Aneesha Holloran   PA-C  12/21/2017, 12:33 PM

## 2017-12-22 ENCOUNTER — Encounter: Payer: Self-pay | Admitting: Family Medicine

## 2017-12-22 ENCOUNTER — Ambulatory Visit: Payer: Medicare Other | Admitting: Family Medicine

## 2017-12-22 VITALS — BP 124/80 | HR 74 | Temp 98.7°F | Ht 68.0 in | Wt 232.8 lb

## 2017-12-22 DIAGNOSIS — E785 Hyperlipidemia, unspecified: Secondary | ICD-10-CM

## 2017-12-22 DIAGNOSIS — I10 Essential (primary) hypertension: Secondary | ICD-10-CM | POA: Diagnosis not present

## 2017-12-22 DIAGNOSIS — I442 Atrioventricular block, complete: Secondary | ICD-10-CM | POA: Diagnosis not present

## 2017-12-22 NOTE — Patient Instructions (Addendum)
Health Maintenance Due  Topic Date Due  . Hepatitis C Screening - Patient declined 12-04-1950  . COLONOSCOPY - Patient had it done through cologuard. I am going to talk to [facility] and see if we can hunt down results- dont see in epic right now.  09/12/1998   Best of luck on your surgery- I don't have any concerns.

## 2017-12-22 NOTE — Progress Notes (Signed)
Subjective:  Devin Mercado is a 67 y.o. year old very pleasant male patient who presents for/with See problem oriented charting ROS- No chest pain or shortness of breath. No headache or blurry vision.    Past Medical History-  Patient Active Problem List   Diagnosis Date Noted  . Prostate cancer ([place]) 09/12/2014    Priority: High  . PPM-Medtronic 05/23/2010    Priority: High  . Atrioventricular block, complete ([place]) 03/28/2009    Priority: High  . Hyperglycemia 06/12/2015    Priority: Medium  . Hyperlipidemia 03/28/2009    Priority: Medium  . Essential hypertension 03/16/2008    Priority: Medium  . Obese 07/04/2014    Priority: Low  . S/P left THA, AA 07/03/2014    Priority: Low  . Preop cardiovascular exam 06/07/2014    Medications- reviewed and updated Current Outpatient Medications  Medication Sig Dispense Refill  . aspirin 81 MG tablet Take 81 mg by mouth daily.    Marland Kitchen atorvastatin (LIPITOR) 20 MG tablet TAKE ONE TABLET BY MOUTH ONCE DAILY 90 tablet 3  . hydrochlorothiazide (HYDRODIURIL) 12.5 MG tablet Take 1 tablet by mouth daily.    Marland Kitchen lisinopril (PRINIVIL,ZESTRIL) 20 MG tablet TAKE ONE TABLET BY MOUTH ONCE DAILY 90 tablet 3   No current facility-administered medications for this visit.     Objective: BP 124/80 (BP Location: Left Arm, Patient Position: Sitting, Cuff Size: Normal)   Pulse 74   Temp 98.7 F (37.1 C) (Oral)   Ht 5\' 8"  (1.727 m)   Wt 232 lb 12.8 oz (105.6 kg)   SpO2 97%   BMI 35.40 kg/m  Gen: NAD, resting comfortably CV: RRR no murmurs rubs or gallops Lungs: CTAB no crackles, wheeze, rhonchi Abdomen: soft/nontender/nondistended/normal bowel sounds.  Ext: no edema Skin: warm, dry  Assessment/Plan:  Preoperative evaluation: Patient has a planned right total hip replacement on January 05, 2018 with Dr. Alvan Dame. He had a CBC and CMP about 6 weeks ago at [facility] which were reassuring.  He is able to complete 4 METS of activity without any difficulty  including no chest pain or shortness of breath.  He can proceed forward with surgery- he is of acceptable surgical risk.  He can hold his aspirin up to 14 days prior to procedure if desired but not necessary    Other notes: 1.Cologuard  did this after physical- he never received results- we will call for results  Hyperlipidemia S:  controlled on atorvastatin 20 mg.  He reported at last visit that they were going to update his lipid panel at [facility]-I do not see any evidence of this lipid panel. Lab Results  Component Value Date   CHOL 139 06/01/2015   HDL 46.80 06/01/2015   LDLCALC 79 06/01/2015   LDLDIRECT 168.8 05/13/2013   TRIG 64.0 06/01/2015   CHOLHDL 3 06/01/2015   A/P: I gave him option of updating lipids today-he prefers to do with [facility] next visit.  Since he is compliant with his statin I do not have any concern about him moving forward with surgery  Atrioventricular block, complete  S: Patient has complete heart block-he has a pacemaker in place.  He is asymptomatic. A/P: I do not see any reason that he cannot proceed forward with surgery- he will have this confirmed with cardiology tomorrow  Essential hypertension S: controlled on HCTZ 12.5 mg and lisinopril 20 mg BP Readings from Last 3 Encounters:  12/22/17 124/80  05/15/17 128/82  07/10/16 126/78  A/P: We discussed blood  pressure goal of <140/90. Continue current meds   Future Appointments  Date Time Provider [place]  12/23/2017 11:00 AM [name], MD [id number]   Physical later this year likely  Return precautions advised.  Garret Reddish, MD

## 2017-12-22 NOTE — Assessment & Plan Note (Signed)
S:  controlled on atorvastatin 20 mg.  He reported at last visit that they were going to update his lipid panel at [facility]-I do not see any evidence of this lipid panel. Lab Results  Component Value Date   CHOL 139 06/01/2015   HDL 46.80 06/01/2015   LDLCALC 79 06/01/2015   LDLDIRECT 168.8 05/13/2013   TRIG 64.0 06/01/2015   CHOLHDL 3 06/01/2015   A/P: I gave him option of updating lipids today-he prefers to do with [facility] next visit.  Since he is compliant with his statin I do not have any concern about him moving forward with surgery

## 2017-12-22 NOTE — Assessment & Plan Note (Signed)
S: controlled on HCTZ 12.5 mg and lisinopril 20 mg BP Readings from Last 3 Encounters:  12/22/17 124/80  05/15/17 128/82  07/10/16 126/78  A/P: We discussed blood pressure goal of <140/90. Continue current meds

## 2017-12-22 NOTE — Assessment & Plan Note (Signed)
S: Patient has complete heart block-he has a pacemaker in place.  He is asymptomatic. A/P: I do not see any reason that he cannot proceed forward with surgery- he will have this confirmed with cardiology tomorrow

## 2017-12-23 ENCOUNTER — Telehealth: Payer: Self-pay

## 2017-12-23 ENCOUNTER — Encounter: Payer: Self-pay | Admitting: Internal Medicine

## 2017-12-23 ENCOUNTER — Ambulatory Visit: Payer: Medicare Other | Admitting: Internal Medicine

## 2017-12-23 VITALS — BP 142/90 | HR 70 | Ht 71.0 in | Wt 230.0 lb

## 2017-12-23 DIAGNOSIS — I442 Atrioventricular block, complete: Secondary | ICD-10-CM | POA: Diagnosis not present

## 2017-12-23 DIAGNOSIS — I10 Essential (primary) hypertension: Secondary | ICD-10-CM | POA: Diagnosis not present

## 2017-12-23 DIAGNOSIS — Z950 Presence of cardiac pacemaker: Secondary | ICD-10-CM | POA: Diagnosis not present

## 2017-12-23 NOTE — Progress Notes (Addendum)
HPI [name] returns today for follow-up. He is a 67 year old man with a history of hypertension, dyslipidemia, and complete heart block. He is status post permanent pacemaker incision. He feels well. He is exercising 3 times a week. No syncope and no chest pain or sob. He underwent left hip replacement over a year ago and is pending right hip replacement.    No Known Allergies   Current Outpatient Medications  Medication Sig Dispense Refill  . atorvastatin (LIPITOR) 20 MG tablet TAKE ONE TABLET BY MOUTH ONCE DAILY 90 tablet 3  . hydrochlorothiazide (HYDRODIURIL) 12.5 MG tablet Take 1 tablet by mouth daily.    Marland Kitchen leuprolide, 6 Month, (ELIGARD) 45 MG injection Inject 45 mg into the skin every 6 (six) months.    Marland Kitchen lisinopril (PRINIVIL,ZESTRIL) 20 MG tablet TAKE ONE TABLET BY MOUTH ONCE DAILY 90 tablet 3  . aspirin 81 MG tablet Take 81 mg by mouth daily.     No current facility-administered medications for this visit.      Past Medical History:  Diagnosis Date  . Arthritis    OA AND PAIN LEFT HIP  . CHB (complete heart block) ([place])    a. MDT dual chamber pacemaker  . HTN (hypertension)   . Hyperlipidemia   . Presence of permanent cardiac pacemaker   . Prostate cancer ([place])     ROS:   All systems reviewed and negative except as noted in the HPI.   Past Surgical History:  Procedure Laterality Date  . ADENOIDECTOMY AS A CHILD    . LYMPHADENECTOMY Bilateral 11/06/2014   Procedure: PELVIC LYMPHADENECTOMY;  Surgeon: [name], MD;  Location: [facility];  Service: Urology;  Laterality: Bilateral;  . PACEMAKER INSERTION  2000; 2012   MDT dual chamber pacemaker implanted 2000 with gen change 2012  . PROSTATE BIOPSY  09/05/14  . ROBOT ASSISTED LAPAROSCOPIC RADICAL PROSTATECTOMY N/A 11/06/2014   Procedure: ROBOTIC ASSISTED LAPAROSCOPIC RADICAL PROSTATECTOMY LEVEL 2;  Surgeon: [name], MD;  Location: [facility];  Service: Urology;  Laterality: N/A;  . TOTAL HIP ARTHROPLASTY  Left 07/03/2014   Procedure: LEFT TOTAL HIP ARTHROPLASTY ANTERIOR APPROACH;  Surgeon: [name], MD;  Location: [facility];  Service: Orthopedics;  Laterality: Left;  . WISDOM TEETH EXTRACTIONS       Family History  Problem Relation Age of Onset  . Stroke Mother        multiple. 71.   . Heart disease Mother        hx heart valve problem  . Prostate cancer Father        68 of prostate cancer  . Heart attack Neg Hx   . Hypertension Neg Hx      Social History   Socioeconomic History  . Marital status: Married    Spouse name: Not on file  . Number of children: 4  . Years of education: Not on file  . Highest education level: Not on file  Occupational History  . Occupation: attorney  Social Needs  . Financial resource strain: Not on file  . Food insecurity:    Worry: Not on file    Inability: Not on file  . Transportation needs:    Medical: Not on file    Non-medical: Not on file  Tobacco Use  . Smoking status: Never Smoker  . Smokeless tobacco: Never Used  Substance and Sexual Activity  . Alcohol use: Yes    Alcohol/week: 1.2 - 1.8 oz    Types:  2 - 3 Standard drinks or equivalent per week    Comment: 5 TO 7 DRINKS A WEEK stopped etoh use pending surgery   . Drug use: No  . Sexual activity: Not on file  Lifestyle  . Physical activity:    Days per week: Not on file    Minutes per session: Not on file  . Stress: Not on file  Relationships  . Social connections:    Talks on phone: Not on file    Gets together: Not on file    Attends religious service: Not on file    Active member of club or organization: Not on file    Attends meetings of clubs or organizations: Not on file    Relationship status: Not on file  . Intimate partner violence:    Fear of current or ex partner: Not on file    Emotionally abused: Not on file    Physically abused: Not on file    Forced sexual activity: Not on file  Other Topics Concern  . Not on file  Social History Narrative    Married to Dr. [name] 1979. 4 kids (1 lost to auto accident-3 surviving), no grandkids. 1 daughter married in 2016 and works as Forensic psychologist in [place].       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[employer], vacation     BP (!) 142/90   Pulse 70   Ht 5\' 11"  (1.803 m)   Wt 230 lb (104.3 kg)   BMI 32.08 kg/m   Physical Exam:  Well appearing 67 yo man, NAD HEENT: Unremarkable Neck:  6 cm JVD, no thyromegally Lymphatics:  No adenopathy Back:  No CVA tenderness Lungs:  Clear with no wheezes HEART:  Regular rate rhythm, no murmurs, no rubs, no clicks Abd:  soft, positive bowel sounds, no organomegally, no rebound, no guarding Ext:  2 plus pulses, no edema, no cyanosis, no clubbing Skin:  No rashes no nodules Neuro:  CN II through XII intact, motor grossly intact  EKG - nsr with ventricular pacing  DEVICE  Normal device function.  See PaceArt for details.   Assess/Plan: 1. CHB - he is stable s/p PPM insertion. 2. PPM - interogation of his medtronic ddd PPM demonstrates normal function. 3. Palpitations - he has been mostly asymptomatic and device interogation demonstrates no significant arrhythmias. 4. Preoperative eval - the patient is low risk for cardiac complications from pending hip replacement surgery.   Mikle Bosworth.D.

## 2017-12-23 NOTE — Patient Instructions (Signed)
Medication Instructions:  Your physician recommends that you continue on your current medications as directed. Please refer to the Current Medication list given to you today.  Labwork: None ordered.  Testing/Procedures: None ordered.  Follow-Up: Your physician wants you to follow-up in: one year with Dr. Lovena Le.   You will receive a reminder letter in the mail two months in advance. If you don't receive a letter, please call our office to schedule the follow-up appointment.  Remote monitoring is used to monitor your Pacemaker from home. This monitoring reduces the number of office visits required to check your device to one time per year. It allows [place] to keep an eye on the functioning of your device to ensure it is working properly. You are scheduled for a device check from home on 03/24/2018. You may send your transmission at any time that day. If you have a wireless device, the transmission will be sent automatically. After your physician reviews your transmission, you will receive a postcard with your next transmission date.  Any Other Special Instructions Will Be Listed Below (If Applicable).  If you need a refill on your cardiac medications before your next appointment, please call your pharmacy.

## 2017-12-23 NOTE — Telephone Encounter (Signed)
    [facility] Pre-operative Risk Assessment    Request for surgical clearance:  1. What type of surgery is being performed? Right Hip:  THA w/wo autograft/allograft   2. When is this surgery scheduled? 01/05/18   3. What type of clearance is required (medical clearance vs. Pharmacy clearance to hold med vs. Both)? Both  4. Are there any medications that need to be held prior to surgery and how long?Aspirin   5. Practice name and name of physician performing surgery? [facility]/ Dr Paralee Cancel   6. What is your office phone number [phone]    7.   What is your office fax number [phone]  8.   Anesthesia type (None, local, MAC, general) ? Spinal   Devin Mercado 12/23/2017, 2:23 PM  _________________________________________________________________   (provider comments below)

## 2017-12-23 NOTE — Telephone Encounter (Signed)
See note.   Copied from [place] [id number]. Topic: General - Other >> Dec 23, 2017  4:30 PM [name] L wrote: Reason for CRM: Patient would like a call back to discuss referral to GI. He would like to speak specifically to Dr. Yong Channel.

## 2017-12-24 ENCOUNTER — Telehealth: Payer: Self-pay | Admitting: Family Medicine

## 2017-12-24 ENCOUNTER — Other Ambulatory Visit: Payer: Self-pay

## 2017-12-24 DIAGNOSIS — R195 Other fecal abnormalities: Secondary | ICD-10-CM

## 2017-12-24 NOTE — Telephone Encounter (Signed)
   Primary Cardiologist: Cristopher Peru, MD  Chart reviewed as part of pre-operative protocol coverage.   The patient seen by Dr. Lovena Le yesterday and cleared "the patient is low risk for cardiac complications from pending hip replacement surgery". Okay to hold ASA 5-7 days.   I will route this recommendation to the requesting party via Epic fax function and remove from pre-op pool.  Please call with questions.  [place], [place] 12/24/2017, 1:53 PM

## 2017-12-24 NOTE — Telephone Encounter (Signed)
Spoke with patient's spouse, Dr. [name] of [facility].  She and her husband have discussed the results of the Cologuard.  Unfortunately we did not receive the results until about 8 months after they were done and that was when we called Cologuard for the results after patient mentioned he hadn't heard about results.  She is concerned about this delay and would like information before his hip replacement on June 25th if at all possible by getting colonoscopy  Unfortunately patient has never had a colonoscopy.  I referred him in 39 in 2016 but these were not completed-2016 he was focusing on prostate cancer treatment.  He returned in 2018 for another physical and opted for Cologuard at that time. I also see he was referred in 2013, 2014 by Dr. Leanne Chang in epic- not clear why these were not completed. Appears voicemails were left with patient to call to schedule.   His surgery was on June 25- I told her I would do my best to try to get him in before that time but that it would be very difficult.  She also plans to call.  We will place a stat referral and I am happy to speak with 1 of our [facility] gastro[facility] physicians.

## 2017-12-25 ENCOUNTER — Ambulatory Visit (AMBULATORY_SURGERY_CENTER): Payer: Self-pay

## 2017-12-25 VITALS — Ht 71.0 in | Wt 230.2 lb

## 2017-12-25 DIAGNOSIS — R195 Other fecal abnormalities: Secondary | ICD-10-CM

## 2017-12-25 MED ORDER — NA SULFATE-K SULFATE-MG SULF 17.5-3.13-1.6 GM/177ML PO SOLN: 1.0000 | Freq: Once | ORAL | 0 refills | Status: AC

## 2017-12-25 NOTE — Progress Notes (Signed)
Per pt, no allergies to soy or egg products.Pt not taking any weight loss meds or using  O2 at home.  Pt refused emmi video. 

## 2017-12-30 ENCOUNTER — Encounter (HOSPITAL_COMMUNITY)
Admission: RE | Disposition: A | Discharge status: Home / Self Care | Payer: Self-pay | Source: Ambulatory Visit | Attending: Gastroenterology

## 2017-12-30 ENCOUNTER — Ambulatory Visit (HOSPITAL_COMMUNITY): Payer: Medicare Other | Admitting: Anesthesiology

## 2017-12-30 ENCOUNTER — Other Ambulatory Visit: Payer: Self-pay

## 2017-12-30 ENCOUNTER — Ambulatory Visit (HOSPITAL_COMMUNITY)
Admission: RE | Admit: 2017-12-30 | Discharge: 2017-12-30 | Disposition: A | Discharge status: Home / Self Care | Payer: Medicare Other | Source: Ambulatory Visit | Attending: Gastroenterology | Admitting: Gastroenterology

## 2017-12-30 ENCOUNTER — Encounter (HOSPITAL_COMMUNITY): Payer: Self-pay | Admitting: *Deleted

## 2017-12-30 DIAGNOSIS — I10 Essential (primary) hypertension: Secondary | ICD-10-CM | POA: Insufficient documentation

## 2017-12-30 DIAGNOSIS — Z8546 Personal history of malignant neoplasm of prostate: Secondary | ICD-10-CM | POA: Insufficient documentation

## 2017-12-30 DIAGNOSIS — K5730 Diverticulosis of large intestine without perforation or abscess without bleeding: Secondary | ICD-10-CM | POA: Insufficient documentation

## 2017-12-30 DIAGNOSIS — Z79899 Other long term (current) drug therapy: Secondary | ICD-10-CM | POA: Diagnosis not present

## 2017-12-30 DIAGNOSIS — D128 Benign neoplasm of rectum: Secondary | ICD-10-CM

## 2017-12-30 DIAGNOSIS — E785 Hyperlipidemia, unspecified: Secondary | ICD-10-CM | POA: Insufficient documentation

## 2017-12-30 DIAGNOSIS — K640 First degree hemorrhoids: Secondary | ICD-10-CM | POA: Insufficient documentation

## 2017-12-30 DIAGNOSIS — Z950 Presence of cardiac pacemaker: Secondary | ICD-10-CM | POA: Diagnosis not present

## 2017-12-30 DIAGNOSIS — D123 Benign neoplasm of transverse colon: Secondary | ICD-10-CM | POA: Diagnosis not present

## 2017-12-30 DIAGNOSIS — Z7982 Long term (current) use of aspirin: Secondary | ICD-10-CM | POA: Diagnosis not present

## 2017-12-30 DIAGNOSIS — K5520 Angiodysplasia of colon without hemorrhage: Secondary | ICD-10-CM | POA: Diagnosis not present

## 2017-12-30 DIAGNOSIS — D125 Benign neoplasm of sigmoid colon: Secondary | ICD-10-CM | POA: Diagnosis not present

## 2017-12-30 DIAGNOSIS — Z96642 Presence of left artificial hip joint: Secondary | ICD-10-CM | POA: Insufficient documentation

## 2017-12-30 DIAGNOSIS — R195 Other fecal abnormalities: Secondary | ICD-10-CM | POA: Diagnosis present

## 2017-12-30 HISTORY — PX: POLYPECTOMY: SHX5525

## 2017-12-30 HISTORY — PX: COLONOSCOPY WITH PROPOFOL: SHX5780

## 2017-12-30 SURGERY — COLONOSCOPY WITH PROPOFOL
Anesthesia: Monitor Anesthesia Care

## 2017-12-30 MED ORDER — PROPOFOL 500 MG/50ML IV EMUL
INTRAVENOUS | Status: DC | PRN
  Administered 2017-12-30: 40 mg via INTRAVENOUS
  Administered 2017-12-30 (×3): 20 mg via INTRAVENOUS

## 2017-12-30 MED ORDER — PROPOFOL 10 MG/ML IV BOLUS
INTRAVENOUS | Status: AC
  Filled 2017-12-30: qty 20

## 2017-12-30 MED ORDER — LACTATED RINGERS IV SOLN
INTRAVENOUS | Status: DC
  Administered 2017-12-30: 11:00:00 via INTRAVENOUS

## 2017-12-30 MED ORDER — PROPOFOL 500 MG/50ML IV EMUL
INTRAVENOUS | Status: DC | PRN
  Administered 2017-12-30: 100 ug/kg/min via INTRAVENOUS

## 2017-12-30 MED ORDER — PROPOFOL 10 MG/ML IV BOLUS
INTRAVENOUS | Status: AC
  Filled 2017-12-30: qty 40

## 2017-12-30 MED ORDER — SODIUM CHLORIDE 0.9 % IV SOLN: INTRAVENOUS | Status: DC

## 2017-12-30 SURGICAL SUPPLY — 21 items

## 2017-12-30 NOTE — Anesthesia Procedure Notes (Signed)
Date/Time: 12/30/2017 1:22 PM Performed by: Glory Buff, CRNA Oxygen Delivery Method: Simple face mask

## 2017-12-30 NOTE — Progress Notes (Signed)
12/23/2017- clearance in epic , LOV- Dr Lovena Le- epic EKG- epic

## 2017-12-30 NOTE — Op Note (Signed)
[facility] Patient Name: Devin Mercado Procedure Date: 12/30/2017 MRN: [id number] Attending MD: Ladene Artist , MD Date of Birth: 1951-05-17 CSN: [id number] Age: 67 Admit Type: Outpatient Procedure:                Colonoscopy Indications:              Positive Cologuard test Providers:                Pricilla Riffle. Fuller Plan, MD, Kingsley Plan, RN,                            William Dalton, Technician Referring MD:             Brayton Mars. [name] MD Medicines:                Monitored Anesthesia Care Complications:            No immediate complications. Estimated blood loss:                            None. Estimated Blood Loss:     Estimated blood loss: none. Procedure:                Pre-Anesthesia Assessment:                           - Prior to the procedure, a History and Physical                            was performed, and patient medications and                            allergies were reviewed. The patient's tolerance of                            previous anesthesia was also reviewed. The risks                            and benefits of the procedure and the sedation                            options and risks were discussed with the patient.                            All questions were answered, and informed consent                            was obtained. Prior Anticoagulants: The patient has                            taken no previous anticoagulant or antiplatelet                            agents. ASA Grade Assessment: II - A patient with  mild systemic disease. After reviewing the risks                            and benefits, the patient was deemed in                            satisfactory condition to undergo the procedure.                           After obtaining informed consent, the colonoscope                            was passed under direct vision. Throughout the                            procedure, the patient's  blood pressure, pulse, and                            oxygen saturations were monitored continuously. The                            EC-[id number] ([id number]) scope was introduced through                            the anus and advanced to the the cecum, identified                            by appendiceal orifice and ileocecal valve. The                            ileocecal valve, appendiceal orifice, and rectum                            were photographed. The quality of the bowel                            preparation was good. The colonoscopy was performed                            without difficulty. The patient tolerated the                            procedure well. Scope In: 1:30:49 PM Scope Out: 1:49:26 PM Scope Withdrawal Time: 0 hours 14 minutes 10 seconds  Total Procedure Duration: 0 hours 18 minutes 37 seconds  Findings:      The perianal and digital rectal examinations were normal.      A 10 mm polyp was found in the sigmoid colon. The polyp was       pedunculated. The polyp was removed with a hot snare. Resection and       retrieval were complete.      Three sessile polyps were found in the rectum (1) and transverse colon       (2). The polyps were 5 to 8 mm in size. These polyps were removed with a  cold snare. Resection and retrieval were complete.      A few small-mouthed diverticula were found in the left colon. There was       no evidence of diverticular bleeding.      Multiple small localized angioectasias without bleeding were found in       the rectum c/w radiation proctitis.      Internal hemorrhoids were found during retroflexion. The hemorrhoids       were small and Grade I (internal hemorrhoids that do not prolapse).      The exam was otherwise without abnormality on direct and retroflexion       views. Impression:               - One 10 mm polyp in the sigmoid colon, removed                            with a hot snare. Resected and retrieved.                            - Three 5 to 8 mm polyps in the rectum and in the                            transverse colon, removed with a cold snare.                            Resected and retrieved.                           - Mild scattered diverticulosis in the left colon.                            There was no evidence of diverticular bleeding.                           - Multiple non-bleeding colonic angioectasias.                           - Internal hemorrhoids.                           - The examination was otherwise normal on direct                            and retroflexion views. Moderate Sedation:      N/A- Per Anesthesia Care Recommendation:           - Repeat colonoscopy in 3 years for surveillance,                            pending pathology review.                           - Patient has a contact number available for                            emergencies. The signs and symptoms of potential  delayed complications were discussed with the                            patient. Return to normal activities tomorrow.                            Written discharge instructions were provided to the                            patient.                           - High fiber diet.                           - Continue present medications.                           - Await pathology results.                           - No aspirin, ibuprofen, naproxen, or other                            non-steroidal anti-inflammatory drugs for 2 weeks                            after polyp removal. Procedure Code(s):        --- Professional ---                           [id number], Colonoscopy, flexible; with removal of                            tumor(s), polyp(s), or other lesion(s) by snare                            technique Diagnosis Code(s):        --- Professional ---                           D12.5, Benign neoplasm of sigmoid colon                           K62.1, Rectal polyp                            D12.3, Benign neoplasm of transverse colon (hepatic                            flexure or splenic flexure)                           K64.0, First degree hemorrhoids                           K55.20, Angiodysplasia of colon without hemorrhage  R19.5, Other fecal abnormalities                           K57.30, Diverticulosis of large intestine without                            perforation or abscess without bleeding CPT copyright 2017 American Medical Association. All rights reserved. The codes documented in this report are preliminary and upon coder review may  be revised to meet current compliance requirements. Ladene Artist, MD 12/30/2017 1:58:52 PM This report has been signed electronically. Number of Addenda: 0

## 2017-12-30 NOTE — H&P (Signed)
ADMISSION NOTE  Primary Care Physician:  [name], MD Primary Gastroenterologist: Lucio Edward,  MD  CHIEF COMPLAINT:  Cologuard positive   HPI: Devin Mercado. Reeder is a 67 y.o. male with Cologuard positive. No GI complaints. History of CHB S/P dual chamber pacemaker. Denies weight loss, abdominal pain, constipation, diarrhea, change in stool caliber, melena, hematochezia, nausea, vomiting, dysphagia, reflux symptoms, chest pain.   Past Medical History:  Diagnosis Date  . Arthritis    OA AND PAIN LEFT HIP  . CHB (complete heart block) ([place])    a. MDT dual chamber pacemaker  . HTN (hypertension)   . Hyperlipidemia   . Presence of permanent cardiac pacemaker   . Prostate cancer ([place]) 09/2014    Past Surgical History:  Procedure Laterality Date  . ADENOIDECTOMY AS A CHILD    . LYMPHADENECTOMY Bilateral 11/06/2014   Procedure: PELVIC LYMPHADENECTOMY;  Surgeon: [name], MD;  [facility];  Service: Urology;  Laterality: Bilateral;  . PACEMAKER INSERTION  2000; 2012   MDT dual chamber pacemaker implanted 2000 with gen change 2012  . PROSTATE BIOPSY  09/05/14  . ROBOT ASSISTED LAPAROSCOPIC RADICAL PROSTATECTOMY N/A 11/06/2014   Procedure: ROBOTIC ASSISTED LAPAROSCOPIC RADICAL PROSTATECTOMY LEVEL 2;  Surgeon: [name], MD;  [facility];  Service: Urology;  Laterality: N/A;  . TOTAL HIP ARTHROPLASTY Left 07/03/2014   Procedure: LEFT TOTAL HIP ARTHROPLASTY ANTERIOR APPROACH;  Surgeon: [name], MD;  Location: [facility];  Service: Orthopedics;  Laterality: Left;  . WISDOM TEETH EXTRACTIONS      Prior to Admission medications   Medication Sig Start Date End Date Taking? Authorizing Provider  aspirin 81 MG tablet Take 81 mg by mouth daily.   Yes [provider]  atorvastatin (LIPITOR) 20 MG tablet TAKE ONE TABLET BY MOUTH ONCE DAILY 08/18/16  Yes [name], MD  hydrochlorothiazide (HYDRODIURIL) 12.5 MG tablet Take 12.5 mg by mouth daily.   05/02/16  Yes [provider]  lisinopril (PRINIVIL,ZESTRIL) 20 MG tablet TAKE ONE TABLET BY MOUTH ONCE DAILY 11/24/17  Yes [name], MD  leuprolide, 6 Month, (ELIGARD) 45 MG injection Inject 45 mg into the skin every 6 (six) months.    [provider]    Current Facility-Administered Medications  Medication Dose Route Frequency Provider Last Rate Last Dose  . lactated ringers infusion   Intravenous Continuous Ladene Artist, MD 20 mL/hr at 12/30/17 1127      Allergies as of 12/24/2017  . (No Known Allergies)    Family History  Problem Relation Age of Onset  . Stroke Mother        multiple. 67.   . Heart disease Mother        hx heart valve problem  . Prostate cancer Father        63 of prostate cancer  . Heart attack Neg Hx   . Hypertension Neg Hx     Social History   Socioeconomic History  . Marital status: Married    Spouse name: Not on file  . Number of children: 4  . Years of education: Not on file  . Highest education level: Not on file  Occupational History  . Occupation: attorney  Social Needs  . Financial resource strain: Not on file  . Food insecurity:    Worry: Not on file    Inability: Not on file  . Transportation needs:    Medical: Not on file    Non-medical: Not on  file  Tobacco Use  . Smoking status: Never Smoker  . Smokeless tobacco: Never Used  Substance and Sexual Activity  . Alcohol use: Yes    Alcohol/week: 1.2 - 1.8 oz    Types: 2 - 3 Standard drinks or equivalent per week    Comment: 5 TO 7 DRINKS A WEEK stopped etoh use pending surgery   . Drug use: No  . Sexual activity: Not on file  Lifestyle  . Physical activity:    Days per week: Not on file    Minutes per session: Not on file  . Stress: Not on file  Relationships  . Social connections:    Talks on phone: Not on file    Gets together: Not on file    Attends religious service: Not on file    Active member of club or organization: Not on file     Attends meetings of clubs or organizations: Not on file    Relationship status: Not on file  . Intimate partner violence:    Fear of current or ex partner: Not on file    Emotionally abused: Not on file    Physically abused: Not on file    Forced sexual activity: Not on file  Other Topics Concern  . Not on file  Social History Narrative   Married to Dr. [name] 1979. 4 kids (1 lost to auto accident-3 surviving), no grandkids. 1 daughter married in 2016 and works as Forensic psychologist in [place].       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  Gen: Denies any fever, chills, sweats, anorexia, fatigue, weakness, malaise, weight loss, and sleep disorder CV: Denies chest pain, angina, palpitations, syncope, orthopnea, PND, peripheral edema, and claudication. Resp: Denies dyspnea at rest, dyspnea with exercise, cough, sputum, wheezing, coughing up blood, and pleurisy. GI: Denies vomiting blood, jaundice, and fecal incontinence.   Denies dysphagia or odynophagia. GU : Denies urinary burning, blood in urine, urinary frequency, urinary hesitancy, nocturnal urination, and urinary incontinence. MS: Denies joint pain, limitation of movement, and swelling, stiffness, low back pain, extremity pain. Denies muscle weakness, cramps, atrophy.  Derm: Denies rash, itching, dry skin, hives, moles, warts, or unhealing ulcers.  Psych: Denies depression, anxiety, memory loss, suicidal ideation, hallucinations, paranoia, and confusion. Heme: Denies bruising, bleeding, and enlarged lymph nodes. Neuro:  Denies any headaches, dizziness, paresthesias. Endo:  Denies any problems with DM, thyroid, adrenal function.  Physical Exam: Vital signs in last 24 hours: Temp:  [98.2 F (36.8 C)] 98.2 F (36.8 C) (06/19 1120) Pulse Rate:  [90] 90 (06/19 1120) Resp:  [12] 12 (06/19 1120) BP: (182)/(95) 182/95 (06/19 1120) SpO2:   [98 %] 98 % (06/19 1120) Weight:  [230 lb 3.2 oz (104.4 kg)] 230 lb 3.2 oz (104.4 kg) (06/19 1120)   General:  Alert, well-developed, in NAD Head:  Normocephalic and atraumatic. Eyes:  Sclera clear, no icterus.   Conjunctiva pink. Ears:  Normal auditory acuity. Mouth:  No deformity or lesions.  Neck:  Supple; no masses . Lungs:  Clear throughout to auscultation.   No wheezes, crackles, or rhonchi. No acute distress. Heart:  Regular rate and rhythm; no murmurs. Abdomen:  Soft, nondistended, nontender. No masses, hepatomegaly. No obvious masses.  Normal bowel .    Rectal:  Deferred to colonoscopy Msk:  Symmetrical without gross deformities.. Pulses:  Normal pulses noted.  Extremities:  Without edema. Neurologic:  Alert and  oriented x4;  grossly normal neurologically. Skin:  Intact without significant lesions or rashes. Cervical Nodes:  No significant cervical adenopathy. Psych:  Alert and cooperative. Normal mood and affect.   Impression / Plan:   1. Cologuard positive. Colonoscopy to R/O colorectal neoplasms. The risks (including bleeding, perforation, infection, missed lesions, medication reactions and possible hospitalization or surgery if complications occur), benefits, and alternatives to colonoscopy with possible biopsy and possible polypectomy were discussed with the patient and they consent to proceed.     LOS: 0 days   Tayli Buch T. Fuller Plan MD 12/30/2017, 11:49 AM [phone] office

## 2017-12-30 NOTE — Patient Instructions (Signed)
Devin Mercado  12/30/2017   Your procedure is scheduled on: 01/05/2018   Report to [facility] Main  Entrance  Report to admitting at   [place] AM    Call this number if you have problems the morning of surgery [phone]   Remember: Do not eat food or drink liquids :After Midnight.     Take these medicines the morning of surgery with A SIP OF WATER: none                                 You may not have any metal on your body including hair pins and              piercings  Do not wear jewelry, , lotions, powders or perfumes, deodorant                        Men may shave face and neck.   Do not bring valuables to the hospital. [place].  Contacts, dentures or bridgework may not be worn into surgery.  Leave suitcase in the car. After surgery it may be brought to your room.                   Please read over the following fact sheets you were given: _____________________________________________________________________             [facility] - Preparing for Surgery Before surgery, you can play an important role.  Because skin is not sterile, your skin needs to be as free of germs as possible.  You can reduce the number of germs on your skin by washing with CHG (chlorahexidine gluconate) soap before surgery.  CHG is an antiseptic cleaner which kills germs and bonds with the skin to continue killing germs even after washing. Please DO NOT use if you have an allergy to CHG or antibacterial soaps.  If your skin becomes reddened/irritated stop using the CHG and inform your nurse when you arrive at Short Stay. Do not shave (including legs and underarms) for at least 48 hours prior to the first CHG shower.  You may shave your face/neck. Please follow these instructions carefully:  1.  Shower with CHG Soap the night before surgery and the  morning of Surgery.  2.  If you choose to wash your hair, wash your hair  first as usual with your  normal  shampoo.  3.  After you shampoo, rinse your hair and body thoroughly to remove the  shampoo.                           4.  Use CHG as you would any other liquid soap.  You can apply chg directly  to the skin and wash                       Gently with a scrungie or clean washcloth.  5.  Apply the CHG Soap to your body ONLY FROM THE NECK DOWN.   Do not use on face/ open  Wound or open sores. Avoid contact with eyes, ears mouth and genitals (private parts).                       Wash face,  Genitals (private parts) with your normal soap.             6.  Wash thoroughly, paying special attention to the area where your surgery  will be performed.  7.  Thoroughly rinse your body with warm water from the neck down.  8.  DO NOT shower/wash with your normal soap after using and rinsing off  the CHG Soap.                9.  Pat yourself dry with a clean towel.            10.  Wear clean pajamas.            11.  Place clean sheets on your bed the night of your first shower and do not  sleep with pets. Day of Surgery : Do not apply any lotions/deodorants the morning of surgery.  Please wear clean clothes to the hospital/surgery center.  FAILURE TO FOLLOW THESE INSTRUCTIONS MAY RESULT IN THE CANCELLATION OF YOUR SURGERY PATIENT SIGNATURE_________________________________  NURSE SIGNATURE__________________________________  ________________________________________________________________________  WHAT IS A BLOOD TRANSFUSION? Blood Transfusion Information  A transfusion is the replacement of blood or some of its parts. Blood is made up of multiple cells which provide different functions.  Red blood cells carry oxygen and are used for blood loss replacement.  White blood cells fight against infection.  Platelets control bleeding.  Plasma helps clot blood.  Other blood products are available for specialized needs, such as hemophilia or other  clotting disorders. BEFORE THE TRANSFUSION  Who gives blood for transfusions?   Healthy volunteers who are fully evaluated to make sure their blood is safe. This is blood bank blood. Transfusion therapy is the safest it has ever been in the practice of medicine. Before blood is taken from a donor, a complete history is taken to make sure that person has no history of diseases nor engages in risky social behavior (examples are intravenous drug use or sexual activity with multiple partners). The donor's travel history is screened to minimize risk of transmitting infections, such as malaria. The donated blood is tested for signs of infectious diseases, such as HIV and hepatitis. The blood is then tested to be sure it is compatible with you in order to minimize the chance of a transfusion reaction. If you or a relative donates blood, this is often done in anticipation of surgery and is not appropriate for emergency situations. It takes many days to process the donated blood. RISKS AND COMPLICATIONS Although transfusion therapy is very safe and saves many lives, the main dangers of transfusion include:   Getting an infectious disease.  Developing a transfusion reaction. This is an allergic reaction to something in the blood you were given. Every precaution is taken to prevent this. The decision to have a blood transfusion has been considered carefully by your caregiver before blood is given. Blood is not given unless the benefits outweigh the risks. AFTER THE TRANSFUSION  Right after receiving a blood transfusion, you will usually feel much better and more energetic. This is especially true if your red blood cells have gotten low (anemic). The transfusion raises the level of the red blood cells which carry oxygen, and this usually causes an energy increase.  The  nurse administering the transfusion will monitor you carefully for complications. HOME CARE INSTRUCTIONS  No special instructions are needed  after a transfusion. You may find your energy is better. Speak with your caregiver about any limitations on activity for underlying diseases you may have. SEEK MEDICAL CARE IF:   Your condition is not improving after your transfusion.  You develop redness or irritation at the intravenous (IV) site. SEEK IMMEDIATE MEDICAL CARE IF:  Any of the following symptoms occur over the next 12 hours:  Shaking chills.  You have a temperature by mouth above 102 F (38.9 C), not controlled by medicine.  Chest, back, or muscle pain.  People around you feel you are not acting correctly or are confused.  Shortness of breath or difficulty breathing.  Dizziness and fainting.  You get a rash or develop hives.  You have a decrease in urine output.  Your urine turns a dark color or changes to pink, red, or brown. Any of the following symptoms occur over the next 10 days:  You have a temperature by mouth above 102 F (38.9 C), not controlled by medicine.  Shortness of breath.  Weakness after normal activity.  The white part of the eye turns yellow (jaundice).  You have a decrease in the amount of urine or are urinating less often.  Your urine turns a dark color or changes to pink, red, or brown. Document Released: 06/27/2000 Document Revised: 09/22/2011 Document Reviewed: 02/14/2008 ExitCare Patient Information 2014 [place].  _______________________________________________________________________  Incentive Spirometer  An incentive spirometer is a tool that can help keep your lungs clear and active. This tool measures how well you are filling your lungs with each breath. Taking long deep breaths may help reverse or decrease the chance of developing breathing (pulmonary) problems (especially infection) following:  A long period of time when you are unable to move or be active. BEFORE THE PROCEDURE   If the spirometer includes an indicator to show your best effort, your nurse or  respiratory therapist will set it to a desired goal.  If possible, sit up straight or lean slightly forward. Try not to slouch.  Hold the incentive spirometer in an upright position. INSTRUCTIONS FOR USE  1. Sit on the edge of your bed if possible, or sit up as far as you can in bed or on a chair. 2. Hold the incentive spirometer in an upright position. 3. Breathe out normally. 4. Place the mouthpiece in your mouth and seal your lips tightly around it. 5. Breathe in slowly and as deeply as possible, raising the piston or the ball toward the top of the column. 6. Hold your breath for 3-5 seconds or for as long as possible. Allow the piston or ball to fall to the bottom of the column. 7. Remove the mouthpiece from your mouth and breathe out normally. 8. Rest for a few seconds and repeat Steps 1 through 7 at least 10 times every 1-2 hours when you are awake. Take your time and take a few normal breaths between deep breaths. 9. The spirometer may include an indicator to show your best effort. Use the indicator as a goal to work toward during each repetition. 10. After each set of 10 deep breaths, practice coughing to be sure your lungs are clear. If you have an incision (the cut made at the time of surgery), support your incision when coughing by placing a pillow or rolled up towels firmly against it. Once you are able to get  out of bed, walk around indoors and cough well. You may stop using the incentive spirometer when instructed by your caregiver.  RISKS AND COMPLICATIONS  Take your time so you do not get dizzy or light-headed.  If you are in pain, you may need to take or ask for pain medication before doing incentive spirometry. It is harder to take a deep breath if you are having pain. AFTER USE  Rest and breathe slowly and easily.  It can be helpful to keep track of a log of your progress. Your caregiver can provide you with a simple table to help with this. If you are using the  spirometer at home, follow these instructions: [place] IF:   You are having difficultly using the spirometer.  You have trouble using the spirometer as often as instructed.  Your pain medication is not giving enough relief while using the spirometer.  You develop fever of 100.5 F (38.1 C) or higher. SEEK IMMEDIATE MEDICAL CARE IF:   You cough up bloody sputum that had not been present before.  You develop fever of 102 F (38.9 C) or greater.  You develop worsening pain at or near the incision site. MAKE SURE YOU:   Understand these instructions.  Will watch your condition.  Will get help right away if you are not doing well or get worse. Document Released: 11/10/2006 Document Revised: 09/22/2011 Document Reviewed: 01/11/2007 [facility] Patient Information 2014 [place], [place].   ________________________________________________________________________

## 2017-12-30 NOTE — Anesthesia Postprocedure Evaluation (Signed)
Anesthesia Post Note  Patient: Devin Mercado. Usman  Procedure(s) Performed: COLONOSCOPY WITH PROPOFOL (N/A ) POLYPECTOMY     Patient location during evaluation: PACU Anesthesia Type: MAC Level of consciousness: awake and alert Pain management: pain level controlled Vital Signs Assessment: post-procedure vital signs reviewed and stable Respiratory status: spontaneous breathing, nonlabored ventilation, respiratory function stable and patient connected to nasal cannula oxygen Cardiovascular status: stable and blood pressure returned to baseline Postop Assessment: no apparent nausea or vomiting Anesthetic complications: no    Last Vitals:  Vitals:   12/30/17 1410 12/30/17 1420  BP: 125/81   Pulse: 80 70  Resp: 18 17  Temp:    SpO2: 99% 98%    Last Pain:  Vitals:   12/30/17 1420  TempSrc:   PainSc: 0-No pain                 Naileah Karg DAVID

## 2017-12-30 NOTE — Transfer of Care (Signed)
Immediate Anesthesia Transfer of Care Note  Patient: Devin Mercado. [name]  Procedure(s) Performed: COLONOSCOPY WITH PROPOFOL (N/A ) POLYPECTOMY  Patient Location: PACU  Anesthesia Type:MAC  Level of Consciousness: awake, alert  and oriented  Airway & Oxygen Therapy: Patient Spontanous Breathing and Patient connected to face mask oxygen  Post-op Assessment: Report given to RN and Post -op Vital signs reviewed and stable  Post vital signs: Reviewed and stable  Last Vitals:  Vitals Value Taken Time  BP    Temp    Pulse    Resp    SpO2      Last Pain:  Vitals:   12/30/17 1120  TempSrc: Oral  PainSc: 0-No pain         Complications: No apparent anesthesia complications

## 2017-12-30 NOTE — Discharge Instructions (Signed)

## 2017-12-30 NOTE — Anesthesia Preprocedure Evaluation (Addendum)
Anesthesia Evaluation  Patient identified by MRN, date of birth, ID band Patient awake    Reviewed: Allergy & Precautions, NPO status , Patient's Chart, lab work & pertinent test results  Airway Mallampati: I  TM Distance: >3 FB Neck ROM: Full    Dental   Pulmonary    Pulmonary exam normal        Cardiovascular hypertension, Pt. on medications Normal cardiovascular exam+ dysrhythmias + pacemaker      Neuro/Psych    GI/Hepatic   Endo/Other    Renal/GU      Musculoskeletal   Abdominal   Peds  Hematology   Anesthesia Other Findings   Reproductive/Obstetrics                             Anesthesia Physical Anesthesia Plan  ASA: III  Anesthesia Plan: MAC   Post-op Pain Management:    Induction: Intravenous  PONV Risk Score and Plan: 1 and Treatment may vary due to age or medical condition  Airway Management Planned: Simple Face Mask  Additional Equipment:   Intra-op Plan:   Post-operative Plan:   Informed Consent: I have reviewed the patients History and Physical, chart, labs and discussed the procedure including the risks, benefits and alternatives for the proposed anesthesia with the patient or authorized representative who has indicated his/her understanding and acceptance.     Plan Discussed with: CRNA and Surgeon  Anesthesia Plan Comments:         Anesthesia Quick Evaluation

## 2017-12-31 ENCOUNTER — Encounter: Payer: Self-pay | Admitting: Gastroenterology

## 2017-12-31 ENCOUNTER — Encounter (HOSPITAL_COMMUNITY)
Admission: RE | Admit: 2017-12-31 | Discharge: 2017-12-31 | Disposition: A | Discharge status: Home / Self Care | Payer: Medicare Other | Source: Ambulatory Visit | Attending: Orthopedic Surgery | Admitting: Orthopedic Surgery

## 2017-12-31 ENCOUNTER — Encounter (HOSPITAL_COMMUNITY): Payer: Self-pay

## 2017-12-31 ENCOUNTER — Other Ambulatory Visit: Payer: Self-pay

## 2017-12-31 DIAGNOSIS — Z01812 Encounter for preprocedural laboratory examination: Secondary | ICD-10-CM | POA: Diagnosis present

## 2017-12-31 LAB — BASIC METABOLIC PANEL
Anion gap: 10 (ref 5–15)
BUN: 34 mg/dL — ABNORMAL HIGH (ref 6–20)
BUN: 34 — AB (ref 4–21)
CALCIUM: 9.6 mg/dL (ref 8.9–10.3)
CO2: 23 mmol/L (ref 22–32)
CREATININE: 1.35 mg/dL — AB (ref 0.61–1.24)
CREATININE: 1.4 — AB (ref 0.6–1.3)
Chloride: 107 mmol/L (ref 101–111)
GFR, EST NON AFRICAN AMERICAN: 52 mL/min — AB (ref >60)
Glucose, Bld: 114 mg/dL — ABNORMAL HIGH (ref 65–99)
Glucose: 114
Potassium: 4.2 (ref 3.4–5.3)
Potassium: 4.2 mmol/L (ref 3.5–5.1)
SODIUM: 140 mmol/L (ref 135–145)
Sodium: 140 (ref 137–147)

## 2017-12-31 LAB — CBC
HCT: 42.1 % (ref 39.0–52.0)
Hemoglobin: 14.7 g/dL (ref 13.0–17.0)
MCH: 30.7 pg (ref 26.0–34.0)
MCHC: 34.9 g/dL (ref 30.0–36.0)
MCV: 87.9 fL (ref 78.0–100.0)
PLATELETS: 206 10*3/uL (ref 150–400)
RBC: 4.79 MIL/uL (ref 4.22–5.81)
RDW: 13.5 % (ref 11.5–15.5)
WBC: 6.5 10*3/uL (ref 4.0–10.5)

## 2017-12-31 LAB — SURGICAL PCR SCREEN
MRSA, PCR: NEGATIVE
STAPHYLOCOCCUS AUREUS: NEGATIVE

## 2017-12-31 NOTE — Progress Notes (Addendum)
Clearance and lov  Dr. Lovena Le 12-23-17 epic 12-28-17 ekg epic 2017 last device check epic

## 2017-12-31 NOTE — Progress Notes (Signed)
BMP done 12-31-17 routed to Dr. Alvan Dame via epic

## 2018-01-01 ENCOUNTER — Encounter (HOSPITAL_COMMUNITY): Payer: Self-pay | Admitting: Gastroenterology

## 2018-01-04 MED ORDER — TRANEXAMIC ACID 1000 MG/10ML IV SOLN
1000.0000 mg | INTRAVENOUS | Status: AC
  Administered 2018-01-05: 1000 mg via INTRAVENOUS
  Filled 2018-01-04: qty 1100

## 2018-01-05 ENCOUNTER — Inpatient Hospital Stay (HOSPITAL_COMMUNITY): Payer: Medicare Other | Admitting: Registered Nurse

## 2018-01-05 ENCOUNTER — Other Ambulatory Visit: Payer: Self-pay

## 2018-01-05 ENCOUNTER — Encounter (HOSPITAL_COMMUNITY)
Admission: RE | Disposition: A | Discharge status: Home / Self Care | Payer: Self-pay | Source: Ambulatory Visit | Attending: Orthopedic Surgery

## 2018-01-05 ENCOUNTER — Inpatient Hospital Stay (HOSPITAL_COMMUNITY)
Admission: RE | Admit: 2018-01-05 | Discharge: 2018-01-06 | DRG: 470 | Disposition: A | Discharge status: Home / Self Care | Payer: Medicare Other | Source: Ambulatory Visit | Attending: Orthopedic Surgery | Admitting: Orthopedic Surgery

## 2018-01-05 ENCOUNTER — Encounter: Payer: Self-pay | Admitting: Family Medicine

## 2018-01-05 ENCOUNTER — Encounter (HOSPITAL_COMMUNITY): Payer: Self-pay

## 2018-01-05 ENCOUNTER — Inpatient Hospital Stay (HOSPITAL_COMMUNITY): Payer: Medicare Other

## 2018-01-05 DIAGNOSIS — I10 Essential (primary) hypertension: Secondary | ICD-10-CM | POA: Diagnosis present

## 2018-01-05 DIAGNOSIS — Z8546 Personal history of malignant neoplasm of prostate: Secondary | ICD-10-CM | POA: Diagnosis not present

## 2018-01-05 DIAGNOSIS — M1611 Unilateral primary osteoarthritis, right hip: Principal | ICD-10-CM | POA: Diagnosis present

## 2018-01-05 DIAGNOSIS — Z96641 Presence of right artificial hip joint: Secondary | ICD-10-CM

## 2018-01-05 DIAGNOSIS — Z7982 Long term (current) use of aspirin: Secondary | ICD-10-CM | POA: Diagnosis not present

## 2018-01-05 DIAGNOSIS — E669 Obesity, unspecified: Secondary | ICD-10-CM | POA: Diagnosis present

## 2018-01-05 DIAGNOSIS — Z79899 Other long term (current) drug therapy: Secondary | ICD-10-CM | POA: Diagnosis not present

## 2018-01-05 DIAGNOSIS — Z950 Presence of cardiac pacemaker: Secondary | ICD-10-CM

## 2018-01-05 DIAGNOSIS — Z96649 Presence of unspecified artificial hip joint: Secondary | ICD-10-CM

## 2018-01-05 DIAGNOSIS — M62838 Other muscle spasm: Secondary | ICD-10-CM | POA: Diagnosis present

## 2018-01-05 DIAGNOSIS — I442 Atrioventricular block, complete: Secondary | ICD-10-CM | POA: Diagnosis present

## 2018-01-05 DIAGNOSIS — E785 Hyperlipidemia, unspecified: Secondary | ICD-10-CM | POA: Diagnosis present

## 2018-01-05 DIAGNOSIS — Z6830 Body mass index (BMI) 30.0-30.9, adult: Secondary | ICD-10-CM | POA: Diagnosis not present

## 2018-01-05 DIAGNOSIS — Z96642 Presence of left artificial hip joint: Secondary | ICD-10-CM | POA: Diagnosis present

## 2018-01-05 HISTORY — PX: TOTAL HIP ARTHROPLASTY: SHX124

## 2018-01-05 LAB — TYPE AND SCREEN
ABO/RH(D): A POS
ANTIBODY SCREEN: NEGATIVE

## 2018-01-05 SURGERY — ARTHROPLASTY, HIP, TOTAL, ANTERIOR APPROACH
Anesthesia: Spinal | Site: Hip | Laterality: Right

## 2018-01-05 MED ORDER — POLYETHYLENE GLYCOL 3350 17 G PO PACK: 17.0000 g | PACK | Freq: Two times a day (BID) | ORAL | 0 refills | Status: DC

## 2018-01-05 MED ORDER — CHLORHEXIDINE GLUCONATE 4 % EX LIQD: 60.0000 mL | Freq: Once | CUTANEOUS | Status: DC

## 2018-01-05 MED ORDER — DOCUSATE SODIUM 100 MG PO CAPS
100.0000 mg | ORAL_CAPSULE | Freq: Two times a day (BID) | ORAL | Status: DC
  Administered 2018-01-05 – 2018-01-06 (×2): 100 mg via ORAL
  Filled 2018-01-05 (×2): qty 1

## 2018-01-05 MED ORDER — MIDAZOLAM HCL 5 MG/5ML IJ SOLN
INTRAMUSCULAR | Status: DC | PRN
  Administered 2018-01-05: 2 mg via INTRAVENOUS

## 2018-01-05 MED ORDER — LACTATED RINGERS IV SOLN
INTRAVENOUS | Status: DC
  Administered 2018-01-05: 07:00:00 via INTRAVENOUS

## 2018-01-05 MED ORDER — ASPIRIN 81 MG PO CHEW: 81.0000 mg | CHEWABLE_TABLET | Freq: Two times a day (BID) | ORAL | 0 refills | Status: AC

## 2018-01-05 MED ORDER — HYDROCODONE-ACETAMINOPHEN 7.5-325 MG PO TABS: 1.0000 | ORAL_TABLET | ORAL | 0 refills | Status: DC | PRN

## 2018-01-05 MED ORDER — ACETAMINOPHEN 325 MG PO TABS: 325.0000 mg | ORAL_TABLET | Freq: Four times a day (QID) | ORAL | Status: DC | PRN

## 2018-01-05 MED ORDER — FENTANYL CITRATE (PF) 100 MCG/2ML IJ SOLN: 25.0000 ug | INTRAMUSCULAR | Status: DC | PRN

## 2018-01-05 MED ORDER — PROPOFOL 10 MG/ML IV BOLUS
INTRAVENOUS | Status: AC
  Filled 2018-01-05: qty 20

## 2018-01-05 MED ORDER — TRANEXAMIC ACID 1000 MG/10ML IV SOLN
1000.0000 mg | Freq: Once | INTRAVENOUS | Status: AC
  Administered 2018-01-05: 1000 mg via INTRAVENOUS
  Filled 2018-01-05: qty 10

## 2018-01-05 MED ORDER — POLYETHYLENE GLYCOL 3350 17 G PO PACK
17.0000 g | PACK | Freq: Two times a day (BID) | ORAL | Status: DC
  Administered 2018-01-05 – 2018-01-06 (×2): 17 g via ORAL
  Filled 2018-01-05 (×2): qty 1

## 2018-01-05 MED ORDER — FERROUS SULFATE 325 (65 FE) MG PO TABS: 325.0000 mg | ORAL_TABLET | Freq: Three times a day (TID) | ORAL | 3 refills | Status: DC

## 2018-01-05 MED ORDER — MIDAZOLAM HCL 2 MG/2ML IJ SOLN
INTRAMUSCULAR | Status: AC
  Filled 2018-01-05: qty 2

## 2018-01-05 MED ORDER — DEXAMETHASONE SODIUM PHOSPHATE 10 MG/ML IJ SOLN
10.0000 mg | Freq: Once | INTRAMUSCULAR | Status: AC
  Administered 2018-01-06: 10 mg via INTRAVENOUS
  Filled 2018-01-05: qty 1

## 2018-01-05 MED ORDER — HYDROCODONE-ACETAMINOPHEN 7.5-325 MG PO TABS
1.0000 | ORAL_TABLET | ORAL | Status: DC | PRN
  Administered 2018-01-05: 1 via ORAL
  Filled 2018-01-05: qty 1

## 2018-01-05 MED ORDER — SODIUM CHLORIDE 0.9 % IR SOLN
Status: DC | PRN
Start: 2018-01-05 — End: 2018-01-05
  Administered 2018-01-05: 1000 mL

## 2018-01-05 MED ORDER — ONDANSETRON HCL 4 MG PO TABS: 4.0000 mg | ORAL_TABLET | Freq: Four times a day (QID) | ORAL | Status: DC | PRN

## 2018-01-05 MED ORDER — ATORVASTATIN CALCIUM 20 MG PO TABS
20.0000 mg | ORAL_TABLET | Freq: Every day | ORAL | Status: DC
  Administered 2018-01-05 – 2018-01-06 (×2): 20 mg via ORAL
  Filled 2018-01-05 (×2): qty 1

## 2018-01-05 MED ORDER — PHENYLEPHRINE 40 MCG/ML (10ML) SYRINGE FOR IV PUSH (FOR BLOOD PRESSURE SUPPORT)
PREFILLED_SYRINGE | INTRAVENOUS | Status: AC
  Filled 2018-01-05: qty 10

## 2018-01-05 MED ORDER — METHOCARBAMOL 1000 MG/10ML IJ SOLN
500.0000 mg | Freq: Four times a day (QID) | INTRAVENOUS | Status: DC | PRN
  Filled 2018-01-05: qty 5

## 2018-01-05 MED ORDER — SODIUM CHLORIDE 0.9 % IV SOLN
INTRAVENOUS | Status: DC
  Administered 2018-01-05: 12:00:00 via INTRAVENOUS
  Administered 2018-01-06: 1 mL via INTRAVENOUS

## 2018-01-05 MED ORDER — PHENOL 1.4 % MT LIQD: 1.0000 | OROMUCOSAL | Status: DC | PRN

## 2018-01-05 MED ORDER — FENTANYL CITRATE (PF) 100 MCG/2ML IJ SOLN
INTRAMUSCULAR | Status: DC | PRN
  Administered 2018-01-05: 50 ug via INTRAVENOUS

## 2018-01-05 MED ORDER — DEXAMETHASONE SODIUM PHOSPHATE 10 MG/ML IJ SOLN
INTRAMUSCULAR | Status: AC
  Filled 2018-01-05: qty 1

## 2018-01-05 MED ORDER — ONDANSETRON HCL 4 MG/2ML IJ SOLN: 4.0000 mg | Freq: Four times a day (QID) | INTRAMUSCULAR | Status: DC | PRN

## 2018-01-05 MED ORDER — CEFAZOLIN SODIUM-DEXTROSE 2-4 GM/100ML-% IV SOLN
2.0000 g | Freq: Four times a day (QID) | INTRAVENOUS | Status: AC
  Administered 2018-01-05 (×2): 2 g via INTRAVENOUS
  Filled 2018-01-05 (×2): qty 100

## 2018-01-05 MED ORDER — FENTANYL CITRATE (PF) 100 MCG/2ML IJ SOLN
INTRAMUSCULAR | Status: AC
  Filled 2018-01-05: qty 2

## 2018-01-05 MED ORDER — METOCLOPRAMIDE HCL 5 MG PO TABS: 5.0000 mg | ORAL_TABLET | Freq: Three times a day (TID) | ORAL | Status: DC | PRN

## 2018-01-05 MED ORDER — METHOCARBAMOL 500 MG PO TABS
500.0000 mg | ORAL_TABLET | Freq: Four times a day (QID) | ORAL | Status: DC | PRN
  Administered 2018-01-05 – 2018-01-06 (×3): 500 mg via ORAL
  Filled 2018-01-05 (×3): qty 1

## 2018-01-05 MED ORDER — CEFAZOLIN SODIUM-DEXTROSE 2-4 GM/100ML-% IV SOLN
2.0000 g | INTRAVENOUS | Status: AC
  Administered 2018-01-05: 2 g via INTRAVENOUS
  Filled 2018-01-05: qty 100

## 2018-01-05 MED ORDER — MORPHINE SULFATE (PF) 2 MG/ML IV SOLN: 0.5000 mg | INTRAVENOUS | Status: DC | PRN

## 2018-01-05 MED ORDER — ALUM & MAG HYDROXIDE-SIMETH 200-200-20 MG/5ML PO SUSP: 15.0000 mL | ORAL | Status: DC | PRN

## 2018-01-05 MED ORDER — MAGNESIUM CITRATE PO SOLN: 1.0000 | Freq: Once | ORAL | Status: DC | PRN

## 2018-01-05 MED ORDER — METHOCARBAMOL 500 MG PO TABS: 500.0000 mg | ORAL_TABLET | Freq: Four times a day (QID) | ORAL | 0 refills | Status: DC | PRN

## 2018-01-05 MED ORDER — HYDROCODONE-ACETAMINOPHEN 5-325 MG PO TABS: 1.0000 | ORAL_TABLET | ORAL | Status: DC | PRN

## 2018-01-05 MED ORDER — PROPOFOL 500 MG/50ML IV EMUL
INTRAVENOUS | Status: DC | PRN
  Administered 2018-01-05: 40 ug/kg/min via INTRAVENOUS

## 2018-01-05 MED ORDER — ONDANSETRON HCL 4 MG/2ML IJ SOLN
INTRAMUSCULAR | Status: AC
  Filled 2018-01-05: qty 2

## 2018-01-05 MED ORDER — DIPHENHYDRAMINE HCL 12.5 MG/5ML PO ELIX: 12.5000 mg | ORAL_SOLUTION | ORAL | Status: DC | PRN

## 2018-01-05 MED ORDER — DOCUSATE SODIUM 100 MG PO CAPS: 100.0000 mg | ORAL_CAPSULE | Freq: Two times a day (BID) | ORAL | 0 refills | Status: DC

## 2018-01-05 MED ORDER — PHENYLEPHRINE 40 MCG/ML (10ML) SYRINGE FOR IV PUSH (FOR BLOOD PRESSURE SUPPORT)
PREFILLED_SYRINGE | INTRAVENOUS | Status: DC | PRN
  Administered 2018-01-05 (×5): 80 ug via INTRAVENOUS

## 2018-01-05 MED ORDER — FERROUS SULFATE 325 (65 FE) MG PO TABS
325.0000 mg | ORAL_TABLET | Freq: Three times a day (TID) | ORAL | Status: DC
  Administered 2018-01-05 – 2018-01-06 (×3): 325 mg via ORAL
  Filled 2018-01-05 (×3): qty 1

## 2018-01-05 MED ORDER — BUPIVACAINE IN DEXTROSE 0.75-8.25 % IT SOLN
INTRATHECAL | Status: DC | PRN
  Administered 2018-01-05: 2 mL via INTRATHECAL

## 2018-01-05 MED ORDER — PROPOFOL 10 MG/ML IV BOLUS
INTRAVENOUS | Status: AC
  Filled 2018-01-05: qty 40

## 2018-01-05 MED ORDER — DEXAMETHASONE SODIUM PHOSPHATE 10 MG/ML IJ SOLN
10.0000 mg | Freq: Once | INTRAMUSCULAR | Status: AC
  Administered 2018-01-05: 10 mg via INTRAVENOUS

## 2018-01-05 MED ORDER — METOCLOPRAMIDE HCL 5 MG/ML IJ SOLN: 5.0000 mg | Freq: Three times a day (TID) | INTRAMUSCULAR | Status: DC | PRN

## 2018-01-05 MED ORDER — HYDROCHLOROTHIAZIDE 25 MG PO TABS
12.5000 mg | ORAL_TABLET | Freq: Every day | ORAL | Status: DC
  Administered 2018-01-06: 12.5 mg via ORAL
  Filled 2018-01-05: qty 1

## 2018-01-05 MED ORDER — MENTHOL 3 MG MT LOZG: 1.0000 | LOZENGE | OROMUCOSAL | Status: DC | PRN

## 2018-01-05 MED ORDER — ONDANSETRON HCL 4 MG/2ML IJ SOLN
INTRAMUSCULAR | Status: DC | PRN
  Administered 2018-01-05: 4 mg via INTRAVENOUS

## 2018-01-05 MED ORDER — CELECOXIB 200 MG PO CAPS
200.0000 mg | ORAL_CAPSULE | Freq: Two times a day (BID) | ORAL | Status: DC
  Administered 2018-01-05 – 2018-01-06 (×3): 200 mg via ORAL
  Filled 2018-01-05 (×3): qty 1

## 2018-01-05 MED ORDER — ASPIRIN 81 MG PO CHEW
81.0000 mg | CHEWABLE_TABLET | Freq: Two times a day (BID) | ORAL | Status: DC
  Administered 2018-01-05 – 2018-01-06 (×2): 81 mg via ORAL
  Filled 2018-01-05 (×2): qty 1

## 2018-01-05 MED ORDER — BISACODYL 10 MG RE SUPP
10.0000 mg | Freq: Every day | RECTAL | Status: DC | PRN
Start: 2018-01-05 — End: 2018-01-06

## 2018-01-05 SURGICAL SUPPLY — 34 items
BAG DECANTER FOR FLEXI CONT (MISCELLANEOUS) IMPLANT
BAG ZIPLOCK 12X15 (MISCELLANEOUS) IMPLANT
BLADE SAG 18X100X1.27 (BLADE) ×3 IMPLANT
CAPT HIP TOTAL 2 ×3 IMPLANT
COVER PERINEAL POST (MISCELLANEOUS) ×3 IMPLANT
COVER SURGICAL LIGHT HANDLE (MISCELLANEOUS) ×3 IMPLANT
DERMABOND ADVANCED (GAUZE/BANDAGES/DRESSINGS) ×2
DERMABOND ADVANCED .7 DNX12 (GAUZE/BANDAGES/DRESSINGS) ×1 IMPLANT
DRAPE STERI IOBAN 125X83 (DRAPES) ×3 IMPLANT
DRAPE U-SHAPE 47X51 STRL (DRAPES) ×6 IMPLANT
DRESSING AQUACEL AG SP 3.5X10 (GAUZE/BANDAGES/DRESSINGS) ×1 IMPLANT
DRSG AQUACEL AG SP 3.5X10 (GAUZE/BANDAGES/DRESSINGS) ×3
DURAPREP 26ML APPLICATOR (WOUND CARE) ×3 IMPLANT
ELECT REM PT RETURN 15FT ADLT (MISCELLANEOUS) ×3 IMPLANT
GLOVE BIOGEL M STRL SZ7.5 (GLOVE) IMPLANT
GLOVE BIOGEL PI IND STRL 7.5 (GLOVE) ×1 IMPLANT
GLOVE BIOGEL PI IND STRL 8.5 (GLOVE) ×1 IMPLANT
GLOVE BIOGEL PI INDICATOR 7.5 (GLOVE) ×2
GLOVE BIOGEL PI INDICATOR 8.5 (GLOVE) ×2
GLOVE ECLIPSE 8.0 STRL XLNG CF (GLOVE) ×6 IMPLANT
GLOVE ORTHO TXT STRL SZ7.5 (GLOVE) ×6 IMPLANT
GOWN STRL REUS W/TWL 2XL LVL3 (GOWN DISPOSABLE) ×3 IMPLANT
GOWN STRL REUS W/TWL LRG LVL3 (GOWN DISPOSABLE) ×6 IMPLANT
HOLDER FOLEY CATH W/STRAP (MISCELLANEOUS) ×3 IMPLANT
PACK ANTERIOR HIP CUSTOM (KITS) ×3 IMPLANT
SUT MNCRL AB 4-0 PS2 18 (SUTURE) ×3 IMPLANT
SUT STRATAFIX 0 PDS 27 VIOLET (SUTURE) ×3
SUT VIC AB 1 CT1 36 (SUTURE) ×9 IMPLANT
SUT VIC AB 2-0 CT1 27 (SUTURE) ×4
SUT VIC AB 2-0 CT1 TAPERPNT 27 (SUTURE) ×2 IMPLANT
SUTURE STRATFX 0 PDS 27 VIOLET (SUTURE) ×1 IMPLANT
TRAY FOLEY MTR SLVR 16FR STAT (SET/KITS/TRAYS/PACK) ×3 IMPLANT
WATER STERILE IRR 1000ML POUR (IV SOLUTION) ×6 IMPLANT
YANKAUER SUCT BULB TIP 10FT TU (MISCELLANEOUS) ×3 IMPLANT

## 2018-01-05 NOTE — Anesthesia Postprocedure Evaluation (Signed)
Anesthesia Post Note  Patient: Devin Mercado  Procedure(s) Performed: RIGHT TOTAL HIP ARTHROPLASTY ANTERIOR APPROACH (Right Hip)     Patient location during evaluation: PACU Anesthesia Type: Spinal Level of consciousness: oriented and awake and alert Pain management: pain level controlled Vital Signs Assessment: post-procedure vital signs reviewed and stable Respiratory status: spontaneous breathing, respiratory function stable and patient connected to nasal cannula oxygen Cardiovascular status: blood pressure returned to baseline and stable Postop Assessment: no headache, no backache, no apparent nausea or vomiting, patient able to bend at knees and spinal receding Anesthetic complications: no    Last Vitals:  Vitals:   01/05/18 1130 01/05/18 1145  BP: 111/76 121/74  Pulse: 60 (!) 58  Resp: 11 14  Temp:  36.5 C  SpO2: 100% 100%    Last Pain:  Vitals:   01/05/18 1145  TempSrc:   PainSc: Asleep                 Indiya Izquierdo,W. EDMOND

## 2018-01-05 NOTE — Anesthesia Preprocedure Evaluation (Addendum)
Anesthesia Evaluation  Patient identified by MRN, date of birth, ID band Patient awake    Reviewed: Allergy & Precautions, H&P , NPO status , Patient's Chart, lab work & pertinent test results  Airway Mallampati: II  TM Distance: >3 FB Neck ROM: Full    Dental no notable dental hx. (+) Teeth Intact, Dental Advisory Given   Pulmonary neg pulmonary ROS,    Pulmonary exam normal breath sounds clear to auscultation       Cardiovascular hypertension, Pt. on medications + dysrhythmias + pacemaker  Rhythm:Regular Rate:Normal     Neuro/Psych negative neurological ROS  negative psych ROS   GI/Hepatic negative GI ROS, Neg liver ROS,   Endo/Other  negative endocrine ROS  Renal/GU negative Renal ROS  negative genitourinary   Musculoskeletal  (+) Arthritis , Osteoarthritis,    Abdominal   Peds  Hematology negative hematology ROS (+)   Anesthesia Other Findings   Reproductive/Obstetrics negative OB ROS                            Anesthesia Physical Anesthesia Plan  ASA: III  Anesthesia Plan: Spinal   Post-op Pain Management:    Induction: Intravenous  PONV Risk Score and Plan: 2 and Propofol infusion, Ondansetron and Dexamethasone  Airway Management Planned: Simple Face Mask  Additional Equipment:   Intra-op Plan:   Post-operative Plan:   Informed Consent: I have reviewed the patients History and Physical, chart, labs and discussed the procedure including the risks, benefits and alternatives for the proposed anesthesia with the patient or authorized representative who has indicated his/her understanding and acceptance.   Dental advisory given  Plan Discussed with: CRNA  Anesthesia Plan Comments:         Anesthesia Quick Evaluation

## 2018-01-05 NOTE — Evaluation (Signed)
Physical Therapy Evaluation Patient Details Name: Devin Mercado MRN: [id number] DOB: Jan 06, 1951 Today's Date: 01/05/2018   History of Present Illness   Patient is a 67 y/o male admitted for R direct anterior THA.  PMH positive for L THA, pacemaker, prostate CA.   Clinical Impression  Patient presents with decreased independence with mobility due to weakness, decreased ROM and pain.  Currently min A for mobility and able to ambulate good distance.  Will follow up next day with further therex, bed mobility practice and stairs prior to d/c home.     Follow Up Recommendations Follow surgeon's recommendation for DC plan and follow-up therapies    Equipment Recommendations  None recommended by PT    Recommendations for Other Services       Precautions / Restrictions Precautions Precautions: Fall Restrictions Weight Bearing Restrictions: No      Mobility  Bed Mobility Overal bed mobility: Needs Assistance Bed Mobility: Supine to Sit     Supine to sit: Min assist;HOB elevated     General bed mobility comments: heavy use of railings and assist for R LE  Transfers Overall transfer level: Needs assistance Equipment used: Rolling walker (2 wheeled) Transfers: Sit to/from Stand Sit to Stand: Min assist;From elevated surface         General transfer comment: elevated height of bed due to hips below knees initially, assist for balance to stand  Ambulation/Gait                Stairs            Wheelchair Mobility    Modified Rankin (Stroke Patients Only)       Balance Overall balance assessment: Needs assistance   Sitting balance-Leahy Scale: Good     Standing balance support: Bilateral upper extremity supported Standing balance-Leahy Scale: Poor                               Pertinent Vitals/Pain Pain Assessment: 0-10 Pain Score: 4  Pain Location: R hip Pain Descriptors / Indicators: Operative site guarding Pain Intervention(s):  Limited activity within patient's tolerance;Monitored during session;Repositioned    Home Living Family/patient expects to be discharged to:: Private residence Living Arrangements: Spouse/significant other Available Help at Discharge: Family Type of Home: House Home Access: Stairs to enter Entrance Stairs-Rails: None Entrance Stairs-Number of Steps: 3 Home Layout: One level Home Equipment: Environmental consultant - 2 wheels;Shower seat - built in;Bedside commode      Prior Function Level of Independence: Independent         Comments: works as Land        Extremity/Trunk Assessment   Upper Extremity Assessment Upper Extremity Assessment: Overall WFL for tasks assessed    Lower Extremity Assessment Lower Extremity Assessment: RLE deficits/detail RLE Deficits / Details: AAROM grossly WFL, strength hip flexion 3-/5, knee extension 3/5       Communication   Communication: No difficulties  Cognition Arousal/Alertness: Awake/alert Behavior During Therapy: WFL for tasks assessed/performed Overall Cognitive Status: Within Functional Limits for tasks assessed                                        General Comments      Exercises Total Joint Exercises Ankle Circles/Pumps: AROM;10 reps;Both;Supine Short Arc Quad: AROM;10 reps;Right;Supine Heel Slides: AAROM;Right;10 reps;Supine   Assessment/Plan  PT Assessment Patient needs continued PT services  PT Problem List Decreased strength;Decreased mobility;Decreased activity tolerance;Decreased balance;Decreased range of motion;Decreased knowledge of use of DME;Pain;Decreased safety awareness       PT Treatment Interventions DME instruction;Functional mobility training;Patient/family education;Therapeutic activities;Stair training;Therapeutic exercise;Gait training    PT Goals (Current goals can be found in the Care Plan section)  Acute Rehab PT Goals Patient Stated Goal: to go home, back to  exercise PT Goal Formulation: With patient/family Time For Goal Achievement: 01/15/18 Potential to Achieve Goals: Good    Frequency 7X/week   Barriers to discharge        Co-evaluation               AM-PAC PT "6 Clicks" Daily Activity  Outcome Measure Difficulty turning over in bed (including adjusting bedclothes, sheets and blankets)?: A Lot Difficulty moving from lying on back to sitting on the side of the bed? : Unable Difficulty sitting down on and standing up from a chair with arms (e.g., wheelchair, bedside commode, etc,.)?: Unable Help needed moving to and from a bed to chair (including a wheelchair)?: A Little Help needed walking in hospital room?: A Little Help needed climbing 3-5 steps with a railing? : A Lot 6 Click Score: 12    End of Session Equipment Utilized During Treatment: Gait belt Activity Tolerance: Patient tolerated treatment well Patient left: with call bell/phone within reach;in chair;with family/visitor present   PT Visit Diagnosis: Difficulty in walking, not elsewhere classified (R26.2);Pain Pain - Right/Left: Right Pain - part of body: Hip    Time: 0962-8366 PT Time Calculation (min) (ACUTE ONLY): 23 min   Charges:   PT Evaluation $PT Eval Low Complexity: 1 Low PT Treatments $Gait Training: 8-22 mins   PT G CodesMagda Kiel, [place] [phone] 01/05/2018   Reginia Naas 01/05/2018, 3:54 PM

## 2018-01-05 NOTE — Transfer of Care (Signed)
Immediate Anesthesia Transfer of Care Note  Patient: Devin Mercado  Procedure(s) Performed: RIGHT TOTAL HIP ARTHROPLASTY ANTERIOR APPROACH (Right Hip)  Patient Location: PACU  Anesthesia Type:Spinal  Level of Consciousness: awake, alert , oriented and patient cooperative  Airway & Oxygen Therapy: Patient Spontanous Breathing and Patient connected to face mask oxygen  Post-op Assessment: Report given to RN and Post -op Vital signs reviewed and stable  Post vital signs: Reviewed and stable  Last Vitals:  Vitals Value Taken Time  BP 108/66 01/05/2018 11:08 AM  Temp    Pulse 72 01/05/2018 11:10 AM  Resp 19 01/05/2018 11:10 AM  SpO2 100 % 01/05/2018 11:10 AM  Vitals shown include unvalidated device data.  Last Pain:  Vitals:   01/05/18 0721  TempSrc:   PainSc: 0-No pain         Complications: No apparent anesthesia complications

## 2018-01-05 NOTE — Discharge Instructions (Addendum)

## 2018-01-05 NOTE — Anesthesia Procedure Notes (Signed)
Spinal  Start time: 01/05/2018 9:31 AM End time: 01/05/2018 9:36 AM Staffing Resident/CRNA: Victoriano Lain, CRNA Preanesthetic Checklist Completed: patient identified, site marked, surgical consent, pre-op evaluation, timeout performed, IV checked, risks and benefits discussed and monitors and equipment checked Spinal Block Patient position: sitting Prep: site prepped and draped and DuraPrep Patient monitoring: cardiac monitor, continuous pulse ox and blood pressure Approach: midline Location: L3-4 Injection technique: single-shot Needle Needle type: Pencan  Needle gauge: 24 G Needle length: 10 cm Assessment Sensory level: T4 Additional Notes Pt placed in sitting position for spinal placement. Spinal kit expiration date checked and verified. One attempt by CRNA. - heme, + CSF. Pt tolerated well.

## 2018-01-05 NOTE — Op Note (Signed)
NAME:  Devin Mercado                ACCOUNT NO.: [id number]      MEDICAL RECORD NO.: [id number]      FACILITY:  [facility]      PHYSICIAN:  [name]  DATE OF BIRTH:  1951-05-11     DATE OF PROCEDURE:  01/05/2018                                 OPERATIVE REPORT         PREOPERATIVE DIAGNOSIS: Right  hip osteoarthritis.      POSTOPERATIVE DIAGNOSIS:  Right hip osteoarthritis.      PROCEDURE:  Right total hip replacement through an anterior approach   utilizing DePuy THR system, component size 110mm pinnacle cup, a size 36+4 neutral   Altrex liner, a size 9 Hi Actis femoral stem with a 36+5 delta ceramic   ball.      SURGEON:  Pietro Cassis. Alvan Dame, M.D.      ASSISTANT:  Danae Orleans, PA-C     ANESTHESIA:  Spinal.      SPECIMENS:  None.      COMPLICATIONS:  None.      BLOOD LOSS:  400 cc     DRAINS:  None.      INDICATION OF THE PROCEDURE:  Devin Mercado is a 67 y.o. male who had   presented to office for evaluation of right hip pain.  Radiographs revealed   progressive degenerative changes with bone-on-bone   articulation of the  hip joint, including subchondral cystic changes and osteophytes.  The patient had painful limited range of   motion significantly affecting their overall quality of life and function.  The patient was failing to    respond to conservative measures including medications and/or injections and activity modification and at this point was ready   to proceed with more definitive measures.  Consent was obtained for   benefit of pain relief.  Specific risks of infection, DVT, component   failure, dislocation, neurovascular injury, and need for revision surgery were reviewed in the office as well discussion of   the anterior versus posterior approach were reviewed.     PROCEDURE IN DETAIL:  The patient was brought to operative theater.   Once adequate anesthesia, preoperative antibiotics, 2 gm of Ancef, 1 gm of Tranexamic Acid,  and 10 mg of Decadron were administered, the patient was positioned supine on the [employer] table.  Once the patient was safely positioned with adequate padding of boney prominences we predraped out the hip, and used fluoroscopy to confirm orientation of the pelvis.      The right hip was then prepped and draped from proximal iliac crest to   mid thigh with a shower curtain technique.      Time-out was performed identifying the patient, planned procedure, and the appropriate extremity.     An incision was then made 2 cm lateral to the   anterior superior iliac spine extending over the orientation of the   tensor fascia lata muscle and sharp dissection was carried down to the   fascia of the muscle.      The fascia was then incised.  The muscle belly was identified and swept   laterally and retractor placed along the superior neck.  Following   cauterization of the circumflex vessels and removing some  pericapsular   fat, a second cobra retractor was placed on the inferior neck.  A T-capsulotomy was made along the line of the   superior neck to the trochanteric fossa, then extended proximally and   distally.  Tag sutures were placed and the retractors were then placed   intracapsular.  We then identified the trochanteric fossa and   orientation of my neck cut and then made a neck osteotomy with the femur on traction.  The femoral   head was removed without difficulty or complication.  Traction was let   off and retractors were placed posterior and anterior around the   acetabulum.      The labrum and foveal tissue were debrided.  I began reaming with a 48 mm   reamer and reamed up to 55 mm reamer with good bony bed preparation and a 56 mm  cup was chosen.  The final 56 mm Pinnacle cup was then impacted under fluoroscopy to confirm the depth of penetration and orientation with respect to   Abduction and forward flexion.  A screw was placed into the ilium followed by the hole eliminator.  The  final   36+4 neutral Altrex liner was impacted with good visualized rim fit.  The cup was positioned anatomically within the acetabular portion of the pelvis.      At this point, the femur was rolled to 100 degrees.  Further capsule was   released off the inferior aspect of the femoral neck.  I then   released the superior capsule proximally.  With the leg in a neutral position the hook was placed laterally   along the femur under the vastus lateralis origin and elevated manually and then held in position using the hook attachment on the bed.  The leg was then extended and adducted with the leg rolled to 100   degrees of external rotation.  Retractors were placed along the medial calcar and posteriorly over the greater trochanter.  Once the proximal femur was fully   exposed, I used a box osteotome to set orientation.  I then began   broaching with the starting chili pepper broach and passed this by hand and then broached up to 9.  With the 9 broach in place I chose a high offset neck and did several trial reductions.  The offset was appropriate, leg lengths   appeared to be equal best matched with the +5 head ball trial confirmed radiographically, matching his other hip previously replaced.   Given these findings, I went ahead and dislocated the hip, repositioned all   retractors and positioned the right hip in the extended and abducted position.  The final 9 Hi Actis femoral stem was   chosen and it was impacted down to the level of neck cut.  Based on this   and the trial reductions, a final 36+5 delta ceramic ball was chosen and   impacted onto a clean and dry trunnion, and the hip was reduced.  The   hip had been irrigated throughout the case again at this point.  I did   reapproximate the superior capsular leaflet to the anterior leaflet   using #1 Vicryl.  The fascia of the   tensor fascia lata muscle was then reapproximated using #1 Vicryl and #0 Stratafix sutures.  The   remaining wound  was closed with 2-0 Vicryl and running 4-0 Monocryl.   The hip was cleaned, dried, and dressed sterilely using Dermabond and   Aquacel dressing.  The  patient was then brought   to recovery room in stable condition tolerating the procedure well.    Danae Orleans, PA-C was present for the entirety of the case involved from   preoperative positioning, perioperative retractor management, general   facilitation of the case, as well as primary wound closure as assistant.            Pietro Cassis Alvan Dame, M.D.        01/05/2018 10:48 AM

## 2018-01-05 NOTE — Interval H&P Note (Signed)
History and Physical Interval Note:  01/05/2018 8:25 AM  Devin Mercado  has presented today for surgery, with the diagnosis of Right hip osteoarthritis  The various methods of treatment have been discussed with the patient and family. After consideration of risks, benefits and other options for treatment, the patient has consented to  Procedure(s) with comments: RIGHT TOTAL HIP ARTHROPLASTY ANTERIOR APPROACH (Right) - 70 mins as a surgical intervention .  The patient's history has been reviewed, patient examined, no change in status, stable for surgery.  I have reviewed the patient's chart and labs.  Questions were answered to the patient's satisfaction.     [name]

## 2018-01-06 LAB — BASIC METABOLIC PANEL
ANION GAP: 7 (ref 5–15)
BUN: 24 mg/dL — ABNORMAL HIGH (ref 8–23)
CHLORIDE: 106 mmol/L (ref 98–111)
CO2: 24 mmol/L (ref 22–32)
Calcium: 8.1 mg/dL — ABNORMAL LOW (ref 8.9–10.3)
Creatinine, Ser: 1.09 mg/dL (ref 0.61–1.24)
GFR calc Af Amer: >60 mL/min (ref >60)
GFR calc non Af Amer: >60 mL/min (ref >60)
Glucose, Bld: 138 mg/dL — ABNORMAL HIGH (ref 70–99)
Potassium: 4.3 mmol/L (ref 3.5–5.1)
Sodium: 137 mmol/L (ref 135–145)

## 2018-01-06 LAB — CBC
HEMATOCRIT: 31.9 % — AB (ref 39.0–52.0)
HEMOGLOBIN: 11.1 g/dL — AB (ref 13.0–17.0)
MCH: 30.3 pg (ref 26.0–34.0)
MCHC: 34.8 g/dL (ref 30.0–36.0)
MCV: 87.2 fL (ref 78.0–100.0)
Platelets: 164 10*3/uL (ref 150–400)
RBC: 3.66 MIL/uL — ABNORMAL LOW (ref 4.22–5.81)
RDW: 13.2 % (ref 11.5–15.5)
WBC: 10.8 10*3/uL — AB (ref 4.0–10.5)

## 2018-01-06 MED FILL — METHOCARBAMOL 500 MG TABLET: 500 | 10 days supply | Qty: 40 | Fill #0

## 2018-01-06 MED FILL — HYDROCODON-APAP 7.5-325: 7.5-325 | 5 days supply | Qty: 60 | Fill #0

## 2018-01-06 NOTE — Progress Notes (Signed)
     Subjective: 1 Day Post-Op Procedure(s) (LRB): RIGHT TOTAL HIP ARTHROPLASTY ANTERIOR APPROACH (Right)   Patient reports pain as mild/moderate.  States that he is having muscle spasms in the hip, he doesn't remember these from the other hip surgery. Dr. Alvan Dame did discussed the muscle relaxer to help relieve these symptoms.  He also discussed using ice on the hip, but if needed can use heat on the muscle to also try to relieve muscle spasms.  Ready to be discharged home.     Objective:   VITALS:   Vitals:   01/06/18 0145 01/06/18 0518  BP: 117/74 114/72  Pulse: 65 60  Resp: 17 18  Temp: (!) 97.5 F (36.4 C) (!) 97.5 F (36.4 C)  SpO2: 98% 97%    Dorsiflexion/Plantar flexion intact Incision: dressing C/Mercado/I No cellulitis present Compartment soft  LABS Recent Labs    01/06/18 0557  HGB 11.1*  HCT 31.9*  WBC 10.8*  PLT 164    Recent Labs    01/06/18 0557  NA 137  K 4.3  BUN 24*  CREATININE 1.09  GLUCOSE 138*     Assessment/Plan: 1 Day Post-Op Procedure(s) (LRB): RIGHT TOTAL HIP ARTHROPLASTY ANTERIOR APPROACH (Right) Foley cath Mercado/c'ed Advance diet Up with therapy Mercado/C IV fluids Discharge home Follow up in 2 weeks at [facility] ([place]). Follow up with OLIN,Devin Mercado in 2 weeks.  Contact information:  [facility]) [address] [place] [phone]    Obese (BMI 30-39.9) Estimated body mass index is 30.96 kg/m as calculated from the following:   Height as of this encounter: 5\' 11"  (1.803 m).   Weight as of this encounter: 100.7 kg (222 lb). Patient also counseled that weight may inhibit the healing process Patient counseled that losing weight will help with future health issues      Devin Mercado   PAC  01/06/2018, 7:54 AM

## 2018-01-06 NOTE — Progress Notes (Signed)
Physical Therapy Treatment Patient Details Name: Devin Mercado MRN: [id number] DOB: 05-Apr-1951 Today's Date: 01/06/2018    History of Present Illness  Patient is a 67 y/o male admitted for R direct anterior THA.  PMH positive for L THA, pacemaker, prostate CA.     PT Comments    Patient progressing with mobility and able to achieve S level goals for ambulation and transfers and bed mobility with use of strap to aide movement of R LE.  Stairs at goal with min A for walker stabilization.  Feel he is stable for d/c home and HEP issued and carefully reviewed with pt.  Will sign off as planned d/c home today.   Follow Up Recommendations  Follow surgeon's recommendation for DC plan and follow-up therapies     Equipment Recommendations  None recommended by PT    Recommendations for Other Services       Precautions / Restrictions Precautions Precautions: Fall Restrictions Weight Bearing Restrictions: No    Mobility  Bed Mobility Overal bed mobility: Needs Assistance Bed Mobility: Supine to Sit;Sit to Supine     Supine to sit: Supervision Sit to supine: Supervision   General bed mobility comments: no rails, HOB flat; used strap to assist R LE on/off bed  Transfers   Equipment used: Rolling walker (2 wheeled) Transfers: Sit to/from Stand Sit to Stand: Supervision         General transfer comment: bed height simulated bed at home  Ambulation/Gait Ambulation/Gait assistance: Supervision Gait Distance (Feet): 200 Feet Assistive device: Rolling walker (2 wheeled) Gait Pattern/deviations: Step-to pattern;Step-through pattern;Trunk flexed     General Gait Details: initially more flexed, as ambulating able to increase trunk extension    Stairs Stairs: Yes Stairs assistance: Min assist Stair Management: With walker;Backwards;Step to pattern Number of Stairs: 2 General stair comments: cues for sequence, technique, min A to stabilize walker   Wheelchair Mobility     Modified Rankin (Stroke Patients Only)       Balance Overall balance assessment: Needs assistance Sitting-balance support: No upper extremity supported Sitting balance-Leahy Scale: Good Sitting balance - Comments: leaning over to don pants   Standing balance support: No upper extremity supported Standing balance-Leahy Scale: Poor Standing balance comment: standing to don pants, but legs braced against chair                            Cognition Arousal/Alertness: Awake/alert Behavior During Therapy: WFL for tasks assessed/performed Overall Cognitive Status: Within Functional Limits for tasks assessed                                        Exercises Total Joint Exercises Ankle Circles/Pumps: AROM;10 reps;Both;Seated Quad Sets: AROM;10 reps;Right;Seated Short Arc Quad: AROM;10 reps;Right;Seated Heel Slides: AAROM;Right;10 reps;Seated Long Arc Quad: AROM;10 reps;Right;Seated    General Comments General comments (skin integrity, edema, etc.): Issued HEP and educated on repititions, frequency, use of ice and medicaitons.       Pertinent Vitals/Pain Pain Assessment: Faces Faces Pain Scale: Hurts little more Pain Location: R hip Pain Descriptors / Indicators: Guarding;Grimacing;Spasm Pain Intervention(s): Monitored during session;Repositioned;Ice applied    Home Living                      Prior Function            PT Goals (current goals  can now be found in the care plan section) Progress towards PT goals: Goals met/education completed, patient discharged from PT    Frequency    7X/week      PT Plan Current plan remains appropriate    Co-evaluation              AM-PAC PT "6 Clicks" Daily Activity  Outcome Measure  Difficulty turning over in bed (including adjusting bedclothes, sheets and blankets)?: A Little Difficulty moving from lying on back to sitting on the side of the bed? : A Little Difficulty sitting  down on and standing up from a chair with arms (e.g., wheelchair, bedside commode, etc,.)?: A Little Help needed moving to and from a bed to chair (including a wheelchair)?: A Little Help needed walking in hospital room?: A Little Help needed climbing 3-5 steps with a railing? : A Little 6 Click Score: 18    End of Session Equipment Utilized During Treatment: Gait belt Activity Tolerance: Patient tolerated treatment well Patient left: with call bell/phone within reach;in chair Nurse Communication: Other (comment)(ready for D/C) PT Visit Diagnosis: Difficulty in walking, not elsewhere classified (R26.2);Pain Pain - Right/Left: Right Pain - part of body: Hip     Time: 6073-7106 PT Time Calculation (min) (ACUTE ONLY): 30 min  Charges:  $Gait Training: 8-22 mins $Therapeutic Exercise: 8-22 mins                    G CodesMagda Mercado, [place] [phone] 01/06/2018    Reginia Naas 01/06/2018, 10:22 AM

## 2018-01-06 NOTE — Progress Notes (Signed)
Patient discharged to home with family. Given all belongings, instructions, prescriptions. Verbalized understanding of all instructions. Escorted to pov via w/c. 

## 2018-01-07 NOTE — Discharge Summary (Signed)
Physician Discharge Summary  Patient ID: TONNY ISENSEE MRN: [id number] DOB/AGE: [birth year] 67 y.o.  Admit date: 01/05/2018 Discharge date: 01/06/2018   Procedures:  Procedure(s) (LRB): RIGHT TOTAL HIP ARTHROPLASTY ANTERIOR APPROACH (Right)  Attending Physician:  Dr. Paralee Cancel   Admission Diagnoses:   Right hip primary OA / pain  Discharge Diagnoses:  Principal Problem:   S/P right THA, AA Active Problems:   S/P left THA, AA   Obese  Past Medical History:  Diagnosis Date  . Arthritis    OA AND PAIN LEFT HIP  . CHB (complete heart block) ([place])    a. MDT dual chamber pacemaker  . HTN (hypertension)   . Hyperlipidemia   . Presence of permanent cardiac pacemaker    2000  . Prostate cancer ([place]) 09/2014    HPI:    Taiwan Talcott. Minniear, 67 y.o. male, has a history of pain and functional disability in the right hip(s) due to arthritis and patient has failed non-surgical conservative treatments for greater than 12 weeks to include NSAID's and/or analgesics and activity modification.  Onset of symptoms was abrupt starting 6+ months  ago with gradually worsening course since that time.The patient noted prior procedures of the hip to include arthroplasty on the left hip per Dr. Alvan Dame in December of 2015.  Patient currently rates pain in the right hip at 6 out of 10 with activity. Patient has worsening of pain with activity and weight bearing, trendelenberg gait, pain that interfers with activities of daily living and pain with passive range of motion. Patient has evidence of periarticular osteophytes and joint space narrowing by imaging studies. This condition presents safety issues increasing the risk of falls. There is no current active infection.  Risks, benefits and expectations were discussed with the patient.  Risks including but not limited to the risk of anesthesia, blood clots, nerve damage, blood vessel damage, failure of the prosthesis, infection and up to and including death.   Patient understand the risks, benefits and expectations and wishes to proceed with surgery.   PCP: [name], MD   Discharged Condition: good  Hospital Course:  Patient underwent the above stated procedure on 01/05/2018. Patient tolerated the procedure well and brought to the recovery room in good condition and subsequently to the floor.  POD #1 BP: 114/72 ; Pulse: 60 ; Temp: 97.5 F (36.4 C) ; Resp: 18 Patient reports pain as mild/moderate.  States that he is having muscle spasms in the hip, he doesn't remember these from the other hip surgery. Dr. Alvan Dame did discussed the muscle relaxer to help relieve these symptoms.  He also discussed using ice on the hip, but if needed can use heat on the muscle to also try to relieve muscle spasms.  Ready to be discharged home. Dorsiflexion/plantar flexion intact, incision: dressing C/D/I, no cellulitis present and compartment soft.   LABS  Basename    HGB     11.1  HCT     31.9    Discharge Exam: General appearance: alert, cooperative and no distress Extremities: Homans sign is negative, no sign of DVT, no edema, redness or tenderness in the calves or thighs and no ulcers, gangrene or trophic changes  Disposition:  Home with follow up in 2 weeks   Follow-up Information    Paralee Cancel, MD. Schedule an appointment as soon as possible for a visit in 2 weeks.   Specialty:  Orthopedic Surgery Contact information: [address] [place] [address] [phone]  Discharge Instructions    Call MD / Call 911   Complete by:  As directed    If you experience chest pain or shortness of breath, CALL 911 and be transported to the hospital emergency room.  If you develope a fever above 101 F, pus (white drainage) or increased drainage or redness at the wound, or calf pain, call your surgeon's office.   Change dressing   Complete by:  As directed    Maintain surgical dressing until follow up in the clinic. If the  edges start to pull up, may reinforce with tape. If the dressing is no longer working, may remove and cover with gauze and tape, but must keep the area dry and clean.  Call with any questions or concerns.   Constipation Prevention   Complete by:  As directed    Drink plenty of fluids.  Prune juice may be helpful.  You may use a stool softener, such as Colace (over the counter) 100 mg twice a day.  Use MiraLax (over the counter) for constipation as needed.   Diet - low sodium heart healthy   Complete by:  As directed    Discharge instructions   Complete by:  As directed    Maintain surgical dressing until follow up in the clinic. If the edges start to pull up, may reinforce with tape. If the dressing is no longer working, may remove and cover with gauze and tape, but must keep the area dry and clean.  Follow up in 2 weeks at [facility]. Call with any questions or concerns.   Increase activity slowly as tolerated   Complete by:  As directed    Weight bearing as tolerated with assist device (walker, cane, etc) as directed, use it as long as suggested by your surgeon or therapist, typically at least 4-6 weeks.   TED hose   Complete by:  As directed    Use stockings (TED hose) for 2 weeks on both leg(s).  You may remove them at night for sleeping.      Allergies as of 01/06/2018   No Known Allergies     Medication List    STOP taking these medications   aspirin 81 MG tablet Replaced by:  aspirin 81 MG chewable tablet   leuprolide (6 Month) 45 MG injection Commonly known as:  ELIGARD     TAKE these medications   aspirin 81 MG chewable tablet Commonly known as:  ASPIRIN CHILDRENS Chew 1 tablet (81 mg total) by mouth 2 (two) times daily. Take for 4 weeks, then resume regular dose. Replaces:  aspirin 81 MG tablet   atorvastatin 20 MG tablet Commonly known as:  LIPITOR TAKE ONE TABLET BY MOUTH ONCE DAILY   docusate sodium 100 MG capsule Commonly known as:  COLACE Take 1  capsule (100 mg total) by mouth 2 (two) times daily.   ferrous sulfate 325 (65 FE) MG tablet Commonly known as:  FERROUSUL Take 1 tablet (325 mg total) by mouth 3 (three) times daily with meals.   hydrochlorothiazide 12.5 MG tablet Commonly known as:  HYDRODIURIL Take 12.5 mg by mouth daily.   HYDROcodone-acetaminophen 7.5-325 MG tablet Commonly known as:  NORCO Take 1-2 tablets by mouth every 4 (four) hours as needed for moderate pain.   lisinopril 20 MG tablet Commonly known as:  PRINIVIL,ZESTRIL TAKE ONE TABLET BY MOUTH ONCE DAILY   methocarbamol 500 MG tablet Commonly known as:  ROBAXIN Take 1 tablet (500 mg total) by mouth every  6 (six) hours as needed for muscle spasms.   polyethylene glycol packet Commonly known as:  MIRALAX / GLYCOLAX Take 17 g by mouth 2 (two) times daily.            Discharge Care Instructions  (From admission, onward)        Start     Ordered   01/06/18 0000  Change dressing    Comments:  Maintain surgical dressing until follow up in the clinic. If the edges start to pull up, may reinforce with tape. If the dressing is no longer working, may remove and cover with gauze and tape, but must keep the area dry and clean.  Call with any questions or concerns.   01/06/18 0757       Signed: [place] Pugh. [place]ola Key   PA-C  01/07/2018, 11:36 AM

## 2018-01-27 LAB — CUP PACEART INCLINIC DEVICE CHECK
Battery Impedance: 499 Ohm
Brady Statistic AP VP Percent: 7 %
Brady Statistic AP VS Percent: 0 %
Brady Statistic AS VP Percent: 93 %
Brady Statistic AS VS Percent: 0 %
Implantable Lead Implant Date: 20000731
Implantable Lead Implant Date: 20000731
Implantable Lead Location: 753859
Implantable Lead Location: 753860
Implantable Lead Model: 5092
Lead Channel Impedance Value: 631 Ohm
Lead Channel Pacing Threshold Amplitude: 1 V
Lead Channel Pacing Threshold Pulse Width: 0.4 ms
Lead Channel Sensing Intrinsic Amplitude: 4 mV
Lead Channel Setting Pacing Amplitude: 2 V
MDC IDC MSMT BATTERY REMAINING LONGEVITY: 82 mo
MDC IDC MSMT BATTERY VOLTAGE: 2.77 V
MDC IDC MSMT LEADCHNL RA IMPEDANCE VALUE: 360 Ohm
MDC IDC MSMT LEADCHNL RV PACING THRESHOLD AMPLITUDE: 1 V
MDC IDC MSMT LEADCHNL RV PACING THRESHOLD PULSEWIDTH: 0.4 ms
MDC IDC PG IMPLANT DT: 20121026
MDC IDC SESS DTM: 20190612150028
MDC IDC SET LEADCHNL RV PACING AMPLITUDE: 2.75 V
MDC IDC SET LEADCHNL RV PACING PULSEWIDTH: 0.4 ms
MDC IDC SET LEADCHNL RV SENSING SENSITIVITY: 2.8 mV

## 2018-03-16 ENCOUNTER — Other Ambulatory Visit: Payer: Self-pay | Admitting: Family Medicine

## 2018-03-24 ENCOUNTER — Encounter: Payer: Medicare Other | Admitting: *Deleted

## 2018-03-24 ENCOUNTER — Telehealth: Payer: Self-pay

## 2018-03-24 NOTE — Telephone Encounter (Signed)
LMOVM reminding pt to send remote transmission.   

## 2018-03-25 ENCOUNTER — Encounter: Payer: Self-pay | Admitting: Cardiology

## 2018-04-01 DIAGNOSIS — Z79818 Long term (current) use of other agents affecting estrogen receptors and estrogen levels: Secondary | ICD-10-CM | POA: Insufficient documentation

## 2018-04-01 LAB — HEPATIC FUNCTION PANEL
ALT: 20 (ref 10–40)
AST: 17 (ref 14–40)
Alkaline Phosphatase: 57 (ref 25–125)
BILIRUBIN, TOTAL: 0.6

## 2018-04-01 LAB — PSA: PSA: 0.2

## 2018-04-01 LAB — BASIC METABOLIC PANEL
Glucose: 130
Potassium: 4.3 (ref 3.4–5.3)
Sodium: 137 (ref 137–147)

## 2018-04-01 LAB — CBC AND DIFFERENTIAL
HEMATOCRIT: 42 (ref 41–53)
Hemoglobin: 13.8 (ref 13.5–17.5)
PLATELETS: 190 (ref 150–399)
WBC: 4.7

## 2018-04-08 ENCOUNTER — Encounter: Payer: Self-pay | Admitting: Family Medicine

## 2018-05-07 ENCOUNTER — Ambulatory Visit (INDEPENDENT_AMBULATORY_CARE_PROVIDER_SITE_OTHER): Payer: Medicare Other | Admitting: *Deleted

## 2018-05-07 DIAGNOSIS — Z23 Encounter for immunization: Secondary | ICD-10-CM | POA: Diagnosis not present

## 2018-06-08 ENCOUNTER — Encounter: Payer: Self-pay | Admitting: Cardiology

## 2019-01-04 ENCOUNTER — Telehealth: Payer: Self-pay | Admitting: *Deleted

## 2019-01-04 NOTE — Telephone Encounter (Signed)

## 2019-01-06 ENCOUNTER — Telehealth: Payer: Medicare Other | Admitting: Internal Medicine

## 2019-01-13 ENCOUNTER — Encounter: Payer: Medicare Other | Admitting: *Deleted

## 2019-01-13 ENCOUNTER — Telehealth: Payer: Self-pay

## 2019-01-13 NOTE — Telephone Encounter (Signed)
Pt tried to send a manual transmission however his monitor is not working. I gave him the number to [employer] tech support.

## 2019-01-13 NOTE — Telephone Encounter (Signed)
Left message for patient to remind of missed remote transmission.  

## 2019-01-21 ENCOUNTER — Encounter: Payer: Self-pay | Admitting: Cardiology

## 2019-03-14 ENCOUNTER — Other Ambulatory Visit: Payer: Self-pay | Admitting: Family Medicine

## 2019-03-14 NOTE — Telephone Encounter (Signed)
Last OV 12/22/2017  Pt needs OV, please call to schedule.   Thanks!

## 2019-03-15 ENCOUNTER — Other Ambulatory Visit: Payer: Self-pay

## 2019-03-15 ENCOUNTER — Encounter: Payer: Self-pay | Admitting: Internal Medicine

## 2019-03-15 ENCOUNTER — Ambulatory Visit (INDEPENDENT_AMBULATORY_CARE_PROVIDER_SITE_OTHER): Payer: Medicare Other | Admitting: Internal Medicine

## 2019-03-15 VITALS — BP 146/84 | HR 78 | Ht 71.0 in | Wt 232.0 lb

## 2019-03-15 DIAGNOSIS — I442 Atrioventricular block, complete: Secondary | ICD-10-CM

## 2019-03-15 DIAGNOSIS — Z950 Presence of cardiac pacemaker: Secondary | ICD-10-CM | POA: Diagnosis not present

## 2019-03-15 DIAGNOSIS — R002 Palpitations: Secondary | ICD-10-CM

## 2019-03-15 MED ORDER — LISINOPRIL 20 MG PO TABS: 20.0000 mg | ORAL_TABLET | Freq: Every day | ORAL | 3 refills | Status: DC

## 2019-03-15 MED ORDER — HYDROCHLOROTHIAZIDE 12.5 MG PO TABS: 12.5000 mg | ORAL_TABLET | Freq: Every day | ORAL | 3 refills | Status: DC

## 2019-03-15 MED ORDER — ATORVASTATIN CALCIUM 20 MG PO TABS: 20.0000 mg | ORAL_TABLET | Freq: Every day | ORAL | 3 refills | Status: DC

## 2019-03-15 NOTE — Telephone Encounter (Signed)
Patient has been scheduled for October 2nd @ 8am

## 2019-03-15 NOTE — Progress Notes (Signed)
HPI Devin Mercado returns today for followup. He is a pleasant 68 yo man with a h/o HTN, CHB, s/p PPM insertion, mild diastolic heart failure, who admits to being out of his medications for a couple of days. He denies chest pain, sob, or syncope. No edema.  No Known Allergies   Current Outpatient Medications  Medication Sig Dispense Refill  . atorvastatin (LIPITOR) 20 MG tablet Take 1 tablet (20 mg total) by mouth daily. 90 tablet 3  . docusate sodium (COLACE) 100 MG capsule Take 1 capsule (100 mg total) by mouth 2 (two) times daily. 10 capsule 0  . ferrous sulfate (FERROUSUL) 325 (65 FE) MG tablet Take 1 tablet (325 mg total) by mouth 3 (three) times daily with meals.  3  . hydrochlorothiazide (HYDRODIURIL) 12.5 MG tablet Take 1 tablet (12.5 mg total) by mouth daily. 90 tablet 3  . HYDROcodone-acetaminophen (NORCO) 7.5-325 MG tablet Take 1-2 tablets by mouth every 4 (four) hours as needed for moderate pain. 60 tablet 0  . lisinopril (ZESTRIL) 20 MG tablet Take 1 tablet (20 mg total) by mouth daily. 90 tablet 3  . methocarbamol (ROBAXIN) 500 MG tablet Take 1 tablet (500 mg total) by mouth every 6 (six) hours as needed for muscle spasms. 40 tablet 0  . polyethylene glycol (MIRALAX / GLYCOLAX) packet Take 17 g by mouth 2 (two) times daily. 14 each 0   No current facility-administered medications for this visit.      Past Medical History:  Diagnosis Date  . Arthritis    OA AND PAIN LEFT HIP  . CHB (complete heart block) ([place])    a. MDT dual chamber pacemaker  . HTN (hypertension)   . Hyperlipidemia   . Presence of permanent cardiac pacemaker    2000  . Prostate cancer ([place]) 09/2014    ROS:   All systems reviewed and negative except as noted in the HPI.   Past Surgical History:  Procedure Laterality Date  . ADENOIDECTOMY AS A CHILD    . COLONOSCOPY WITH PROPOFOL N/A 12/30/2017   Procedure: COLONOSCOPY WITH PROPOFOL;  Surgeon: Ladene Artist, MD;  [facility];   Service: Endoscopy;  Laterality: N/A;  . JOINT REPLACEMENT     Right hip replacement Dr. Alvan Dame 01-05-18  . LYMPHADENECTOMY Bilateral 11/06/2014   Procedure: PELVIC LYMPHADENECTOMY;  Surgeon: [name], MD;  [facility]: [facility];  Service: Urology;  Laterality: Bilateral;  . PACEMAKER INSERTION  2000; 2012   MDT dual chamber pacemaker implanted 2000 with gen change 2012  . POLYPECTOMY  12/30/2017   Procedure: POLYPECTOMY;  Surgeon: Ladene Artist, MD;  [facility];  Service: Endoscopy;;  . PROSTATE BIOPSY  09/05/14  . ROBOT ASSISTED LAPAROSCOPIC RADICAL PROSTATECTOMY N/A 11/06/2014   Procedure: ROBOTIC ASSISTED LAPAROSCOPIC RADICAL PROSTATECTOMY LEVEL 2;  Surgeon: [name], MD;  [facility];  Service: Urology;  Laterality: N/A;  . TOTAL HIP ARTHROPLASTY Left 07/03/2014   Procedure: LEFT TOTAL HIP ARTHROPLASTY ANTERIOR APPROACH;  Surgeon: [name], MD;  [facility];  Service: Orthopedics;  Laterality: Left;  . TOTAL HIP ARTHROPLASTY Right 01/05/2018   Procedure: RIGHT TOTAL HIP ARTHROPLASTY ANTERIOR APPROACH;  Surgeon: Paralee Cancel, MD;  [facility];  Service: Orthopedics;  Laterality: Right;  70 mins  . WISDOM TEETH EXTRACTIONS       Family History  Problem Relation Age of Onset  . Stroke Mother        multiple. 34.   Marland Kitchen  Heart disease Mother        hx heart valve problem  . Prostate cancer Father        37 of prostate cancer  . Heart attack Neg Hx   . Hypertension Neg Hx      Social History   Socioeconomic History  . Marital status: Married    Spouse name: Not on file  . Number of children: 4  . Years of education: Not on file  . Highest education level: Not on file  Occupational History  . Occupation: attorney  Social Needs  . Financial resource strain: Not on file  . Food insecurity    Worry: Not on file    Inability: Not on file  . Transportation needs    Medical: Not on file    Non-medical: Not on file  Tobacco Use  . Smoking  status: Never Smoker  . Smokeless tobacco: Never Used  Substance and Sexual Activity  . Alcohol use: Yes    Alcohol/week: 2.0 - 3.0 standard drinks    Types: 2 - 3 Standard drinks or equivalent per week    Comment: 5 TO 7 DRINKS A WEEK stopped etoh use pending surgery   . Drug use: No  . Sexual activity: Yes  Lifestyle  . Physical activity    Days per week: Not on file    Minutes per session: Not on file  . Stress: Not on file  Relationships  . Social Herbalist on phone: Not on file    Gets together: Not on file    Attends religious service: Not on file    Active member of club or organization: Not on file    Attends meetings of clubs or organizations: Not on file    Relationship status: Not on file  . Intimate partner violence    Fear of current or ex partner: Not on file    Emotionally abused: Not on file    Physically abused: Not on file    Forced sexual activity: Not on file  Other Topics Concern  . Not on file  Social History Narrative   Married to Dr. [name] 1979. 4 kids (1 lost to auto accident-3 surviving), no grandkids. 1 daughter married in 2016 and works as Forensic psychologist in [place].       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[employer], vacation     BP (!) 146/84   Pulse 78   Ht 5\' 11"  (1.803 m)   Wt 232 lb (105.2 kg)   SpO2 91%   BMI 32.36 kg/m   Physical Exam:  Well appearing NAD HEENT: Unremarkable Neck:  No JVD, no thyromegally Lymphatics:  No adenopathy Back:  No CVA tenderness Lungs:  Clear with no wheezes HEART:  Regular rate rhythm, no murmurs, no rubs, no clicks Abd:  soft, positive bowel sounds, no organomegally, no rebound, no guarding Ext:  2 plus pulses, no edema, no cyanosis, no clubbing Skin:  No rashes no nodules Neuro:  CN II through XII intact, motor grossly intact  EKG - NSR with ventricular pacing  DEVICE  Normal device function.  See PaceArt for details.   Assess/Plan: 1. CHB - he is asymptomatic, s/p PPM  insertion.  2. PPM - his medtronic DDD PM is working normally.  3. Dyslipidemia - he will continue his statin therapy.   Mikle [email or web address].D.

## 2019-03-15 NOTE — Patient Instructions (Addendum)

## 2019-03-24 LAB — HEPATIC FUNCTION PANEL
ALT: 25 (ref 10–40)
AST: 19 (ref 14–40)
Alkaline Phosphatase: 59 (ref 25–125)
Bilirubin, Total: 0.9

## 2019-03-24 LAB — CBC AND DIFFERENTIAL
HCT: 42 (ref 41–53)
Hemoglobin: 14 (ref 13.5–17.5)
Neutrophils Absolute: 4
Platelets: 188 (ref 150–399)
WBC: 5.1

## 2019-03-24 LAB — BASIC METABOLIC PANEL
BUN: 38 — AB (ref 4–21)
Creatinine: 1.2 (ref 0.6–1.3)
Glucose: 129
Potassium: 4.6 (ref 3.4–5.3)
Sodium: 138 (ref 137–147)

## 2019-03-24 LAB — PSA: PSA: 11.57

## 2019-04-07 ENCOUNTER — Telehealth: Payer: Self-pay | Admitting: Family Medicine

## 2019-04-07 NOTE — Telephone Encounter (Signed)
I left a message asking the patient to call me at [phone] to schedule AWV with Loma Sousa on 04/15/2019.  Im waiting for a call back to either confirm or decline the appointment. VDM (Dee-Dee)

## 2019-04-11 ENCOUNTER — Encounter: Payer: Self-pay | Admitting: Family Medicine

## 2019-04-11 NOTE — Progress Notes (Signed)
Phone: [phone]   Subjective:  Patient presents today for their annual physical. Chief complaint-noted.   See problem oriented charting- ROS- full  review of systems was completed and negative  Including No chest pain or shortness of breath. No headache or blurry vision.   The following were reviewed and entered/updated in epic: Past Medical History:  Diagnosis Date  . Arthritis    OA AND PAIN LEFT HIP  . CHB (complete heart block) ([place])    a. MDT dual chamber pacemaker  . HTN (hypertension)   . Hyperlipidemia   . Presence of permanent cardiac pacemaker    2000  . Prostate cancer ([place]) 09/2014   Patient Active Problem List   Diagnosis Date Noted  . Prostate cancer ([place]) 09/12/2014    Priority: High  . PPM-Medtronic 05/23/2010    Priority: High  . Atrioventricular block, complete ([place]) 03/28/2009    Priority: High  . History of adenomatous polyp of colon 04/15/2019    Priority: Medium  . Hyperglycemia 06/12/2015    Priority: Medium  . Hyperlipidemia 03/28/2009    Priority: Medium  . Essential hypertension 03/16/2008    Priority: Medium  . S/P right THA, AA 01/05/2018    Priority: Low  . Obese 07/04/2014    Priority: Low  . S/P left THA, AA 07/03/2014    Priority: Low  . Palpitations 03/15/2019  . Positive colorectal cancer screening using Cologuard test    Past Surgical History:  Procedure Laterality Date  . ADENOIDECTOMY AS A CHILD    . COLONOSCOPY WITH PROPOFOL N/A 12/30/2017   Procedure: COLONOSCOPY WITH PROPOFOL;  Surgeon: Ladene Artist, MD;  [facility];  Service: Endoscopy;  Laterality: N/A;  . JOINT REPLACEMENT     Right hip replacement Dr. Alvan Dame 01-05-18  . LYMPHADENECTOMY Bilateral 11/06/2014   Procedure: PELVIC LYMPHADENECTOMY;  Surgeon: [name], MD;  [facility];  Service: Urology;  Laterality: Bilateral;  . PACEMAKER INSERTION  2000; 2012   MDT dual chamber pacemaker implanted 2000 with gen change 2012  . POLYPECTOMY   12/30/2017   Procedure: POLYPECTOMY;  Surgeon: Ladene Artist, MD;  [facility];  Service: Endoscopy;;  . PROSTATE BIOPSY  09/05/14  . ROBOT ASSISTED LAPAROSCOPIC RADICAL PROSTATECTOMY N/A 11/06/2014   Procedure: ROBOTIC ASSISTED LAPAROSCOPIC RADICAL PROSTATECTOMY LEVEL 2;  Surgeon: [name], MD;  [facility];  Service: Urology;  Laterality: N/A;  . TOTAL HIP ARTHROPLASTY Left 07/03/2014   Procedure: LEFT TOTAL HIP ARTHROPLASTY ANTERIOR APPROACH;  Surgeon: [name], MD;  [facility];  Service: Orthopedics;  Laterality: Left;  . TOTAL HIP ARTHROPLASTY Right 01/05/2018   Procedure: RIGHT TOTAL HIP ARTHROPLASTY ANTERIOR APPROACH;  Surgeon: Paralee Cancel, MD;  [facility];  Service: Orthopedics;  Laterality: Right;  70 mins  . WISDOM TEETH EXTRACTIONS      Family History  Problem Relation Age of Onset  . Stroke Mother        multiple. 66.   . Heart disease Mother        hx heart valve problem  . Prostate cancer Father        45 of prostate cancer  . Heart attack Neg Hx   . Hypertension Neg Hx     Medications- reviewed and updated Current Outpatient Medications  Medication Sig Dispense Refill  . aspirin EC 81 MG tablet Take 81 mg by mouth daily.    Marland Kitchen atorvastatin (LIPITOR) 20 MG tablet Take 1 tablet (20 mg total) by mouth  daily. 90 tablet 3  . Darolutamide (NUBEQA) 300 MG TABS Take by mouth.    Marland Kitchen leuprolide, 6 Month, (ELIGARD) 45 MG injection Inject 45 mg into the skin every 6 (six) months.    Marland Kitchen lisinopril (ZESTRIL) 20 MG tablet Take 1 tablet (20 mg total) by mouth daily. 90 tablet 3  . hydrochlorothiazide (HYDRODIURIL) 12.5 MG tablet Take 1 tablet (12.5 mg total) by mouth daily. 90 tablet 3   No current facility-administered medications for this visit.     Allergies-reviewed and updated No Known Allergies  Social History   Social History Narrative   Married to Dr. [name] 1979. 4 kids (1 lost to auto accident-3 surviving), no grandkids. 1  daughter married in 2016 and works as Forensic psychologist in [place].       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 Oropharynx normal Neck: no thyromegaly CV: RRR no murmurs rubs or gallops Lungs: CTAB no crackles, wheeze, rhonchi Abdomen: soft/nontender/nondistended/normal bowel sounds. No rebound or guarding.  Ext: no edema Skin: warm, dry, on right cheek there is 2-3 x 2-3 mm erythetmatous lesion with scaly top- similar 1-2 x 1-2 lesions on top of right ear x 2  Neuro: grossly normal, moves all extremities, PERRLA  Procedure note: Benefits and risks verbally discussed with patient 5-10 second freeze thaw cycle of cryotherapy performed  with liquid nitrogen to actinic keratosis on right cheek (10 seconds), and right ear x2 (5 seconds) No complications.  Patient tolerated the procedure well other than mild pain. Gave handout on this from [facility] and we reviewed this content thoroughly [email or web address]    Assessment and Plan  68 y.o. male presenting for annual physical.   Health Maintenance counseling: 1. Anticipatory guidance: Patient counseled regarding regular dental exams -q6 months, eye exams -yearly,  avoiding smoking and second hand smoke , limiting alcohol to 2 beverages per day .   2. Risk factor reduction:  Advised patient of need for regular exercise and diet rich and fruits and vegetables to reduce risk of heart attack and stroke. Exercise- gym had closed and made things difficult- they are opening up and he is hoping to start back. Diet- up about 10 lbs- feels like too much time sitting at computer- wants to cut back on late night snacking- eats dinner after 8 anyway.  Wt  Readings from Last 3 Encounters:  04/15/19 231 lb 11.3 oz (105.1 kg)  04/15/19 231 lb 9.6 oz (105.1 kg)  03/15/19 232 lb (105.2 kg)  3. Immunizations/screenings/ancillary studies- influenza vaccination already received. Recommended Tdap at pharmacy. Discussed shingrix as well Immunization History  Administered Date(s) Administered  . Influenza Split 06/06/2011, 05/13/2012  . Influenza Whole 05/24/2008, 07/05/2009, 05/21/2010  . Influenza, High Dose Seasonal PF 05/05/2014, 05/11/2015, 05/23/2016, 04/24/2017, 05/07/2018, 03/24/2019  . Influenza,inj,Quad PF,6+ Mos 05/13/2013  . Pneumococcal Conjugate-13 05/29/2014  . Pneumococcal Polysaccharide-23 05/15/2017  . Td 03/28/2009  . Zoster 06/10/2013  4. Prostate cancer -  history of gleason 4+4=8-cared for by [facility]. Patient is on lupron injections every 6 months. Started oral therapy September 1st. PSA trended up to 11 with [facility] (will abstract in). He asks me to do  another PSA today Lab Results  Component Value Date   PSA 0.20 04/01/2018   PSA <0.01 10/27/2016   PSA 0.02 12/24/2015   5. Colon cancer screening -  12/20/17 with 3 year follow up planned due to adenomatous colon polyp history. Done due to positive cologuard 6. Skin cancer screening- no dermatologist. advised regular sunscreen use. Denies worrisome, changing, or new skin lesions.  7. Never smoker  Status of chronic or acute concerns   Had cbc, cmp, psa at [facility] last month- we will get lipid, a1c- he also specifically requested PSA after starting new oral therapy for prostate cancer  #hypertension S: controlled on HCTZ 12.5mg , lisinopril 20mg  A/P: Stable. Continue current medications.   #hyperlipidemia S:  controlled on lipitor 20mg . Also takes aspirin for primary preventinon  A/P: hopefully stable- update lipid panel  Patient with pacemaker Dr. Lovena Le due to av block- follows with cardiology- appears stable  Hyperglycemia- high cbgs in past- will get a1c today.  He knows he  needs to work on weight loss, healthy eating, exercise Lab Results  Component Value Date   HGBA1C 5.6 10/05/2007   Patient's prescriptions are provided by Dr. Maida Sale am happy to refill atorvastatin, lisinopril, hydrochlorothiazide if needed.  Prostate cancer treatments provided by [facility]rology  Recommended follow up: 1 year physical.  Also having Medicare wellness visit today Future Appointments  Date Time Provider Wall  06/14/2019  7:15 AM [facility] DEVICE REMOTES [id number]   Lab/Order associations: fasting   ICD-10-CM   1. Preventative health care  Z00.00 Lipid panel    Hemoglobin A1c  2. History of adenomatous polyp of colon  Z86.010   3. Hyperlipidemia, unspecified hyperlipidemia type  E78.5 Lipid panel  4. Hyperglycemia  R73.9 Hemoglobin A1c  5. Essential hypertension  I10   6. Prostate cancer ([place])  C61 PSA   Return precautions advised.  Garret Reddish, MD

## 2019-04-11 NOTE — Patient Instructions (Addendum)
Health Maintenance Due  Topic Date Due  . INFLUENZA VACCINE -03/2019 02/12/2019  . TETANUS/TDAP -would check with your pharmacy and get this updated- typically cheaper at pharmacy than in office.  03/29/2019   Please check with your pharmacy to see if they have the shingrix vaccine. If they do- please get this immunization and update [place] by phone call or mychart with dates you receive the vaccine  Cryotherapy on three actinic keratosis done today. See handout  Please stop by lab before you go If you do not have mychart- we will call you about results within 5 business days of [place] receiving them.  If you have mychart- we will send your results within 3 business days of [place] receiving them.  If abnormal or we want to clarify a result, we will call or mychart you to make sure you receive the message.  If you have questions or concerns or don't hear within 5-7 days, please send [place] a message or call us.

## 2019-04-15 ENCOUNTER — Ambulatory Visit (INDEPENDENT_AMBULATORY_CARE_PROVIDER_SITE_OTHER): Payer: Medicare Other

## 2019-04-15 ENCOUNTER — Ambulatory Visit (INDEPENDENT_AMBULATORY_CARE_PROVIDER_SITE_OTHER): Payer: Medicare Other | Admitting: Family Medicine

## 2019-04-15 ENCOUNTER — Other Ambulatory Visit: Payer: Self-pay

## 2019-04-15 VITALS — BP 128/82 | Temp 97.6°F | Ht 71.0 in | Wt 231.7 lb

## 2019-04-15 VITALS — BP 128/82 | HR 70 | Temp 97.6°F | Ht 71.0 in | Wt 231.6 lb

## 2019-04-15 DIAGNOSIS — Z0001 Encounter for general adult medical examination with abnormal findings: Secondary | ICD-10-CM

## 2019-04-15 DIAGNOSIS — C61 Malignant neoplasm of prostate: Secondary | ICD-10-CM | POA: Diagnosis not present

## 2019-04-15 DIAGNOSIS — Z0000 Encounter for general adult medical examination without abnormal findings: Secondary | ICD-10-CM

## 2019-04-15 DIAGNOSIS — E785 Hyperlipidemia, unspecified: Secondary | ICD-10-CM

## 2019-04-15 DIAGNOSIS — Z86010 Personal history of colonic polyps: Secondary | ICD-10-CM | POA: Insufficient documentation

## 2019-04-15 DIAGNOSIS — L570 Actinic keratosis: Secondary | ICD-10-CM

## 2019-04-15 DIAGNOSIS — R739 Hyperglycemia, unspecified: Secondary | ICD-10-CM | POA: Diagnosis not present

## 2019-04-15 DIAGNOSIS — I10 Essential (primary) hypertension: Secondary | ICD-10-CM

## 2019-04-15 LAB — LIPID PANEL
Cholesterol: 155 mg/dL (ref 0–200)
HDL: 60.3 mg/dL (ref >39.00)
LDL Cholesterol: 75 mg/dL (ref 0–99)
NonHDL: 94.95
Total CHOL/HDL Ratio: 3
Triglycerides: 102 mg/dL (ref 0.0–149.0)
VLDL: 20.4 mg/dL (ref 0.0–40.0)

## 2019-04-15 LAB — PSA: PSA: 2.83 ng/mL (ref 0.10–4.00)

## 2019-04-15 LAB — HEMOGLOBIN A1C: Hgb A1c MFr Bld: 6.2 % (ref 4.6–6.5)

## 2019-04-15 NOTE — Progress Notes (Signed)
I have reviewed and agree with note, evaluation, plan.   Shalla Bulluck, MD  

## 2019-04-15 NOTE — Progress Notes (Signed)
Subjective:   Devin Mercado is a 68 y.o. male who presents for an Initial Medicare Annual Wellness Visit.  Review of Systems   Cardiac Risk Factors include: advanced age (>59men, >12 women);male gender;hypertension    Objective:    Today's Vitals   04/15/19 0903  BP: 128/82  Weight: 231 lb 11.3 oz (105.1 kg)  Height: 5\' 11"  (1.803 m)   Body mass index is 32.32 kg/m.  Advanced Directives 04/15/2019 01/05/2018 12/31/2017 12/30/2017 03/06/2015 11/06/2014 07/03/2014  Does Patient Have a Medical Advance Directive? Yes Yes Yes Yes Yes Yes Yes  Type of Advance Directive Living will;Healthcare Power of [place];Living will [place];Living will [place];Living will [place];Living will [place];Living will Living will  Does patient want to make changes to medical advance directive? No - Patient declined No - Patient declined No - Patient declined - - No - Patient declined No - Patient declined  Copy of [place] in Chart? No - copy requested Yes Yes - Yes Yes Yes    Current Medications (verified) Outpatient Encounter Medications as of 04/15/2019  Medication Sig  . atorvastatin (LIPITOR) 20 MG tablet Take 1 tablet (20 mg total) by mouth daily.  . hydrochlorothiazide (HYDRODIURIL) 12.5 MG tablet Take 1 tablet (12.5 mg total) by mouth daily.  Marland Kitchen lisinopril (ZESTRIL) 20 MG tablet Take 1 tablet (20 mg total) by mouth daily.   No facility-administered encounter medications on file as of 04/15/2019.     Allergies (verified) Patient has no known allergies.   History: Past Medical History:  Diagnosis Date  . Arthritis    OA AND PAIN LEFT HIP  . CHB (complete heart block) ([place])    a. MDT dual chamber pacemaker  . HTN (hypertension)   . Hyperlipidemia   . Presence of permanent cardiac pacemaker    2000  . Prostate cancer ([place]) 09/2014   Past Surgical History:   Procedure Laterality Date  . ADENOIDECTOMY AS A CHILD    . COLONOSCOPY WITH PROPOFOL N/A 12/30/2017   Procedure: COLONOSCOPY WITH PROPOFOL;  Surgeon: Ladene Artist, MD;  Location: [facility];  Service: Endoscopy;  Laterality: N/A;  . JOINT REPLACEMENT     Right hip replacement Dr. Alvan Dame 01-05-18  . LYMPHADENECTOMY Bilateral 11/06/2014   Procedure: PELVIC LYMPHADENECTOMY;  Surgeon: [name], MD;  Location: [facility];  Service: Urology;  Laterality: Bilateral;  . PACEMAKER INSERTION  2000; 2012   MDT dual chamber pacemaker implanted 2000 with gen change 2012  . POLYPECTOMY  12/30/2017   Procedure: POLYPECTOMY;  Surgeon: Ladene Artist, MD;  Location: [facility];  Service: Endoscopy;;  . PROSTATE BIOPSY  09/05/14  . ROBOT ASSISTED LAPAROSCOPIC RADICAL PROSTATECTOMY N/A 11/06/2014   Procedure: ROBOTIC ASSISTED LAPAROSCOPIC RADICAL PROSTATECTOMY LEVEL 2;  Surgeon: [name], MD;  Location: [facility];  Service: Urology;  Laterality: N/A;  . TOTAL HIP ARTHROPLASTY Left 07/03/2014   Procedure: LEFT TOTAL HIP ARTHROPLASTY ANTERIOR APPROACH;  Surgeon: [name], MD;  Location: [facility];  Service: Orthopedics;  Laterality: Left;  . TOTAL HIP ARTHROPLASTY Right 01/05/2018   Procedure: RIGHT TOTAL HIP ARTHROPLASTY ANTERIOR APPROACH;  Surgeon: Paralee Cancel, MD;  Location: [facility];  Service: Orthopedics;  Laterality: Right;  70 mins  . WISDOM TEETH EXTRACTIONS     Family History  Problem Relation Age of Onset  . Stroke Mother        multiple. 11.   Marland Kitchen  Heart disease Mother        hx heart valve problem  . Prostate cancer Father        21 of prostate cancer  . Heart attack Neg Hx   . Hypertension Neg Hx    Social History   Socioeconomic History  . Marital status: Married    Spouse name: Not on file  . Number of children: 4  . Years of education: Not on file  . Highest education level: Not on file  Occupational History  . Occupation: attorney  Social Needs  . Financial resource  strain: Not on file  . Food insecurity    Worry: Not on file    Inability: Not on file  . Transportation needs    Medical: Not on file    Non-medical: Not on file  Tobacco Use  . Smoking status: Never Smoker  . Smokeless tobacco: Never Used  Substance and Sexual Activity  . Alcohol use: Yes    Alcohol/week: 2.0 - 3.0 standard drinks    Types: 2 - 3 Standard drinks or equivalent per week    Comment: 5 TO 7 DRINKS A WEEK stopped etoh use pending surgery   . Drug use: No  . Sexual activity: Yes  Lifestyle  . Physical activity    Days per week: Not on file    Minutes per session: Not on file  . Stress: Not on file  Relationships  . Social Herbalist on phone: Not on file    Gets together: Not on file    Attends religious service: Not on file    Active member of club or organization: Not on file    Attends meetings of clubs or organizations: Not on file    Relationship status: Not on file  Other Topics Concern  . Not on file  Social History Narrative   Married to Dr. [name] 1979. 4 kids (1 lost to auto accident-3 surviving), no grandkids. 1 daughter married in 2016 and works as Forensic psychologist in [place].       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 N  Vision? N  Difficulty concentrating or making decisions? N  Walking or climbing stairs? N  Dressing or bathing? N  Doing errands, shopping? N  Preparing Food and eating ? N   Using the Toilet? N  In the past six months, have you accidently leaked urine? N  Do you have problems with loss of bowel control? N  Managing your Medications? N  Managing your Finances? N  Housekeeping or managing your Housekeeping? N  Some recent data might be hidden     Immunizations and Health Maintenance Immunization History  Administered Date(s) Administered  . Influenza Split 06/06/2011, 05/13/2012  . Influenza Whole 05/24/2008, 07/05/2009, 05/21/2010  . Influenza, High Dose Seasonal PF 05/05/2014, 05/11/2015, 05/23/2016, 04/24/2017, 05/07/2018, 03/24/2019  . Influenza,inj,Quad PF,6+ Mos 05/13/2013  . Pneumococcal Conjugate-13 05/29/2014  .  Pneumococcal Polysaccharide-23 05/15/2017  . Td 03/28/2009  . Zoster 06/10/2013   Health Maintenance Due  Topic Date Due  . TETANUS/TDAP  03/29/2019    Patient Care Team: [name], MD as PCP - General (Family Medicine) [name], MD as PCP - Cardiology (Cardiology) [name], NP as Nurse Practitioner (Cardiology) [facility] as Consulting Physician [name], [name], MD as Consulting Physician (Medical Oncology)  Indicate any recent Medical Services you may have received from other than [facility] providers in the past year (date may be approximate).    Assessment:   This is a routine wellness examination for Devin Mercado.  Hearing/Vision screen No exam data present  Dietary issues and exercise activities discussed: Current Exercise Habits: The patient does not participate in regular exercise at present(plans to restart gym exercise soon)  Goals   None    Depression Screen PHQ 2/9 Scores 04/15/2019 05/15/2017 06/12/2015 03/06/2015  PHQ - 2 Score 0 0 0 0    Fall Risk Fall Risk  04/15/2019 05/15/2017 06/12/2015 05/29/2014  Falls in the past year? 0 No No No  Number falls in past yr: 0 - - -  Injury with Fall? 0 - - -    Is the patient's home free of loose throw rugs in walkways, pet beds,  electrical cords, etc?   yes      Grab bars in the bathroom? yes      Handrails on the stairs?   yes      Adequate lighting?   yes  Timed Get Up and Go performed: completed and within normal timeframe; no gait abnormalities noted   Cognitive Function: no cognitive concerns at this time    6CIT Screen 04/15/2019  What Year? 0 points  What month? 0 points  What time? 0 points  Count back from 20 0 points  Months in reverse 0 points  Repeat phrase 0 points  Total Score 0    Screening Tests Health Maintenance  Topic Date Due  . TETANUS/TDAP  03/29/2019  . COLONOSCOPY  12/30/2020  . INFLUENZA VACCINE  Completed  . Hepatitis C Screening  Completed  . PNA vac Low Risk Adult  Completed    Qualifies for Shingles Vaccine? Discussed and patient will check with pharmacy for coverage.  Patient education handout provided   Cancer Screenings: Lung: Low Dose CT Chest recommended if Age 69-80 years, 30 pack-year currently smoking OR have quit w/in 15years. Patient does not qualify. Colorectal: colonoscopy 12/30/17 with Dr. Fuller Plan  Plan:  I have personally reviewed and addressed the Medicare Annual Wellness questionnaire and have noted the following in the patient's chart:  A. Medical and social history B. Use of alcohol, tobacco or illicit drugs  C. Current medications and supplements D. Functional ability and status E.  Nutritional status F.  Physical activity G. Advance directives H. List of other physicians I.  Hospitalizations, surgeries, and ER visits in previous 12 months J.  [place] such as hearing and vision if needed, cognitive and depression L. Referrals, records requested, and appointments- none   In addition, I have reviewed and discussed with patient certain preventive protocols, quality metrics, and best practice recommendations. A written personalized care plan for preventive services as well as general preventive health recommendations were provided to  patient.   Signed,  Denman George, LPN  Nurse Health Advisor   Nurse Notes: no additional

## 2019-04-15 NOTE — Patient Instructions (Signed)
[name] , Thank you for taking time to come for your Medicare Wellness Visit. I appreciate your ongoing commitment to your health goals. Please review the following plan we discussed and let me know if I can assist you in the future.   Screening recommendations/referrals: Colorectal Screening: completed last 12/30/17 with Dr. Fuller Plan  Vision and Dental Exams: Recommended annual ophthalmology exams for early detection of glaucoma and other disorders of the eye Recommended annual dental exams for proper oral hygiene  Vaccinations: Influenza vaccine: 03/24/19 Pneumococcal vaccine: up to date; last 05/15/17 Tdap vaccine:Please call your insurance company to determine your out of pocket expense or check at the pharmacy. You may receive the vaccine at the pharmacy.  Shingles vaccine: Please call your insurance company to determine your out of pocket expense for the Shingrix vaccine. You may receive this vaccine at your local pharmacy.  Advanced directives:Please bring a copy of your POA (Power of Attorney) and/or Living Will to your next appointment.  Goals: Recommend to drink at least 6-8 8oz glasses of water per day and incorporate more fresh fruits and vegetables into diet.   Next appointment: Please schedule your Annual Wellness Visit with your Nurse Health Advisor in one year.  Preventive Care 51 Years and Older, Male Preventive care refers to lifestyle choices and visits with your health care provider that can promote health and wellness. What does preventive care include?  A yearly physical exam. This is also called an annual well check.  Dental exams once or twice a year.  Routine eye exams. Ask your health care provider how often you should have your eyes checked.  Personal lifestyle choices, including:  Daily care of your teeth and gums.  Regular physical activity.  Eating a healthy diet.  Avoiding tobacco and drug use.  Limiting alcohol use.  Practicing safe sex.   Taking low doses of aspirin every day if recommended by your health care provider..  Taking vitamin and mineral supplements as recommended by your health care provider. What happens during an annual well check? The services and screenings done by your health care provider during your annual well check will depend on your age, overall health, lifestyle risk factors, and family history of disease. Counseling  Your health care provider may ask you questions about your:  Alcohol use.  Tobacco use.  Drug use.  Emotional well-being.  Home and relationship well-being.  Sexual activity.  Eating habits.  History of falls.  Memory and ability to understand (cognition).  Work and work Statistician. Screening  You may have the following tests or measurements:  Height, weight, and BMI.  Blood pressure.  Lipid and cholesterol levels. These may be checked every 5 years, or more frequently if you are over 36 years old.  Skin check.  Lung cancer screening. You may have this screening every year starting at age 58 if you have a 30-pack-year history of smoking and currently smoke or have quit within the past 15 years.  Fecal occult blood test (FOBT) of the stool. You may have this test every year starting at age 32.  Flexible sigmoidoscopy or colonoscopy. You may have a sigmoidoscopy every 5 years or a colonoscopy every 10 years starting at age 102.  Prostate cancer screening. Recommendations will vary depending on your family history and other risks.  Hepatitis C blood test.  Hepatitis B blood test.  Sexually transmitted disease (STD) testing.  Diabetes screening. This is done by checking your blood sugar (glucose) after you have not eaten  for a while (fasting). You may have this done every 1-3 years.  Abdominal aortic aneurysm (AAA) screening. You may need this if you are a current or former smoker.  Osteoporosis. You may be screened starting at age 26 if you are at high risk.  Talk with your health care provider about your test results, treatment options, and if necessary, the need for more tests. Vaccines  Your health care provider may recommend certain vaccines, such as:  Influenza vaccine. This is recommended every year.  Tetanus, diphtheria, and acellular pertussis (Tdap, Td) vaccine. You may need a Td booster every 10 years.  Zoster vaccine. You may need this after age 55.  Pneumococcal 13-valent conjugate (PCV13) vaccine. One dose is recommended after age 74.  Pneumococcal polysaccharide (PPSV23) vaccine. One dose is recommended after age 62. Talk to your health care provider about which screenings and vaccines you need and how often you need them. This information is not intended to replace advice given to you by your health care provider. Make sure you discuss any questions you have with your health care provider. Document Released: 07/27/2015 Document Revised: 03/19/2016 Document Reviewed: 05/01/2015 Elsevier Interactive Patient Education  2017 [place] Prevention in the Home Falls can cause injuries. They can happen to people of all ages. There are many things you can do to make your home safe and to help prevent falls. What can I do on the outside of my home?  Regularly fix the edges of walkways and driveways and fix any cracks.  Remove anything that might make you trip as you walk through a door, such as a raised step or threshold.  Trim any bushes or trees on the path to your home.  Use bright outdoor lighting.  Clear any walking paths of anything that might make someone trip, such as rocks or tools.  Regularly check to see if handrails are loose or broken. Make sure that both sides of any steps have handrails.  Any raised decks and porches should have guardrails on the edges.  Have any leaves, snow, or ice cleared regularly.  Use sand or salt on walking paths during winter.  Clean up any spills in your garage right away.  This includes oil or grease spills. What can I do in the bathroom?  Use night lights.  Install grab bars by the toilet and in the tub and shower. Do not use towel bars as grab bars.  Use non-skid mats or decals in the tub or shower.  If you need to sit down in the shower, use a plastic, non-slip stool.  Keep the floor dry. Clean up any water that spills on the floor as soon as it happens.  Remove soap buildup in the tub or shower regularly.  Attach bath mats securely with double-sided non-slip rug tape.  Do not have throw rugs and other things on the floor that can make you trip. What can I do in the bedroom?  Use night lights.  Make sure that you have a light by your bed that is easy to reach.  Do not use any sheets or blankets that are too big for your bed. They should not hang down onto the floor.  Have a firm chair that has side arms. You can use this for support while you get dressed.  Do not have throw rugs and other things on the floor that can make you trip. What can I do in the kitchen?  Clean up any spills right  away.  Avoid walking on wet floors.  Keep items that you use a lot in easy-to-reach places.  If you need to reach something above you, use a strong step stool that has a grab bar.  Keep electrical cords out of the way.  Do not use floor polish or wax that makes floors slippery. If you must use wax, use non-skid floor wax.  Do not have throw rugs and other things on the floor that can make you trip. What can I do with my stairs?  Do not leave any items on the stairs.  Make sure that there are handrails on both sides of the stairs and use them. Fix handrails that are broken or loose. Make sure that handrails are as long as the stairways.  Check any carpeting to make sure that it is firmly attached to the stairs. Fix any carpet that is loose or worn.  Avoid having throw rugs at the top or bottom of the stairs. If you do have throw rugs, attach them  to the floor with carpet tape.  Make sure that you have a light switch at the top of the stairs and the bottom of the stairs. If you do not have them, ask someone to add them for you. What else can I do to help prevent falls?  Wear shoes that:  Do not have high heels.  Have rubber bottoms.  Are comfortable and fit you well.  Are closed at the toe. Do not wear sandals.  If you use a stepladder:  Make sure that it is fully opened. Do not climb a closed stepladder.  Make sure that both sides of the stepladder are locked into place.  Ask someone to hold it for you, if possible.  Clearly mark and make sure that you can see:  Any grab bars or handrails.  First and last steps.  Where the edge of each step is.  Use tools that help you move around (mobility aids) if they are needed. These include:  Canes.  Walkers.  Scooters.  Crutches.  Turn on the lights when you go into a dark area. Replace any light bulbs as soon as they burn out.  Set up your furniture so you have a clear path. Avoid moving your furniture around.  If any of your floors are uneven, fix them.  If there are any pets around you, be aware of where they are.  Review your medicines with your doctor. Some medicines can make you feel dizzy. This can increase your chance of falling. Ask your doctor what other things that you can do to help prevent falls. This information is not intended to replace advice given to you by your health care provider. Make sure you discuss any questions you have with your health care provider. Document Released: 04/26/2009 Document Revised: 12/06/2015 Document Reviewed: 08/04/2014 Elsevier Interactive Patient Education  2017 [employer].

## 2019-04-26 ENCOUNTER — Encounter: Payer: Self-pay | Admitting: Family Medicine

## 2019-06-14 ENCOUNTER — Ambulatory Visit (INDEPENDENT_AMBULATORY_CARE_PROVIDER_SITE_OTHER): Payer: Medicare Other | Admitting: *Deleted

## 2019-06-14 DIAGNOSIS — I442 Atrioventricular block, complete: Secondary | ICD-10-CM | POA: Diagnosis not present

## 2019-06-14 LAB — CUP PACEART REMOTE DEVICE CHECK
Battery Impedance: 932 Ohm
Battery Remaining Longevity: 61 mo
Battery Voltage: 2.77 V
Brady Statistic AP VP Percent: 8 %
Brady Statistic AP VS Percent: 0 %
Brady Statistic AS VP Percent: 92 %
Brady Statistic AS VS Percent: 0 %
Date Time Interrogation Session: 20201201071808
Implantable Lead Implant Date: 20000731
Implantable Lead Implant Date: 20000731
Implantable Lead Location: 753859
Implantable Lead Location: 753860
Implantable Lead Model: 5092
Implantable Pulse Generator Implant Date: 20121026
Lead Channel Impedance Value: 365 Ohm
Lead Channel Impedance Value: 563 Ohm
Lead Channel Pacing Threshold Amplitude: 1 V
Lead Channel Pacing Threshold Amplitude: 1.125 V
Lead Channel Pacing Threshold Pulse Width: 0.4 ms
Lead Channel Pacing Threshold Pulse Width: 0.4 ms
Lead Channel Setting Pacing Amplitude: 2 V
Lead Channel Setting Pacing Amplitude: 2.5 V
Lead Channel Setting Pacing Pulse Width: 0.4 ms
Lead Channel Setting Sensing Sensitivity: 2.8 mV

## 2019-07-05 ENCOUNTER — Other Ambulatory Visit: Payer: Self-pay

## 2019-07-05 ENCOUNTER — Ambulatory Visit: Payer: Medicare Other | Attending: Internal Medicine

## 2019-07-05 DIAGNOSIS — Z20822 Contact with and (suspected) exposure to covid-19: Secondary | ICD-10-CM

## 2019-07-07 LAB — NOVEL CORONAVIRUS, NAA: SARS-CoV-2, NAA: DETECTED — AB

## 2019-07-09 NOTE — Progress Notes (Signed)
PPM remote 

## 2019-07-28 DIAGNOSIS — C61 Malignant neoplasm of prostate: Secondary | ICD-10-CM | POA: Diagnosis not present

## 2019-07-28 DIAGNOSIS — Z79818 Long term (current) use of other agents affecting estrogen receptors and estrogen levels: Secondary | ICD-10-CM | POA: Diagnosis not present

## 2019-09-13 ENCOUNTER — Ambulatory Visit (INDEPENDENT_AMBULATORY_CARE_PROVIDER_SITE_OTHER): Payer: Medicare PPO | Admitting: *Deleted

## 2019-09-13 DIAGNOSIS — I442 Atrioventricular block, complete: Secondary | ICD-10-CM

## 2019-09-13 LAB — CUP PACEART REMOTE DEVICE CHECK
Battery Impedance: 1011 Ohm
Battery Remaining Longevity: 55 mo
Battery Voltage: 2.77 V
Brady Statistic AP VP Percent: 6 %
Brady Statistic AP VS Percent: 0 %
Brady Statistic AS VP Percent: 94 %
Brady Statistic AS VS Percent: 0 %
Date Time Interrogation Session: 20210302072624
Implantable Pulse Generator Implant Date: 20121026
Lead Channel Impedance Value: 351 Ohm
Lead Channel Impedance Value: 602 Ohm
Lead Channel Pacing Threshold Amplitude: 1.125 V
Lead Channel Pacing Threshold Amplitude: 1.375 V
Lead Channel Pacing Threshold Pulse Width: 0.4 ms
Lead Channel Pacing Threshold Pulse Width: 0.4 ms
Lead Channel Setting Pacing Amplitude: 2.25 V
Lead Channel Setting Pacing Amplitude: 2.75 V
Lead Channel Setting Pacing Pulse Width: 0.4 ms
Lead Channel Setting Sensing Sensitivity: 2.8 mV

## 2019-09-14 NOTE — Progress Notes (Signed)
PPM Remote  

## 2019-09-26 DIAGNOSIS — C7951 Secondary malignant neoplasm of bone: Secondary | ICD-10-CM | POA: Diagnosis not present

## 2019-09-26 DIAGNOSIS — Z9079 Acquired absence of other genital organ(s): Secondary | ICD-10-CM | POA: Diagnosis not present

## 2019-09-26 DIAGNOSIS — Z79818 Long term (current) use of other agents affecting estrogen receptors and estrogen levels: Secondary | ICD-10-CM | POA: Diagnosis not present

## 2019-09-26 DIAGNOSIS — K8020 Calculus of gallbladder without cholecystitis without obstruction: Secondary | ICD-10-CM | POA: Diagnosis not present

## 2019-09-26 DIAGNOSIS — C61 Malignant neoplasm of prostate: Secondary | ICD-10-CM | POA: Diagnosis not present

## 2019-09-29 DIAGNOSIS — Z79818 Long term (current) use of other agents affecting estrogen receptors and estrogen levels: Secondary | ICD-10-CM | POA: Diagnosis not present

## 2019-09-29 DIAGNOSIS — F419 Anxiety disorder, unspecified: Secondary | ICD-10-CM | POA: Diagnosis not present

## 2019-09-29 DIAGNOSIS — N644 Mastodynia: Secondary | ICD-10-CM | POA: Diagnosis not present

## 2019-09-29 DIAGNOSIS — C61 Malignant neoplasm of prostate: Secondary | ICD-10-CM | POA: Diagnosis not present

## 2019-09-29 DIAGNOSIS — C7951 Secondary malignant neoplasm of bone: Secondary | ICD-10-CM | POA: Diagnosis not present

## 2019-09-29 DIAGNOSIS — R232 Flushing: Secondary | ICD-10-CM | POA: Diagnosis not present

## 2019-09-29 DIAGNOSIS — Z9189 Other specified personal risk factors, not elsewhere classified: Secondary | ICD-10-CM | POA: Diagnosis not present

## 2019-09-29 DIAGNOSIS — I10 Essential (primary) hypertension: Secondary | ICD-10-CM | POA: Diagnosis not present

## 2019-09-29 DIAGNOSIS — R972 Elevated prostate specific antigen [PSA]: Secondary | ICD-10-CM | POA: Diagnosis not present

## 2019-10-12 ENCOUNTER — Telehealth: Payer: Self-pay | Admitting: Internal Medicine

## 2019-10-12 NOTE — Telephone Encounter (Signed)
Fax not received. Devin Mercado given [facility] fax [phone]. Patient has consut at [facility] tomorrow and will start radiation treatment in 1-2 weeks at [facility].

## 2019-10-12 NOTE — Telephone Encounter (Signed)
New message  Alyssa from [employer] is calling in to give the office a heads up that an oncology form is going to be refaxed today regarding patient's pacemaker to fax number [phone].

## 2019-10-13 DIAGNOSIS — C7951 Secondary malignant neoplasm of bone: Secondary | ICD-10-CM | POA: Diagnosis not present

## 2019-10-13 DIAGNOSIS — C7952 Secondary malignant neoplasm of bone marrow: Secondary | ICD-10-CM | POA: Diagnosis not present

## 2019-10-13 DIAGNOSIS — C61 Malignant neoplasm of prostate: Secondary | ICD-10-CM | POA: Diagnosis not present

## 2019-10-14 NOTE — Telephone Encounter (Signed)
Form completed and faxed back. Confirmation received.

## 2019-10-25 DIAGNOSIS — H52223 Regular astigmatism, bilateral: Secondary | ICD-10-CM | POA: Diagnosis not present

## 2019-10-25 DIAGNOSIS — C7951 Secondary malignant neoplasm of bone: Secondary | ICD-10-CM | POA: Diagnosis not present

## 2019-10-25 DIAGNOSIS — I10 Essential (primary) hypertension: Secondary | ICD-10-CM | POA: Diagnosis not present

## 2019-10-25 DIAGNOSIS — H524 Presbyopia: Secondary | ICD-10-CM | POA: Diagnosis not present

## 2019-10-25 DIAGNOSIS — H5213 Myopia, bilateral: Secondary | ICD-10-CM | POA: Diagnosis not present

## 2019-10-25 DIAGNOSIS — H35033 Hypertensive retinopathy, bilateral: Secondary | ICD-10-CM | POA: Diagnosis not present

## 2019-10-25 DIAGNOSIS — H2513 Age-related nuclear cataract, bilateral: Secondary | ICD-10-CM | POA: Diagnosis not present

## 2019-10-27 DIAGNOSIS — C7951 Secondary malignant neoplasm of bone: Secondary | ICD-10-CM | POA: Diagnosis not present

## 2019-10-27 DIAGNOSIS — C61 Malignant neoplasm of prostate: Secondary | ICD-10-CM | POA: Diagnosis not present

## 2019-12-29 DIAGNOSIS — Z79899 Other long term (current) drug therapy: Secondary | ICD-10-CM | POA: Diagnosis not present

## 2019-12-29 DIAGNOSIS — C7951 Secondary malignant neoplasm of bone: Secondary | ICD-10-CM | POA: Diagnosis not present

## 2019-12-29 DIAGNOSIS — F419 Anxiety disorder, unspecified: Secondary | ICD-10-CM | POA: Diagnosis not present

## 2019-12-29 DIAGNOSIS — Z13820 Encounter for screening for osteoporosis: Secondary | ICD-10-CM | POA: Diagnosis not present

## 2019-12-29 DIAGNOSIS — R232 Flushing: Secondary | ICD-10-CM | POA: Diagnosis not present

## 2019-12-29 DIAGNOSIS — R972 Elevated prostate specific antigen [PSA]: Secondary | ICD-10-CM | POA: Diagnosis not present

## 2019-12-29 DIAGNOSIS — Z79818 Long term (current) use of other agents affecting estrogen receptors and estrogen levels: Secondary | ICD-10-CM | POA: Diagnosis not present

## 2019-12-29 DIAGNOSIS — I10 Essential (primary) hypertension: Secondary | ICD-10-CM | POA: Diagnosis not present

## 2019-12-29 DIAGNOSIS — Z9189 Other specified personal risk factors, not elsewhere classified: Secondary | ICD-10-CM | POA: Diagnosis not present

## 2019-12-29 DIAGNOSIS — C61 Malignant neoplasm of prostate: Secondary | ICD-10-CM | POA: Diagnosis not present

## 2020-01-19 DIAGNOSIS — M48061 Spinal stenosis, lumbar region without neurogenic claudication: Secondary | ICD-10-CM | POA: Diagnosis not present

## 2020-01-19 DIAGNOSIS — M47816 Spondylosis without myelopathy or radiculopathy, lumbar region: Secondary | ICD-10-CM | POA: Diagnosis not present

## 2020-01-19 DIAGNOSIS — C7951 Secondary malignant neoplasm of bone: Secondary | ICD-10-CM | POA: Diagnosis not present

## 2020-01-19 DIAGNOSIS — M4807 Spinal stenosis, lumbosacral region: Secondary | ICD-10-CM | POA: Diagnosis not present

## 2020-01-19 DIAGNOSIS — C61 Malignant neoplasm of prostate: Secondary | ICD-10-CM | POA: Diagnosis not present

## 2020-03-13 ENCOUNTER — Encounter: Payer: Self-pay | Admitting: Family Medicine

## 2020-03-14 NOTE — Progress Notes (Signed)
Phone [phone] In person visit   Subjective:   Devin Mercado is a 69 y.o. year old very pleasant male patient who presents for/with See problem oriented charting Chief Complaint  Patient presents with  . Hematuria    This visit occurred during the SARS-CoV-2 public health emergency.  Safety protocols were in place, including screening questions prior to the visit, additional usage of staff PPE, and extensive cleaning of exam room while observing appropriate contact time as indicated for disinfecting solutions.   Past Medical History-  Patient Active Problem List   Diagnosis Date Noted  . Prostate cancer ([place]) 09/12/2014    Priority: High  . PPM-Medtronic 05/23/2010    Priority: High  . Atrioventricular block, complete ([place]) 03/28/2009    Priority: High  . History of adenomatous polyp of colon 04/15/2019    Priority: Medium  . Hyperglycemia 06/12/2015    Priority: Medium  . Hyperlipidemia 03/28/2009    Priority: Medium  . Essential hypertension 03/16/2008    Priority: Medium  . S/P right THA, AA 01/05/2018    Priority: Low  . Obese 07/04/2014    Priority: Low  . S/P left THA, AA 07/03/2014    Priority: Low  . Palpitations 03/15/2019  . Positive colorectal cancer screening using Cologuard test     Medications- reviewed and updated Current Outpatient Medications  Medication Sig Dispense Refill  . aspirin EC 81 MG tablet Take 81 mg by mouth daily.    Marland Kitchen atorvastatin (LIPITOR) 20 MG tablet Take 1 tablet (20 mg total) by mouth daily. 90 tablet 3  . Darolutamide (NUBEQA) 300 MG TABS Take by mouth.    Marland Kitchen leuprolide, 6 Month, (ELIGARD) 45 MG injection Inject 45 mg into the skin every 6 (six) months.    . valsartan-hydrochlorothiazide (DIOVAN HCT) 160-12.5 MG tablet Take 1 tablet by mouth daily. 90 tablet 3   No current facility-administered medications for this visit.     Objective:  BP 136/82   Pulse 73   Temp (!) 97.5 F (36.4 C) (Temporal)   Ht 5\' 11"  (1.803  m)   Wt 253 lb (114.8 kg)   BMI 35.29 kg/m  Gen: NAD, resting comfortably CV: RRR no murmurs rubs or gallops Lungs: CTAB no crackles, wheeze, rhonchi Abdomen: soft/nontender/nondistended/normal bowel sounds.  Ext: no edema  Results for orders placed or performed in visit on 03/15/20 (from the past 24 hour(s))  POCT Urinalysis Dipstick (Automated)     Status: None   Collection Time: 03/15/20  8:31 AM  Result Value Ref Range   Color, UA clear bright yellow    Clarity, UA     Glucose, UA Negative Negative   Bilirubin, UA neg    Ketones, UA neg    Spec Grav, UA 1.025 1.010 - 1.025   Blood, UA 10    pH, UA 5.5 5.0 - 8.0   Protein, UA Negative Negative   Urobilinogen, UA 0.2 0.2 or 1.0 E.U./dL   Nitrite, UA neg    Leukocytes, UA Negative Negative       Assessment and Plan   #bloodwork planned at [place] [facility]  so we opted out today. Asked to seee if they can do an a1c  Blood in Urine S: started in June. Has had 3 episodes that are a few weeks apart. He will notice in once each time in urine. Denies any frequency or urgency. Passes a blood clot small in size and by next urination resolves.  A/P: 69 year old male  with prostate cancer removed by robot-assisted prostatectomy in 2016 presenting with recent gross hematuria x3 over the last few months.  I told patient this could simply be due to prior surgery as well as radiation treatment recently but I thought we needed urology's expert opinion with possible CT imaging and cystoscopy.  Referral was placed back to Dr. Diona Fanti who saw him several years ago -For prostate cancer continue oncology follow-up-he is on Nubeqa and Eligard -UA reassuring-I doubt UTI would cause solely blood clots so we did not culture today or check urine microscopic due to known gross hematuria  #hypertension S: medication: lisinopril 20mg  and hctz 12.5mg  at night for both. having dry cough.  BP Readings from Last 3 Encounters:  03/15/20 136/82   04/15/19 128/82  04/15/19 128/82  A/P: Dry cough on lisinopril.  We will stop lisinopril and hydrochlorothiazide.  Start valsartan 160-hydrochlorothiazide 12.5 mg combo tablet.  He will monitor blood pressure at home and follow-up in 4 to 6 months or sooner if blood pressure is elevated  #hyperlipidemia S: Medication: atorvastatin 20mg  Lab Results  Component Value Date   CHOL 155 04/15/2019   HDL 60.30 04/15/2019   LDLCALC 75 04/15/2019   LDLDIRECT 168.8 05/13/2013   TRIG 102.0 04/15/2019   CHOLHDL 3 04/15/2019   A/P: Very close to ideal goal of 70 or less last year at 73-weight has trended up-encouraged healthy eating/regular exercise and we can recheck next visit  # Hyperglycemia/insulin resistance/prediabetes S:  Medication:  none Exercise and diet- tougher with gym closed Lab Results  Component Value Date   HGBA1C 6.2 04/15/2019   HGBA1C 5.6 10/05/2007   A/P: Weight has trended up-concern A1c may be elevated-we will get this checked at [place] hopefully and if not we will check at next visit here  Recommended follow up: Physical planned in October Future Appointments  Date Time Provider [place]  04/02/2020  4:00 PM [name], MD [facility]  05/01/2020  2:20 PM [name], MD [facility]  06/12/2020  8:20 AM [facility] DEVICE REMOTES [facility]    Lab/Order associations:   ICD-10-CM   1. Hematuria, unspecified type  R31.9 POCT Urinalysis Dipstick (Automated)    CANCELED: Urine Microscopic    CANCELED: Urine Microscopic    CANCELED: Urine Microscopic  2. Gross hematuria  R31.0 Ambulatory referral to Urology  3. Prostate cancer [facility])  [place] Ambulatory referral to Urology  4. Hyperlipidemia, unspecified hyperlipidemia type  E78.5   5. Essential hypertension  I10   6. Hyperglycemia  R73.9     Meds ordered this encounter  Medications  . valsartan-hydrochlorothiazide (DIOVAN HCT) 160-12.5 MG tablet    Sig: Take 1 tablet  by mouth daily.    Dispense:  90 tablet    Refill:  3   Return precautions advised.  Garret Reddish, MD

## 2020-03-14 NOTE — Patient Instructions (Addendum)
Health Maintenance Due  Topic Date Due  . Devin Mercado will get at pharmacy  03/29/2019  . INFLUENZA VACCINE -.  We do not yet have the high-dose flu shot-I would recommend this either at the pharmacy or call back and check with [place] in a month or so 02/12/2020   We will call you within two weeks about your referral to urology. If you do not hear within 3 weeks, give [place] a call.   Stop lisinopril 20 mg and hydrochlorothiazide 12.5 mg.  We are going to put you on valsartan 160 mg in combination with hydrochlorothiazide 12.5 mg and 1 pill-take this daily.  I am hoping the cough will resolve off lisinopril-lisinopril is a common trigger for dry cough  Schedule next available physical in 4-6 months so we can recheck blood in office - try to keep an eye on it at home and if above 140/90 please let us know.   For your September labs- see if at all possible if they can do an a1c undre hyperglycemia/prediabetes- last one was 6.2 so want to keep an eye on this- healthy eating/regular exercise advised

## 2020-03-15 ENCOUNTER — Ambulatory Visit: Payer: Medicare PPO | Admitting: Family Medicine

## 2020-03-15 ENCOUNTER — Other Ambulatory Visit: Payer: Self-pay

## 2020-03-15 ENCOUNTER — Encounter: Payer: Self-pay | Admitting: Family Medicine

## 2020-03-15 VITALS — BP 136/82 | HR 73 | Temp 97.5°F | Ht 71.0 in | Wt 253.0 lb

## 2020-03-15 DIAGNOSIS — E785 Hyperlipidemia, unspecified: Secondary | ICD-10-CM | POA: Diagnosis not present

## 2020-03-15 DIAGNOSIS — R319 Hematuria, unspecified: Secondary | ICD-10-CM

## 2020-03-15 DIAGNOSIS — I10 Essential (primary) hypertension: Secondary | ICD-10-CM | POA: Diagnosis not present

## 2020-03-15 DIAGNOSIS — C61 Malignant neoplasm of prostate: Secondary | ICD-10-CM | POA: Diagnosis not present

## 2020-03-15 DIAGNOSIS — R739 Hyperglycemia, unspecified: Secondary | ICD-10-CM

## 2020-03-15 DIAGNOSIS — R310 Gross hematuria: Secondary | ICD-10-CM

## 2020-03-15 LAB — POC URINALSYSI DIPSTICK (AUTOMATED)
Bilirubin, UA: NEGATIVE
Blood, UA: 10
Glucose, UA: NEGATIVE
Ketones, UA: NEGATIVE
Leukocytes, UA: NEGATIVE
Nitrite, UA: NEGATIVE
Protein, UA: NEGATIVE
Spec Grav, UA: 1.025 (ref 1.010–1.025)
Urobilinogen, UA: 0.2 E.U./dL
pH, UA: 5.5 (ref 5.0–8.0)

## 2020-03-15 MED ORDER — VALSARTAN-HYDROCHLOROTHIAZIDE 160-12.5 MG PO TABS: 1.0000 | ORAL_TABLET | Freq: Every day | ORAL | 3 refills | Status: DC

## 2020-03-26 DIAGNOSIS — C61 Malignant neoplasm of prostate: Secondary | ICD-10-CM | POA: Diagnosis not present

## 2020-03-26 DIAGNOSIS — R972 Elevated prostate specific antigen [PSA]: Secondary | ICD-10-CM | POA: Diagnosis not present

## 2020-03-26 DIAGNOSIS — Z09 Encounter for follow-up examination after completed treatment for conditions other than malignant neoplasm: Secondary | ICD-10-CM | POA: Diagnosis not present

## 2020-03-26 DIAGNOSIS — Z79818 Long term (current) use of other agents affecting estrogen receptors and estrogen levels: Secondary | ICD-10-CM | POA: Diagnosis not present

## 2020-03-26 DIAGNOSIS — Z192 Hormone resistant malignancy status: Secondary | ICD-10-CM | POA: Diagnosis not present

## 2020-03-26 DIAGNOSIS — C7951 Secondary malignant neoplasm of bone: Secondary | ICD-10-CM | POA: Diagnosis not present

## 2020-03-26 DIAGNOSIS — R232 Flushing: Secondary | ICD-10-CM | POA: Diagnosis not present

## 2020-03-26 DIAGNOSIS — I10 Essential (primary) hypertension: Secondary | ICD-10-CM | POA: Diagnosis not present

## 2020-03-26 DIAGNOSIS — N644 Mastodynia: Secondary | ICD-10-CM | POA: Diagnosis not present

## 2020-03-26 DIAGNOSIS — Z23 Encounter for immunization: Secondary | ICD-10-CM | POA: Diagnosis not present

## 2020-04-02 ENCOUNTER — Other Ambulatory Visit: Payer: Self-pay

## 2020-04-02 ENCOUNTER — Ambulatory Visit: Payer: Medicare PPO | Admitting: Internal Medicine

## 2020-04-02 ENCOUNTER — Encounter: Payer: Self-pay | Admitting: Internal Medicine

## 2020-04-02 VITALS — BP 136/80 | HR 78 | Ht 71.0 in | Wt 234.8 lb

## 2020-04-02 DIAGNOSIS — I442 Atrioventricular block, complete: Secondary | ICD-10-CM | POA: Diagnosis not present

## 2020-04-02 DIAGNOSIS — Z950 Presence of cardiac pacemaker: Secondary | ICD-10-CM | POA: Diagnosis not present

## 2020-04-02 DIAGNOSIS — R002 Palpitations: Secondary | ICD-10-CM | POA: Diagnosis not present

## 2020-04-02 LAB — CUP PACEART INCLINIC DEVICE CHECK
Battery Impedance: 1194 Ohm
Battery Remaining Longevity: 49 mo
Battery Voltage: 2.77 V
Brady Statistic AP VP Percent: 7 %
Brady Statistic AP VS Percent: 0 %
Brady Statistic AS VP Percent: 93 %
Brady Statistic AS VS Percent: 0 %
Date Time Interrogation Session: 20210920165029
Implantable Lead Implant Date: 20000731
Implantable Lead Implant Date: 20000731
Implantable Lead Location: 753859
Implantable Lead Location: 753860
Implantable Lead Model: 5092
Implantable Pulse Generator Implant Date: 20121026
Lead Channel Impedance Value: 365 Ohm
Lead Channel Impedance Value: 597 Ohm
Lead Channel Pacing Threshold Amplitude: 1 V
Lead Channel Pacing Threshold Amplitude: 1.25 V
Lead Channel Pacing Threshold Pulse Width: 0.4 ms
Lead Channel Pacing Threshold Pulse Width: 0.4 ms
Lead Channel Sensing Intrinsic Amplitude: 4 mV
Lead Channel Setting Pacing Amplitude: 2.25 V
Lead Channel Setting Pacing Amplitude: 2.75 V
Lead Channel Setting Pacing Pulse Width: 0.4 ms
Lead Channel Setting Sensing Sensitivity: 2.8 mV

## 2020-04-02 NOTE — Patient Instructions (Signed)
Medication Instructions:  °Your physician recommends that you continue on your current medications as directed. Please refer to the Current Medication list given to you today. ° °Labwork: °None ordered. ° °Testing/Procedures: °None ordered. ° °Follow-Up: °Your physician wants you to follow-up in: one year with Dr. Taylor.   You will receive a reminder letter in the mail two months in advance. If you don't receive a letter, please call our office to schedule the follow-up appointment. ° °Remote monitoring is used to monitor your Pacemaker from home. This monitoring reduces the number of office visits required to check your device to one time per year. It allows us to keep an eye on the functioning of your device to ensure it is working properly. You are scheduled for a device check from home on 06/12/2020. You may send your transmission at any time that day. If you have a wireless device, the transmission will be sent automatically. After your physician reviews your transmission, you will receive a postcard with your next transmission date. ° °Any Other Special Instructions Will Be Listed Below (If Applicable). ° °If you need a refill on your cardiac medications before your next appointment, please call your pharmacy.  ° °

## 2020-04-02 NOTE — Progress Notes (Signed)
HPI [name] returns today for followup. He is a pleasant 69 yo man with CHB and HTN, s/p PPM insertion. He has prostate CA with spinal mets. He feels well. He denies chest pain or sob or peripheral edema. He plans to retire in the next few months.  No Known Allergies   Current Outpatient Medications  Medication Sig Dispense Refill  . aspirin EC 81 MG tablet Take 81 mg by mouth daily.    Marland Kitchen atorvastatin (LIPITOR) 20 MG tablet Take 1 tablet (20 mg total) by mouth daily. 90 tablet 3  . Darolutamide (NUBEQA) 300 MG TABS Take by mouth.    Marland Kitchen leuprolide, 6 Month, (ELIGARD) 45 MG injection Inject 45 mg into the skin every 6 (six) months.    . valsartan-hydrochlorothiazide (DIOVAN HCT) 160-12.5 MG tablet Take 1 tablet by mouth daily. 90 tablet 3   No current facility-administered medications for this visit.     Past Medical History:  Diagnosis Date  . Arthritis    OA AND PAIN LEFT HIP  . CHB (complete heart block) ([place])    a. MDT dual chamber pacemaker  . HTN (hypertension)   . Hyperlipidemia   . Presence of permanent cardiac pacemaker    2000  . Prostate cancer ([place]) 09/2014    ROS:   All systems reviewed and negative except as noted in the HPI.   Past Surgical History:  Procedure Laterality Date  . ADENOIDECTOMY AS A CHILD    . COLONOSCOPY WITH PROPOFOL N/A 12/30/2017   Procedure: COLONOSCOPY WITH PROPOFOL;  Surgeon: Ladene Artist, MD;  Location: [facility];  Service: Endoscopy;  Laterality: N/A;  . JOINT REPLACEMENT     Right hip replacement Dr. Alvan Dame 01-05-18  . LYMPHADENECTOMY Bilateral 11/06/2014   Procedure: PELVIC LYMPHADENECTOMY;  Surgeon: [name], MD;  Location: [facility];  Service: Urology;  Laterality: Bilateral;  . PACEMAKER INSERTION  2000; 2012   MDT dual chamber pacemaker implanted 2000 with gen change 2012  . POLYPECTOMY  12/30/2017   Procedure: POLYPECTOMY;  Surgeon: Ladene Artist, MD;  Location: [facility];  Service: Endoscopy;;  .  PROSTATE BIOPSY  09/05/14  . ROBOT ASSISTED LAPAROSCOPIC RADICAL PROSTATECTOMY N/A 11/06/2014   Procedure: ROBOTIC ASSISTED LAPAROSCOPIC RADICAL PROSTATECTOMY LEVEL 2;  Surgeon: [name], MD;  Location: [facility];  Service: Urology;  Laterality: N/A;  . TOTAL HIP ARTHROPLASTY Left 07/03/2014   Procedure: LEFT TOTAL HIP ARTHROPLASTY ANTERIOR APPROACH;  Surgeon: [name], MD;  Location: [facility];  Service: Orthopedics;  Laterality: Left;  . TOTAL HIP ARTHROPLASTY Right 01/05/2018   Procedure: RIGHT TOTAL HIP ARTHROPLASTY ANTERIOR APPROACH;  Surgeon: Paralee Cancel, MD;  Location: [facility];  Service: Orthopedics;  Laterality: Right;  70 mins  . WISDOM TEETH EXTRACTIONS       Family History  Problem Relation Age of Onset  . Stroke Mother        multiple. 57.   . Heart disease Mother        hx heart valve problem  . Prostate cancer Father        72 of prostate cancer  . Heart attack Neg Hx   . Hypertension Neg Hx      Social History   Socioeconomic History  . Marital status: Married    Spouse name: Not on file  . Number of children: 4  . Years of education: Not on file  . Highest education level: Not on file  Occupational History  .  Occupation: attorney  Tobacco Use  . Smoking status: Never Smoker  . Smokeless tobacco: Never Used  Vaping Use  . Vaping Use: Never used  Substance and Sexual Activity  . Alcohol use: Yes    Alcohol/week: 2.0 - 3.0 standard drinks    Types: 2 - 3 Standard drinks or equivalent per week    Comment: 5 TO 7 DRINKS A WEEK stopped etoh use pending surgery   . Drug use: No  . Sexual activity: Yes  Other Topics Concern  . Not on file  Social History Narrative   Married to Dr. [name] 1979. 4 kids (1 lost to auto accident-3 surviving), no grandkids. 1 daughter married in 2016 and works as Forensic psychologist in [place].       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  Difficulty of Paying Living Expenses: Not on file  Food Insecurity:   . Worried About Charity fundraiser in the Last Year: Not on file  . Ran Out of Food in the Last Year: Not on file  Transportation Needs:   . Lack of Transportation (Medical): Not on file  . Lack of Transportation (Non-Medical): Not on file  Physical Activity:   . Days of Exercise per Week: Not on file  . Minutes of Exercise per Session: Not on file  Stress:   . Feeling of Stress : Not on file  Social Connections:   . Frequency of Communication with Friends and Family: Not on file  . Frequency of Social Gatherings with Friends and Family: Not on file  . Attends Religious Services: Not on file  . Active Member of Clubs or Organizations: Not on file  . Attends Archivist Meetings: Not on file  . Marital Status: Not on file  Intimate Partner Violence:   . Fear of Current or Ex-Partner: Not on file  . Emotionally Abused: Not on file  . Physically Abused: Not on file  . Sexually Abused: Not on file     BP 136/80   Pulse 78   Ht 5\' 11"  (1.803 m)   Wt 234 lb 12.8 oz (106.5 kg)   SpO2 98%   BMI 32.75 kg/m   Physical Exam:  Well appearing NAD HEENT: Unremarkable Neck:  No JVD, no thyromegally Lymphatics:  No adenopathy Back:  No CVA tenderness Lungs:  Clear with no wheezes HEART:  Regular rate rhythm, no murmurs, no rubs, no clicks Abd:  soft, positive bowel sounds, no organomegally, no rebound, no guarding Ext:  2 plus pulses, no edema, no cyanosis, no clubbing Skin:  No rashes no nodules Neuro:  CN II through XII intact, motor grossly intact  EKG - nsr with ventricular pacing  DEVICE  Normal device function.  See PaceArt for details.   Assess/Plan: 1. CHB - he is asymptomatic, s/p PPM insertion. 2. PPM - his medtronic DDD PM is working normally. 3. HTN - his bp is minimally elevated. No change in his meds.  Carleene Overlie Anyela Napierkowski,MD

## 2020-04-04 NOTE — Addendum Note (Signed)
Addended by: Bobby Rumpf C on: 04/04/2020 11:59 AM   Modules accepted: Orders

## 2020-04-05 DIAGNOSIS — R310 Gross hematuria: Secondary | ICD-10-CM | POA: Diagnosis not present

## 2020-04-24 ENCOUNTER — Telehealth: Payer: Self-pay | Admitting: Family Medicine

## 2020-04-24 NOTE — Telephone Encounter (Signed)
Left message for patient to call back and schedule Medicare Annual Wellness Visit (AWV) either virtually/audio only OR in office. Whatever the patients preference is.  Last AWV 04/15/19; please schedule at anytime with [facility] at [facility].  This should be a 45 minute visit.

## 2020-04-27 ENCOUNTER — Other Ambulatory Visit: Payer: Self-pay | Admitting: Internal Medicine

## 2020-04-30 NOTE — Patient Instructions (Addendum)
Please stop by lab before you go If you have mychart- we will send your results within 3 business days of [place] receiving them.  If you do not have mychart- we will call you about results within 5 business days of [place] receiving them.  *please note we are currently using [facility] which has a longer processing time than [place] typically so labs may not come back as quickly as in the past *please also note that you will see labs on mychart as soon as they post. I will later go in and write notes on them- will say "notes from Dr. Yong Channel"  Health Maintenance Due  Topic Date Due  . TETANUS/TDAP - typically cheaper to get this done at pharmacy 03/29/2019   Could consider Shingrix at pharmacy- but would run by oncology  Have Devin Mercado look at spot on low back- I would like to refer to dermatology if you are both ok with that- if she prefers to monitor I also certainly will understand

## 2020-04-30 NOTE — Progress Notes (Signed)
Phone: [phone]   Subjective:  Patient presents today for their annual physical. Chief complaint-noted.   See problem oriented charting- ROS- full  review of systems was completed and negative  except for: blood in urine  The following were reviewed and entered/updated in epic: Past Medical History:  Diagnosis Date  . Arthritis    OA AND PAIN LEFT HIP  . CHB (complete heart block) ([place])    a. MDT dual chamber pacemaker  . HTN (hypertension)   . Hyperlipidemia   . Presence of permanent cardiac pacemaker    2000  . Prostate cancer ([place]) 09/2014   Patient Active Problem List   Diagnosis Date Noted  . Prostate cancer ([place]) 09/12/2014    Priority: High  . PPM-Medtronic 05/23/2010    Priority: High  . Atrioventricular block, complete ([place]) 03/28/2009    Priority: High  . History of adenomatous polyp of colon 04/15/2019    Priority: Medium  . Hyperglycemia 06/12/2015    Priority: Medium  . Hyperlipidemia 03/28/2009    Priority: Medium  . Essential hypertension 03/16/2008    Priority: Medium  . S/P right THA, AA 01/05/2018    Priority: Low  . Obese 07/04/2014    Priority: Low  . S/P left THA, AA 07/03/2014    Priority: Low  . Palpitations 03/15/2019  . Positive colorectal cancer screening using Cologuard test    Past Surgical History:  Procedure Laterality Date  . ADENOIDECTOMY AS A CHILD    . COLONOSCOPY WITH PROPOFOL N/A 12/30/2017   Procedure: COLONOSCOPY WITH PROPOFOL;  Surgeon: Ladene Artist, MD;  [facility];  Service: Endoscopy;  Laterality: N/A;  . JOINT REPLACEMENT     Right hip replacement Dr. Alvan Dame 01-05-18  . LYMPHADENECTOMY Bilateral 11/06/2014   Procedure: PELVIC LYMPHADENECTOMY;  Surgeon: [name], MD;  [facility];  Service: Urology;  Laterality: Bilateral;  . PACEMAKER INSERTION  2000; 2012   MDT dual chamber pacemaker implanted 2000 with gen change 2012  . POLYPECTOMY  12/30/2017   Procedure: POLYPECTOMY;  Surgeon: Ladene Artist, MD;  [facility];  Service: Endoscopy;;  . PROSTATE BIOPSY  09/05/14  . ROBOT ASSISTED LAPAROSCOPIC RADICAL PROSTATECTOMY N/A 11/06/2014   Procedure: ROBOTIC ASSISTED LAPAROSCOPIC RADICAL PROSTATECTOMY LEVEL 2;  Surgeon: [name], MD;  [facility];  Service: Urology;  Laterality: N/A;  . TOTAL HIP ARTHROPLASTY Left 07/03/2014   Procedure: LEFT TOTAL HIP ARTHROPLASTY ANTERIOR APPROACH;  Surgeon: [name], MD;  [facility];  Service: Orthopedics;  Laterality: Left;  . TOTAL HIP ARTHROPLASTY Right 01/05/2018   Procedure: RIGHT TOTAL HIP ARTHROPLASTY ANTERIOR APPROACH;  Surgeon: Paralee Cancel, MD;  [facility];  Service: Orthopedics;  Laterality: Right;  71 mins  . WISDOM TEETH EXTRACTIONS      Family History  Problem Relation Age of Onset  . Stroke Mother        multiple. 29.   . Heart disease Mother        hx heart valve problem  . Prostate cancer Father        29 of prostate cancer  . Heart attack Neg Hx   . Hypertension Neg Hx     Medications- reviewed and updated Current Outpatient Medications  Medication Sig Dispense Refill  . aspirin EC 81 MG tablet Take 81 mg by mouth daily.    Marland Kitchen atorvastatin (LIPITOR) 20 MG tablet Take 1 tablet by mouth once daily 90 tablet 0  . Darolutamide (NUBEQA) 300 MG TABS  Take by mouth.    Marland Kitchen leuprolide, 6 Month, (ELIGARD) 45 MG injection Inject 45 mg into the skin every 6 (six) months.    . valsartan-hydrochlorothiazide (DIOVAN HCT) 160-12.5 MG tablet Take 1 tablet by mouth daily. 90 tablet 3   No current facility-administered medications for this visit.    Allergies-reviewed and updated No Known Allergies  Social History   Social History Narrative   Married to Dr. [name] 1979. 4 kids (1 lost to auto accident-3 surviving), no grandkids. 1 daughter married in 2016 and works as Forensic psychologist in [place].       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 Oropharynx normal Neck: no thyromegaly CV: RRR no murmurs rubs or gallops Lungs: CTAB no crackles, wheeze, rhonchi Abdomen: soft/nontender/nondistended/normal bowel sounds. No rebound or guarding.  Ext: no edema Skin: warm, dry, has 1 lesions on left low back asked him to have wife take a look at (she is a physician) I am leaning toward derm referral- slight raised darker colored nevus Neuro: grossly normal, moves all extremities, PERRLA    Assessment and Plan  69 y.o. male presenting for annual physical.  Health Maintenance counseling: 1. Anticipatory guidance: Patient counseled regarding regular dental exams q6 months, eye exams yearly,  avoiding smoking and second hand smoke , limiting alcohol to 2 beverages per day .   2. Risk factor reduction:  Advised patient of need for regular exercise and diet rich and fruits and vegetables to reduce risk of heart attack and stroke. Exercise- has not started yet. Diet-trying to cut back on portion sizes. Weight is down but thinks 235 last month was inverted- we will continue to monitor- weight loss is helpful for prediabetes Wt Readings from Last 3 Encounters:  05/01/20 229 lb 12.8 oz (104.2 kg)  04/02/20 234 lb 12.8 oz (106.5 kg)  03/15/20 253 lb (114.8 kg)  3. Immunizations/screenings/ancillary studies- will await recommendations for moderna vaccination. Discussed tdap and shingrix potentially at pharmacy- will discuss with oncology Immunization History  Administered Date(s) Administered  . Influenza Split 06/06/2011, 05/13/2012  . Influenza Whole 05/24/2008, 07/05/2009, 05/21/2010  . Influenza, High Dose Seasonal PF 05/05/2014, 05/11/2015, 05/23/2016, 04/24/2017, 05/07/2018, 03/24/2019, 03/26/2020  . Influenza,inj,Quad PF,6+ Mos  05/13/2013  . Moderna SARS-COVID-2 Vaccination 07/22/2019, 08/22/2019  . Pneumococcal Conjugate-13 05/29/2014  . Pneumococcal Polysaccharide-23 05/15/2017  . Td 03/28/2009  . Zoster 06/10/2013   4. Prostate cancer follow up- close follow up with [facility] planned next week  Lab Results  Component Value Date   PSA 2.83 04/15/2019   PSA 11.57 03/24/2019   PSA 0.20 04/01/2018   5. Colon cancer screening - 12/30/17 with 3 year repeat- does have slight anemia but has been stable- reasonable to monitor as long as stable 6. Skin cancer screening- no dermatology. advised regular sunscreen use. Denies worrisome, changing, or new skin lesions. Wife also is a physician 7. Never smoker 8. STD screening - monogamous so opts out  Status of chronic or acute concerns   #labs from [facility] in last month reviewed including cbc, cmp, psa, testosterone. BUN high at 28- encouraged hydration . Cbc slight anemia  at 12.8 stable over last 3 checks. psa 1.65. testosterone low at 26 on leupron  # gross hematuria last visit- referred back to DR. Dahlstedt last month. Patient reports ended up going to [facility]- they did cystoscopy which was reassuring and they thought it was due to radiation burns. Did not require repeat CT based off most recent CT scan.  - ross hematuria was thought related to prior radiation- every few weeks a drop or so   # prostate cancer- managed by [facility] oncology on nubeqa and eligard  #av block- pacemaker in place- follows with DR. Lovena Le   #hyperlipidemia S: Medication:Atorvastatin 20Mg   Lab Results  Component Value Date   CHOL 155 04/15/2019   HDL 60.30 04/15/2019   LDLCALC 75 04/15/2019   LDLDIRECT 168.8 05/13/2013   TRIG 102.0 04/15/2019   CHOLHDL 3 04/15/2019   A/P: hopefully controlled- ldl last year hair high at 75 and prefer under 70- with how close he has been and with prediabetes will not likely increase dose  # Hyperglycemia/insulin resistance/prediabetes- plan was to get  A1c at [place] if possibel.  S:  Medication: none Exercise and diet- see above Lab Results  Component Value Date   HGBA1C 6.2 04/15/2019   HGBA1C 5.6 10/05/2007   A/P: hopefully controlled with a1c under 6.5- if increasing consider metformin- ideally would avoid - would be interested in metformin potentially  - wants to see how he is doing on weight loss over next 2 months- would be more interested in rybelsus or ozempic if not continuing to improve on weight  #hypertension S: medication: Diovan 160-12.5mg  BP Readings from Last 3 Encounters:  05/01/20 122/70  04/02/20 136/80  03/15/20 136/82  A/P: Stable. Continue current medications.   Recommended follow up: Return in about 1 year (around 05/01/2021) for physical or sooner if needed as long as home blood pressure <140/90. Future Appointments  Date Time Provider Michie  05/07/2020  5:30 PM [facility] TESTING [facility] None  06/12/2020  8:20 AM [facility] DEVICE REMOTES [facility]     Lab/Order associations:Non fasting   ICD-10-CM   1. Preventative health care  Z00.00 Hemoglobin A1c    Lipid panel  2. Essential hypertension  I10   3. Hyperglycemia  R73.9 Hemoglobin A1c  4. Hyperlipidemia, unspecified hyperlipidemia type  E78.5 Lipid panel  5. Prostate cancer ([place])  C61     No orders of the defined types were placed in this encounter.   Return precautions advised.  Garret Reddish, MD

## 2020-05-01 ENCOUNTER — Other Ambulatory Visit: Payer: Self-pay

## 2020-05-01 ENCOUNTER — Encounter: Payer: Self-pay | Admitting: Family Medicine

## 2020-05-01 ENCOUNTER — Ambulatory Visit (INDEPENDENT_AMBULATORY_CARE_PROVIDER_SITE_OTHER): Payer: Medicare PPO | Admitting: Family Medicine

## 2020-05-01 VITALS — BP 122/70 | HR 69 | Temp 97.9°F | Resp 18 | Ht 71.0 in | Wt 229.8 lb

## 2020-05-01 DIAGNOSIS — Z0000 Encounter for general adult medical examination without abnormal findings: Secondary | ICD-10-CM

## 2020-05-01 DIAGNOSIS — R739 Hyperglycemia, unspecified: Secondary | ICD-10-CM

## 2020-05-01 DIAGNOSIS — E785 Hyperlipidemia, unspecified: Secondary | ICD-10-CM

## 2020-05-01 DIAGNOSIS — C61 Malignant neoplasm of prostate: Secondary | ICD-10-CM | POA: Diagnosis not present

## 2020-05-01 DIAGNOSIS — I10 Essential (primary) hypertension: Secondary | ICD-10-CM

## 2020-05-02 LAB — LIPID PANEL
Cholesterol: 173 mg/dL (ref <200)
HDL: 54 mg/dL (ref >=40)
LDL Cholesterol (Calc): 98 mg/dL (calc)
Non-HDL Cholesterol (Calc): 119 mg/dL (calc) (ref <130)
Total CHOL/HDL Ratio: 3.2 (calc) (ref <5.0)
Triglycerides: 115 mg/dL (ref <150)

## 2020-05-02 LAB — HEMOGLOBIN A1C
Hgb A1c MFr Bld: 5.6 % of total Hgb (ref <5.7)
Mean Plasma Glucose: 114 (calc)
eAG (mmol/L): 6.3 (calc)

## 2020-05-07 ENCOUNTER — Other Ambulatory Visit: Payer: Medicare PPO

## 2020-05-07 DIAGNOSIS — R232 Flushing: Secondary | ICD-10-CM | POA: Diagnosis not present

## 2020-05-07 DIAGNOSIS — C7951 Secondary malignant neoplasm of bone: Secondary | ICD-10-CM | POA: Diagnosis not present

## 2020-05-07 DIAGNOSIS — K769 Liver disease, unspecified: Secondary | ICD-10-CM | POA: Diagnosis not present

## 2020-05-07 DIAGNOSIS — R972 Elevated prostate specific antigen [PSA]: Secondary | ICD-10-CM | POA: Diagnosis not present

## 2020-05-07 DIAGNOSIS — I10 Essential (primary) hypertension: Secondary | ICD-10-CM | POA: Diagnosis not present

## 2020-05-07 DIAGNOSIS — C61 Malignant neoplasm of prostate: Secondary | ICD-10-CM | POA: Diagnosis not present

## 2020-05-07 DIAGNOSIS — Z20822 Contact with and (suspected) exposure to covid-19: Secondary | ICD-10-CM

## 2020-05-07 DIAGNOSIS — N6489 Other specified disorders of breast: Secondary | ICD-10-CM | POA: Diagnosis not present

## 2020-05-08 LAB — SARS-COV-2, NAA 2 DAY TAT

## 2020-05-08 LAB — NOVEL CORONAVIRUS, NAA: SARS-CoV-2, NAA: NOT DETECTED

## 2020-05-17 DIAGNOSIS — C7951 Secondary malignant neoplasm of bone: Secondary | ICD-10-CM | POA: Diagnosis not present

## 2020-05-17 DIAGNOSIS — Z79899 Other long term (current) drug therapy: Secondary | ICD-10-CM | POA: Diagnosis not present

## 2020-05-17 DIAGNOSIS — C61 Malignant neoplasm of prostate: Secondary | ICD-10-CM | POA: Diagnosis not present

## 2020-05-17 DIAGNOSIS — Z192 Hormone resistant malignancy status: Secondary | ICD-10-CM | POA: Diagnosis not present

## 2020-05-17 DIAGNOSIS — Z5112 Encounter for antineoplastic immunotherapy: Secondary | ICD-10-CM | POA: Diagnosis not present

## 2020-06-10 ENCOUNTER — Other Ambulatory Visit: Payer: Self-pay | Admitting: Internal Medicine

## 2020-06-12 ENCOUNTER — Ambulatory Visit (INDEPENDENT_AMBULATORY_CARE_PROVIDER_SITE_OTHER): Payer: Medicare PPO

## 2020-06-12 DIAGNOSIS — I442 Atrioventricular block, complete: Secondary | ICD-10-CM

## 2020-06-12 LAB — CUP PACEART REMOTE DEVICE CHECK
Battery Impedance: 1359 Ohm
Battery Remaining Longevity: 44 mo
Battery Voltage: 2.75 V
Brady Statistic AP VP Percent: 8 %
Brady Statistic AP VS Percent: 0 %
Brady Statistic AS VP Percent: 92 %
Brady Statistic AS VS Percent: 0 %
Date Time Interrogation Session: 20211130081940
Implantable Lead Implant Date: 20000731
Implantable Lead Implant Date: 20000731
Implantable Lead Location: 753859
Implantable Lead Location: 753860
Implantable Lead Model: 5092
Implantable Pulse Generator Implant Date: 20121026
Lead Channel Impedance Value: 347 Ohm
Lead Channel Impedance Value: 557 Ohm
Lead Channel Pacing Threshold Amplitude: 1 V
Lead Channel Pacing Threshold Amplitude: 1.375 V
Lead Channel Pacing Threshold Pulse Width: 0.4 ms
Lead Channel Pacing Threshold Pulse Width: 0.4 ms
Lead Channel Setting Pacing Amplitude: 2 V
Lead Channel Setting Pacing Amplitude: 2.75 V
Lead Channel Setting Pacing Pulse Width: 0.4 ms
Lead Channel Setting Sensing Sensitivity: 2.8 mV

## 2020-06-19 NOTE — Progress Notes (Signed)
Remote pacemaker transmission.   

## 2020-07-03 DIAGNOSIS — Z79899 Other long term (current) drug therapy: Secondary | ICD-10-CM | POA: Diagnosis not present

## 2020-07-03 DIAGNOSIS — C61 Malignant neoplasm of prostate: Secondary | ICD-10-CM | POA: Diagnosis not present

## 2020-07-12 DIAGNOSIS — Z79899 Other long term (current) drug therapy: Secondary | ICD-10-CM | POA: Diagnosis not present

## 2020-07-12 DIAGNOSIS — I10 Essential (primary) hypertension: Secondary | ICD-10-CM | POA: Diagnosis not present

## 2020-07-12 DIAGNOSIS — N644 Mastodynia: Secondary | ICD-10-CM | POA: Diagnosis not present

## 2020-07-12 DIAGNOSIS — Z79818 Long term (current) use of other agents affecting estrogen receptors and estrogen levels: Secondary | ICD-10-CM | POA: Diagnosis not present

## 2020-07-12 DIAGNOSIS — R232 Flushing: Secondary | ICD-10-CM | POA: Diagnosis not present

## 2020-07-12 DIAGNOSIS — D630 Anemia in neoplastic disease: Secondary | ICD-10-CM | POA: Diagnosis not present

## 2020-07-12 DIAGNOSIS — C7951 Secondary malignant neoplasm of bone: Secondary | ICD-10-CM | POA: Diagnosis not present

## 2020-07-12 DIAGNOSIS — C61 Malignant neoplasm of prostate: Secondary | ICD-10-CM | POA: Diagnosis not present

## 2020-07-26 DIAGNOSIS — C61 Malignant neoplasm of prostate: Secondary | ICD-10-CM | POA: Diagnosis not present

## 2020-07-26 DIAGNOSIS — Z79899 Other long term (current) drug therapy: Secondary | ICD-10-CM | POA: Diagnosis not present

## 2020-08-09 DIAGNOSIS — C7951 Secondary malignant neoplasm of bone: Secondary | ICD-10-CM | POA: Diagnosis not present

## 2020-08-09 DIAGNOSIS — N644 Mastodynia: Secondary | ICD-10-CM | POA: Diagnosis not present

## 2020-08-09 DIAGNOSIS — I10 Essential (primary) hypertension: Secondary | ICD-10-CM | POA: Diagnosis not present

## 2020-08-09 DIAGNOSIS — R232 Flushing: Secondary | ICD-10-CM | POA: Diagnosis not present

## 2020-08-09 DIAGNOSIS — C61 Malignant neoplasm of prostate: Secondary | ICD-10-CM | POA: Diagnosis not present

## 2020-08-09 DIAGNOSIS — D630 Anemia in neoplastic disease: Secondary | ICD-10-CM | POA: Diagnosis not present

## 2020-08-09 DIAGNOSIS — Z79818 Long term (current) use of other agents affecting estrogen receptors and estrogen levels: Secondary | ICD-10-CM | POA: Diagnosis not present

## 2020-08-09 DIAGNOSIS — Z79899 Other long term (current) drug therapy: Secondary | ICD-10-CM | POA: Diagnosis not present

## 2020-08-23 DIAGNOSIS — C61 Malignant neoplasm of prostate: Secondary | ICD-10-CM | POA: Diagnosis not present

## 2020-08-23 DIAGNOSIS — Z79899 Other long term (current) drug therapy: Secondary | ICD-10-CM | POA: Diagnosis not present

## 2020-08-23 DIAGNOSIS — R972 Elevated prostate specific antigen [PSA]: Secondary | ICD-10-CM | POA: Diagnosis not present

## 2020-08-23 DIAGNOSIS — Z95810 Presence of automatic (implantable) cardiac defibrillator: Secondary | ICD-10-CM | POA: Diagnosis not present

## 2020-08-23 DIAGNOSIS — Z79818 Long term (current) use of other agents affecting estrogen receptors and estrogen levels: Secondary | ICD-10-CM | POA: Diagnosis not present

## 2020-08-23 DIAGNOSIS — C7951 Secondary malignant neoplasm of bone: Secondary | ICD-10-CM | POA: Diagnosis not present

## 2020-09-06 DIAGNOSIS — Z79899 Other long term (current) drug therapy: Secondary | ICD-10-CM | POA: Diagnosis not present

## 2020-09-06 DIAGNOSIS — C61 Malignant neoplasm of prostate: Secondary | ICD-10-CM | POA: Diagnosis not present

## 2020-09-11 ENCOUNTER — Ambulatory Visit (INDEPENDENT_AMBULATORY_CARE_PROVIDER_SITE_OTHER): Payer: Medicare PPO

## 2020-09-11 DIAGNOSIS — I442 Atrioventricular block, complete: Secondary | ICD-10-CM | POA: Diagnosis not present

## 2020-09-11 LAB — CUP PACEART REMOTE DEVICE CHECK
Battery Impedance: 1470 Ohm
Battery Remaining Longevity: 45 mo
Battery Voltage: 2.76 V
Brady Statistic AP VP Percent: 5 %
Brady Statistic AP VS Percent: 0 %
Brady Statistic AS VP Percent: 95 %
Brady Statistic AS VS Percent: 0 %
Date Time Interrogation Session: 20220301082420
Implantable Lead Implant Date: 20000731
Implantable Lead Implant Date: 20000731
Implantable Lead Location: 753859
Implantable Lead Location: 753860
Implantable Lead Model: 5092
Implantable Pulse Generator Implant Date: 20121026
Lead Channel Impedance Value: 348 Ohm
Lead Channel Impedance Value: 563 Ohm
Lead Channel Pacing Threshold Amplitude: 0.875 V
Lead Channel Pacing Threshold Amplitude: 1 V
Lead Channel Pacing Threshold Pulse Width: 0.4 ms
Lead Channel Pacing Threshold Pulse Width: 0.4 ms
Lead Channel Setting Pacing Amplitude: 2 V
Lead Channel Setting Pacing Amplitude: 2.5 V
Lead Channel Setting Pacing Pulse Width: 0.4 ms
Lead Channel Setting Sensing Sensitivity: 2.8 mV

## 2020-09-19 NOTE — Progress Notes (Signed)
Remote pacemaker transmission.   

## 2020-09-20 DIAGNOSIS — Z79899 Other long term (current) drug therapy: Secondary | ICD-10-CM | POA: Diagnosis not present

## 2020-09-20 DIAGNOSIS — C61 Malignant neoplasm of prostate: Secondary | ICD-10-CM | POA: Diagnosis not present

## 2020-09-27 DIAGNOSIS — Z23 Encounter for immunization: Secondary | ICD-10-CM | POA: Diagnosis not present

## 2020-10-04 DIAGNOSIS — C61 Malignant neoplasm of prostate: Secondary | ICD-10-CM | POA: Diagnosis not present

## 2020-10-04 DIAGNOSIS — C7951 Secondary malignant neoplasm of bone: Secondary | ICD-10-CM | POA: Diagnosis not present

## 2020-10-04 DIAGNOSIS — Z79818 Long term (current) use of other agents affecting estrogen receptors and estrogen levels: Secondary | ICD-10-CM | POA: Diagnosis not present

## 2020-10-04 DIAGNOSIS — Z79899 Other long term (current) drug therapy: Secondary | ICD-10-CM | POA: Diagnosis not present

## 2020-10-04 DIAGNOSIS — R232 Flushing: Secondary | ICD-10-CM | POA: Diagnosis not present

## 2020-10-04 DIAGNOSIS — D630 Anemia in neoplastic disease: Secondary | ICD-10-CM | POA: Diagnosis not present

## 2020-10-04 DIAGNOSIS — I10 Essential (primary) hypertension: Secondary | ICD-10-CM | POA: Diagnosis not present

## 2020-10-04 DIAGNOSIS — N644 Mastodynia: Secondary | ICD-10-CM | POA: Diagnosis not present

## 2020-10-18 DIAGNOSIS — Z79899 Other long term (current) drug therapy: Secondary | ICD-10-CM | POA: Diagnosis not present

## 2020-10-18 DIAGNOSIS — C61 Malignant neoplasm of prostate: Secondary | ICD-10-CM | POA: Diagnosis not present

## 2020-11-01 DIAGNOSIS — Z79899 Other long term (current) drug therapy: Secondary | ICD-10-CM | POA: Diagnosis not present

## 2020-11-01 DIAGNOSIS — C7951 Secondary malignant neoplasm of bone: Secondary | ICD-10-CM | POA: Diagnosis not present

## 2020-11-01 DIAGNOSIS — C61 Malignant neoplasm of prostate: Secondary | ICD-10-CM | POA: Diagnosis not present

## 2020-11-11 ENCOUNTER — Encounter: Payer: Self-pay | Admitting: Family Medicine

## 2020-11-20 ENCOUNTER — Ambulatory Visit: Payer: Medicare PPO | Attending: Internal Medicine

## 2020-11-20 DIAGNOSIS — Z20822 Contact with and (suspected) exposure to covid-19: Secondary | ICD-10-CM | POA: Diagnosis not present

## 2020-11-21 LAB — SARS-COV-2, NAA 2 DAY TAT

## 2020-11-21 LAB — NOVEL CORONAVIRUS, NAA: SARS-CoV-2, NAA: NOT DETECTED

## 2020-12-11 ENCOUNTER — Ambulatory Visit (INDEPENDENT_AMBULATORY_CARE_PROVIDER_SITE_OTHER): Payer: Medicare PPO

## 2020-12-11 ENCOUNTER — Other Ambulatory Visit: Payer: Self-pay

## 2020-12-11 ENCOUNTER — Other Ambulatory Visit: Payer: Medicare PPO

## 2020-12-11 ENCOUNTER — Telehealth: Payer: Medicare PPO | Admitting: Family Medicine

## 2020-12-11 ENCOUNTER — Encounter: Payer: Self-pay | Admitting: Family Medicine

## 2020-12-11 DIAGNOSIS — R338 Other retention of urine: Secondary | ICD-10-CM | POA: Diagnosis not present

## 2020-12-11 DIAGNOSIS — C61 Malignant neoplasm of prostate: Secondary | ICD-10-CM | POA: Diagnosis not present

## 2020-12-11 DIAGNOSIS — I442 Atrioventricular block, complete: Secondary | ICD-10-CM

## 2020-12-11 DIAGNOSIS — R310 Gross hematuria: Secondary | ICD-10-CM | POA: Diagnosis not present

## 2020-12-11 NOTE — Progress Notes (Signed)
Canceled visit as patient was able to get a visit with urology scheduled later that afternoon

## 2020-12-12 ENCOUNTER — Encounter (HOSPITAL_COMMUNITY): Payer: Self-pay | Admitting: Emergency Medicine

## 2020-12-12 ENCOUNTER — Other Ambulatory Visit: Payer: Self-pay

## 2020-12-12 ENCOUNTER — Emergency Department (HOSPITAL_COMMUNITY)
Admission: EM | Admit: 2020-12-12 | Discharge: 2020-12-12 | Disposition: A | Discharge status: Home / Self Care | Payer: Medicare PPO | Source: Home / Self Care | Attending: Emergency Medicine | Admitting: Emergency Medicine

## 2020-12-12 ENCOUNTER — Encounter (HOSPITAL_COMMUNITY): Payer: Self-pay

## 2020-12-12 ENCOUNTER — Emergency Department (HOSPITAL_COMMUNITY)
Admission: EM | Admit: 2020-12-12 | Discharge: 2020-12-12 | Disposition: A | Discharge status: Home / Self Care | Payer: Medicare PPO | Attending: Emergency Medicine | Admitting: Emergency Medicine

## 2020-12-12 DIAGNOSIS — Z950 Presence of cardiac pacemaker: Secondary | ICD-10-CM | POA: Insufficient documentation

## 2020-12-12 DIAGNOSIS — I10 Essential (primary) hypertension: Secondary | ICD-10-CM | POA: Diagnosis not present

## 2020-12-12 DIAGNOSIS — T83098A Other mechanical complication of other indwelling urethral catheter, initial encounter: Secondary | ICD-10-CM | POA: Insufficient documentation

## 2020-12-12 DIAGNOSIS — Z7982 Long term (current) use of aspirin: Secondary | ICD-10-CM | POA: Diagnosis not present

## 2020-12-12 DIAGNOSIS — Z79899 Other long term (current) drug therapy: Secondary | ICD-10-CM | POA: Diagnosis not present

## 2020-12-12 DIAGNOSIS — R310 Gross hematuria: Secondary | ICD-10-CM

## 2020-12-12 DIAGNOSIS — Z8546 Personal history of malignant neoplasm of prostate: Secondary | ICD-10-CM | POA: Diagnosis not present

## 2020-12-12 DIAGNOSIS — T83091A Other mechanical complication of indwelling urethral catheter, initial encounter: Secondary | ICD-10-CM

## 2020-12-12 DIAGNOSIS — Z96643 Presence of artificial hip joint, bilateral: Secondary | ICD-10-CM | POA: Insufficient documentation

## 2020-12-12 DIAGNOSIS — Z960 Presence of urogenital implants: Secondary | ICD-10-CM | POA: Insufficient documentation

## 2020-12-12 DIAGNOSIS — Z466 Encounter for fitting and adjustment of urinary device: Secondary | ICD-10-CM | POA: Insufficient documentation

## 2020-12-12 DIAGNOSIS — R1030 Lower abdominal pain, unspecified: Secondary | ICD-10-CM | POA: Insufficient documentation

## 2020-12-12 DIAGNOSIS — R338 Other retention of urine: Secondary | ICD-10-CM | POA: Diagnosis not present

## 2020-12-12 DIAGNOSIS — R339 Retention of urine, unspecified: Secondary | ICD-10-CM | POA: Diagnosis present

## 2020-12-12 LAB — CUP PACEART REMOTE DEVICE CHECK
Battery Impedance: 1415 Ohm
Battery Remaining Longevity: 45 mo
Battery Voltage: 2.75 V
Brady Statistic AP VP Percent: 6 %
Brady Statistic AP VS Percent: 0 %
Brady Statistic AS VP Percent: 94 %
Brady Statistic AS VS Percent: 0 %
Date Time Interrogation Session: 20220601095602
Implantable Lead Implant Date: 20000731
Implantable Lead Implant Date: 20000731
Implantable Lead Location: 753859
Implantable Lead Location: 753860
Implantable Lead Model: 5092
Implantable Pulse Generator Implant Date: 20121026
Lead Channel Impedance Value: 327 Ohm
Lead Channel Impedance Value: 501 Ohm
Lead Channel Pacing Threshold Amplitude: 0.875 V
Lead Channel Pacing Threshold Amplitude: 1 V
Lead Channel Pacing Threshold Pulse Width: 0.4 ms
Lead Channel Pacing Threshold Pulse Width: 0.4 ms
Lead Channel Setting Pacing Amplitude: 2 V
Lead Channel Setting Pacing Amplitude: 2.5 V
Lead Channel Setting Pacing Pulse Width: 0.4 ms
Lead Channel Setting Sensing Sensitivity: 2.8 mV

## 2020-12-12 NOTE — ED Notes (Signed)
ED provider at the bedside to re-evaluate pt. Per ED provider, holding bladder irrigation at this time. Urology has been consulted by ED provider and they are to see pt in ED. No further orders at this time. Will continue to monitor.

## 2020-12-12 NOTE — ED Provider Notes (Signed)
[facility] DEPT Provider Note   CSN: [id number] Arrival date & time: 12/12/20  1218     History Chief Complaint  Patient presents with  . Urinary Retention    Devin Mercado is a 70 y.o. male.  HPI      Devin Mercado is a 70 y.o. male, with a history of prostate cancer, hyperlipidemia, HTN, presenting to the ED with concern for nondraining Foley catheter.  In short, patient was seen in the ED earlier today for nondraining catheter.  Urologist saw the patient in the ED, his Foley was replaced with a 24 [place] and then irrigated.  Patient states he arrived home, the Foley continues to drain and he was able to fill 1 leg bag, however, it then stopped draining. When he was about to get out of the car to come into the ED this time he noted the Foley was again draining and his leg bag was filled.  Denies fever, abdominal discomfort, flank/back pain, chest pain, dizziness, shortness of breath, or any other complaints.   Past Medical History:  Diagnosis Date  . Arthritis    OA AND PAIN LEFT HIP  . CHB (complete heart block) ([place])    a. MDT dual chamber pacemaker  . HTN (hypertension)   . Hyperlipidemia   . Presence of permanent cardiac pacemaker    2000  . Prostate cancer ([place]) 09/2014    Patient Active Problem List   Diagnosis Date Noted  . History of adenomatous polyp of colon 04/15/2019  . Palpitations 03/15/2019  . S/P right THA, AA 01/05/2018  . Positive colorectal cancer screening using Cologuard test   . Hyperglycemia 06/12/2015  . Prostate cancer ([place]) 09/12/2014  . Obese 07/04/2014  . S/P left THA, AA 07/03/2014  . PPM-Medtronic 05/23/2010  . Hyperlipidemia 03/28/2009  . Atrioventricular block, complete ([place]) 03/28/2009  . Essential hypertension 03/16/2008    Past Surgical History:  Procedure Laterality Date  . ADENOIDECTOMY AS A CHILD    . COLONOSCOPY WITH PROPOFOL N/A 12/30/2017   Procedure: COLONOSCOPY WITH PROPOFOL;   Surgeon: Ladene Artist, MD;  [facility];  Service: Endoscopy;  Laterality: N/A;  . JOINT REPLACEMENT     Right hip replacement Dr. Alvan Dame 01-05-18  . LYMPHADENECTOMY Bilateral 11/06/2014   Procedure: PELVIC LYMPHADENECTOMY;  Surgeon: [name], MD;  [facility];  Service: Urology;  Laterality: Bilateral;  . PACEMAKER INSERTION  2000; 2012   MDT dual chamber pacemaker implanted 2000 with gen change 2012  . POLYPECTOMY  12/30/2017   Procedure: POLYPECTOMY;  Surgeon: Ladene Artist, MD;  [facility];  Service: Endoscopy;;  . PROSTATE BIOPSY  09/05/14  . ROBOT ASSISTED LAPAROSCOPIC RADICAL PROSTATECTOMY N/A 11/06/2014   Procedure: ROBOTIC ASSISTED LAPAROSCOPIC RADICAL PROSTATECTOMY LEVEL 2;  Surgeon: [name], MD;  [facility];  Service: Urology;  Laterality: N/A;  . TOTAL HIP ARTHROPLASTY Left 07/03/2014   Procedure: LEFT TOTAL HIP ARTHROPLASTY ANTERIOR APPROACH;  Surgeon: [name], MD;  [facility];  Service: Orthopedics;  Laterality: Left;  . TOTAL HIP ARTHROPLASTY Right 01/05/2018   Procedure: RIGHT TOTAL HIP ARTHROPLASTY ANTERIOR APPROACH;  Surgeon: Paralee Cancel, MD;  [facility];  Service: Orthopedics;  Laterality: Right;  70 mins  . WISDOM TEETH EXTRACTIONS         Family History  Problem Relation Age of Onset  . Stroke Mother        multiple. 77.   . Heart disease Mother  hx heart valve problem  . Prostate cancer Father        41 of prostate cancer  . Heart attack Neg Hx   . Hypertension Neg Hx     Social History   Tobacco Use  . Smoking status: Never Smoker  . Smokeless tobacco: Never Used  Vaping Use  . Vaping Use: Never used  Substance Use Topics  . Alcohol use: Yes    Alcohol/week: 2.0 - 3.0 standard drinks    Types: 2 - 3 Standard drinks or equivalent per week    Comment: 5 TO 7 DRINKS A WEEK stopped etoh use pending surgery   . Drug use: No    Home Medications Prior to Admission medications    Medication Sig Start Date End Date Taking? Authorizing Provider  aspirin EC 81 MG tablet Take 81 mg by mouth daily.    [provider]  atorvastatin (LIPITOR) 20 MG tablet Take 1 tablet by mouth once daily 06/12/20   [name], MD  Darolutamide (NUBEQA) 300 MG TABS Take by mouth.    [provider]  leuprolide, 6 Month, (ELIGARD) 45 MG injection Inject 45 mg into the skin every 6 (six) months.    [provider]  valsartan-hydrochlorothiazide (DIOVAN HCT) 160-12.5 MG tablet Take 1 tablet by mouth daily. 03/15/20   [name], MD    Allergies    Patient has no known allergies.  Review of Systems   Review of Systems  Constitutional: Negative for chills and fever.  Respiratory: Negative for shortness of breath.   Cardiovascular: Negative for chest pain.  Gastrointestinal: Negative for abdominal pain, diarrhea, nausea and vomiting.  Neurological: Negative for dizziness, syncope and weakness.  All other systems reviewed and are negative.   Physical Exam Updated Vital Signs BP (!) 143/97 (BP Location: Left Arm)   Pulse (!) 120   Temp 98 F (36.7 C) (Oral)   Resp 18   SpO2 98%   Physical Exam Vitals and nursing note reviewed.  Constitutional:      General: He is not in acute distress.    Appearance: He is well-developed. He is not diaphoretic.  HENT:     Head: Normocephalic and atraumatic.     Mouth/Throat:     Mouth: Mucous membranes are moist.     Pharynx: Oropharynx is clear.  Eyes:     Conjunctiva/sclera: Conjunctivae normal.  Cardiovascular:     Rate and Rhythm: Normal rate and regular rhythm.     Pulses: Normal pulses.  Pulmonary:     Effort: Pulmonary effort is normal. No respiratory distress.  Abdominal:     Palpations: Abdomen is soft.     Tenderness: There is no abdominal tenderness. There is no guarding.  Genitourinary:    Comments: Patient has 74 French Foley in place.  Bloody urine drainage in the collection  bag. Musculoskeletal:     Cervical back: Neck supple.  Skin:    General: Skin is warm and dry.  Neurological:     Mental Status: He is alert.  Psychiatric:        Mood and Affect: Mood and affect normal.        Speech: Speech normal.        Behavior: Behavior normal.     ED Results / Procedures / Treatments   Labs (all labs ordered are listed, but only abnormal results are displayed) Labs Reviewed - No data to display  EKG None  Radiology [facility] [place]  Result Date: 12/12/2020 Scheduled remote reviewed. Normal device function.  1 AMS event <1 minute; no EGM 1 VHR event lasting 6 beats w/ rate 150's bpm; markers suggest SVT Next remote 91 days. HB   Procedures Procedures   Medications Ordered in ED Medications - No data to display  ED Course  I have reviewed the triage vital signs and the nursing notes.  Pertinent labs & imaging results that were available during my care of the patient were reviewed by me and considered in my medical decision making (see chart for details).  Clinical Course as of 12/12/20 1747  Wed Dec 12, 2020  1357 Spoke with Dr. Matilde Sprang, urology.  Recommends irrigating the bladder and then calling him back with the result. [SJ]  64 Spoke with Dr. Matilde Sprang to update him on patient's now draining Foley following the irrigation.  He states if the Foley is draining and the patient wants to go home, then we may discharge him.  If the patient is willing we can instruct him on how to flush his own Foley. They will plan on checking on the patient in the morning. [SJ]    Clinical Course User Index [SJ] Davi Kroon, Helane Gunther, PA-C   MDM Rules/Calculators/A&P                          Patient presents with concern for nondraining Foley catheter. Patient is nontoxic appearing, afebrile, not tachycardic on my exam, not tachypneic, not hypotensive, and is in no apparent distress.   I have reviewed the patient's chart to obtain more  information.   Initial bladder scan showed 116 mL.  RN flush the patient's Foley and bladder.  Large clot was released and Foley was again freely draining. Patient states he has a full day of oncology appointments at [facility] starting tomorrow morning and would prefer discharge from the ED today in order to go to those appointments tomorrow. He is willing to learn how to flush his Foley on his own at home.  This instruction was provided by RN staff and supplies were given to the patient.  Findings and plan of care discussed with attending physician, Malvin Johns, MD. Dr. Tamera Punt personally evaluated and examined this patient.   Vitals:   12/12/20 1228 12/12/20 1617  BP: (!) 143/97 131/70  Pulse: (!) 120 78  Resp: 18 16  Temp: 98 F (36.7 C)   TempSrc: Oral   SpO2: 98% 100%     Final Clinical Impression(s) / ED Diagnoses Final diagnoses:  None    Rx / DC Orders ED Discharge Orders    None       Layla Maw 12/12/20 1836    Malvin Johns, MD 12/13/20 1521

## 2020-12-12 NOTE — ED Provider Notes (Signed)
Emergency Medicine Provider Triage Evaluation Note  Devin Mercado , a 70 y.o. male  was evaluated in triage.  Pt complains of blocked foley catheter. Reports it stopped draining around 0100.  Had catheter placed at [facility] at 3 PM yesterday due to acute urinary retention and blood clots.  He is not on chronic anticoagulation; takes a daily aspirin.  He is undergoing prostate cancer therapy at [facility].  Initially did not have any pain with his symptoms, but has had gradually worsening pressure in his suprapubic abdomen.  Review of Systems  Positive: hematuria Negative: fevers  Physical Exam  BP (!) 159/87 (BP Location: Left Arm)   Pulse 81   Temp 98.4 F (36.9 C) (Oral)   Resp 16   Ht 5\' 11"  (1.803 m)   Wt 105.2 kg   SpO2 99%   BMI 32.36 kg/m  Gen:   Awake, no distress   Resp:  Normal effort  MSK:   Moves extremities without difficulty  Other:  Clots noted in foley catheter; nondraining at this time  Medical Decision Making  Medically screening exam initiated at 6:24 AM.  Appropriate orders placed.  Devin Mercado was informed that the remainder of the evaluation will be completed by another provider, this initial triage assessment does not replace that evaluation, and the importance of remaining in the ED until their evaluation is complete.  Foley catheter blockage   Antonietta Breach, PA-C 12/12/20 [id number]    Veryl Speak, MD 12/13/20 [phone]

## 2020-12-12 NOTE — ED Notes (Signed)
ED PA-C at the bedside to evaluate.

## 2020-12-12 NOTE — Discharge Instructions (Addendum)
Flush the Foley catheter, as needed. Follow-up with the urologist for any further management of this issue.

## 2020-12-12 NOTE — ED Triage Notes (Signed)
Pt had a catheter placed at [facility] at 3pm on 12/11/20. Pt was told that if the catheter stopped draining he should come to the ED. Pt states that his catheter stopped draining about an hour ago. Pt states that his bladder feels full but that he is not in pain. Pt had acute urinary retention and blood clots, which is why he needed the catheter placed. Pt is undergoing prostate cancer therapy at [facility]. Pt's physician at [facility] is Dr. Adriana Mccallum.

## 2020-12-12 NOTE — ED Notes (Signed)
Bladder scan obtained by ED PA-C reveals 198mL's of urine in the bladder.

## 2020-12-12 NOTE — ED Provider Notes (Signed)
[facility] DEPT Provider Note   CSN: [id number] Arrival date & time: 12/12/20  0203     History Chief Complaint  Patient presents with  . urinary catheter problem    Devin Mercado is a 70 y.o. male.  Patient is a 70 year old male who presents with urinary retention.  He has a history of prior radiation to his prostate.  He noted some blood clots in his urine yesterday and went to [facility] urology where a catheter was placed.  He said he was doing okay until 1 AM this morning when it got blocked.  He had increased pain to his lower abdomen.  No nausea or vomiting.  No fevers.  No other recent illnesses.  He has already had some bladder irrigation prior to my evaluation and says he feels better.        Past Medical History:  Diagnosis Date  . Arthritis    OA AND PAIN LEFT HIP  . CHB (complete heart block) ([place])    a. MDT dual chamber pacemaker  . HTN (hypertension)   . Hyperlipidemia   . Presence of permanent cardiac pacemaker    2000  . Prostate cancer ([place]) 09/2014    Patient Active Problem List   Diagnosis Date Noted  . History of adenomatous polyp of colon 04/15/2019  . Palpitations 03/15/2019  . S/P right THA, AA 01/05/2018  . Positive colorectal cancer screening using Cologuard test   . Hyperglycemia 06/12/2015  . Prostate cancer ([place]) 09/12/2014  . Obese 07/04/2014  . S/P left THA, AA 07/03/2014  . PPM-Medtronic 05/23/2010  . Hyperlipidemia 03/28/2009  . Atrioventricular block, complete ([place]) 03/28/2009  . Essential hypertension 03/16/2008    Past Surgical History:  Procedure Laterality Date  . ADENOIDECTOMY AS A CHILD    . COLONOSCOPY WITH PROPOFOL N/A 12/30/2017   Procedure: COLONOSCOPY WITH PROPOFOL;  Surgeon: Ladene Artist, MD;  [facility];  Service: Endoscopy;  Laterality: N/A;  . JOINT REPLACEMENT     Right hip replacement Dr. Alvan Dame 01-05-18  . LYMPHADENECTOMY Bilateral 11/06/2014   Procedure: PELVIC  LYMPHADENECTOMY;  Surgeon: [name], MD;  [facility];  Service: Urology;  Laterality: Bilateral;  . PACEMAKER INSERTION  2000; 2012   MDT dual chamber pacemaker implanted 2000 with gen change 2012  . POLYPECTOMY  12/30/2017   Procedure: POLYPECTOMY;  Surgeon: Ladene Artist, MD;  [facility];  Service: Endoscopy;;  . PROSTATE BIOPSY  09/05/14  . ROBOT ASSISTED LAPAROSCOPIC RADICAL PROSTATECTOMY N/A 11/06/2014   Procedure: ROBOTIC ASSISTED LAPAROSCOPIC RADICAL PROSTATECTOMY LEVEL 2;  Surgeon: [name], MD;  [facility];  Service: Urology;  Laterality: N/A;  . TOTAL HIP ARTHROPLASTY Left 07/03/2014   Procedure: LEFT TOTAL HIP ARTHROPLASTY ANTERIOR APPROACH;  Surgeon: [name], MD;  [facility];  Service: Orthopedics;  Laterality: Left;  . TOTAL HIP ARTHROPLASTY Right 01/05/2018   Procedure: RIGHT TOTAL HIP ARTHROPLASTY ANTERIOR APPROACH;  Surgeon: Paralee Cancel, MD;  [facility];  Service: Orthopedics;  Laterality: Right;  70 mins  . WISDOM TEETH EXTRACTIONS         Family History  Problem Relation Age of Onset  . Stroke Mother        multiple. 46.   . Heart disease Mother        hx heart valve problem  . Prostate cancer Father        11 of prostate cancer  . Heart attack Neg Hx   .  Hypertension Neg Hx     Social History   Tobacco Use  . Smoking status: Never Smoker  . Smokeless tobacco: Never Used  Vaping Use  . Vaping Use: Never used  Substance Use Topics  . Alcohol use: Yes    Alcohol/week: 2.0 - 3.0 standard drinks    Types: 2 - 3 Standard drinks or equivalent per week    Comment: 5 TO 7 DRINKS A WEEK stopped etoh use pending surgery   . Drug use: No    Home Medications Prior to Admission medications   Medication Sig Start Date End Date Taking? Authorizing Provider  aspirin EC 81 MG tablet Take 81 mg by mouth daily.    [provider]  atorvastatin (LIPITOR) 20 MG tablet Take 1 tablet by mouth once daily 06/12/20    [name], MD  Darolutamide (NUBEQA) 300 MG TABS Take by mouth.    [provider]  leuprolide, 6 Month, (ELIGARD) 45 MG injection Inject 45 mg into the skin every 6 (six) months.    [provider]  valsartan-hydrochlorothiazide (DIOVAN HCT) 160-12.5 MG tablet Take 1 tablet by mouth daily. 03/15/20   [name], MD    Allergies    Patient has no known allergies.  Review of Systems   Review of Systems  Constitutional: Negative for chills, fatigue and fever.  HENT: Negative for congestion, rhinorrhea and sneezing.   Eyes: Negative.   Respiratory: Negative for cough and shortness of breath.   Cardiovascular: Negative for chest pain and leg swelling.  Gastrointestinal: Negative for abdominal pain, blood in stool, diarrhea, nausea and vomiting.  Genitourinary: Positive for difficulty urinating and hematuria. Negative for flank pain and frequency.  Musculoskeletal: Negative for back pain.  Skin: Negative for rash.  Neurological: Negative for dizziness and headaches.    Physical Exam Updated Vital Signs BP 133/75 (BP Location: Right Arm)   Pulse 72   Temp 98.6 F (37 C) (Oral)   Resp 18   Ht 5\' 11"  (1.803 m)   Wt 105.2 kg   SpO2 100%   BMI 32.36 kg/m   Physical Exam Constitutional:      Appearance: He is well-developed.  HENT:     Head: Normocephalic and atraumatic.  Eyes:     Pupils: Pupils are equal, round, and reactive to light.  Cardiovascular:     Rate and Rhythm: Normal rate and regular rhythm.     Heart sounds: Normal heart sounds.  Pulmonary:     Effort: Pulmonary effort is normal. No respiratory distress.     Breath sounds: Normal breath sounds. No wheezing or rales.  Chest:     Chest wall: No tenderness.  Abdominal:     General: Bowel sounds are normal.     Palpations: Abdomen is soft.     Tenderness: There is no abdominal tenderness. There is no guarding or rebound.  Genitourinary:    Comments: 22FR catheter in place,  blood-tinged urine with clots Musculoskeletal:        General: Normal range of motion.     Cervical back: Normal range of motion and neck supple.  Lymphadenopathy:     Cervical: No cervical adenopathy.  Skin:    General: Skin is warm and dry.     Findings: No rash.  Neurological:     Mental Status: He is alert and oriented to person, place, and time.     ED Results / Procedures / Treatments   Labs (all labs ordered are listed,  but only abnormal results are displayed) Labs Reviewed  URINALYSIS, ROUTINE W REFLEX MICROSCOPIC    EKG None  Radiology No results found.  Procedures Procedures   Medications Ordered in ED Medications - No data to display  ED Course  I have reviewed the triage vital signs and the nursing notes.  Pertinent labs & imaging results that were available during my care of the patient were reviewed by me and considered in my medical decision making (see chart for details).    MDM Rules/Calculators/A&P                          Patient presents with gross hematuria.  He was still having clots and hematuria following irrigation.  I spoke with [facility]arm[facility] who has [facility] the patient.  He did some further irrigation and the patient in discussion with him has opted to go home.  They will follow him up in the office tomorrow.  He has antibiotics at home that he will start taking.  Return precautions were given. Final Clinical Impression(s) / ED Diagnoses Final diagnoses:  Gross hematuria    Rx / DC Orders ED Discharge Orders    None       Malvin Johns, MD 12/12/20 [phone]

## 2020-12-12 NOTE — ED Triage Notes (Signed)
Pt arrived via POV, urinary retention, foley in place. Had issues with drainage this morning, was seen, foley irrigated of clots and sent home. X1 hr after return home pt foley stopped draining again.

## 2020-12-12 NOTE — ED Notes (Signed)
While updating v/s, pt reports being in a significant amount of pain. Advised that I would let the triage nurse know. Triage RN notified.

## 2020-12-12 NOTE — Consult Note (Signed)
Urology Consult  Referring physician: Ermalinda Memos Reason for referral: Hematuria  Chief Complaint: Hematuria  History of Present Illness: Patient known to practice [facility] Prostate and mets and recurrent hematuria from radiation; irrigated yesterday in clinic; was travelling and had decreased flow; 22 r foley inserted and irrigated for one liter; came back today with foley not draining well and irrigated again; not on blood thinners; on ASA  Has [facility] appointment Friday     Past Medical History:  Diagnosis Date  . Arthritis    OA AND PAIN LEFT HIP  . CHB (complete heart block) ([place])    a. MDT dual chamber pacemaker  . HTN (hypertension)   . Hyperlipidemia   . Presence of permanent cardiac pacemaker    2000  . Prostate cancer ([place]) 09/2014   Past Surgical History:  Procedure Laterality Date  . ADENOIDECTOMY AS A CHILD    . COLONOSCOPY WITH PROPOFOL N/A 12/30/2017   Procedure: COLONOSCOPY WITH PROPOFOL;  Surgeon: Ladene Artist, MD;  [facility];  Service: Endoscopy;  Laterality: N/A;  . JOINT REPLACEMENT     Right hip replacement Dr. Alvan Dame 01-05-18  . LYMPHADENECTOMY Bilateral 11/06/2014   Procedure: PELVIC LYMPHADENECTOMY;  Surgeon: [name], MD;  [facility];  Service: Urology;  Laterality: Bilateral;  . PACEMAKER INSERTION  2000; 2012   MDT dual chamber pacemaker implanted 2000 with gen change 2012  . POLYPECTOMY  12/30/2017   Procedure: POLYPECTOMY;  Surgeon: Ladene Artist, MD;  [facility];  Service: Endoscopy;;  . PROSTATE BIOPSY  09/05/14  . ROBOT ASSISTED LAPAROSCOPIC RADICAL PROSTATECTOMY N/A 11/06/2014   Procedure: ROBOTIC ASSISTED LAPAROSCOPIC RADICAL PROSTATECTOMY LEVEL 2;  Surgeon: [name], MD;  [facility];  Service: Urology;  Laterality: N/A;  . TOTAL HIP ARTHROPLASTY Left 07/03/2014   Procedure: LEFT TOTAL HIP ARTHROPLASTY ANTERIOR APPROACH;  Surgeon: [name], MD;  [facility];  Service: Orthopedics;  Laterality: Left;   . TOTAL HIP ARTHROPLASTY Right 01/05/2018   Procedure: RIGHT TOTAL HIP ARTHROPLASTY ANTERIOR APPROACH;  Surgeon: Paralee Cancel, MD;  [facility];  Service: Orthopedics;  Laterality: Right;  70 mins  . WISDOM TEETH EXTRACTIONS      Medications: I have reviewed the patient's current medications. Allergies: No Known Allergies  Family History  Problem Relation Age of Onset  . Stroke Mother        multiple. 55.   . Heart disease Mother        hx heart valve problem  . Prostate cancer Father        61 of prostate cancer  . Heart attack Neg Hx   . Hypertension Neg Hx    Social History:  reports that he has never smoked. He has never used smokeless tobacco. He reports current alcohol use of about 2.0 - 3.0 standard drinks of alcohol per week. He reports that he does not use drugs.  ROS: All systems are reviewed and negative except as noted. Rest negative  Physical Exam:  Vital signs in last 24 hours: Temp:  [98.4 F (36.9 C)-98.6 F (37 C)] 98.6 F (37 C) (06/01 0746) Pulse Rate:  [72-81] 72 (06/01 0746) Resp:  [16-18] 18 (06/01 0746) BP: (133-159)/(75-93) 133/75 (06/01 0746) SpO2:  [95 %-100 %] 100 % (06/01 0746) Weight:  [105.2 kg] 105.2 kg (06/01 0215)  Cardiovascular: Skin warm; not flushed Respiratory: Breaths quiet; no shortness of breath Abdomen: No masses Neurological: Normal sensation to touch Musculoskeletal: Normal motor function arms and  legs Lymphatics: No inguinal adenopathy Skin: No rashes Genitourinary:foley not draining or irrigating well; no distress and not distended  Laboratory Data:  No results found for this or any previous visit (from the past 72 hour(s)). No results found for this or any previous visit (from the past 240 hour(s)). Creatinine: No results for input(s): CREATININE in the last 168 hours.  Xrays: See report/chart none  Impression/Assessment:  Clot retention  Plan:  Change to 24 Fr two way irrigated modest clots to light  red Push fluids Stop ASA Call tomorrow and see Friday Patient and I decided not to admit for observation but discussed Take keflex from yesterday  Taimi Towe A Finlay Godbee 12/12/2020, 8:07 AM

## 2020-12-12 NOTE — Discharge Instructions (Addendum)
Follow-up with [facility] as discussed.  Return here as needed if you have any worsening symptoms or your Foley catheter stops draining.

## 2020-12-12 NOTE — ED Notes (Signed)
Bladder scanner resulted 5mL

## 2020-12-12 NOTE — ED Provider Notes (Signed)
Emergency Medicine Provider Triage Evaluation Note  Devin Mercado , a 70 y.o. male  was evaluated in triage.  Pt complains of urinary retention since going home this morning from the ER 1 hour after he got home.  He was seen this morning for a clogged Foley catheter.  He is concerned this is happening again.  Denies any other complaint.  Review of Systems  Positive: Urinary retention Negative: Abdominal pain  Physical Exam  BP (!) 143/97 (BP Location: Left Arm)   Pulse (!) 120   Temp 98 F (36.7 C) (Oral)   Resp 18   SpO2 98%  Gen:   Awake, no distress   Resp:  Normal effort  MSK:   Moves extremities without difficulty  Other:  Foley catheter in place  Medical Decision Making  Medically screening exam initiated at 12:31 PM.  Appropriate orders placed.  Devin Mercado was informed that the remainder of the evaluation will be completed by another provider, this initial triage assessment does not replace that evaluation, and the importance of remaining in the ED until their evaluation is complete.  Will likely require irrigation again   Delia Heady, PA-C 12/12/20 1232    Malvin Johns, MD 12/12/20 1314

## 2020-12-12 NOTE — ED Notes (Signed)
Pt educated on irrigation of foley catheter, per PA-C's verbal order.

## 2020-12-12 NOTE — ED Notes (Signed)
Dr. Arther Dames, Urology, at bedside for bladder irrigation.

## 2020-12-13 DIAGNOSIS — C61 Malignant neoplasm of prostate: Secondary | ICD-10-CM | POA: Diagnosis not present

## 2020-12-13 DIAGNOSIS — I10 Essential (primary) hypertension: Secondary | ICD-10-CM | POA: Diagnosis not present

## 2020-12-13 DIAGNOSIS — Z79899 Other long term (current) drug therapy: Secondary | ICD-10-CM | POA: Diagnosis not present

## 2020-12-13 DIAGNOSIS — N390 Urinary tract infection, site not specified: Secondary | ICD-10-CM | POA: Diagnosis not present

## 2020-12-13 DIAGNOSIS — C7951 Secondary malignant neoplasm of bone: Secondary | ICD-10-CM | POA: Diagnosis not present

## 2020-12-13 DIAGNOSIS — Z79818 Long term (current) use of other agents affecting estrogen receptors and estrogen levels: Secondary | ICD-10-CM | POA: Diagnosis not present

## 2020-12-13 DIAGNOSIS — D630 Anemia in neoplastic disease: Secondary | ICD-10-CM | POA: Diagnosis not present

## 2020-12-13 DIAGNOSIS — R972 Elevated prostate specific antigen [PSA]: Secondary | ICD-10-CM | POA: Diagnosis not present

## 2020-12-14 ENCOUNTER — Other Ambulatory Visit: Payer: Self-pay

## 2020-12-14 ENCOUNTER — Encounter (HOSPITAL_COMMUNITY): Payer: Self-pay

## 2020-12-14 ENCOUNTER — Observation Stay (HOSPITAL_COMMUNITY)
Admission: EM | Admit: 2020-12-14 | Discharge: 2020-12-16 | Disposition: A | Discharge status: Home / Self Care | Payer: Medicare PPO | Attending: Emergency Medicine | Admitting: Emergency Medicine

## 2020-12-14 DIAGNOSIS — E785 Hyperlipidemia, unspecified: Secondary | ICD-10-CM | POA: Insufficient documentation

## 2020-12-14 DIAGNOSIS — Z7982 Long term (current) use of aspirin: Secondary | ICD-10-CM | POA: Insufficient documentation

## 2020-12-14 DIAGNOSIS — Z8249 Family history of ischemic heart disease and other diseases of the circulatory system: Secondary | ICD-10-CM | POA: Insufficient documentation

## 2020-12-14 DIAGNOSIS — I442 Atrioventricular block, complete: Secondary | ICD-10-CM | POA: Diagnosis not present

## 2020-12-14 DIAGNOSIS — Z96643 Presence of artificial hip joint, bilateral: Secondary | ICD-10-CM | POA: Diagnosis not present

## 2020-12-14 DIAGNOSIS — Z950 Presence of cardiac pacemaker: Secondary | ICD-10-CM | POA: Insufficient documentation

## 2020-12-14 DIAGNOSIS — I10 Essential (primary) hypertension: Secondary | ICD-10-CM | POA: Insufficient documentation

## 2020-12-14 DIAGNOSIS — Z8042 Family history of malignant neoplasm of prostate: Secondary | ICD-10-CM | POA: Diagnosis not present

## 2020-12-14 DIAGNOSIS — Z20822 Contact with and (suspected) exposure to covid-19: Secondary | ICD-10-CM | POA: Diagnosis not present

## 2020-12-14 DIAGNOSIS — R310 Gross hematuria: Secondary | ICD-10-CM | POA: Diagnosis not present

## 2020-12-14 DIAGNOSIS — R339 Retention of urine, unspecified: Secondary | ICD-10-CM | POA: Diagnosis present

## 2020-12-14 DIAGNOSIS — Z9079 Acquired absence of other genital organ(s): Secondary | ICD-10-CM | POA: Insufficient documentation

## 2020-12-14 DIAGNOSIS — C61 Malignant neoplasm of prostate: Secondary | ICD-10-CM | POA: Diagnosis not present

## 2020-12-14 DIAGNOSIS — E669 Obesity, unspecified: Secondary | ICD-10-CM | POA: Diagnosis not present

## 2020-12-14 DIAGNOSIS — R338 Other retention of urine: Secondary | ICD-10-CM | POA: Diagnosis not present

## 2020-12-14 DIAGNOSIS — Z79899 Other long term (current) drug therapy: Secondary | ICD-10-CM | POA: Insufficient documentation

## 2020-12-14 LAB — CBC WITH DIFFERENTIAL/PLATELET
Abs Immature Granulocytes: 0.26 10*3/uL — ABNORMAL HIGH (ref 0.00–0.07)
Basophils Absolute: 0 10*3/uL (ref 0.0–0.1)
Basophils Relative: 0 %
Eosinophils Absolute: 0.1 10*3/uL (ref 0.0–0.5)
Eosinophils Relative: 1 %
HCT: 22.4 % — ABNORMAL LOW (ref 39.0–52.0)
Hemoglobin: 7.5 g/dL — ABNORMAL LOW (ref 13.0–17.0)
Immature Granulocytes: 2 %
Lymphocytes Relative: 5 %
Lymphs Abs: 0.6 10*3/uL — ABNORMAL LOW (ref 0.7–4.0)
MCH: 30.4 pg (ref 26.0–34.0)
MCHC: 33.5 g/dL (ref 30.0–36.0)
MCV: 90.7 fL (ref 80.0–100.0)
Monocytes Absolute: 0.8 10*3/uL (ref 0.1–1.0)
Monocytes Relative: 7 %
Neutro Abs: 10.8 10*3/uL — ABNORMAL HIGH (ref 1.7–7.7)
Neutrophils Relative %: 85 %
Platelets: 207 10*3/uL (ref 150–400)
RBC: 2.47 MIL/uL — ABNORMAL LOW (ref 4.22–5.81)
RDW: 13.3 % (ref 11.5–15.5)
WBC: 12.7 10*3/uL — ABNORMAL HIGH (ref 4.0–10.5)
nRBC: 0 % (ref 0.0–0.2)

## 2020-12-14 LAB — BASIC METABOLIC PANEL
Anion gap: 8 (ref 5–15)
BUN: 31 mg/dL — ABNORMAL HIGH (ref 8–23)
CO2: 20 mmol/L — ABNORMAL LOW (ref 22–32)
Calcium: 7.4 mg/dL — ABNORMAL LOW (ref 8.9–10.3)
Chloride: 97 mmol/L — ABNORMAL LOW (ref 98–111)
Creatinine, Ser: 1.29 mg/dL — ABNORMAL HIGH (ref 0.61–1.24)
GFR, Estimated: 59 mL/min — ABNORMAL LOW (ref >60)
Glucose, Bld: 108 mg/dL — ABNORMAL HIGH (ref 70–99)
Potassium: 3.7 mmol/L (ref 3.5–5.1)
Sodium: 125 mmol/L — ABNORMAL LOW (ref 135–145)

## 2020-12-14 MED ORDER — POTASSIUM CHLORIDE 2 MEQ/ML IV SOLN: INTRAVENOUS | Status: DC

## 2020-12-14 MED ORDER — KCL IN DEXTROSE-NACL 20-5-0.45 MEQ/L-%-% IV SOLN
INTRAVENOUS | Status: DC
  Filled 2020-12-14 (×4): qty 1000

## 2020-12-14 MED ORDER — SODIUM CHLORIDE 0.9 % IR SOLN: 3000.0000 mL | Status: DC

## 2020-12-14 MED ORDER — HYDRALAZINE HCL 20 MG/ML IJ SOLN: 5.0000 mg | INTRAMUSCULAR | Status: DC | PRN

## 2020-12-14 MED ORDER — SODIUM CHLORIDE 0.9 % IR SOLN
3000.0000 mL | Status: DC
  Administered 2020-12-14 – 2020-12-15 (×4): 3000 mL

## 2020-12-14 MED ORDER — TRAMADOL HCL 50 MG PO TABS: 50.0000 mg | ORAL_TABLET | Freq: Four times a day (QID) | ORAL | Status: DC | PRN

## 2020-12-14 NOTE — ED Triage Notes (Signed)
Patient reports that he currently has a 3 way foley and is having very little urinary output. Patient states his catheter was irrigated at [facility], but wanted him to have continuous irrigation in the ED.

## 2020-12-14 NOTE — H&P (Signed)
CC: Gross hematuria  History of present illness: 70 year old man with high-grade advanced metastatic prostate cancer who has had gross hematuria intermittently now for almost a year.  Recently he was seen in our clinic and Foley catheter placed so that his bladder could be irrigated.  He developed clot retention and was seen in the emergency department by urology.  At that time his catheter was irrigated and appeared to clear up.  He then had his appointment with oncology at [facility] yesterday.  The patient then presented this morning for a voiding trial.  The catheter was removed, although he had lots of dark urine and clot within the catheter prior to being removed.  He represented this afternoon because he was unable to void, and he was noted to be in clot retention.  A.  In the clinic the patient had his catheter irrigated copiously.  However, they were really not able to clear his hematuria.  As such a three-way Foley catheter was exchanged for the to a catheter and he was sent to the emergency department for admission, the admitting office had closed.  The patient states that he has very little pain currently.  He has few comorbidities.  He denies any dysuria or burning.  Review of systems: A 12 point comprehensive review of systems was obtained and is negative unless otherwise stated in the history of present illness.  Patient Active Problem List   Diagnosis Date Noted  . Gross hematuria 12/14/2020  . History of adenomatous polyp of colon 04/15/2019  . Palpitations 03/15/2019  . S/P right THA, AA 01/05/2018  . Positive colorectal cancer screening using Cologuard test   . Hyperglycemia 06/12/2015  . Prostate cancer ([place]) 09/12/2014  . Obese 07/04/2014  . S/P left THA, AA 07/03/2014  . PPM-Medtronic 05/23/2010  . Hyperlipidemia 03/28/2009  . Atrioventricular block, complete ([place]) 03/28/2009  . Essential hypertension 03/16/2008    No current facility-administered medications on file prior  to encounter.   Current Outpatient Medications on File Prior to Encounter  Medication Sig Dispense Refill  . atorvastatin (LIPITOR) 20 MG tablet Take 1 tablet by mouth once daily (Patient taking differently: Take 20 mg by mouth daily.) 90 tablet 2  . calcium carbonate (OS-CAL - DOSED IN MG OF ELEMENTAL CALCIUM) 1250 (500 Ca) MG tablet Take 1 tablet by mouth daily.    . cephALEXin (KEFLEX) 500 MG capsule Take 500 mg by mouth 3 (three) times daily. Start date : 12/11/20    . ferrous sulfate 325 (65 FE) MG tablet Take 325 mg by mouth daily with breakfast.    . leuprolide, 6 Month, (ELIGARD) 45 MG injection Inject 45 mg into the skin every 6 (six) months.    . predniSONE (DELTASONE) 10 MG tablet Take 10 mg by mouth daily.    . valsartan-hydrochlorothiazide (DIOVAN HCT) 160-12.5 MG tablet Take 1 tablet by mouth daily. 90 tablet 3  . aspirin EC 81 MG tablet Take 81 mg by mouth daily.    Marland Kitchen dexamethasone (DECADRON) 4 MG tablet Take 8mg  (2 x 4mg  tab) by mouth twice daily the day before DOCEtaxel and twice daily the day after DOCEtaxel, then as directed (Patient not taking: No sig reported)      Past Medical History:  Diagnosis Date  . Arthritis    OA AND PAIN LEFT HIP  . CHB (complete heart block) ([place])    a. MDT dual chamber pacemaker  . HTN (hypertension)   . Hyperlipidemia   . Presence of permanent cardiac  pacemaker    2000  . Prostate cancer ([place]) 09/2014    Past Surgical History:  Procedure Laterality Date  . ADENOIDECTOMY AS A CHILD    . COLONOSCOPY WITH PROPOFOL N/A 12/30/2017   Procedure: COLONOSCOPY WITH PROPOFOL;  Surgeon: Ladene Artist, MD;  [facility];  Service: Endoscopy;  Laterality: N/A;  . JOINT REPLACEMENT     Right hip replacement Dr. Alvan Dame 01-05-18  . LYMPHADENECTOMY Bilateral 11/06/2014   Procedure: PELVIC LYMPHADENECTOMY;  Surgeon: [name], MD;  [facility];  Service: Urology;  Laterality: Bilateral;  . PACEMAKER INSERTION  2000; 2012   MDT dual  chamber pacemaker implanted 2000 with gen change 2012  . POLYPECTOMY  12/30/2017   Procedure: POLYPECTOMY;  Surgeon: Ladene Artist, MD;  [facility];  Service: Endoscopy;;  . PROSTATE BIOPSY  09/05/14  . ROBOT ASSISTED LAPAROSCOPIC RADICAL PROSTATECTOMY N/A 11/06/2014   Procedure: ROBOTIC ASSISTED LAPAROSCOPIC RADICAL PROSTATECTOMY LEVEL 2;  Surgeon: [name], MD;  [facility];  Service: Urology;  Laterality: N/A;  . TOTAL HIP ARTHROPLASTY Left 07/03/2014   Procedure: LEFT TOTAL HIP ARTHROPLASTY ANTERIOR APPROACH;  Surgeon: [name], MD;  [facility];  Service: Orthopedics;  Laterality: Left;  . TOTAL HIP ARTHROPLASTY Right 01/05/2018   Procedure: RIGHT TOTAL HIP ARTHROPLASTY ANTERIOR APPROACH;  Surgeon: Paralee Cancel, MD;  [facility];  Service: Orthopedics;  Laterality: Right;  70 mins  . WISDOM TEETH EXTRACTIONS      Social History   Tobacco Use  . Smoking status: Never Smoker  . Smokeless tobacco: Never Used  Vaping Use  . Vaping Use: Never used  Substance Use Topics  . Alcohol use: Yes    Alcohol/week: 2.0 - 3.0 standard drinks    Types: 2 - 3 Standard drinks or equivalent per week    Comment: 5 TO 7 DRINKS A WEEK stopped etoh use pending surgery   . Drug use: No    Family History  Problem Relation Age of Onset  . Stroke Mother        multiple. 79.   . Heart disease Mother        hx heart valve problem  . Prostate cancer Father        15 of prostate cancer  . Heart attack Neg Hx   . Hypertension Neg Hx     PE: Vitals:   12/14/20 2000 12/14/20 2030 12/14/20 2100 12/14/20 2128  BP: 131/74 126/74 127/68 133/73  Pulse: 80 84 82 85  Resp: 18 16 18 18   Temp:    98.2 F (36.8 C)  TempSrc:    Oral  SpO2: 100% 100% 100% 100%  Weight:      Height:       Patient appears to be in no acute distress  patient is alert and oriented x3 Atraumatic normocephalic head No cervical or supraclavicular lymphadenopathy appreciated No increased  work of breathing, no audible wheezes/rhonchi Regular sinus rhythm/rate Abdomen is soft, nontender, nondistended, no CVA or suprapubic tenderness Catheter is draining dark burgundy colored urine, but there are no clots. Lower extremities are symmetric without appreciable edema Grossly neurologically intact No identifiable skin lesions  Recent Labs    12/14/20 1739  WBC 12.7*  HGB 7.5*  HCT 22.4*   Recent Labs    12/14/20 1739  NA 125*  K 3.7  CL 97*  CO2 20*  GLUCOSE 108*  BUN 31*  CREATININE 1.29*  CALCIUM 7.4*   No results for input(s):  LABPT, INR in the last 72 hours. No results for input(s): LABURIN in the last 72 hours. Results for orders placed or performed in visit on 11/20/20  Novel Coronavirus, NAA ([facility])     Status: None   Collection Time: 11/20/20  5:17 PM   Specimen: Nasopharyngeal(NP) swabs in vial transport medium   Nasopharynge  Testing  Result Value Ref Range Status   SARS-CoV-2, NAA Not Detected Not Detected Final    Comment: This nucleic acid amplification test was developed and its performance characteristics determined by [employer]. Nucleic acid amplification tests include RT-PCR and TMA. This test has not been FDA cleared or approved. This test has been authorized by FDA under an Emergency Use Authorization (EUA). This test is only authorized for the duration of time the declaration that circumstances exist justifying the authorization of the emergency use of in vitro diagnostic tests for detection of SARS-CoV-2 virus and/or diagnosis of COVID-19 infection under section 564(b)(1) of the Act, 21 U.S.C. 546FKC-1(E) (1), unless the authorization is terminated or revoked sooner. When diagnostic testing is negative, the possibility of a false negative result should be considered in the context of a patient's recent exposures and the presence of clinical signs and symptoms consistent with COVID-19. An individual without symptoms of  COVID-19 and who is not shedding SARS-CoV-2 virus wo uld expect to have a negative (not detected) result in this assay.   SARS-COV-2, NAA 2 DAY TAT     Status: None   Collection Time: 11/20/20  5:17 PM   Nasopharynge  Testing  Result Value Ref Range Status   SARS-CoV-2, NAA 2 DAY TAT Performed  Final    Imaging: none  Imp: The patient has gross hematuria for hemorrhagic radiation cystitis in the setting of metastatic prostate cancer.  His hematuria was recalcitrant to the irrigation within the clinic.  As such he was admitted for continuous bladder irrigation.  At the time of my interview his urine was relatively clear.  Recommendations: I recommend that this patient be admitted and placed on continuous bladder irrigation.  We will try to wean his CBI off.  If we can get his catheter completely clear, and it stays clear, we may build to remove his catheter prior to his discharge.  I anticipate him being discharged tomorrow.    Ardis Hughs

## 2020-12-14 NOTE — ED Provider Notes (Signed)
[facility] DEPT Provider Note   CSN: [id number] Arrival date & time: 12/14/20  1705     History No chief complaint on file.   Devin Mercado is a 70 y.o. male past medical history of prostate cancer, presented to the ED from urology clinic for admission for continuous bladder irrigation.  States bleeding worsened on the 28th.  He is feeling "wiped out."  He is having some left-sided abdominal discomfort.  Not on anticoagulation.    Urologist Dr. Louis Meckel is in the ED and provides additional history.  It appears he had irrigation in the clinic, however they were unable to see improvement and he will need admission for continuous bladder irrigation.  Urology was unable to direct admit outpatient therefore he is coming to the ED.  Urology will admit.  The history is provided by the patient (Dr. Louis Meckel).       Past Medical History:  Diagnosis Date  . Arthritis    OA AND PAIN LEFT HIP  . CHB (complete heart block) ([place])    a. MDT dual chamber pacemaker  . HTN (hypertension)   . Hyperlipidemia   . Presence of permanent cardiac pacemaker    2000  . Prostate cancer ([place]) 09/2014    Patient Active Problem List   Diagnosis Date Noted  . Gross hematuria 12/14/2020  . History of adenomatous polyp of colon 04/15/2019  . Palpitations 03/15/2019  . S/P right THA, AA 01/05/2018  . Positive colorectal cancer screening using Cologuard test   . Hyperglycemia 06/12/2015  . Prostate cancer ([place]) 09/12/2014  . Obese 07/04/2014  . S/P left THA, AA 07/03/2014  . PPM-Medtronic 05/23/2010  . Hyperlipidemia 03/28/2009  . Atrioventricular block, complete ([place]) 03/28/2009  . Essential hypertension 03/16/2008    Past Surgical History:  Procedure Laterality Date  . ADENOIDECTOMY AS A CHILD    . COLONOSCOPY WITH PROPOFOL N/A 12/30/2017   Procedure: COLONOSCOPY WITH PROPOFOL;  Surgeon: Ladene Artist, MD;  Location: [facility];  Service: Endoscopy;   Laterality: N/A;  . JOINT REPLACEMENT     Right hip replacement Dr. Alvan Dame 01-05-18  . LYMPHADENECTOMY Bilateral 11/06/2014   Procedure: PELVIC LYMPHADENECTOMY;  Surgeon: [name], MD;  Location: [facility];  Service: Urology;  Laterality: Bilateral;  . PACEMAKER INSERTION  2000; 2012   MDT dual chamber pacemaker implanted 2000 with gen change 2012  . POLYPECTOMY  12/30/2017   Procedure: POLYPECTOMY;  Surgeon: Ladene Artist, MD;  Location: [facility];  Service: Endoscopy;;  . PROSTATE BIOPSY  09/05/14  . ROBOT ASSISTED LAPAROSCOPIC RADICAL PROSTATECTOMY N/A 11/06/2014   Procedure: ROBOTIC ASSISTED LAPAROSCOPIC RADICAL PROSTATECTOMY LEVEL 2;  Surgeon: [name], MD;  Location: [facility];  Service: Urology;  Laterality: N/A;  . TOTAL HIP ARTHROPLASTY Left 07/03/2014   Procedure: LEFT TOTAL HIP ARTHROPLASTY ANTERIOR APPROACH;  Surgeon: [name], MD;  Location: [facility];  Service: Orthopedics;  Laterality: Left;  . TOTAL HIP ARTHROPLASTY Right 01/05/2018   Procedure: RIGHT TOTAL HIP ARTHROPLASTY ANTERIOR APPROACH;  Surgeon: Paralee Cancel, MD;  Location: [facility];  Service: Orthopedics;  Laterality: Right;  70 mins  . WISDOM TEETH EXTRACTIONS         Family History  Problem Relation Age of Onset  . Stroke Mother        multiple. 30.   . Heart disease Mother        hx heart valve problem  . Prostate cancer Father        66  of prostate cancer  . Heart attack Neg Hx   . Hypertension Neg Hx     Social History   Tobacco Use  . Smoking status: Never Smoker  . Smokeless tobacco: Never Used  Vaping Use  . Vaping Use: Never used  Substance Use Topics  . Alcohol use: Yes    Alcohol/week: 2.0 - 3.0 standard drinks    Types: 2 - 3 Standard drinks or equivalent per week    Comment: 5 TO 7 DRINKS A WEEK stopped etoh use pending surgery   . Drug use: No    Home Medications Prior to Admission medications   Medication Sig Start Date End Date Taking? Authorizing Provider  aspirin EC 81  MG tablet Take 81 mg by mouth daily.    [provider]  atorvastatin (LIPITOR) 20 MG tablet Take 1 tablet by mouth once daily 06/12/20   [name], MD  Darolutamide (NUBEQA) 300 MG TABS Take by mouth.    [provider]  leuprolide, 6 Month, (ELIGARD) 45 MG injection Inject 45 mg into the skin every 6 (six) months.    [provider]  valsartan-hydrochlorothiazide (DIOVAN HCT) 160-12.5 MG tablet Take 1 tablet by mouth daily. 03/15/20   [name], MD    Allergies    Patient has no known allergies.  Review of Systems   Review of Systems  All other systems reviewed and are negative.   Physical Exam Updated Vital Signs BP 130/79 (BP Location: Right Arm)   Pulse 92   Temp 97.9 F (36.6 C) (Oral)   Resp 17   Ht 5\' 11"  (1.803 m)   Wt 104.3 kg   SpO2 98%   BMI 32.08 kg/m   Physical Exam Vitals and nursing note reviewed.  Constitutional:      General: He is not in acute distress.    Appearance: He is well-developed.  HENT:     Head: Normocephalic and atraumatic.  Eyes:     Conjunctiva/sclera: Conjunctivae normal.  Cardiovascular:     Rate and Rhythm: Normal rate.  Pulmonary:     Effort: Pulmonary effort is normal.  Abdominal:     General: Bowel sounds are normal.     Palpations: Abdomen is soft.  Genitourinary:    Comments: Foley cath in place Skin:    General: Skin is warm.     Coloration: Skin is pale.  Neurological:     Mental Status: He is alert.  Psychiatric:        Behavior: Behavior normal.     ED Results / Procedures / Treatments   Labs (all labs ordered are listed, but only abnormal results are displayed) Labs Reviewed  BASIC METABOLIC PANEL - Abnormal; Notable for the following components:      Result Value   Sodium 125 (*)    Chloride 97 (*)    CO2 20 (*)    Glucose, Bld 108 (*)    BUN 31 (*)    Creatinine, Ser 1.29 (*)    Calcium 7.4 (*)    GFR, Estimated 59 (*)    All other components within normal  limits  CBC WITH DIFFERENTIAL/PLATELET - Abnormal; Notable for the following components:   WBC 12.7 (*)    RBC 2.47 (*)    Hemoglobin 7.5 (*)    HCT 22.4 (*)    Neutro Abs 10.8 (*)    Lymphs Abs 0.6 (*)    Abs Immature Granulocytes 0.26 (*)    All other components  within normal limits  SARS CORONAVIRUS 2 (TAT 6-24 HRS)  BASIC METABOLIC PANEL  CBC  TYPE AND SCREEN    EKG None  Radiology No results found.  Procedures Procedures   Medications Ordered in ED Medications  sodium chloride irrigation 0.9 % 3,000 mL (has no administration in time range)  traMADol (ULTRAM) tablet 50-100 mg (has no administration in time range)  hydrALAZINE (APRESOLINE) injection 5 mg (has no administration in time range)  dextrose 5 % and 0.45 % NaCl with KCl 20 mEq/L infusion (has no administration in time range)    ED Course  I have reviewed the triage vital signs and the nursing notes.  Pertinent labs & imaging results that were available during my care of the patient were reviewed by me and considered in my medical decision making (see chart for details).    MDM Rules/Calculators/A&P                          Patient is sent to the ED for admission by urology, Dr. Louis Meckel.  Has been having hematuria in the setting of prostate cancer.  Has Foley catheter in place.  Outpatient urology was unable to see improvement with bladder irrigation in the clinic today therefore needs admission for CBI.  He does have anemia with hemoglobin of 7.5.  He states he feels "wiped out" and is pale.  Renal function without change from baseline.  Sodium level is low 125.  Orders placed by urology for admission, CBI and IV fluids.  Patient may need transfusion, type and screen is ordered.  Currently is hemodynamically stable. Final Clinical Impression(s) / ED Diagnoses Final diagnoses:  Gross hematuria    Rx / DC Orders ED Discharge Orders    None       Gloriana Piltz, [place] N, PA-C 12/14/20 2025    Breck Coons, MD 12/20/20 [phone]

## 2020-12-14 NOTE — ED Provider Notes (Signed)
Emergency Medicine Provider Triage Evaluation Note  Devin Mercado , a 70 y.o. male  was evaluated in triage.  Pt complains of urinary retention.  Review of Systems  Positive: Urinary retention Negative: fever  Physical Exam  BP 130/79 (BP Location: Right Arm)   Pulse 92   Temp 97.9 F (36.6 C) (Oral)   Resp 17   Ht 5\' 11"  (1.803 m)   Wt 104.3 kg   SpO2 98%   BMI 32.08 kg/m  Gen:   Awake, no distress   Resp:  Normal effort  MSK:   Moves extremities without difficulty  Other:    Medical Decision Making  Medically screening exam initiated at 5:28 PM.  Appropriate orders placed.  Devin Mercado was informed that the remainder of the evaluation will be completed by another provider, this initial triage assessment does not replace that evaluation, and the importance of remaining in the ED until their evaluation is complete.  Pt with indwelling foley, have had clot that clogged the foley for the past few days.  Was seen by [facility] today and had a 3 way foley placed, sent here for continuous bladder irrigation per pt.    Domenic Moras, PA-C 12/14/20 1735    [name], MD 12/14/20 2246

## 2020-12-15 DIAGNOSIS — R310 Gross hematuria: Secondary | ICD-10-CM | POA: Diagnosis not present

## 2020-12-15 DIAGNOSIS — R339 Retention of urine, unspecified: Secondary | ICD-10-CM | POA: Diagnosis not present

## 2020-12-15 LAB — CBC
HCT: 20.4 % — ABNORMAL LOW (ref 39.0–52.0)
Hemoglobin: 6.9 g/dL — CL (ref 13.0–17.0)
MCH: 30.3 pg (ref 26.0–34.0)
MCHC: 33.8 g/dL (ref 30.0–36.0)
MCV: 89.5 fL (ref 80.0–100.0)
Platelets: 170 10*3/uL (ref 150–400)
RBC: 2.28 MIL/uL — ABNORMAL LOW (ref 4.22–5.81)
RDW: 13.2 % (ref 11.5–15.5)
WBC: 7.6 10*3/uL (ref 4.0–10.5)
nRBC: 0 % (ref 0.0–0.2)

## 2020-12-15 LAB — BASIC METABOLIC PANEL
Anion gap: 6 (ref 5–15)
BUN: 25 mg/dL — ABNORMAL HIGH (ref 8–23)
CO2: 24 mmol/L (ref 22–32)
Calcium: 7.7 mg/dL — ABNORMAL LOW (ref 8.9–10.3)
Chloride: 103 mmol/L (ref 98–111)
Creatinine, Ser: 1.04 mg/dL (ref 0.61–1.24)
GFR, Estimated: >60 mL/min (ref >60)
Glucose, Bld: 122 mg/dL — ABNORMAL HIGH (ref 70–99)
Potassium: 4.2 mmol/L (ref 3.5–5.1)
Sodium: 133 mmol/L — ABNORMAL LOW (ref 135–145)

## 2020-12-15 LAB — HEMOGLOBIN AND HEMATOCRIT, BLOOD
HCT: 25.2 % — ABNORMAL LOW (ref 39.0–52.0)
Hemoglobin: 8.6 g/dL — ABNORMAL LOW (ref 13.0–17.0)

## 2020-12-15 LAB — PREPARE RBC (CROSSMATCH)

## 2020-12-15 LAB — SARS CORONAVIRUS 2 (TAT 6-24 HRS): SARS Coronavirus 2: NEGATIVE

## 2020-12-15 MED ORDER — CHLORHEXIDINE GLUCONATE CLOTH 2 % EX PADS: 6.0000 | MEDICATED_PAD | Freq: Every day | CUTANEOUS | Status: DC

## 2020-12-15 MED ORDER — SODIUM CHLORIDE 0.9% IV SOLUTION: Freq: Once | INTRAVENOUS | Status: DC

## 2020-12-15 NOTE — Progress Notes (Signed)
Tried to put catheter in for patient's discharge. Had to call [facility] on 4th floor. Then Dr. Louis Meckel was called to insert catheter because of resistance.

## 2020-12-15 NOTE — Progress Notes (Signed)
Attempted unsuccessfully to insert 54F foley.  Bright red urine returned in catheter but foley was not fully inserted.  Irrigated x1 and thick bloody urine returned.  2nd attempt at irrigation, unable to get any urine back.  Foley still only 1/2 way inserted so did not blow up balloon.  Notified MD. Andre Lefort

## 2020-12-15 NOTE — Progress Notes (Signed)
Urology Inpatient Progress Report  Gross hematuria [R31.0]    Intv/Subj: No acute events overnight. Patient is without complaint.. Hgb 6.9 this AM CBI clear on slow gtt  Active Problems:   Gross hematuria  Current Facility-Administered Medications  Medication Dose Route Frequency Provider Last Rate Last Admin  . 0.9 %  sodium chloride infusion (Manually program via Guardrails IV Fluids)   Intravenous Once Ardis Hughs, MD      . dextrose 5 % and 0.45 % NaCl with KCl 20 mEq/L infusion   Intravenous Continuous Ardis Hughs, MD 100 mL/hr at 12/14/20 2047 New Bag at 12/14/20 2047  . hydrALAZINE (APRESOLINE) injection 5 mg  5 mg Intravenous Q4H PRN Ardis Hughs, MD      . sodium chloride irrigation 0.9 % 3,000 mL  3,000 mL Irrigation Continuous Ardis Hughs, MD   3,000 mL at 12/15/20 6789  . traMADol (ULTRAM) tablet 50-100 mg  50-100 mg Oral Q6H PRN Ardis Hughs, MD         Objective: Vital: Vitals:   12/14/20 2100 12/14/20 2128 12/15/20 0123 12/15/20 0543  BP: 127/68 133/73 (!) 141/64 130/65  Pulse: 82 85 68 73  Resp: 18 18 15 18   Temp:  98.2 F (36.8 C) 98.6 F (37 C) 98.3 F (36.8 C)  TempSrc:  Oral Oral Oral  SpO2: 100% 100% 100% 99%  Weight:      Height:       I/Os: I/O last 3 completed shifts: In: -  Out: 38101 [Urine:18225]  Physical Exam:  General: Patient is in no apparent distress Lungs: Normal respiratory effort, chest expands symmetrically. GI: The abdomen is soft and nontender without mass. Foley: clear on slow CBI  Ext: lower extremities symmetric  Lab Results: Recent Labs    12/14/20 1739 12/15/20 0442  WBC 12.7* 7.6  HGB 7.5* 6.9*  HCT 22.4* 20.4*   Recent Labs    12/14/20 1739 12/15/20 0442  NA 125* 133*  K 3.7 4.2  CL 97* 103  CO2 20* 24  GLUCOSE 108* 122*  BUN 31* 25*  CREATININE 1.29* 1.04  CALCIUM 7.4* 7.7*   No results for input(s): LABPT, INR in the last 72 hours. No results for input(s):  LABURIN in the last 72 hours. Results for orders placed or performed during the hospital encounter of 12/14/20  SARS CORONAVIRUS 2 (TAT 6-24 HRS) Nasopharyngeal Nasopharyngeal Swab     Status: None   Collection Time: 12/14/20  8:23 PM   Specimen: Nasopharyngeal Swab  Result Value Ref Range Status   SARS Coronavirus 2 NEGATIVE NEGATIVE Final    Comment: (NOTE) SARS-CoV-2 target nucleic acids are NOT DETECTED.  The SARS-CoV-2 RNA is generally detectable in upper and lower respiratory specimens during the acute phase of infection. Negative results do not preclude SARS-CoV-2 infection, do not rule out co-infections with other pathogens, and should not be used as the sole basis for treatment or other patient management decisions. Negative results must be combined with clinical observations, patient history, and epidemiological information. The expected result is Negative.  Fact Sheet for Patients: SugarRoll.be  Fact Sheet for Healthcare Providers: https://www.woods-mathews.com/  This test is not yet approved or cleared by the [place] FDA and  has been authorized for detection and/or diagnosis of SARS-CoV-2 by FDA under an Emergency Use Authorization (EUA). This EUA will remain  in effect (meaning this test can be used) for the duration of the COVID-19 declaration under Se ction 564(b)(1) of the Act,  21 U.S.C. section 360bbb-3(b)(1), unless the authorization is terminated or revoked sooner.  Performed at [facility], [place] [address], [place], [place] [address]     Studies/Results: No results found.  Assessment: Gross hematuria from radiation cystitis.  Clearing on CBI. Anemia from acute blood loss.  Plan: Transfuse 1 unit PRBC Stop CBI and see how urine looks in 2 hours, possibly remove catheter Advance diet to regular Recheck patient in a few hours, possibly home today  Louis Meckel, MD Urology 12/15/2020, 8:12 AM

## 2020-12-15 NOTE — Progress Notes (Signed)
Date and time results received: 12/15/20 @ 0515  Test: HGB Critical Value: 6.9  Name of Provider Notified: B Herrick  Orders Received? Or Actions Taken?: see new orders  Lannie Fields RN

## 2020-12-15 NOTE — Progress Notes (Signed)
Called by patient's nurse to insert 45F Foley catheter.  Attempted insertion but unable to progress fully.  Notified MD and received order to try 42F foley. Andre Lefort

## 2020-12-16 DIAGNOSIS — R310 Gross hematuria: Secondary | ICD-10-CM | POA: Diagnosis not present

## 2020-12-16 DIAGNOSIS — R339 Retention of urine, unspecified: Secondary | ICD-10-CM | POA: Diagnosis not present

## 2020-12-16 NOTE — Progress Notes (Signed)
Urology Inpatient Progress Report  Gross hematuria [R31.0]   Intv/Subj: The patient was transfused 1 unit of packed red blood cells, repeat hemoglobin demonstrated appropriate response.  Foley catheter was removed, but the patient was unable to void.  2 attempts by the house staff to replace the Foley catheter were unsuccessful.  As such, his catheter was placed with the cystoscope over a wire.  Large clots were evacuated and the patient was continued on CBI.  Overnight, CBI remained clear.  The patient had no bladder spasms or any significant pain.  His night was uneventful.  Active Problems:   Gross hematuria  Current Facility-Administered Medications  Medication Dose Route Frequency Provider Last Rate Last Admin  . Chlorhexidine Gluconate Cloth 2 % PADS 6 each  6 each Topical Daily Ardis Hughs, MD      . hydrALAZINE (APRESOLINE) injection 5 mg  5 mg Intravenous Q4H PRN Ardis Hughs, MD      . traMADol Veatrice Bourbon) tablet 50-100 mg  50-100 mg Oral Q6H PRN Ardis Hughs, MD         Objective: Vital: Vitals:   12/15/20 1139 12/15/20 1352 12/15/20 2147 12/16/20 0529  BP: 130/63 136/72 126/60 129/74  Pulse: 71 77 79 74  Resp: 16 18 18 18   Temp: 98.9 F (37.2 C) 98.8 F (37.1 C) (!) 100.4 F (38 C) 99.1 F (37.3 C)  TempSrc: Oral Oral Oral Oral  SpO2: 99% 98% 98% 100%  Weight:      Height:       I/Os: I/O last 3 completed shifts: In: 2993 [P.O.:720; I.V.:2740; Blood:358] Out: 23700 [Urine:23700]  Physical Exam:  General: Patient is in no apparent distress Lungs: Normal respiratory effort, chest expands symmetrically. GI:The abdomen is soft and nontender without mass. Foley: Clear on very slow CBI Ext: lower extremities symmetric  Lab Results: Recent Labs    12/14/20 1739 12/15/20 0442 12/15/20 1343  WBC 12.7* 7.6  --   HGB 7.5* 6.9* 8.6*  HCT 22.4* 20.4* 25.2*   Recent Labs    12/14/20 1739 12/15/20 0442  NA 125* 133*  K 3.7 4.2  CL  97* 103  CO2 20* 24  GLUCOSE 108* 122*  BUN 31* 25*  CREATININE 1.29* 1.04  CALCIUM 7.4* 7.7*   No results for input(s): LABPT, INR in the last 72 hours. No results for input(s): LABURIN in the last 72 hours. Results for orders placed or performed during the hospital encounter of 12/14/20  SARS CORONAVIRUS 2 (TAT 6-24 HRS) Nasopharyngeal Nasopharyngeal Swab     Status: None   Collection Time: 12/14/20  8:23 PM   Specimen: Nasopharyngeal Swab  Result Value Ref Range Status   SARS Coronavirus 2 NEGATIVE NEGATIVE Final    Comment: (NOTE) SARS-CoV-2 target nucleic acids are NOT DETECTED.  The SARS-CoV-2 RNA is generally detectable in upper and lower respiratory specimens during the acute phase of infection. Negative results do not preclude SARS-CoV-2 infection, do not rule out co-infections with other pathogens, and should not be used as the sole basis for treatment or other patient management decisions. Negative results must be combined with clinical observations, patient history, and epidemiological information. The expected result is Negative.  Fact Sheet for Patients: SugarRoll.be  Fact Sheet for Healthcare Providers: https://www.woods-mathews.com/  This test is not yet approved or cleared by the [place] FDA and  has been authorized for detection and/or diagnosis of SARS-CoV-2 by FDA under an Emergency Use Authorization (EUA). This EUA will remain  in  effect (meaning this test can be used) for the duration of the COVID-19 declaration under Se ction 564(b)(1) of the Act, 21 U.S.C. section 360bbb-3(b)(1), unless the authorization is terminated or revoked sooner.  Performed at [facility], [place] [address], [place], [place] [address]     Studies/Results: No results found.  Assessment: The patient's gross hematuria has cleared, his Foley catheter is draining well.  He does have a very small false passage.  His hemoglobin  responded well to the transfusion.  Plan: Plan to discharge the patient today with the catheter in place, he will be scheduled for a voiding trial early next week.   Louis Meckel, MD Urology 12/16/2020, 8:19 AM

## 2020-12-16 NOTE — Progress Notes (Signed)
Discharge instructions discussed with patient, including Foley catheter care, verbalized agreement and understanding

## 2020-12-16 NOTE — Procedures (Signed)
Indications: The patient had his catheter removed around noon.  After 4 and half hours he was only able to void small dribbles.  As such, prior to his pending discharge we opted to replace catheter.  Unfortunately, the nursing staff was unable to pass his catheter successfully.  Procedure: Cystourethroscopy with catheter placement over wire.  Description: Informed consent was obtained.  He was then prepped and draped in the routine sterile fashion.  I then passed a 16 French flexible cystoscope to the patient's urethra.  I noted in the bulbous urethra a small posterior false passage.  There is also a small amount of clot within the urethra.  Once I found his true lumen I was able to easily get into the patient's bladder.  This demonstrated significant amount of organized clot, but there were was otherwise fairly clear urine in his bladder.  Advanced a sensor wire through the scope and remove the scope over the wire.  I then advanced a 63 [place] three-way hematuria catheter over the wire and into the patient's bladder.  I drained 550 cc of dark urine.  I then proceeded to irrigate the patient's bladder with approximately 1 L of normal saline and was able to remove a significant amount of large clots.  I then placed the patient on continuous bladder irrigation.  Disposition: The patient will not be discharged this afternoon, instead we will plan to keep him 1 more night for observation.

## 2020-12-16 NOTE — Discharge Summary (Signed)
Date of admission: 12/14/2020  Date of discharge: 12/16/2020  Admission diagnosis: Gross hematuria and advanced prostate cancer  Discharge diagnosis: Same  Secondary diagnoses:  Patient Active Problem List   Diagnosis Date Noted  . Gross hematuria 12/14/2020  . History of adenomatous polyp of colon 04/15/2019  . Palpitations 03/15/2019  . S/P right THA, AA 01/05/2018  . Positive colorectal cancer screening using Cologuard test   . Hyperglycemia 06/12/2015  . Prostate cancer ([place]) 09/12/2014  . Obese 07/04/2014  . S/P left THA, AA 07/03/2014  . PPM-Medtronic 05/23/2010  . Hyperlipidemia 03/28/2009  . Atrioventricular block, complete ([place]) 03/28/2009  . Essential hypertension 03/16/2008    Procedures performed:   History and Physical: For full details, please see admission history and physical. Briefly, Devin Mercado is a 70 y.o. year old patient with history of advanced prostate cancer.  He had his prostate removed in 2016 and subsequently had adjuvant radiation.  He then developed recurrence and is being treated for his systemic disease at [facility].  He developed gross hematuria about a week ago.  We are unable to get the bleeding to stop in clinic, and he subsequently admitted for CBI.  Hospital Course:  Patient was admitted through the emergency department.  He remained on continuous bladder irrigation o overnight.  We stopped it early on hospital day #2 and his urine remained relatively clear, with no worsening of the light of bleeding.  He was subsequently given a voiding trial which he failed.  We had to replace his Foley catheter over a wire using the cystoscope.  His urine remained clear overnight on a very slow CBI, as such he was discharged home with his Foley catheter and plans to remove it early next week.  The patient's anemia from acute blood loss was treated with 1 unit of packed red blood cells.  He responded appropriately.    Laboratory values:  Recent Labs     12/14/20 1739 12/15/20 0442 12/15/20 1343  WBC 12.7* 7.6  --   HGB 7.5* 6.9* 8.6*  HCT 22.4* 20.4* 25.2*   Recent Labs    12/14/20 1739 12/15/20 0442  NA 125* 133*  K 3.7 4.2  CL 97* 103  CO2 20* 24  GLUCOSE 108* 122*  BUN 31* 25*  CREATININE 1.29* 1.04  CALCIUM 7.4* 7.7*   No results for input(s): LABPT, INR in the last 72 hours. No results for input(s): LABURIN in the last 72 hours. Results for orders placed or performed during the hospital encounter of 12/14/20  SARS CORONAVIRUS 2 (TAT 6-24 HRS) Nasopharyngeal Nasopharyngeal Swab     Status: None   Collection Time: 12/14/20  8:23 PM   Specimen: Nasopharyngeal Swab  Result Value Ref Range Status   SARS Coronavirus 2 NEGATIVE NEGATIVE Final    Comment: (NOTE) SARS-CoV-2 target nucleic acids are NOT DETECTED.  The SARS-CoV-2 RNA is generally detectable in upper and lower respiratory specimens during the acute phase of infection. Negative results do not preclude SARS-CoV-2 infection, do not rule out co-infections with other pathogens, and should not be used as the sole basis for treatment or other patient management decisions. Negative results must be combined with clinical observations, patient history, and epidemiological information. The expected result is Negative.  Fact Sheet for Patients: SugarRoll.be  Fact Sheet for Healthcare Providers: https://www.woods-mathews.com/  This test is not yet approved or cleared by the [place] FDA and  has been authorized for detection and/or diagnosis of SARS-CoV-2 by FDA under an Emergency  Use Authorization (EUA). This EUA will remain  in effect (meaning this test can be used) for the duration of the COVID-19 declaration under Se ction 564(b)(1) of the Act, 21 U.S.C. section 360bbb-3(b)(1), unless the authorization is terminated or revoked sooner.  Performed at [facility], [place] [address], [place], [place] [address]      Disposition: Home  Discharge instruction: The patient was instructed to be ambulatory but told to refrain from heavy lifting, strenuous activity, or driving.   Discharge medications:  Allergies as of 12/16/2020   No Known Allergies     Medication List    STOP taking these medications   aspirin EC 81 MG tablet   dexamethasone 4 MG tablet Commonly known as: DECADRON     TAKE these medications   atorvastatin 20 MG tablet Commonly known as: LIPITOR Take 1 tablet by mouth once daily   calcium carbonate 1250 (500 Ca) MG tablet Commonly known as: OS-CAL - dosed in mg of elemental calcium Take 1 tablet by mouth daily.   cephALEXin 500 MG capsule Commonly known as: KEFLEX Take 500 mg by mouth 3 (three) times daily. Start date : 12/11/20   ferrous sulfate 325 (65 FE) MG tablet Take 325 mg by mouth daily with breakfast.   leuprolide (6 Month) 45 MG injection Commonly known as: ELIGARD Inject 45 mg into the skin every 6 (six) months.   predniSONE 10 MG tablet Commonly known as: DELTASONE Take 10 mg by mouth daily.   valsartan-hydrochlorothiazide 160-12.5 MG tablet Commonly known as: Diovan HCT Take 1 tablet by mouth daily.       Followup:   Follow-up Information    [name], MD. Call on 12/19/2020.   Specialty: Urology Contact information: [place] [address] [phone]

## 2020-12-16 NOTE — Discharge Instructions (Signed)
Care After Hospitalization  Refer to this sheet in the next few weeks. These discharge instructions provide you with general information on caring for yourself after you leave the hospital. Your caregiver may also give you specific instructions. Your treatment has been planned according to the most current medical practices available, but unavoidable complications sometimes occur. If you have any problems or questions after discharge, please call your caregiver.  HOME CARE INSTRUCTIONS   Medications  You may receive medicine for pain management. As your level of discomfort decreases, adjustments in your pain medicines may be made.   Take all medicines as directed.   If you are on aspirin, it would be best not to restart the aspirin until the blood in the urine clears Hygiene  You can take a shower anytime  Activity  You will be encouraged to get out of bed as much as possible and increase your activity level as tolerated.   Spend the first week in and around your home. For 3 weeks, avoid the following:   Straining.   Running.   Strenuous work.   Walks longer than a few blocks.   Riding for extended periods.   Sexual relations.   Do not lift heavy objects (more than 20 pounds) for at least 1 month. When lifting, use your arms instead of your abdominal muscles.   You will be encouraged to walk as tolerated. Do not exert yourself. Increase your activity level slowly. Remember that it is important to keep moving after an operation of any type. This cuts down on the possibility of developing blood clots.   Your caregiver will tell you when you can resume driving and light housework. Discuss this at your first office visit after discharge. Diet  No special diet.   However, if you are on a special diet for another medical problem, it should be continued.   Normal fluid intake is usually recommended.   Avoid alcohol and caffeinated drinks for 2 weeks. They irritate the bladder.  Decaffeinated drinks are okay.   Avoid spicy foods.  Bladder Function  For the first 10 days, empty the bladder whenever you feel a definite desire. Do not try to hold the urine for long periods of time.   Urinating once or twice a night even after you are healed is not uncommon.   You may see some recurrence of blood in the urine after discharge from the hospital. This usually happens within 2 weeks after the procedure.If this occurs, force fluids again as you did in the hospital and reduce your activity.  Bowel Function  You may experience some constipation. This can be minimized by increasing fluids and fiber in your diet. Drink enough water and fluids to keep your urine clear or pale yellow.   A stool softener may be prescribed for use at home. Do not strain to move your bowels.   If you are requiring increased pain medicine, it is important that you take stool softeners to prevent constipation. This will help to promote proper healing by reducing the need to strain to move your bowels.  Posthospital Visit  Arrange the date and time of your after surgery visit with your caregiver.  Return to Work  After your recovery is complete, you will be able to return to work and resume all activities. Your caregiver will inform you when you can return to work.     SEEK IMMEDIATE MEDICAL CARE IF:   You are suddenly unable to urinate. Check to see if  there are any kinks in the drainage tubing that may cause this. If you cannot find any kinks, call your caregiver immediately. This is an emergency.   You develop shortness of breath or chest pains.   Bleeding persists or clots develop in your urine.   You have a fever.   You develop pain in your back or over your lower belly (abdomen).   You develop pain or swelling in your legs.   Any problems you are having get worse rather than better.  MAKE SURE YOU:   Understand these instructions.   Will watch your condition.   Will get help  right away if you are not doing well or get worse.   Foley Catheter Care A soft, flexible tube (Foley catheter) may have been placed in your bladder to drain urine and fluid. Follow these instructions: Taking Care of the Catheter  Keep the area where the catheter leaves your body clean.   Attach the catheter to the leg so there is no tension on the catheter.   Keep the drainage bag below the level of the bladder, but keep it OFF the floor.   Do not take long soaking baths. Your caregiver will give instructions about showering.   Wash your hands before touching ANYTHING related to the catheter or bag.   Using mild soap and warm water on a washcloth:   Clean the area closest to the catheter insertion site using a circular motion around the catheter.   Clean the catheter itself by wiping AWAY from the insertion site for several inches down the tube.   NEVER wipe upward as this could sweep bacteria up into the urethra (tube in your body that normally drains the bladder) and cause infection.   Place a small amount of sterile lubricant at the tip of the penis where the catheter is entering.  Taking Care of the Drainage Bags  Two drainage bags may be taken home: a large overnight drainage bag, and a smaller leg bag which fits underneath clothing.   It is okay to wear the overnight bag at any time, but NEVER wear the smaller leg bag at night.   Keep the drainage bag well below the level of your bladder. This prevents backflow of urine into the bladder and allows the urine to drain freely.   Anchor the tubing to your leg to prevent pulling or tension on the catheter. Use tape or a leg strap provided by the hospital.   Empty the drainage bag when it is 1/2 to 3/4 full. Wash your hands before and after touching the bag.   Periodically check the tubing for kinks to make sure there is no pressure on the tubing which could restrict the flow of urine.  Changing the Drainage Bags  Cleanse  both ends of the clean bag with alcohol before changing.   Pinch off the rubber catheter to avoid urine spillage during the disconnection.   Disconnect the dirty bag and connect the clean one.   Empty the dirty bag carefully to avoid a urine spill.   Attach the new bag to the leg with tape or a leg strap.  Cleaning the Drainage Bags  Whenever a drainage bag is disconnected, it must be cleaned quickly so it is ready for the next use.   Wash the bag in warm, soapy water.   Rinse the bag thoroughly with warm water.   Soak the bag for 30 minutes in a solution of white vinegar and water (  1 cup vinegar to 1 quart warm water).   Rinse with warm water.  SEEK MEDICAL CARE IF:   You have chills or night sweats.   You are leaking around your catheter or have problems with your catheter. It is not uncommon to have sporadic leakage around your catheter as a result of bladder spasms. If the leakage stops, there is not much need for concern. If you are uncertain, call your caregiver.   You develop side effects that you think are coming from your medicines.  SEEK IMMEDIATE MEDICAL CARE IF:   You are suddenly unable to urinate. Check to see if there are any kinks in the drainage tubing that may cause this. If you cannot find any kinks, call your caregiver immediately. This is an emergency.   You develop shortness of breath or chest pains.   Bleeding persists or clots develop in your urine.   You have a fever.   You develop pain in your back or over your lower belly (abdomen).   You develop pain or swelling in your legs.   Any problems you are having get worse rather than better.  MAKE SURE YOU:   Understand these instructions.   Will watch your condition.   Will get help right away if you are not doing well or get worse.

## 2020-12-17 LAB — BPAM RBC
Blood Product Expiration Date: 202206212359
ISSUE DATE / TIME: 202206040828
Unit Type and Rh: 6200

## 2020-12-17 LAB — TYPE AND SCREEN
ABO/RH(D): A POS
Antibody Screen: NEGATIVE
Unit division: 0

## 2020-12-19 DIAGNOSIS — C61 Malignant neoplasm of prostate: Secondary | ICD-10-CM | POA: Diagnosis not present

## 2020-12-19 DIAGNOSIS — N3041 Irradiation cystitis with hematuria: Secondary | ICD-10-CM | POA: Diagnosis not present

## 2020-12-19 DIAGNOSIS — C7951 Secondary malignant neoplasm of bone: Secondary | ICD-10-CM | POA: Diagnosis not present

## 2020-12-20 DIAGNOSIS — C61 Malignant neoplasm of prostate: Secondary | ICD-10-CM | POA: Diagnosis not present

## 2020-12-20 DIAGNOSIS — N3041 Irradiation cystitis with hematuria: Secondary | ICD-10-CM | POA: Diagnosis not present

## 2020-12-20 DIAGNOSIS — R338 Other retention of urine: Secondary | ICD-10-CM | POA: Diagnosis not present

## 2020-12-20 DIAGNOSIS — R310 Gross hematuria: Secondary | ICD-10-CM | POA: Diagnosis not present

## 2020-12-20 DIAGNOSIS — C7951 Secondary malignant neoplasm of bone: Secondary | ICD-10-CM | POA: Diagnosis not present

## 2020-12-24 DIAGNOSIS — N3041 Irradiation cystitis with hematuria: Secondary | ICD-10-CM | POA: Diagnosis not present

## 2020-12-26 ENCOUNTER — Encounter: Payer: Self-pay | Admitting: Family Medicine

## 2020-12-27 ENCOUNTER — Other Ambulatory Visit: Payer: Self-pay

## 2020-12-27 DIAGNOSIS — D492 Neoplasm of unspecified behavior of bone, soft tissue, and skin: Secondary | ICD-10-CM

## 2020-12-31 DIAGNOSIS — D485 Neoplasm of uncertain behavior of skin: Secondary | ICD-10-CM | POA: Diagnosis not present

## 2020-12-31 DIAGNOSIS — D171 Benign lipomatous neoplasm of skin and subcutaneous tissue of trunk: Secondary | ICD-10-CM | POA: Diagnosis not present

## 2020-12-31 DIAGNOSIS — C4442 Squamous cell carcinoma of skin of scalp and neck: Secondary | ICD-10-CM | POA: Diagnosis not present

## 2020-12-31 DIAGNOSIS — Z85828 Personal history of other malignant neoplasm of skin: Secondary | ICD-10-CM | POA: Diagnosis not present

## 2020-12-31 DIAGNOSIS — D225 Melanocytic nevi of trunk: Secondary | ICD-10-CM | POA: Diagnosis not present

## 2021-01-03 DIAGNOSIS — N393 Stress incontinence (female) (male): Secondary | ICD-10-CM | POA: Diagnosis not present

## 2021-01-03 DIAGNOSIS — N3041 Irradiation cystitis with hematuria: Secondary | ICD-10-CM | POA: Diagnosis not present

## 2021-01-03 DIAGNOSIS — C7951 Secondary malignant neoplasm of bone: Secondary | ICD-10-CM | POA: Diagnosis not present

## 2021-01-03 DIAGNOSIS — C61 Malignant neoplasm of prostate: Secondary | ICD-10-CM | POA: Diagnosis not present

## 2021-01-03 NOTE — Progress Notes (Signed)
Remote pacemaker transmission.   

## 2021-01-07 DIAGNOSIS — R232 Flushing: Secondary | ICD-10-CM | POA: Diagnosis not present

## 2021-01-07 DIAGNOSIS — Z5111 Encounter for antineoplastic chemotherapy: Secondary | ICD-10-CM | POA: Diagnosis not present

## 2021-01-07 DIAGNOSIS — Z79899 Other long term (current) drug therapy: Secondary | ICD-10-CM | POA: Diagnosis not present

## 2021-01-07 DIAGNOSIS — N644 Mastodynia: Secondary | ICD-10-CM | POA: Diagnosis not present

## 2021-01-07 DIAGNOSIS — D630 Anemia in neoplastic disease: Secondary | ICD-10-CM | POA: Diagnosis not present

## 2021-01-07 DIAGNOSIS — Z5112 Encounter for antineoplastic immunotherapy: Secondary | ICD-10-CM | POA: Diagnosis not present

## 2021-01-07 DIAGNOSIS — N390 Urinary tract infection, site not specified: Secondary | ICD-10-CM | POA: Diagnosis not present

## 2021-01-07 DIAGNOSIS — R972 Elevated prostate specific antigen [PSA]: Secondary | ICD-10-CM | POA: Diagnosis not present

## 2021-01-07 DIAGNOSIS — C61 Malignant neoplasm of prostate: Secondary | ICD-10-CM | POA: Diagnosis not present

## 2021-01-07 DIAGNOSIS — C7951 Secondary malignant neoplasm of bone: Secondary | ICD-10-CM | POA: Diagnosis not present

## 2021-01-28 DIAGNOSIS — R9721 Rising PSA following treatment for malignant neoplasm of prostate: Secondary | ICD-10-CM | POA: Diagnosis not present

## 2021-01-28 DIAGNOSIS — Z9079 Acquired absence of other genital organ(s): Secondary | ICD-10-CM | POA: Diagnosis not present

## 2021-01-28 DIAGNOSIS — R32 Unspecified urinary incontinence: Secondary | ICD-10-CM | POA: Diagnosis not present

## 2021-01-28 DIAGNOSIS — R972 Elevated prostate specific antigen [PSA]: Secondary | ICD-10-CM | POA: Diagnosis not present

## 2021-01-28 DIAGNOSIS — I10 Essential (primary) hypertension: Secondary | ICD-10-CM | POA: Diagnosis not present

## 2021-01-28 DIAGNOSIS — C7951 Secondary malignant neoplasm of bone: Secondary | ICD-10-CM | POA: Diagnosis not present

## 2021-01-28 DIAGNOSIS — Z923 Personal history of irradiation: Secondary | ICD-10-CM | POA: Diagnosis not present

## 2021-01-28 DIAGNOSIS — C61 Malignant neoplasm of prostate: Secondary | ICD-10-CM | POA: Diagnosis not present

## 2021-01-28 DIAGNOSIS — D630 Anemia in neoplastic disease: Secondary | ICD-10-CM | POA: Diagnosis not present

## 2021-01-28 DIAGNOSIS — Z8546 Personal history of malignant neoplasm of prostate: Secondary | ICD-10-CM | POA: Diagnosis not present

## 2021-01-28 DIAGNOSIS — Z5111 Encounter for antineoplastic chemotherapy: Secondary | ICD-10-CM | POA: Diagnosis not present

## 2021-01-31 DIAGNOSIS — D485 Neoplasm of uncertain behavior of skin: Secondary | ICD-10-CM | POA: Diagnosis not present

## 2021-01-31 DIAGNOSIS — D235 Other benign neoplasm of skin of trunk: Secondary | ICD-10-CM | POA: Diagnosis not present

## 2021-01-31 DIAGNOSIS — L988 Other specified disorders of the skin and subcutaneous tissue: Secondary | ICD-10-CM | POA: Diagnosis not present

## 2021-02-11 DIAGNOSIS — Z79899 Other long term (current) drug therapy: Secondary | ICD-10-CM | POA: Diagnosis not present

## 2021-02-11 DIAGNOSIS — C61 Malignant neoplasm of prostate: Secondary | ICD-10-CM | POA: Diagnosis not present

## 2021-02-25 DIAGNOSIS — H35033 Hypertensive retinopathy, bilateral: Secondary | ICD-10-CM | POA: Diagnosis not present

## 2021-02-25 DIAGNOSIS — H5213 Myopia, bilateral: Secondary | ICD-10-CM | POA: Diagnosis not present

## 2021-02-25 DIAGNOSIS — H2513 Age-related nuclear cataract, bilateral: Secondary | ICD-10-CM | POA: Diagnosis not present

## 2021-02-25 DIAGNOSIS — I10 Essential (primary) hypertension: Secondary | ICD-10-CM | POA: Diagnosis not present

## 2021-02-25 DIAGNOSIS — H52223 Regular astigmatism, bilateral: Secondary | ICD-10-CM | POA: Diagnosis not present

## 2021-02-25 DIAGNOSIS — H524 Presbyopia: Secondary | ICD-10-CM | POA: Diagnosis not present

## 2021-02-27 DIAGNOSIS — I10 Essential (primary) hypertension: Secondary | ICD-10-CM | POA: Diagnosis not present

## 2021-02-27 DIAGNOSIS — N644 Mastodynia: Secondary | ICD-10-CM | POA: Diagnosis not present

## 2021-02-27 DIAGNOSIS — C61 Malignant neoplasm of prostate: Secondary | ICD-10-CM | POA: Diagnosis not present

## 2021-02-27 DIAGNOSIS — D630 Anemia in neoplastic disease: Secondary | ICD-10-CM | POA: Diagnosis not present

## 2021-02-27 DIAGNOSIS — C7951 Secondary malignant neoplasm of bone: Secondary | ICD-10-CM | POA: Diagnosis not present

## 2021-02-27 DIAGNOSIS — R232 Flushing: Secondary | ICD-10-CM | POA: Diagnosis not present

## 2021-02-27 DIAGNOSIS — Z5111 Encounter for antineoplastic chemotherapy: Secondary | ICD-10-CM | POA: Diagnosis not present

## 2021-02-27 DIAGNOSIS — R32 Unspecified urinary incontinence: Secondary | ICD-10-CM | POA: Diagnosis not present

## 2021-02-27 DIAGNOSIS — Z79818 Long term (current) use of other agents affecting estrogen receptors and estrogen levels: Secondary | ICD-10-CM | POA: Diagnosis not present

## 2021-02-27 DIAGNOSIS — Z79899 Other long term (current) drug therapy: Secondary | ICD-10-CM | POA: Diagnosis not present

## 2021-03-12 ENCOUNTER — Ambulatory Visit (INDEPENDENT_AMBULATORY_CARE_PROVIDER_SITE_OTHER): Payer: Medicare PPO

## 2021-03-12 DIAGNOSIS — I442 Atrioventricular block, complete: Secondary | ICD-10-CM | POA: Diagnosis not present

## 2021-03-13 LAB — CUP PACEART REMOTE DEVICE CHECK
Battery Impedance: 1675 Ohm
Battery Remaining Longevity: 38 mo
Battery Voltage: 2.75 V
Brady Statistic AP VP Percent: 5 %
Brady Statistic AP VS Percent: 0 %
Brady Statistic AS VP Percent: 95 %
Brady Statistic AS VS Percent: 0 %
Date Time Interrogation Session: 20220831085650
Implantable Lead Implant Date: 20000731
Implantable Lead Implant Date: 20000731
Implantable Lead Location: 753859
Implantable Lead Location: 753860
Implantable Lead Model: 5092
Implantable Pulse Generator Implant Date: 20121026
Lead Channel Impedance Value: 324 Ohm
Lead Channel Impedance Value: 482 Ohm
Lead Channel Pacing Threshold Amplitude: 1 V
Lead Channel Pacing Threshold Amplitude: 1.125 V
Lead Channel Pacing Threshold Pulse Width: 0.4 ms
Lead Channel Pacing Threshold Pulse Width: 0.4 ms
Lead Channel Setting Pacing Amplitude: 2 V
Lead Channel Setting Pacing Amplitude: 2.5 V
Lead Channel Setting Pacing Pulse Width: 0.4 ms
Lead Channel Setting Sensing Sensitivity: 2.8 mV

## 2021-03-20 DIAGNOSIS — R32 Unspecified urinary incontinence: Secondary | ICD-10-CM | POA: Diagnosis not present

## 2021-03-20 DIAGNOSIS — D630 Anemia in neoplastic disease: Secondary | ICD-10-CM | POA: Diagnosis not present

## 2021-03-20 DIAGNOSIS — Z79818 Long term (current) use of other agents affecting estrogen receptors and estrogen levels: Secondary | ICD-10-CM | POA: Diagnosis not present

## 2021-03-20 DIAGNOSIS — C61 Malignant neoplasm of prostate: Secondary | ICD-10-CM | POA: Diagnosis not present

## 2021-03-20 DIAGNOSIS — G629 Polyneuropathy, unspecified: Secondary | ICD-10-CM | POA: Diagnosis not present

## 2021-03-20 DIAGNOSIS — C7951 Secondary malignant neoplasm of bone: Secondary | ICD-10-CM | POA: Diagnosis not present

## 2021-03-20 DIAGNOSIS — Z5111 Encounter for antineoplastic chemotherapy: Secondary | ICD-10-CM | POA: Diagnosis not present

## 2021-03-20 DIAGNOSIS — R232 Flushing: Secondary | ICD-10-CM | POA: Diagnosis not present

## 2021-03-20 DIAGNOSIS — I10 Essential (primary) hypertension: Secondary | ICD-10-CM | POA: Diagnosis not present

## 2021-03-26 NOTE — Progress Notes (Signed)
Remote pacemaker transmission.   

## 2021-04-11 DIAGNOSIS — I10 Essential (primary) hypertension: Secondary | ICD-10-CM | POA: Diagnosis not present

## 2021-04-11 DIAGNOSIS — C7951 Secondary malignant neoplasm of bone: Secondary | ICD-10-CM | POA: Diagnosis not present

## 2021-04-11 DIAGNOSIS — C61 Malignant neoplasm of prostate: Secondary | ICD-10-CM | POA: Diagnosis not present

## 2021-04-11 DIAGNOSIS — R232 Flushing: Secondary | ICD-10-CM | POA: Diagnosis not present

## 2021-04-11 DIAGNOSIS — Z79818 Long term (current) use of other agents affecting estrogen receptors and estrogen levels: Secondary | ICD-10-CM | POA: Diagnosis not present

## 2021-04-11 DIAGNOSIS — G629 Polyneuropathy, unspecified: Secondary | ICD-10-CM | POA: Diagnosis not present

## 2021-04-11 DIAGNOSIS — R972 Elevated prostate specific antigen [PSA]: Secondary | ICD-10-CM | POA: Diagnosis not present

## 2021-04-11 DIAGNOSIS — Z5111 Encounter for antineoplastic chemotherapy: Secondary | ICD-10-CM | POA: Diagnosis not present

## 2021-04-11 DIAGNOSIS — R32 Unspecified urinary incontinence: Secondary | ICD-10-CM | POA: Diagnosis not present

## 2021-04-11 DIAGNOSIS — D630 Anemia in neoplastic disease: Secondary | ICD-10-CM | POA: Diagnosis not present

## 2021-04-17 ENCOUNTER — Encounter: Payer: Self-pay | Admitting: Gastroenterology

## 2021-05-02 ENCOUNTER — Encounter: Payer: Medicare PPO | Admitting: Family Medicine

## 2021-05-02 DIAGNOSIS — R232 Flushing: Secondary | ICD-10-CM | POA: Diagnosis not present

## 2021-05-02 DIAGNOSIS — D630 Anemia in neoplastic disease: Secondary | ICD-10-CM | POA: Diagnosis not present

## 2021-05-02 DIAGNOSIS — I10 Essential (primary) hypertension: Secondary | ICD-10-CM | POA: Diagnosis not present

## 2021-05-02 DIAGNOSIS — R32 Unspecified urinary incontinence: Secondary | ICD-10-CM | POA: Diagnosis not present

## 2021-05-02 DIAGNOSIS — C7951 Secondary malignant neoplasm of bone: Secondary | ICD-10-CM | POA: Diagnosis not present

## 2021-05-02 DIAGNOSIS — Z23 Encounter for immunization: Secondary | ICD-10-CM | POA: Diagnosis not present

## 2021-05-02 DIAGNOSIS — C61 Malignant neoplasm of prostate: Secondary | ICD-10-CM | POA: Diagnosis not present

## 2021-05-02 DIAGNOSIS — Z5111 Encounter for antineoplastic chemotherapy: Secondary | ICD-10-CM | POA: Diagnosis not present

## 2021-05-02 DIAGNOSIS — G629 Polyneuropathy, unspecified: Secondary | ICD-10-CM | POA: Diagnosis not present
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
## 2021-05-14 ENCOUNTER — Other Ambulatory Visit: Payer: Self-pay

## 2021-05-14 ENCOUNTER — Encounter: Payer: Self-pay | Admitting: Internal Medicine

## 2021-05-14 ENCOUNTER — Ambulatory Visit: Payer: Medicare PPO | Admitting: Internal Medicine

## 2021-05-14 VITALS — BP 124/70 | HR 70 | Ht 72.0 in | Wt 243.2 lb

## 2021-05-14 DIAGNOSIS — R002 Palpitations: Secondary | ICD-10-CM

## 2021-05-14 DIAGNOSIS — Z950 Presence of cardiac pacemaker: Secondary | ICD-10-CM

## 2021-05-14 DIAGNOSIS — I442 Atrioventricular block, complete: Secondary | ICD-10-CM

## 2021-05-14 NOTE — Patient Instructions (Addendum)
Medication Instructions:  Your physician recommends that you continue on your current medications as directed. Please refer to the Current Medication list given to you today.  Labwork: None ordered.  Testing/Procedures: None ordered.  Follow-Up: Your physician wants you to follow-up in: one year with Cristopher Peru, MD   Remote monitoring is used to monitor your Pacemaker from home. This monitoring reduces the number of office visits required to check your device to one time per year. It allows [place] to keep an eye on the functioning of your device to ensure it is working properly. You are scheduled for a device check from home on 06/11/21. You may send your transmission at any time that day. If you have a wireless device, the transmission will be sent automatically. After your physician reviews your transmission, you will receive a postcard with your next transmission date.  Any Other Special Instructions Will Be Listed Below (If Applicable).  If you need a refill on your cardiac medications before your next appointment, please call your pharmacy.

## 2021-05-14 NOTE — Progress Notes (Signed)
HPI Devin Mercado returns today for followup. He is a pleasant 70 yo man with CHB and HTN, s/p PPM insertion. He has prostate CA with spinal mets. He feels well. He denies chest pain or sob or peripheral edema. He plans to retire in the next few months. He is undergoing chemo/immune therapy. He admits to being sedentary.   No Known Allergies   Current Outpatient Medications  Medication Sig Dispense Refill   atorvastatin (LIPITOR) 20 MG tablet Take 1 tablet by mouth once daily (Patient taking differently: Take 20 mg by mouth daily.) 90 tablet 2   calcium carbonate (OS-CAL - DOSED IN MG OF ELEMENTAL CALCIUM) 1250 (500 Ca) MG tablet Take 1 tablet by mouth daily.     fenofibrate (TRICOR) 145 MG tablet Take by mouth.     ferrous sulfate 325 (65 FE) MG tablet Take 325 mg by mouth daily with breakfast.     leuprolide, 6 Month, (ELIGARD) 45 MG injection Inject 45 mg into the skin every 6 (six) months.     predniSONE (DELTASONE) 10 MG tablet Take 10 mg by mouth daily.     valsartan-hydrochlorothiazide (DIOVAN HCT) 160-12.5 MG tablet Take 1 tablet by mouth daily. 90 tablet 3   cephALEXin (KEFLEX) 500 MG capsule Take 500 mg by mouth 3 (three) times daily. Start date : 12/11/20     No current facility-administered medications for this visit.     Past Medical History:  Diagnosis Date   Arthritis    OA AND PAIN LEFT HIP   CHB (complete heart block) ([place])    a. MDT dual chamber pacemaker   HTN (hypertension)    Hyperlipidemia    Presence of permanent cardiac pacemaker    2000   Prostate cancer ([place]) 09/2014    ROS:   All systems reviewed and negative except as noted in the HPI.   Past Surgical History:  Procedure Laterality Date   ADENOIDECTOMY AS A CHILD     COLONOSCOPY WITH PROPOFOL N/A 12/30/2017   Procedure: COLONOSCOPY WITH PROPOFOL;  Surgeon: Ladene Artist, MD;  [facility];  Service: Endoscopy;  Laterality: N/A;   JOINT REPLACEMENT     Right hip replacement  Dr. Alvan Dame 01-05-18   LYMPHADENECTOMY Bilateral 11/06/2014   Procedure: PELVIC LYMPHADENECTOMY;  Surgeon: [name], MD;  [facility];  Service: Urology;  Laterality: Bilateral;   PACEMAKER INSERTION  2000; 2012   MDT dual chamber pacemaker implanted 2000 with gen change 2012   POLYPECTOMY  12/30/2017   Procedure: POLYPECTOMY;  Surgeon: Ladene Artist, MD;  [facility];  Service: Endoscopy;;   PROSTATE BIOPSY  09/05/14   ROBOT ASSISTED LAPAROSCOPIC RADICAL PROSTATECTOMY N/A 11/06/2014   Procedure: ROBOTIC ASSISTED LAPAROSCOPIC RADICAL PROSTATECTOMY LEVEL 2;  Surgeon: [name], MD;  [facility];  Service: Urology;  Laterality: N/A;   TOTAL HIP ARTHROPLASTY Left 07/03/2014   Procedure: LEFT TOTAL HIP ARTHROPLASTY ANTERIOR APPROACH;  Surgeon: [name], MD;  [facility];  Service: Orthopedics;  Laterality: Left;   TOTAL HIP ARTHROPLASTY Right 01/05/2018   Procedure: RIGHT TOTAL HIP ARTHROPLASTY ANTERIOR APPROACH;  Surgeon: Paralee Cancel, MD;  [facility];  Service: Orthopedics;  Laterality: Right;  70 mins   WISDOM TEETH EXTRACTIONS       Family History  Problem Relation Age of Onset   Stroke Mother        multiple. 19.    Heart disease Mother  hx heart valve problem   Prostate cancer Father        3 of prostate cancer   Heart attack Neg Hx    Hypertension Neg Hx      Social History   Socioeconomic History   Marital status: Married    Spouse name: Not on file   Number of children: 4   Years of education: Not on file   Highest education level: Not on file  Occupational History   Occupation: attorney  Tobacco Use   Smoking status: Never   Smokeless tobacco: Never  Vaping Use   Vaping Use: Never used  Substance and Sexual Activity   Alcohol use: Yes    Alcohol/week: 2.0 - 3.0 standard drinks    Types: 2 - 3 Standard drinks or equivalent per week    Comment: 5 TO 7 DRINKS A WEEK stopped etoh use pending surgery    Drug use: No    Sexual activity: Yes  Other Topics Concern   Not on file  Social History Narrative   Married to Dr. [name] 1979. 4 kids (1 lost to auto accident-3 surviving), no grandkids. 1 daughter married in 2016 and works as Forensic psychologist in [place].       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[place], vacation   Social Determinants of Radio broadcast assistant Strain: Not on [employer] Insecurity: Not on file  Transportation Needs: Not on file  Physical Activity: Not on file  Stress: Not on file  Social Connections: Not on file  Intimate Partner Violence: Not on file     BP 124/70   Pulse 70   Ht 6' (1.829 m)   Wt 243 lb 3.2 oz (110.3 kg)   SpO2 93%   BMI 32.98 kg/m   Physical Exam:  Well appearing NAD HEENT: Unremarkable Neck:  No JVD, no thyromegally Lymphatics:  No adenopathy Back:  No CVA tenderness Lungs:  Clear - with no wheezes HEART:  Regular rate rhythm, no murmurs, no rubs, no clicks Abd:  soft, positive bowel sounds, no organomegally, no rebound, no guarding Ext:  2 plus pulses, no edema, no cyanosis, no clubbing Skin:  No rashes no nodules Neuro:  CN II through XII intact, motor grossly intact  EKG - nsr with ventricular pacing  DEVICE  Normal device function.  See PaceArt for details.   Assess/Plan:  1. CHB - he is asymptomatic, s/p PPM insertion. 2. PPM - his medtronic DDD PM is working normally. 3. HTN - his bp is minimally elevated. No change in his meds.   Devin Overlie Xzavian Semmel,MD

## 2021-05-17 ENCOUNTER — Other Ambulatory Visit (HOSPITAL_BASED_OUTPATIENT_CLINIC_OR_DEPARTMENT_OTHER): Payer: Self-pay

## 2021-05-17 ENCOUNTER — Encounter: Payer: Self-pay | Admitting: Family Medicine

## 2021-05-17 ENCOUNTER — Ambulatory Visit (INDEPENDENT_AMBULATORY_CARE_PROVIDER_SITE_OTHER): Payer: Medicare PPO | Admitting: Family Medicine

## 2021-05-17 ENCOUNTER — Other Ambulatory Visit: Payer: Self-pay

## 2021-05-17 VITALS — BP 138/76 | HR 74 | Temp 97.6°F | Ht 72.01 in | Wt 240.2 lb

## 2021-05-17 DIAGNOSIS — C61 Malignant neoplasm of prostate: Secondary | ICD-10-CM

## 2021-05-17 DIAGNOSIS — Z0000 Encounter for general adult medical examination without abnormal findings: Secondary | ICD-10-CM | POA: Diagnosis not present

## 2021-05-17 DIAGNOSIS — I10 Essential (primary) hypertension: Secondary | ICD-10-CM

## 2021-05-17 DIAGNOSIS — E785 Hyperlipidemia, unspecified: Secondary | ICD-10-CM

## 2021-05-17 MED ORDER — VALSARTAN 160 MG PO TABS
160.0000 mg | ORAL_TABLET | Freq: Every day | ORAL | 3 refills | Status: DC
  Filled 2021-05-17: qty 90, 90d supply, fill #0

## 2021-05-17 NOTE — Patient Instructions (Addendum)
Health Maintenance Due  Topic Date Due   Zoster Vaccines- Shingrix (1 of 2)- wants to hold off since had zostavax Never done   TETANUS/TDAP - definitely need if get cut/scrape/sting but could also get on preventative basis 03/29/2019   COVID-19 Vaccine - consider bivalent booster but I agree there is not a lot of data/studies on this yet 10/25/2020   COLONOSCOPY  [facility] contact Address: [address], [place], [place] [address] Phone: [phone]  12/30/2020   I would like to see how he does on valsartan 160 mg alone.  Asked him to update me in a week or 2 with home blood pressure readings. Home goal <135/85.   I am not sure of any medications on my end that would help with what sounds like stress incontinence- lets get [name] to weigh in- please give him a call  Recommended follow up: Return in about 6 months (around 11/14/2021) for follow up- or sooner if needed. Or year for physical

## 2021-05-22 ENCOUNTER — Other Ambulatory Visit (HOSPITAL_BASED_OUTPATIENT_CLINIC_OR_DEPARTMENT_OTHER): Payer: Self-pay

## 2021-05-27 DIAGNOSIS — I10 Essential (primary) hypertension: Secondary | ICD-10-CM | POA: Diagnosis not present

## 2021-05-27 DIAGNOSIS — N644 Mastodynia: Secondary | ICD-10-CM | POA: Diagnosis not present

## 2021-05-27 DIAGNOSIS — D630 Anemia in neoplastic disease: Secondary | ICD-10-CM | POA: Diagnosis not present

## 2021-05-27 DIAGNOSIS — R232 Flushing: Secondary | ICD-10-CM | POA: Diagnosis not present

## 2021-05-27 DIAGNOSIS — C61 Malignant neoplasm of prostate: Secondary | ICD-10-CM | POA: Diagnosis not present

## 2021-05-27 DIAGNOSIS — C7951 Secondary malignant neoplasm of bone: Secondary | ICD-10-CM | POA: Diagnosis not present

## 2021-05-27 DIAGNOSIS — Z5111 Encounter for antineoplastic chemotherapy: Secondary | ICD-10-CM | POA: Diagnosis not present

## 2021-05-27 DIAGNOSIS — R972 Elevated prostate specific antigen [PSA]: Secondary | ICD-10-CM | POA: Diagnosis not present

## 2021-06-11 ENCOUNTER — Ambulatory Visit (INDEPENDENT_AMBULATORY_CARE_PROVIDER_SITE_OTHER): Payer: Medicare PPO

## 2021-06-11 DIAGNOSIS — I442 Atrioventricular block, complete: Secondary | ICD-10-CM

## 2021-06-12 LAB — CUP PACEART REMOTE DEVICE CHECK
Battery Impedance: 1734 Ohm
Battery Remaining Longevity: 38 mo
Battery Voltage: 2.74 V
Brady Statistic AP VP Percent: 3 %
Brady Statistic AP VS Percent: 0 %
Brady Statistic AS VP Percent: 96 %
Brady Statistic AS VS Percent: 0 %
Date Time Interrogation Session: 20221129193336
Implantable Lead Implant Date: 20000731
Implantable Lead Implant Date: 20000731
Implantable Lead Location: 753859
Implantable Lead Location: 753860
Implantable Lead Model: 5092
Implantable Pulse Generator Implant Date: 20121026
Lead Channel Impedance Value: 334 Ohm
Lead Channel Impedance Value: 525 Ohm
Lead Channel Pacing Threshold Amplitude: 0.875 V
Lead Channel Pacing Threshold Amplitude: 1.25 V
Lead Channel Pacing Threshold Pulse Width: 0.4 ms
Lead Channel Pacing Threshold Pulse Width: 0.4 ms
Lead Channel Setting Pacing Amplitude: 2 V
Lead Channel Setting Pacing Amplitude: 2.5 V
Lead Channel Setting Pacing Pulse Width: 0.4 ms
Lead Channel Setting Sensing Sensitivity: 2.8 mV

## 2021-06-13 DIAGNOSIS — C7951 Secondary malignant neoplasm of bone: Secondary | ICD-10-CM | POA: Diagnosis not present

## 2021-06-13 DIAGNOSIS — Z96643 Presence of artificial hip joint, bilateral: Secondary | ICD-10-CM | POA: Diagnosis not present

## 2021-06-13 DIAGNOSIS — Z9079 Acquired absence of other genital organ(s): Secondary | ICD-10-CM | POA: Diagnosis not present

## 2021-06-13 DIAGNOSIS — C61 Malignant neoplasm of prostate: Secondary | ICD-10-CM | POA: Diagnosis not present

## 2021-06-13 DIAGNOSIS — Z8546 Personal history of malignant neoplasm of prostate: Secondary | ICD-10-CM | POA: Diagnosis not present

## 2021-06-13 DIAGNOSIS — R972 Elevated prostate specific antigen [PSA]: Secondary | ICD-10-CM | POA: Diagnosis not present

## 2021-06-20 NOTE — Progress Notes (Signed)
Remote pacemaker transmission.   

## 2021-07-04 DIAGNOSIS — G629 Polyneuropathy, unspecified: Secondary | ICD-10-CM | POA: Diagnosis not present

## 2021-07-04 DIAGNOSIS — R197 Diarrhea, unspecified: Secondary | ICD-10-CM | POA: Diagnosis not present

## 2021-07-04 DIAGNOSIS — R32 Unspecified urinary incontinence: Secondary | ICD-10-CM | POA: Diagnosis not present

## 2021-07-04 DIAGNOSIS — Z79818 Long term (current) use of other agents affecting estrogen receptors and estrogen levels: Secondary | ICD-10-CM | POA: Diagnosis not present

## 2021-07-04 DIAGNOSIS — Z5112 Encounter for antineoplastic immunotherapy: Secondary | ICD-10-CM | POA: Diagnosis not present

## 2021-07-04 DIAGNOSIS — R232 Flushing: Secondary | ICD-10-CM | POA: Diagnosis not present

## 2021-07-04 DIAGNOSIS — C61 Malignant neoplasm of prostate: Secondary | ICD-10-CM | POA: Diagnosis not present

## 2021-07-04 DIAGNOSIS — R972 Elevated prostate specific antigen [PSA]: Secondary | ICD-10-CM | POA: Diagnosis not present

## 2021-07-04 DIAGNOSIS — C7951 Secondary malignant neoplasm of bone: Secondary | ICD-10-CM | POA: Diagnosis not present

## 2021-07-04 DIAGNOSIS — R5383 Other fatigue: Secondary | ICD-10-CM | POA: Diagnosis not present

## 2021-07-04 DIAGNOSIS — Z5111 Encounter for antineoplastic chemotherapy: Secondary | ICD-10-CM | POA: Diagnosis not present

## 2021-07-11 ENCOUNTER — Other Ambulatory Visit (HOSPITAL_BASED_OUTPATIENT_CLINIC_OR_DEPARTMENT_OTHER): Payer: Self-pay

## 2021-07-11 ENCOUNTER — Encounter: Payer: Self-pay | Admitting: Family

## 2021-07-11 ENCOUNTER — Encounter: Payer: Self-pay | Admitting: Family Medicine

## 2021-07-11 ENCOUNTER — Telehealth (INDEPENDENT_AMBULATORY_CARE_PROVIDER_SITE_OTHER): Payer: Medicare PPO | Admitting: Family

## 2021-07-11 VITALS — Ht 72.0 in | Wt 240.3 lb

## 2021-07-11 DIAGNOSIS — U071 COVID-19: Secondary | ICD-10-CM

## 2021-07-11 MED ORDER — NIRMATRELVIR/RITONAVIR (PAXLOVID)TABLET
3.0000 | ORAL_TABLET | Freq: Two times a day (BID) | ORAL | 0 refills | Status: AC
Start: 2021-07-11 — End: 2021-07-16
  Filled 2021-07-11: qty 30, 5d supply, fill #0

## 2021-07-11 NOTE — Progress Notes (Signed)
MyChart Video Visit    Virtual Visit via Video Note   This visit type was conducted due to national recommendations for restrictions regarding the COVID-19 Pandemic (e.g. social distancing) in an effort to limit this patient's exposure and mitigate transmission in our community. This patient is at least at moderate risk for complications without adequate follow up. This format is felt to be most appropriate for this patient at this time. Physical exam was limited by quality of the video and audio technology used for the visit. CMA was able to get the patient set up on a video visit.  Patient location: Home. Patient and provider in visit Provider location: Office  I discussed the limitations of evaluation and management by telemedicine and the availability of in person appointments. The patient expressed understanding and agreed to proceed.  Visit Date: 07/11/2021  Today's healthcare provider: Jeanie Sewer, NP     Subjective:    Patient ID: Devin Mercado, male    DOB: [birth year], 70 y.o.   MRN: [id number]  Chief Complaint  Patient presents with   Covid Positive    Symptoms started Monday, tested negative Monday. Tested positive today.    Headache   Nasal Congestion   Diarrhea    HPI Upper Respiratory Infection: Symptoms include congestion, headache described as all over, nasal congestion, and diarrhea .  Onset of symptoms was 3 days ago, unchanged since that time. He is drinking moderate amounts of fluids. Evaluation to date: none.  Treatment to date: none.    Past Medical History:  Diagnosis Date   Arthritis    OA AND PAIN LEFT HIP   CHB (complete heart block) ([place])    a. MDT dual chamber pacemaker   HTN (hypertension)    Hyperlipidemia    Presence of permanent cardiac pacemaker    2000   Prostate cancer ([place]) 09/2014    Past Surgical History:  Procedure Laterality Date   ADENOIDECTOMY AS A CHILD     COLONOSCOPY WITH PROPOFOL N/A 12/30/2017    Procedure: COLONOSCOPY WITH PROPOFOL;  Surgeon: Ladene Artist, MD;  [facility];  Service: Endoscopy;  Laterality: N/A;   JOINT REPLACEMENT     Right hip replacement Dr. Alvan Dame 01-05-18   LYMPHADENECTOMY Bilateral 11/06/2014   Procedure: PELVIC LYMPHADENECTOMY;  Surgeon: [name], MD;  [facility];  Service: Urology;  Laterality: Bilateral;   PACEMAKER INSERTION  2000; 2012   MDT dual chamber pacemaker implanted 2000 with gen change 2012   POLYPECTOMY  12/30/2017   Procedure: POLYPECTOMY;  Surgeon: Ladene Artist, MD;  [facility];  Service: Endoscopy;;   PROSTATE BIOPSY  09/05/14   ROBOT ASSISTED LAPAROSCOPIC RADICAL PROSTATECTOMY N/A 11/06/2014   Procedure: ROBOTIC ASSISTED LAPAROSCOPIC RADICAL PROSTATECTOMY LEVEL 2;  Surgeon: [name], MD;  [facility];  Service: Urology;  Laterality: N/A;   TOTAL HIP ARTHROPLASTY Left 07/03/2014   Procedure: LEFT TOTAL HIP ARTHROPLASTY ANTERIOR APPROACH;  Surgeon: [name], MD;  [facility];  Service: Orthopedics;  Laterality: Left;   TOTAL HIP ARTHROPLASTY Right 01/05/2018   Procedure: RIGHT TOTAL HIP ARTHROPLASTY ANTERIOR APPROACH;  Surgeon: Paralee Cancel, MD;  [facility];  Service: Orthopedics;  Laterality: Right;  70 mins   WISDOM TEETH EXTRACTIONS      Outpatient Medications Prior to Visit  Medication Sig Dispense Refill   atorvastatin (LIPITOR) 20 MG tablet Take 1 tablet by mouth once daily (Patient taking differently: Take 20 mg by mouth daily.) 90 tablet  2   calcium carbonate (OS-CAL - DOSED IN MG OF ELEMENTAL CALCIUM) 1250 (500 Ca) MG tablet Take 1 tablet by mouth daily.     denosumab (XGEVA) 120 MG/1.7ML SOLN injection Inject into the skin.     fenofibrate (TRICOR) 145 MG tablet Take by mouth.     ferrous sulfate 325 (65 FE) MG tablet Take 325 mg by mouth daily with breakfast.     leuprolide, 6 Month, (ELIGARD) 45 MG injection Inject 45 mg into the skin every 6 (six) months.      predniSONE (DELTASONE) 10 MG tablet Take 10 mg by mouth daily.     valsartan (DIOVAN) 160 MG tablet Take 1 tablet (160 mg total) by mouth daily. 90 tablet 3   No facility-administered medications prior to visit.    No Known Allergies      Objective:     Physical Exam Vitals and nursing note reviewed.  Constitutional:      General: She is not in acute distress.    Appearance: Normal appearance.  HENT:     Head: Normocephalic.  Pulmonary:     Effort: No respiratory distress.  Musculoskeletal:     Cervical back: Normal range of motion.  Skin:    General: Skin is dry.     Coloration: Skin is not pale.  Neurological:     Mental Status: She is alert and oriented to person, place, and time.  Psychiatric:        Mood and Affect: Mood normal.   Ht 6' (1.829 m)    Wt 240 lb 4.8 oz (109 kg)    BMI 32.59 kg/m   Wt Readings from Last 3 Encounters:  07/11/21 240 lb 4.8 oz (109 kg)  05/17/21 240 lb 3.2 oz (109 kg)  05/14/21 243 lb 3.2 oz (110.3 kg)       Assessment & Plan:   Problem List Items Addressed This Visit       Other   COVID-19 - Primary    Sending Paxlovid, per pt preference, reports he had his GFR checked at [facility] on 12/22 and it was 64, willing to stop his Lipitor while taking the Paxlovid. pt advised of FDA label for emergency use, how to take, & SE. Advised of CDC guidelines for self isolation/ ending isolation.  Advised of safe practice guidelines. Symptom Tier reviewed.  Encouraged to monitor for any worsening symptoms; watch for increased shortness of breath, weakness, and signs of dehydration. Instructed to rest and hydrate well.  Advised to wear a mask if necessary to leave the house.       Relevant Medications   nirmatrelvir/ritonavir EUA (PAXLOVID) 20 x 150 MG & 10 x 100MG  TABS    Meds ordered this encounter  Medications   nirmatrelvir/ritonavir EUA (PAXLOVID) 20 x 150 MG & 10 x 100MG  TABS    Sig: Take 3 tablets by mouth 2 (two) times daily  for 5 days. (Take nirmatrelvir 150 mg two tablets twice daily for 5 days and ritonavir 100 mg one tablet twice daily for 5 days) Patient GFR is 64    Dispense:  30 tablet    Refill:  0    pt will hold his Lipitor while taking medication    Order Specific Question:   Supervising Provider    Answer:   ANDY, CAMILLE L [[id number]]    I discussed the assessment and treatment plan with the patient. The patient was provided an opportunity to ask questions and all were answered. The  patient agreed with the plan and demonstrated an understanding of the instructions.   The patient was advised to call back or seek an in-person evaluation if the symptoms worsen or if the condition fails to improve as anticipated.  I provided 22 minutes of face-to-face time during this encounter.   Jeanie Sewer, NP [place] [phone] (phone) [phone] (fax)  [place]

## 2021-07-11 NOTE — Assessment & Plan Note (Addendum)
Sending Paxlovid, per pt preference, reports he had his GFR checked at [facility] on 12/22 and it was 64, willing to stop his Lipitor while taking the Paxlovid. pt advised of FDA label for emergency use, how to take, & SE. Advised of CDC guidelines for self isolation/ ending isolation.  Advised of safe practice guidelines. Symptom Tier reviewed.  Encouraged to monitor for any worsening symptoms; watch for increased shortness of breath, weakness, and signs of dehydration. Instructed to rest and hydrate well.  Advised to wear a mask if necessary to leave the house.

## 2021-07-27 ENCOUNTER — Other Ambulatory Visit: Payer: Self-pay | Admitting: Internal Medicine

## 2021-07-29 ENCOUNTER — Telehealth: Payer: Self-pay

## 2021-07-29 NOTE — Telephone Encounter (Signed)
Yes thanks-I am fine with [place] doing these and sending back to [facility] but I also have not seen the orders yet-they may need to be resent or a copy brought by our office if issues

## 2021-07-29 NOTE — Telephone Encounter (Signed)
States he is currently getting chemo at [facility].  States [facility] was to have sent an order for labs to our office.    Would like to know if Dr. Yong Channel will be able to assist with having labs completed at our office.  Please follow up with patient in regard.

## 2021-07-29 NOTE — Telephone Encounter (Signed)
Is this ok? I haven't seen any orders come across yet though.

## 2021-08-01 ENCOUNTER — Other Ambulatory Visit: Payer: Self-pay

## 2021-08-01 ENCOUNTER — Other Ambulatory Visit (INDEPENDENT_AMBULATORY_CARE_PROVIDER_SITE_OTHER): Payer: Medicare PPO

## 2021-08-01 ENCOUNTER — Telehealth: Payer: Self-pay

## 2021-08-01 DIAGNOSIS — C61 Malignant neoplasm of prostate: Secondary | ICD-10-CM

## 2021-08-01 LAB — CBC WITH DIFFERENTIAL/PLATELET
Basophils Absolute: 0 10*3/uL (ref 0.0–0.1)
Basophils Relative: 0.5 % (ref 0.0–3.0)
Eosinophils Absolute: 0 10*3/uL (ref 0.0–0.7)
Eosinophils Relative: 0.9 % (ref 0.0–5.0)
HCT: 32.4 % — ABNORMAL LOW (ref 39.0–52.0)
Hemoglobin: 10.9 g/dL — ABNORMAL LOW (ref 13.0–17.0)
Lymphocytes Relative: 9.4 % — ABNORMAL LOW (ref 12.0–46.0)
Lymphs Abs: 0.5 10*3/uL — ABNORMAL LOW (ref 0.7–4.0)
MCHC: 33.4 g/dL (ref 30.0–36.0)
MCV: 87.8 fl (ref 78.0–100.0)
Monocytes Absolute: 0.4 10*3/uL (ref 0.1–1.0)
Monocytes Relative: 7.5 % (ref 3.0–12.0)
Neutro Abs: 4.3 10*3/uL (ref 1.4–7.7)
Neutrophils Relative %: 81.7 % — ABNORMAL HIGH (ref 43.0–77.0)
Platelets: 172 10*3/uL (ref 150.0–400.0)
RBC: 3.69 Mil/uL — ABNORMAL LOW (ref 4.22–5.81)
RDW: 17.5 % — ABNORMAL HIGH (ref 11.5–15.5)
WBC: 5.2 10*3/uL (ref 4.0–10.5)

## 2021-08-01 LAB — COMPREHENSIVE METABOLIC PANEL
ALT: 13 U/L (ref 0–53)
AST: 12 U/L (ref 0–37)
Albumin: 4 g/dL (ref 3.5–5.2)
Alkaline Phosphatase: 44 U/L (ref 39–117)
BUN: 23 mg/dL (ref 6–23)
CO2: 25 mEq/L (ref 19–32)
Calcium: 8.4 mg/dL (ref 8.4–10.5)
Chloride: 108 mEq/L (ref 96–112)
Creatinine, Ser: 1.18 mg/dL (ref 0.40–1.50)
GFR: 61.48 mL/min (ref >60.00)
Glucose, Bld: 92 mg/dL (ref 70–99)
Potassium: 4.4 mEq/L (ref 3.5–5.1)
Sodium: 140 mEq/L (ref 135–145)
Total Bilirubin: 0.4 mg/dL (ref 0.2–1.2)
Total Protein: 6 g/dL (ref 6.0–8.3)

## 2021-08-01 LAB — PSA: PSA: 65.13 ng/mL — ABNORMAL HIGH (ref 0.10–4.00)

## 2021-08-01 NOTE — Telephone Encounter (Signed)
Please call Erasmo Downer regarding coordination of lab work for Mr Betts. She work at the [employer].

## 2021-08-01 NOTE — Telephone Encounter (Signed)
Returned the call to [place] pt will need lab work in between Cancer Treatments consisting of CBC w/diff, CMP and PSA, she will be faxing over the orders for this today. She would like for pt to have a starting lab draw tomorrow of the CBC w/ diff, CMP and PSA she will make pt aware of lab visit at our office for tomorrow.

## 2021-08-02 ENCOUNTER — Other Ambulatory Visit: Payer: Medicare PPO

## 2021-08-08 DIAGNOSIS — R9721 Rising PSA following treatment for malignant neoplasm of prostate: Secondary | ICD-10-CM | POA: Diagnosis not present

## 2021-08-08 DIAGNOSIS — Z79818 Long term (current) use of other agents affecting estrogen receptors and estrogen levels: Secondary | ICD-10-CM | POA: Diagnosis not present

## 2021-08-08 DIAGNOSIS — C7951 Secondary malignant neoplasm of bone: Secondary | ICD-10-CM | POA: Diagnosis not present

## 2021-08-08 DIAGNOSIS — Z8042 Family history of malignant neoplasm of prostate: Secondary | ICD-10-CM | POA: Diagnosis not present

## 2021-08-08 DIAGNOSIS — Z79899 Other long term (current) drug therapy: Secondary | ICD-10-CM | POA: Diagnosis not present

## 2021-08-08 DIAGNOSIS — D630 Anemia in neoplastic disease: Secondary | ICD-10-CM | POA: Diagnosis not present

## 2021-08-08 DIAGNOSIS — R972 Elevated prostate specific antigen [PSA]: Secondary | ICD-10-CM | POA: Diagnosis not present

## 2021-08-08 DIAGNOSIS — C61 Malignant neoplasm of prostate: Secondary | ICD-10-CM | POA: Diagnosis not present

## 2021-08-12 ENCOUNTER — Other Ambulatory Visit: Payer: Self-pay

## 2021-08-12 DIAGNOSIS — C61 Malignant neoplasm of prostate: Secondary | ICD-10-CM

## 2021-08-14 ENCOUNTER — Other Ambulatory Visit (HOSPITAL_BASED_OUTPATIENT_CLINIC_OR_DEPARTMENT_OTHER): Payer: Self-pay

## 2021-08-15 ENCOUNTER — Other Ambulatory Visit (HOSPITAL_BASED_OUTPATIENT_CLINIC_OR_DEPARTMENT_OTHER): Payer: Self-pay

## 2021-08-29 ENCOUNTER — Other Ambulatory Visit: Payer: Self-pay

## 2021-08-29 ENCOUNTER — Other Ambulatory Visit (INDEPENDENT_AMBULATORY_CARE_PROVIDER_SITE_OTHER): Payer: Medicare PPO

## 2021-08-29 DIAGNOSIS — C61 Malignant neoplasm of prostate: Secondary | ICD-10-CM

## 2021-08-29 LAB — CBC WITH DIFFERENTIAL/PLATELET
Basophils Absolute: 0 10*3/uL (ref 0.0–0.1)
Basophils Relative: 0.5 % (ref 0.0–3.0)
Eosinophils Absolute: 0.1 10*3/uL (ref 0.0–0.7)
Eosinophils Relative: 1.8 % (ref 0.0–5.0)
HCT: 35.8 % — ABNORMAL LOW (ref 39.0–52.0)
Hemoglobin: 11.9 g/dL — ABNORMAL LOW (ref 13.0–17.0)
Lymphocytes Relative: 10.8 % — ABNORMAL LOW (ref 12.0–46.0)
Lymphs Abs: 0.5 10*3/uL — ABNORMAL LOW (ref 0.7–4.0)
MCHC: 33.1 g/dL (ref 30.0–36.0)
MCV: 89.1 fl (ref 78.0–100.0)
Monocytes Absolute: 0.5 10*3/uL (ref 0.1–1.0)
Monocytes Relative: 11.5 % (ref 3.0–12.0)
Neutro Abs: 3.4 10*3/uL (ref 1.4–7.7)
Neutrophils Relative %: 75.4 % (ref 43.0–77.0)
Platelets: 176 10*3/uL (ref 150.0–400.0)
RBC: 4.02 Mil/uL — ABNORMAL LOW (ref 4.22–5.81)
RDW: 16 % — ABNORMAL HIGH (ref 11.5–15.5)
WBC: 4.6 10*3/uL (ref 4.0–10.5)

## 2021-08-29 LAB — COMPREHENSIVE METABOLIC PANEL
ALT: 13 U/L (ref 0–53)
AST: 15 U/L (ref 0–37)
Albumin: 4.3 g/dL (ref 3.5–5.2)
Alkaline Phosphatase: 50 U/L (ref 39–117)
BUN: 25 mg/dL — ABNORMAL HIGH (ref 6–23)
CO2: 28 mEq/L (ref 19–32)
Calcium: 8.6 mg/dL (ref 8.4–10.5)
Chloride: 104 mEq/L (ref 96–112)
Creatinine, Ser: 1.15 mg/dL (ref 0.40–1.50)
GFR: 63.38 mL/min (ref >60.00)
Glucose, Bld: 100 mg/dL — ABNORMAL HIGH (ref 70–99)
Potassium: 3.8 mEq/L (ref 3.5–5.1)
Sodium: 138 mEq/L (ref 135–145)
Total Bilirubin: 0.6 mg/dL (ref 0.2–1.2)
Total Protein: 6.4 g/dL (ref 6.0–8.3)

## 2021-08-29 LAB — PSA: PSA: 59.13 ng/mL — ABNORMAL HIGH (ref 0.10–4.00)

## 2021-09-10 ENCOUNTER — Ambulatory Visit (INDEPENDENT_AMBULATORY_CARE_PROVIDER_SITE_OTHER): Payer: Medicare PPO

## 2021-09-10 DIAGNOSIS — I442 Atrioventricular block, complete: Secondary | ICD-10-CM | POA: Diagnosis not present

## 2021-09-12 ENCOUNTER — Other Ambulatory Visit: Payer: Self-pay

## 2021-09-12 ENCOUNTER — Other Ambulatory Visit (INDEPENDENT_AMBULATORY_CARE_PROVIDER_SITE_OTHER): Payer: Medicare PPO

## 2021-09-12 DIAGNOSIS — C61 Malignant neoplasm of prostate: Secondary | ICD-10-CM | POA: Diagnosis not present

## 2021-09-12 LAB — COMPREHENSIVE METABOLIC PANEL
ALT: 17 U/L (ref 0–53)
AST: 16 U/L (ref 0–37)
Albumin: 3.9 g/dL (ref 3.5–5.2)
Alkaline Phosphatase: 52 U/L (ref 39–117)
BUN: 24 mg/dL — ABNORMAL HIGH (ref 6–23)
CO2: 26 mEq/L (ref 19–32)
Calcium: 8.8 mg/dL (ref 8.4–10.5)
Chloride: 103 mEq/L (ref 96–112)
Creatinine, Ser: 1.09 mg/dL (ref 0.40–1.50)
GFR: 67.57 mL/min (ref >60.00)
Glucose, Bld: 135 mg/dL — ABNORMAL HIGH (ref 70–99)
Potassium: 4.2 mEq/L (ref 3.5–5.1)
Sodium: 137 mEq/L (ref 135–145)
Total Bilirubin: 0.5 mg/dL (ref 0.2–1.2)
Total Protein: 6 g/dL (ref 6.0–8.3)

## 2021-09-12 LAB — CBC WITH DIFFERENTIAL/PLATELET
Basophils Absolute: 0 10*3/uL (ref 0.0–0.1)
Basophils Relative: 0.3 % (ref 0.0–3.0)
Eosinophils Absolute: 0.1 10*3/uL (ref 0.0–0.7)
Eosinophils Relative: 1.8 % (ref 0.0–5.0)
HCT: 34.3 % — ABNORMAL LOW (ref 39.0–52.0)
Hemoglobin: 11.7 g/dL — ABNORMAL LOW (ref 13.0–17.0)
Lymphocytes Relative: 13.6 % (ref 12.0–46.0)
Lymphs Abs: 0.6 10*3/uL — ABNORMAL LOW (ref 0.7–4.0)
MCHC: 34 g/dL (ref 30.0–36.0)
MCV: 89.1 fl (ref 78.0–100.0)
Monocytes Absolute: 0.4 10*3/uL (ref 0.1–1.0)
Monocytes Relative: 8.9 % (ref 3.0–12.0)
Neutro Abs: 3.4 10*3/uL (ref 1.4–7.7)
Neutrophils Relative %: 75.4 % (ref 43.0–77.0)
Platelets: 166 10*3/uL (ref 150.0–400.0)
RBC: 3.85 Mil/uL — ABNORMAL LOW (ref 4.22–5.81)
RDW: 14.9 % (ref 11.5–15.5)
WBC: 4.5 10*3/uL (ref 4.0–10.5)

## 2021-09-12 LAB — PSA: PSA: 35.52 ng/mL — ABNORMAL HIGH (ref 0.10–4.00)

## 2021-09-13 ENCOUNTER — Telehealth: Payer: Self-pay

## 2021-09-13 LAB — CUP PACEART REMOTE DEVICE CHECK
Battery Impedance: 1963 Ohm
Battery Remaining Longevity: 30 mo
Battery Voltage: 2.74 V
Brady Statistic AP VP Percent: 2 %
Brady Statistic AP VS Percent: 0 %
Brady Statistic AS VP Percent: 98 %
Brady Statistic AS VS Percent: 0 %
Date Time Interrogation Session: 20230303100542
Implantable Lead Implant Date: 20000731
Implantable Lead Implant Date: 20000731
Implantable Lead Location: 753859
Implantable Lead Location: 753860
Implantable Lead Model: 5092
Implantable Pulse Generator Implant Date: 20121026
Lead Channel Impedance Value: 351 Ohm
Lead Channel Impedance Value: 535 Ohm
Lead Channel Pacing Threshold Amplitude: 0.875 V
Lead Channel Pacing Threshold Amplitude: 1.5 V
Lead Channel Pacing Threshold Pulse Width: 0.4 ms
Lead Channel Pacing Threshold Pulse Width: 0.4 ms
Lead Channel Setting Pacing Amplitude: 2 V
Lead Channel Setting Pacing Amplitude: 3 V
Lead Channel Setting Pacing Pulse Width: 0.4 ms
Lead Channel Setting Sensing Sensitivity: 2.8 mV

## 2021-09-13 NOTE — Telephone Encounter (Signed)
Labs have been forwarded to [facility]. ?

## 2021-09-18 NOTE — Progress Notes (Signed)
Remote pacemaker transmission.   

## 2021-09-20 DIAGNOSIS — Z5112 Encounter for antineoplastic immunotherapy: Secondary | ICD-10-CM | POA: Diagnosis not present

## 2021-09-20 DIAGNOSIS — I10 Essential (primary) hypertension: Secondary | ICD-10-CM | POA: Diagnosis not present

## 2021-09-20 DIAGNOSIS — D630 Anemia in neoplastic disease: Secondary | ICD-10-CM | POA: Diagnosis not present

## 2021-09-20 DIAGNOSIS — C61 Malignant neoplasm of prostate: Secondary | ICD-10-CM | POA: Diagnosis not present

## 2021-09-20 DIAGNOSIS — C7951 Secondary malignant neoplasm of bone: Secondary | ICD-10-CM | POA: Diagnosis not present

## 2021-09-20 DIAGNOSIS — G629 Polyneuropathy, unspecified: Secondary | ICD-10-CM | POA: Diagnosis not present

## 2021-10-10 ENCOUNTER — Telehealth: Payer: Self-pay

## 2021-10-10 ENCOUNTER — Other Ambulatory Visit: Payer: Self-pay

## 2021-10-10 ENCOUNTER — Other Ambulatory Visit (HOSPITAL_BASED_OUTPATIENT_CLINIC_OR_DEPARTMENT_OTHER): Payer: Self-pay

## 2021-10-10 ENCOUNTER — Other Ambulatory Visit (INDEPENDENT_AMBULATORY_CARE_PROVIDER_SITE_OTHER): Payer: Medicare PPO

## 2021-10-10 DIAGNOSIS — C61 Malignant neoplasm of prostate: Secondary | ICD-10-CM

## 2021-10-10 LAB — PSA: PSA: 18.27 ng/mL — ABNORMAL HIGH (ref 0.10–4.00)

## 2021-10-10 LAB — CBC WITH DIFFERENTIAL/PLATELET
Basophils Absolute: 0 10*3/uL (ref 0.0–0.1)
Basophils Relative: 0.6 % (ref 0.0–3.0)
Eosinophils Absolute: 0.1 10*3/uL (ref 0.0–0.7)
Eosinophils Relative: 2.8 % (ref 0.0–5.0)
HCT: 33.9 % — ABNORMAL LOW (ref 39.0–52.0)
Hemoglobin: 11.4 g/dL — ABNORMAL LOW (ref 13.0–17.0)
Lymphocytes Relative: 15 % (ref 12.0–46.0)
Lymphs Abs: 0.6 10*3/uL — ABNORMAL LOW (ref 0.7–4.0)
MCHC: 33.7 g/dL (ref 30.0–36.0)
MCV: 89.2 fl (ref 78.0–100.0)
Monocytes Absolute: 0.5 10*3/uL (ref 0.1–1.0)
Monocytes Relative: 11.4 % (ref 3.0–12.0)
Neutro Abs: 2.9 10*3/uL (ref 1.4–7.7)
Neutrophils Relative %: 70.2 % (ref 43.0–77.0)
Platelets: 171 10*3/uL (ref 150.0–400.0)
RBC: 3.8 Mil/uL — ABNORMAL LOW (ref 4.22–5.81)
RDW: 14 % (ref 11.5–15.5)
WBC: 4.1 10*3/uL (ref 4.0–10.5)

## 2021-10-10 LAB — COMPREHENSIVE METABOLIC PANEL
ALT: 15 U/L (ref 0–53)
AST: 15 U/L (ref 0–37)
Albumin: 4.1 g/dL (ref 3.5–5.2)
Alkaline Phosphatase: 52 U/L (ref 39–117)
BUN: 21 mg/dL (ref 6–23)
CO2: 27 mEq/L (ref 19–32)
Calcium: 9 mg/dL (ref 8.4–10.5)
Chloride: 103 mEq/L (ref 96–112)
Creatinine, Ser: 1.21 mg/dL (ref 0.40–1.50)
GFR: 59.58 mL/min — ABNORMAL LOW (ref >60.00)
Glucose, Bld: 88 mg/dL (ref 70–99)
Potassium: 4 mEq/L (ref 3.5–5.1)
Sodium: 140 mEq/L (ref 135–145)
Total Bilirubin: 0.6 mg/dL (ref 0.2–1.2)
Total Protein: 6.1 g/dL (ref 6.0–8.3)

## 2021-10-10 MED ORDER — VALSARTAN 160 MG PO TABS
160.0000 mg | ORAL_TABLET | Freq: Every day | ORAL | 3 refills | Status: AC
Start: 2021-10-10 — End: ?
  Filled 2021-10-10: qty 90, 90d supply, fill #0
  Filled 2022-05-14: qty 90, 90d supply, fill #1

## 2021-10-10 NOTE — Telephone Encounter (Signed)
Refill sent to pharmacy.   

## 2021-10-10 NOTE — Telephone Encounter (Signed)
MEDICATION: valsartan (DIOVAN) 160 MG tablet ? ?PHARMACY:  ?[place] at [facility] Phone:  [phone]  ?Fax:  [phone]  ?  ? ? ?Comments:  ? ?**Let patient know to contact pharmacy at the end of the day to make sure medication is ready. ** ? ?** Please notify patient to allow 48-72 hours to process** ? ?**Encourage patient to contact the pharmacy for refills or they can request refills through [facility]** ? ?

## 2021-10-10 NOTE — Telephone Encounter (Signed)
Yes, this is correct. Orders are in, ok to schedule pt for labs.  ?

## 2021-10-10 NOTE — Telephone Encounter (Signed)
Patient state he has orders that need to be drawn and sent to [facility].  States the orders should be for CBC, Dif and PSA.   Please follow up with patient in regard.

## 2021-10-11 ENCOUNTER — Telehealth: Payer: Self-pay | Admitting: *Deleted

## 2021-10-11 ENCOUNTER — Other Ambulatory Visit (HOSPITAL_BASED_OUTPATIENT_CLINIC_OR_DEPARTMENT_OTHER): Payer: Self-pay

## 2021-10-11 DIAGNOSIS — S42212A Unspecified displaced fracture of surgical neck of left humerus, initial encounter for closed fracture: Secondary | ICD-10-CM | POA: Diagnosis not present

## 2021-10-11 DIAGNOSIS — M25512 Pain in left shoulder: Secondary | ICD-10-CM | POA: Diagnosis not present

## 2021-10-11 MED ORDER — OXYCODONE-ACETAMINOPHEN 5-325 MG PO TABS
ORAL_TABLET | ORAL | 0 refills | Status: DC
  Filled 2021-10-11: qty 20, 3d supply, fill #0

## 2021-10-11 MED ORDER — CYCLOBENZAPRINE HCL 10 MG PO TABS
ORAL_TABLET | ORAL | 0 refills | Status: DC
  Filled 2021-10-11: qty 30, 7d supply, fill #0

## 2021-10-11 NOTE — Telephone Encounter (Signed)
? ?  Pre-operative Risk Assessment  ?  ?Patient Name: Devin Mercado  ?DOB: March 05, 1951 ?MRN: [id number]  ? ?  ? ?Request for Surgical Clearance   ? ?Procedure:   LEFT REVERSE SHOULDER ARTHROPLASTY FOR FRACTURE  ? ?Date of Surgery:  Clearance 10/15/21                              ?   ?Surgeon:  DR. Lennette Bihari SUPPLE ?Surgeon's Group or Practice Name:  [facility] ?Phone number:  [phone] ?Fax number:  [phone] ATTN: [name] ?  ?Type of Clearance Requested:   ?- Medical  ?  ?Type of Anesthesia:  General  ?  ?Additional requests/questions:   ? ?Signed, ?Julaine Hua   ?10/11/2021, 4:42 PM  ? ?

## 2021-10-11 NOTE — Telephone Encounter (Signed)
? ?  Primary Cardiologist: Cristopher Peru, MD ? ?Chart reviewed as part of pre-operative protocol coverage. Given past medical history and time since last visit, based on ACC/AHA guidelines, Devin Mercado would be at acceptable risk for the planned procedure without further cardiovascular testing.  ? ?Patient was advised that if he develops new symptoms prior to surgery to contact our office to arrange a follow-up appointment.  He verbalized understanding. ? ?His RCRI is a class I risk, 0.4% risk of major cardiac event.  Prior to his shoulder injury he was able to complete greater than 4 METS of physical activity. ? ?I will route this recommendation to the requesting party via Epic fax function and remove from pre-op pool. ? ?Please call with questions. ? ?Devin Mercado. Jeancarlo Leffler NP-C ? ?  ?10/11/2021, 4:50 PM ?[place] ?[address] ?Office [phone] Fax [phone] ? ? ? ? ?

## 2021-10-11 NOTE — Telephone Encounter (Signed)
Patient has completed.

## 2021-10-14 ENCOUNTER — Encounter: Payer: Self-pay | Admitting: Internal Medicine

## 2021-10-14 ENCOUNTER — Encounter (HOSPITAL_COMMUNITY): Payer: Self-pay | Admitting: Orthopedic Surgery

## 2021-10-14 ENCOUNTER — Other Ambulatory Visit: Payer: Self-pay

## 2021-10-14 NOTE — Progress Notes (Addendum)
COVID swab appointment:  N/A ? ?COVID Vaccine Completed:  Yes x2 ?Date COVID Vaccine completed:  07-22-19 08-22-19 ?Has received booster:  Yes x1 09-27-20 ?COVID vaccine manufacturer:     Moderna    ? ?Date of COVID positive in last 90 days:  No ? ?PCP - Garret Reddish, MD ?Cardiologist - Crissie Sickles, MD ? ?Cardiac clearance in Epic dated 10-11-21 by Coletta Memos, NP-C ? ?Chest x-ray - CT chest 04-11-21 [facility] ?EKG - 05-14-21 Epic ?Stress Test - greater than 2 years ?ECHO - greater than 2 years ?Cardiac Cath -  ?Pacemaker/ICD device last checked:  09-13-21 Epic. Device orders requested 10-14-21 ?Spinal Cord Stimulator: ? ?Bowel Prep - N/A ? ?Sleep Study - N/A ?CPAP -  ? ?Fasting Blood Sugar - N/A ?Checks Blood Sugar _____ times a day ? ?Blood Thinner Instructions:  N/A ?Aspirin Instructions: ?Last Dose: ? ?Activity level:  Prior to shoulder injury able to  go up a flight of stairs and perform activities of daily living without stopping and without symptoms of chest pain or shortness of breath.  Able to exercise without symptoms prior to shoulder injury ? ?Anesthesia review:  Complete heart block, pacemaker, HTN.  Prostate cancer with metastasis and currently undergoing treatment (Pluvicto given 09-20-21) ? ?Patient denies shortness of breath, fever, cough and chest pain at PAT appointment (completed over the phone) ? ?Patient verbalized understanding of instructions that were given to them at the PAT appointment. Patient was also instructed that they will need to review over the PAT instructions again at home before surgery.  ?

## 2021-10-14 NOTE — Progress Notes (Signed)
PERIOPERATIVE PRESCRIPTION FOR IMPLANTED CARDIAC DEVICE PROGRAMMING ? ?Patient Information: ?Name:  Devin Mercado  ?DOB:  06/08/1951  ?MRN:  [id number]  ?  ?Planned Procedure:  L Total Shoulder Arthroplasty  ?Surgeon:  Dr. Justice Britain  ?Date of Procedure:  10-15-21  ?Cautery will be used.  ?Position during surgery:  Supine  ? ?Please send documentation back to:  ?[facility] (Fax # [phone])  ? ?Lambert Keto, RN  ?10/14/2021 8:25 AM  ?   ?Device Information: ? ?Clinic EP Physician:  Cristopher Peru, MD  ? ?Device Type:  Pacemaker ?Manufacturer and Phone #:  Medtronic: [phone] ?Pacemaker Dependent?:  Yes.   ?Date of Last Device Check:  09/13/21 Normal Device Function?:  Yes.   ? ?Electrophysiologist's Recommendations: ? ?Have magnet available. ?Provide continuous ECG monitoring when magnet is used or reprogramming is to be performed.  ?Procedure will likely interfere with device function.  Device should be programmed:  Asynchronous pacing during procedure and returned to normal programming after procedure ? ?Per Device Clinic Standing Orders, ?Wanda Plump, RN  ?9:20 AM 10/14/2021  ?

## 2021-10-15 ENCOUNTER — Ambulatory Visit (HOSPITAL_COMMUNITY)
Admission: RE | Admit: 2021-10-15 | Discharge: 2021-10-16 | Disposition: A | Discharge status: Home / Self Care | Payer: Medicare PPO | Attending: Orthopedic Surgery | Admitting: Orthopedic Surgery

## 2021-10-15 ENCOUNTER — Ambulatory Visit (HOSPITAL_COMMUNITY): Payer: Medicare PPO | Admitting: Physician Assistant

## 2021-10-15 ENCOUNTER — Encounter (HOSPITAL_COMMUNITY): Payer: Self-pay | Admitting: Orthopedic Surgery

## 2021-10-15 ENCOUNTER — Other Ambulatory Visit: Payer: Self-pay

## 2021-10-15 ENCOUNTER — Encounter (HOSPITAL_COMMUNITY)
Admission: RE | Disposition: A | Discharge status: Home / Self Care | Payer: Self-pay | Source: Home / Self Care | Attending: Orthopedic Surgery

## 2021-10-15 ENCOUNTER — Ambulatory Visit (HOSPITAL_BASED_OUTPATIENT_CLINIC_OR_DEPARTMENT_OTHER): Payer: Medicare PPO | Admitting: Physician Assistant

## 2021-10-15 DIAGNOSIS — D649 Anemia, unspecified: Secondary | ICD-10-CM

## 2021-10-15 DIAGNOSIS — I10 Essential (primary) hypertension: Secondary | ICD-10-CM

## 2021-10-15 DIAGNOSIS — Y939 Activity, unspecified: Secondary | ICD-10-CM | POA: Diagnosis not present

## 2021-10-15 DIAGNOSIS — Z8546 Personal history of malignant neoplasm of prostate: Secondary | ICD-10-CM | POA: Diagnosis not present

## 2021-10-15 DIAGNOSIS — S42242A 4-part fracture of surgical neck of left humerus, initial encounter for closed fracture: Secondary | ICD-10-CM | POA: Diagnosis not present

## 2021-10-15 DIAGNOSIS — E785 Hyperlipidemia, unspecified: Secondary | ICD-10-CM

## 2021-10-15 DIAGNOSIS — Z950 Presence of cardiac pacemaker: Secondary | ICD-10-CM | POA: Insufficient documentation

## 2021-10-15 DIAGNOSIS — R0902 Hypoxemia: Secondary | ICD-10-CM | POA: Diagnosis not present

## 2021-10-15 DIAGNOSIS — W19XXXA Unspecified fall, initial encounter: Secondary | ICD-10-CM | POA: Insufficient documentation

## 2021-10-15 DIAGNOSIS — G8918 Other acute postprocedural pain: Secondary | ICD-10-CM | POA: Diagnosis not present

## 2021-10-15 DIAGNOSIS — S42292A Other displaced fracture of upper end of left humerus, initial encounter for closed fracture: Secondary | ICD-10-CM | POA: Diagnosis not present

## 2021-10-15 DIAGNOSIS — M19012 Primary osteoarthritis, left shoulder: Secondary | ICD-10-CM | POA: Diagnosis not present

## 2021-10-15 DIAGNOSIS — Z96612 Presence of left artificial shoulder joint: Secondary | ICD-10-CM

## 2021-10-15 DIAGNOSIS — S42202A Unspecified fracture of upper end of left humerus, initial encounter for closed fracture: Secondary | ICD-10-CM | POA: Diagnosis not present

## 2021-10-15 DIAGNOSIS — Z6833 Body mass index (BMI) 33.0-33.9, adult: Secondary | ICD-10-CM | POA: Insufficient documentation

## 2021-10-15 DIAGNOSIS — Z01818 Encounter for other preprocedural examination: Secondary | ICD-10-CM

## 2021-10-15 DIAGNOSIS — I442 Atrioventricular block, complete: Secondary | ICD-10-CM | POA: Diagnosis not present

## 2021-10-15 DIAGNOSIS — E669 Obesity, unspecified: Secondary | ICD-10-CM | POA: Diagnosis not present

## 2021-10-15 HISTORY — PX: REVERSE SHOULDER ARTHROPLASTY: SHX5054

## 2021-10-15 LAB — SURGICAL PCR SCREEN
MRSA, PCR: NEGATIVE
Staphylococcus aureus: NEGATIVE

## 2021-10-15 SURGERY — ARTHROPLASTY, SHOULDER, TOTAL, REVERSE
Anesthesia: General | Site: Shoulder | Laterality: Left

## 2021-10-15 MED ORDER — FENTANYL CITRATE (PF) 100 MCG/2ML IJ SOLN
INTRAMUSCULAR | Status: AC
  Filled 2021-10-15: qty 2

## 2021-10-15 MED ORDER — ALBUMIN HUMAN 5 % IV SOLN: INTRAVENOUS | Status: DC | PRN

## 2021-10-15 MED ORDER — LACTATED RINGERS IV SOLN: INTRAVENOUS | Status: DC

## 2021-10-15 MED ORDER — BISACODYL 5 MG PO TBEC: 5.0000 mg | DELAYED_RELEASE_TABLET | Freq: Every day | ORAL | Status: DC | PRN

## 2021-10-15 MED ORDER — LIDOCAINE 2% (20 MG/ML) 5 ML SYRINGE
INTRAMUSCULAR | Status: DC | PRN
  Administered 2021-10-15: 40 mg via INTRAVENOUS

## 2021-10-15 MED ORDER — BUPIVACAINE LIPOSOME 1.3 % IJ SUSP
INTRAMUSCULAR | Status: DC | PRN
  Administered 2021-10-15: 10 mL via PERINEURAL

## 2021-10-15 MED ORDER — CEFAZOLIN SODIUM-DEXTROSE 2-4 GM/100ML-% IV SOLN
2.0000 g | INTRAVENOUS | Status: AC
  Administered 2021-10-15: 2 g via INTRAVENOUS
  Filled 2021-10-15: qty 100

## 2021-10-15 MED ORDER — AMISULPRIDE (ANTIEMETIC) 5 MG/2ML IV SOLN: 10.0000 mg | Freq: Once | INTRAVENOUS | Status: DC | PRN

## 2021-10-15 MED ORDER — MIDAZOLAM HCL 2 MG/2ML IJ SOLN
1.0000 mg | INTRAMUSCULAR | Status: DC
  Administered 2021-10-15: 2 mg via INTRAVENOUS
  Filled 2021-10-15: qty 2

## 2021-10-15 MED ORDER — ROCURONIUM BROMIDE 10 MG/ML (PF) SYRINGE
PREFILLED_SYRINGE | INTRAVENOUS | Status: DC | PRN
  Administered 2021-10-15: 100 mg via INTRAVENOUS
  Administered 2021-10-15: 10 mg via INTRAVENOUS

## 2021-10-15 MED ORDER — VANCOMYCIN HCL 1000 MG IV SOLR
INTRAVENOUS | Status: AC
  Filled 2021-10-15: qty 20

## 2021-10-15 MED ORDER — DIPHENHYDRAMINE HCL 12.5 MG/5ML PO ELIX: 12.5000 mg | ORAL_SOLUTION | ORAL | Status: DC | PRN

## 2021-10-15 MED ORDER — ONDANSETRON HCL 4 MG/2ML IJ SOLN
INTRAMUSCULAR | Status: DC | PRN
  Administered 2021-10-15: 4 mg via INTRAVENOUS

## 2021-10-15 MED ORDER — PHENYLEPHRINE HCL (PRESSORS) 10 MG/ML IV SOLN
INTRAVENOUS | Status: AC
  Filled 2021-10-15: qty 1

## 2021-10-15 MED ORDER — OXYCODONE HCL 5 MG PO TABS: 10.0000 mg | ORAL_TABLET | ORAL | Status: DC | PRN

## 2021-10-15 MED ORDER — ATORVASTATIN CALCIUM 20 MG PO TABS
20.0000 mg | ORAL_TABLET | Freq: Every day | ORAL | Status: DC
  Administered 2021-10-16: 20 mg via ORAL
  Filled 2021-10-15: qty 1

## 2021-10-15 MED ORDER — PROPOFOL 10 MG/ML IV BOLUS
INTRAVENOUS | Status: DC | PRN
  Administered 2021-10-15: 130 mg via INTRAVENOUS

## 2021-10-15 MED ORDER — POLYETHYLENE GLYCOL 3350 17 G PO PACK: 17.0000 g | PACK | Freq: Every day | ORAL | Status: DC | PRN

## 2021-10-15 MED ORDER — ONDANSETRON HCL 4 MG/2ML IJ SOLN: 4.0000 mg | Freq: Once | INTRAMUSCULAR | Status: DC | PRN

## 2021-10-15 MED ORDER — PROPOFOL 10 MG/ML IV BOLUS
INTRAVENOUS | Status: AC
  Filled 2021-10-15: qty 20

## 2021-10-15 MED ORDER — BUPIVACAINE HCL (PF) 0.5 % IJ SOLN
INTRAMUSCULAR | Status: DC | PRN
  Administered 2021-10-15: 20 mL via PERINEURAL

## 2021-10-15 MED ORDER — PHENYLEPHRINE 40 MCG/ML (10ML) SYRINGE FOR IV PUSH (FOR BLOOD PRESSURE SUPPORT)
PREFILLED_SYRINGE | INTRAVENOUS | Status: AC
  Filled 2021-10-15: qty 30

## 2021-10-15 MED ORDER — HYDROMORPHONE HCL 1 MG/ML IJ SOLN: 0.5000 mg | INTRAMUSCULAR | Status: DC | PRN

## 2021-10-15 MED ORDER — METHOCARBAMOL 1000 MG/10ML IJ SOLN
500.0000 mg | Freq: Four times a day (QID) | INTRAVENOUS | Status: DC | PRN
  Filled 2021-10-15: qty 5

## 2021-10-15 MED ORDER — DEXAMETHASONE SODIUM PHOSPHATE 10 MG/ML IJ SOLN
INTRAMUSCULAR | Status: DC | PRN
  Administered 2021-10-15: 4 mg via INTRAVENOUS

## 2021-10-15 MED ORDER — FENTANYL CITRATE (PF) 100 MCG/2ML IJ SOLN
INTRAMUSCULAR | Status: DC | PRN
  Administered 2021-10-15 (×2): 50 ug via INTRAVENOUS

## 2021-10-15 MED ORDER — ONDANSETRON HCL 4 MG PO TABS: 4.0000 mg | ORAL_TABLET | Freq: Four times a day (QID) | ORAL | Status: DC | PRN

## 2021-10-15 MED ORDER — METOCLOPRAMIDE HCL 5 MG/ML IJ SOLN: 5.0000 mg | Freq: Three times a day (TID) | INTRAMUSCULAR | Status: DC | PRN

## 2021-10-15 MED ORDER — DOCUSATE SODIUM 100 MG PO CAPS
100.0000 mg | ORAL_CAPSULE | Freq: Two times a day (BID) | ORAL | Status: DC
  Administered 2021-10-16: 100 mg via ORAL
  Filled 2021-10-15: qty 1

## 2021-10-15 MED ORDER — FENTANYL CITRATE PF 50 MCG/ML IJ SOSY
50.0000 ug | PREFILLED_SYRINGE | INTRAMUSCULAR | Status: DC
  Administered 2021-10-15: 50 ug via INTRAVENOUS
  Filled 2021-10-15: qty 2

## 2021-10-15 MED ORDER — CHLORHEXIDINE GLUCONATE 0.12 % MT SOLN: 15.0000 mL | Freq: Once | OROMUCOSAL | Status: AC

## 2021-10-15 MED ORDER — STERILE WATER FOR IRRIGATION IR SOLN
Status: DC | PRN
  Administered 2021-10-15: 2000 mL

## 2021-10-15 MED ORDER — SUGAMMADEX SODIUM 200 MG/2ML IV SOLN
INTRAVENOUS | Status: DC | PRN
  Administered 2021-10-15: 250 mg via INTRAVENOUS

## 2021-10-15 MED ORDER — TRANEXAMIC ACID-NACL 1000-0.7 MG/100ML-% IV SOLN
1000.0000 mg | INTRAVENOUS | Status: DC
  Filled 2021-10-15: qty 100

## 2021-10-15 MED ORDER — ACETAMINOPHEN 325 MG PO TABS
325.0000 mg | ORAL_TABLET | Freq: Four times a day (QID) | ORAL | Status: DC | PRN
  Administered 2021-10-16: 650 mg via ORAL
  Filled 2021-10-15: qty 2

## 2021-10-15 MED ORDER — METOCLOPRAMIDE HCL 5 MG PO TABS: 5.0000 mg | ORAL_TABLET | Freq: Three times a day (TID) | ORAL | Status: DC | PRN

## 2021-10-15 MED ORDER — MENTHOL 3 MG MT LOZG: 1.0000 | LOZENGE | OROMUCOSAL | Status: DC | PRN

## 2021-10-15 MED ORDER — PHENYLEPHRINE HCL-NACL 20-0.9 MG/250ML-% IV SOLN
INTRAVENOUS | Status: DC | PRN
Start: 2021-10-15 — End: 2021-10-15
  Administered 2021-10-15: 80 ug/min via INTRAVENOUS

## 2021-10-15 MED ORDER — ONDANSETRON HCL 4 MG/2ML IJ SOLN: 4.0000 mg | Freq: Four times a day (QID) | INTRAMUSCULAR | Status: DC | PRN

## 2021-10-15 MED ORDER — PHENYLEPHRINE 40 MCG/ML (10ML) SYRINGE FOR IV PUSH (FOR BLOOD PRESSURE SUPPORT)
PREFILLED_SYRINGE | INTRAVENOUS | Status: AC
  Filled 2021-10-15: qty 10

## 2021-10-15 MED ORDER — PHENOL 1.4 % MT LIQD: 1.0000 | OROMUCOSAL | Status: DC | PRN

## 2021-10-15 MED ORDER — OXYCODONE HCL 5 MG PO TABS: 5.0000 mg | ORAL_TABLET | ORAL | Status: DC | PRN

## 2021-10-15 MED ORDER — METHOCARBAMOL 500 MG PO TABS: 500.0000 mg | ORAL_TABLET | Freq: Four times a day (QID) | ORAL | Status: DC | PRN

## 2021-10-15 MED ORDER — ORAL CARE MOUTH RINSE
15.0000 mL | Freq: Once | OROMUCOSAL | Status: AC
  Administered 2021-10-15: 15 mL via OROMUCOSAL

## 2021-10-15 MED ORDER — VANCOMYCIN HCL 1000 MG IV SOLR
INTRAVENOUS | Status: DC | PRN
  Administered 2021-10-15: 1000 mg

## 2021-10-15 MED ORDER — FENTANYL CITRATE PF 50 MCG/ML IJ SOSY: 25.0000 ug | PREFILLED_SYRINGE | INTRAMUSCULAR | Status: DC | PRN

## 2021-10-15 MED ORDER — 0.9 % SODIUM CHLORIDE (POUR BTL) OPTIME
TOPICAL | Status: DC | PRN
  Administered 2021-10-15: 1000 mL

## 2021-10-15 MED ORDER — IRBESARTAN 150 MG PO TABS
150.0000 mg | ORAL_TABLET | Freq: Every day | ORAL | Status: DC
  Administered 2021-10-16: 150 mg via ORAL
  Filled 2021-10-15: qty 1

## 2021-10-15 SURGICAL SUPPLY — 72 items
BAG COUNTER SPONGE SURGICOUNT (BAG) ×1 IMPLANT
BAG ZIPLOCK 12X15 (MISCELLANEOUS) ×2 IMPLANT
BLADE SAW SGTL 83.5X18.5 (BLADE) ×2 IMPLANT
BNDG COHESIVE 4X5 TAN ST LF (GAUZE/BANDAGES/DRESSINGS) ×2 IMPLANT
COOLER ICEMAN CLASSIC (MISCELLANEOUS) ×2 IMPLANT
COVER BACK TABLE 60X90IN (DRAPES) ×2 IMPLANT
COVER SURGICAL LIGHT HANDLE (MISCELLANEOUS) ×2 IMPLANT
CUP SUT UNIV REVERS 39 NEU (Shoulder) ×1 IMPLANT
DERMABOND ADVANCED (GAUZE/BANDAGES/DRESSINGS) ×1
DERMABOND ADVANCED .7 DNX12 (GAUZE/BANDAGES/DRESSINGS) ×1 IMPLANT
DRAPE INCISE IOBAN 66X45 STRL (DRAPES) IMPLANT
DRAPE ORTHO SPLIT 77X108 STRL (DRAPES) ×4
DRAPE SHEET LG 3/4 BI-LAMINATE (DRAPES) ×2 IMPLANT
DRAPE SURG 17X11 SM STRL (DRAPES) ×2 IMPLANT
DRAPE SURG ORHT 6 SPLT 77X108 (DRAPES) ×2 IMPLANT
DRAPE TOP 10253 STERILE (DRAPES) ×2 IMPLANT
DRAPE U-SHAPE 47X51 STRL (DRAPES) ×2 IMPLANT
DRESSING AQUACEL AG SP 3.5X6 (GAUZE/BANDAGES/DRESSINGS) ×1 IMPLANT
DRSG AQUACEL AG ADV 3.5X10 (GAUZE/BANDAGES/DRESSINGS) IMPLANT
DRSG AQUACEL AG SP 3.5X6 (GAUZE/BANDAGES/DRESSINGS) ×2
DRSG TEGADERM 8X12 (GAUZE/BANDAGES/DRESSINGS) ×2 IMPLANT
DURAPREP 26ML APPLICATOR (WOUND CARE) ×2 IMPLANT
ELECT BLADE TIP CTD 4 INCH (ELECTRODE) ×2 IMPLANT
ELECT PENCIL ROCKER SW 15FT (MISCELLANEOUS) ×2 IMPLANT
ELECT REM PT RETURN 15FT ADLT (MISCELLANEOUS) ×2 IMPLANT
FACESHIELD WRAPAROUND (MASK) ×8 IMPLANT
FACESHIELD WRAPAROUND OR TEAM (MASK) ×4 IMPLANT
FIBERTAPE CERCLAGE TLINK SUT (SUTURE) ×2 IMPLANT
GLENOID UNI REV MOD 24 +2 LAT (Joint) ×1 IMPLANT
GLENOSPHERE 39+4 LAT/24 UNI RV (Joint) ×1 IMPLANT
GLOVE SRG 8 PF TXTR STRL LF DI (GLOVE) ×1 IMPLANT
GLOVE SURG ENC MOIS LTX SZ7 (GLOVE) ×2 IMPLANT
GLOVE SURG ENC MOIS LTX SZ7.5 (GLOVE) ×2 IMPLANT
GLOVE SURG UNDER POLY LF SZ7 (GLOVE) ×2 IMPLANT
GLOVE SURG UNDER POLY LF SZ8 (GLOVE) ×2
GOWN STRL REIN XL XLG (GOWN DISPOSABLE) ×4 IMPLANT
INSERT HUMERAL MED 39/ +3 (Shoulder) IMPLANT
INSERT MEDIUM HUMERAL 39/ +3 (Shoulder) ×2 IMPLANT
KIT BASIN OR (CUSTOM PROCEDURE TRAY) ×2 IMPLANT
KIT TURNOVER KIT A (KITS) ×1 IMPLANT
MANIFOLD NEPTUNE II (INSTRUMENTS) ×2 IMPLANT
NDL TAPERED W/ NITINOL LOOP (MISCELLANEOUS) ×1 IMPLANT
NEEDLE TAPERED W/ NITINOL LOOP (MISCELLANEOUS) ×2 IMPLANT
NS IRRIG 1000ML POUR BTL (IV SOLUTION) ×2 IMPLANT
PACK SHOULDER (CUSTOM PROCEDURE TRAY) ×2 IMPLANT
PAD ARMBOARD 7.5X6 YLW CONV (MISCELLANEOUS) ×2 IMPLANT
PAD COLD SHLDR WRAP-ON (PAD) ×2 IMPLANT
PIN NITINOL TARGETER 2.8 (PIN) IMPLANT
PIN SET MODULAR GLENOID SYSTEM (PIN) ×1 IMPLANT
RESTRAINT HEAD UNIVERSAL NS (MISCELLANEOUS) ×2 IMPLANT
SCREW CENTRAL MOD 30MM (Screw) ×1 IMPLANT
SCREW PERI LOCK 5.5X24 (Screw) ×3 IMPLANT
SCREW PERIPHERAL 5.5X20 LOCK (Screw) ×1 IMPLANT
SLING ARM FOAM STRAP LRG (SOFTGOODS) IMPLANT
SLING ARM FOAM STRAP MED (SOFTGOODS) IMPLANT
SPONGE T-LAP 18X18 ~~LOC~~+RFID (SPONGE) ×1 IMPLANT
SPONGE T-LAP 4X18 ~~LOC~~+RFID (SPONGE) ×2 IMPLANT
STEM HUMERAL UNI REVERS SZ9 (Stem) ×1 IMPLANT
SUCTION FRAZIER HANDLE 12FR (TUBING) ×2
SUCTION TUBE FRAZIER 12FR DISP (TUBING) ×1 IMPLANT
SUT FIBERWIRE #2 38 T-5 BLUE (SUTURE) ×4
SUT MNCRL AB 3-0 PS2 18 (SUTURE) ×2 IMPLANT
SUT MON AB 2-0 CT1 36 (SUTURE) ×2 IMPLANT
SUT VIC AB 1 CT1 36 (SUTURE) ×2 IMPLANT
SUTURE FIBERWR #2 38 T-5 BLUE (SUTURE) IMPLANT
SUTURE TAPE 1.3 40 TPR END (SUTURE) ×2 IMPLANT
SUTURETAPE 1.3 40 TPR END (SUTURE) ×4
TOWEL OR 17X26 10 PK STRL BLUE (TOWEL DISPOSABLE) ×2 IMPLANT
TOWEL OR NON WOVEN STRL DISP B (DISPOSABLE) ×2 IMPLANT
TUBE SUCTION HIGH CAP CLEAR NV (SUCTIONS) ×2 IMPLANT
WATER STERILE IRR 1000ML POUR (IV SOLUTION) ×4 IMPLANT
YANKAUER SUCT BULB TIP 10FT TU (MISCELLANEOUS) IMPLANT

## 2021-10-15 NOTE — Progress Notes (Signed)
Assisted Dr. Sabra Heck with left, ultrasound guided, interscalene brachial plexus block. Side rails up, monitors on throughout procedure. See vital signs in flow sheet. Tolerated Procedure well. ? ?

## 2021-10-15 NOTE — H&P (Signed)
Devin Mercado   ? ?Chief Complaint: Left 4 part proximal humerus fracture ?HPI: The patient is a 71 y.o. male status post ground-level fall sustaining a four-part left shoulder fracture dislocation.  Patient is brought to the operating room at this time for planned left shoulder reverse arthroplasty. ? ?Past Medical History:  ?Diagnosis Date  ? Arthritis   ? OA AND PAIN LEFT HIP  ? CHB (complete heart block) (HCC)   ? a. MDT dual chamber pacemaker  ? HTN (hypertension)   ? Hyperlipidemia   ? Presence of permanent cardiac pacemaker   ? 2000  ? Prostate cancer ([place]) 09/2014  ? ? ?Past Surgical History:  ?Procedure Laterality Date  ? ADENOIDECTOMY AS A CHILD    ? COLONOSCOPY WITH PROPOFOL N/A 12/30/2017  ? Procedure: COLONOSCOPY WITH PROPOFOL;  Surgeon: Ladene Artist, MD;  Location: [facility];  Service: Endoscopy;  Laterality: N/A;  ? JOINT REPLACEMENT    ? Right hip replacement Dr. Alvan Dame 01-05-18  ? LYMPHADENECTOMY Bilateral 11/06/2014  ? Procedure: PELVIC LYMPHADENECTOMY;  Surgeon: [name], MD;  Location: [facility];  Service: Urology;  Laterality: Bilateral;  ? PACEMAKER INSERTION  2000; 2012  ? MDT dual chamber pacemaker implanted 2000 with gen change 2012  ? POLYPECTOMY  12/30/2017  ? Procedure: POLYPECTOMY;  Surgeon: Ladene Artist, MD;  Location: [facility];  Service: Endoscopy;;  ? PROSTATE BIOPSY  09/05/14  ? ROBOT ASSISTED LAPAROSCOPIC RADICAL PROSTATECTOMY N/A 11/06/2014  ? Procedure: ROBOTIC ASSISTED LAPAROSCOPIC RADICAL PROSTATECTOMY LEVEL 2;  Surgeon: [name], MD;  Location: [facility];  Service: Urology;  Laterality: N/A;  ? TOTAL HIP ARTHROPLASTY Left 07/03/2014  ? Procedure: LEFT TOTAL HIP ARTHROPLASTY ANTERIOR APPROACH;  Surgeon: [name], MD;  Location: [facility];  Service: Orthopedics;  Laterality: Left;  ? TOTAL HIP ARTHROPLASTY Right 01/05/2018  ? Procedure: RIGHT TOTAL HIP ARTHROPLASTY ANTERIOR APPROACH;  Surgeon: Paralee Cancel, MD;  Location: [facility];  Service: Orthopedics;   Laterality: Right;  70 mins  ? WISDOM TEETH EXTRACTIONS    ? ? ?Family History  ?Problem Relation Age of Onset  ? Stroke Mother   ?     multiple. 86.   ? Heart disease Mother   ?     hx heart valve problem  ? Prostate cancer Father   ?     86 of prostate cancer  ? Heart attack Neg Hx   ? Hypertension Neg Hx   ? ? ?Social History:  reports that he has never smoked. He has never used smokeless tobacco. He reports current alcohol use of about 2.0 - 3.0 standard drinks per week. He reports that he does not use drugs. ? ? ?Medications Prior to Admission  ?Medication Sig Dispense Refill  ? atorvastatin (LIPITOR) 20 MG tablet Take 1 tablet by mouth once daily 90 tablet 3  ? calcium carbonate (OS-CAL - DOSED IN MG OF ELEMENTAL CALCIUM) 1250 (500 Ca) MG tablet Take 1 tablet by mouth daily.    ? cyclobenzaprine (FLEXERIL) 10 MG tablet 1 q 6-8 hrs prn spasm (Patient taking differently: Take 10 mg by mouth every 6 (six) hours as needed for muscle spasms.) 30 tablet 0  ? ferrous sulfate 325 (65 FE) MG tablet Take 325 mg by mouth daily with breakfast.    ? leuprolide, 6 Month, (ELIGARD) 45 MG injection Inject 45 mg into the skin every 6 (six) months.    ? lutetium Lu 177 Vipivotide Tet (PLUVICTO) 1000 MBQ/ML SOLN Inject into the  vein every 6 (six) weeks.    ? oxyCODONE-acetaminophen (PERCOCET) 5-325 MG tablet 1 q 4-6 hrs prn pain (Patient taking differently: Take 1 tablet by mouth every 4 (four) hours as needed for severe pain.) 20 tablet 0  ? valsartan (DIOVAN) 160 MG tablet Take 1 tablet (160 mg total) by mouth daily. 90 tablet 3  ? denosumab (XGEVA) 120 MG/1.7ML SOLN injection Inject 120 mg into the skin every 2 (two) months.    ? ? ? ?Physical Exam: Patient is a well-developed male in obvious extreme discomfort secondary to left shoulder pain.  Diffuse swelling about the shoulder.  On evaluation in the office last week he was grossly neurovascular intact in left upper extremity.  Skin is intact. ? ?Plain radiographs of the  left shoulder confirm a fracture dislocation with the humeral head and a dislocated subcoracoid position. ? ?Vitals ? ?Temp:  [98.7 ?F (37.1 ?C)] 98.7 ?F (37.1 ?C) (04/04 1220) ?Pulse Rate:  [90] 90 (04/04 1615) ?Resp:  [18-22] 22 (04/04 1615) ?BP: (122-144)/(74-88) 122/74 (04/04 1615) ?SpO2:  [97 %-99 %] 99 % (04/04 1615) ?Weight:  [108.9 kg] 108.9 kg (04/04 1220) ? ?Assessment/Plan ? ?Impression: Left 4 part proximal humerus fracture ? ?Plan of Action: Procedure(s): ?REVERSE SHOULDER ARTHROPLASTY ? ?Alberta Lenhard M Tatem Fesler ?10/15/2021, 4:38 PM ?Contact # [phone] ? ? ? ? ? ?  ?

## 2021-10-15 NOTE — Anesthesia Procedure Notes (Signed)
Anesthesia Regional Block: Interscalene brachial plexus block  ? ?Pre-Anesthetic Checklist: , timeout performed,  Correct Patient, Correct Site, Correct Laterality,  Correct Procedure, Correct Position, site marked,  Risks and benefits discussed,  Surgical consent,  Pre-op evaluation,  At surgeon's request and post-op pain management ? ?Laterality: Left ? ?Prep: chloraprep     ?  ?Needles:  ?Injection technique: Single-shot ? ?Needle Type: Stimiplex   ? ? ?Needle Length: 9cm  ?Needle Gauge: 21  ? ? ? ?Additional Needles: ? ? ?Procedures:,,,, ultrasound used (permanent image in chart),,    ?Narrative:  ?Start time: 10/15/2021 4:06 PM ?End time: 10/15/2021 4:11 PM ?Injection made incrementally with aspirations every 5 mL. ? ?Performed by: Personally  ?Anesthesiologist: Lynda Rainwater, MD ? ? ? ? ?

## 2021-10-15 NOTE — Transfer of Care (Signed)
Immediate Anesthesia Transfer of Care Note ? ?Patient: Devin Mercado ? ?Procedure(s) Performed: REVERSE SHOULDER ARTHROPLASTY (Left: Shoulder) ? ?Patient Location: PACU ? ?Anesthesia Type:General and Regional ? ?Level of Consciousness: drowsy ? ?Airway & Oxygen Therapy: Patient Spontanous Breathing and Patient connected to face mask ? ?Post-op Assessment: Report given to RN and Post -op Vital signs reviewed and stable ? ?Post vital signs: Reviewed and stable ? ?Last Vitals:  ?Vitals Value Taken Time  ?BP 107/56 10/15/21 1921  ?Temp 36.7 ?C 10/15/21 1921  ?Pulse 90 10/15/21 1925  ?Resp 17 10/15/21 1925  ?SpO2 93 % 10/15/21 1925  ?Vitals shown include unvalidated device data. ? ?Last Pain:  ?Vitals:  ? 10/15/21 1615  ?PainSc: Asleep  ?   ? ?  ? ?Complications: No notable events documented. ?

## 2021-10-15 NOTE — Anesthesia Preprocedure Evaluation (Addendum)
Anesthesia Evaluation  ?Patient identified by MRN, date of birth, ID band ?Patient awake ? ? ? ?Reviewed: ?Allergy & Precautions, NPO status , Patient's Chart, lab work & pertinent test results, reviewed documented beta blocker date and time  ? ?Airway ?Mallampati: II ? ?TM Distance: >3 FB ?Neck ROM: Full ? ? ? Dental ?no notable dental hx. ?(+) Teeth Intact, Caps, Dental Advisory Given ?  ?Pulmonary ?neg pulmonary ROS,  ?  ?Pulmonary exam normal ?breath sounds clear to auscultation ? ? ? ? ? ? Cardiovascular ?hypertension, Pt. on medications ?Normal cardiovascular exam+ dysrhythmias + pacemaker  ?Rhythm:Regular Rate:Normal ? ?Hx/o Complete Heart Block S/P DDD permanent pacemaker ?  ?Neuro/Psych ?negative neurological ROS ? negative psych ROS  ? GI/Hepatic ?negative GI ROS, Neg liver ROS,   ?Endo/Other  ?Hyperlipidemia ? Renal/GU ?negative Renal ROS  ? ?Hx/o prostate Ca S/P robotic radical prostatectomy ?Elevated PSA ? ?  ?Musculoskeletal ? ?(+) Arthritis , Osteoarthritis,  Left proximal humeral Fx  ? Abdominal ?(+) + obese,   ?Peds ? Hematology ? ?(+) Blood dyscrasia, anemia ,   ?Anesthesia Other Findings ? ? Reproductive/Obstetrics ? ?  ? ? ? ? ? ? ? ? ? ? ? ? ? ?  ?  ? ? ? ? ? ? ? ?Anesthesia Physical ?Anesthesia Plan ? ?ASA: 3 ? ?Anesthesia Plan: General  ? ?Post-op Pain Management: Regional block*  ? ?Induction: Intravenous ? ?PONV Risk Score and Plan: 3 and Treatment may vary due to age or medical condition and Ondansetron ? ?Airway Management Planned: Oral ETT ? ?Additional Equipment: None ? ?Intra-op Plan:  ? ?Post-operative Plan: Extubation in OR ? ?Informed Consent: I have reviewed the patients History and Physical, chart, labs and discussed the procedure including the risks, benefits and alternatives for the proposed anesthesia with the patient or authorized representative who has indicated his/her understanding and acceptance.  ? ? ? ?Dental advisory given ? ?Plan  Discussed with: CRNA and Anesthesiologist ? ?Anesthesia Plan Comments: (PPM reprogrammed to asynchronous mode rate of 90/min for procedure. [employer] rep will reprogram back to original settings in PACU)  ? ? ? ? ? ?Anesthesia Quick Evaluation ? ?

## 2021-10-15 NOTE — Discharge Instructions (Signed)
? ?Kevin M. Supple, M.D., F.A.A.O.S. ?Orthopaedic Surgery ?Specializing in Arthroscopic and Reconstructive ?Surgery of the Shoulder ?[phone] ?[address] - [place], [place] [address] - Fax [phone] ? ? ?POST-OP TOTAL SHOULDER REPLACEMENT INSTRUCTIONS ? ?1. Follow up in the office for your first post-op appointment 10-14 days from the date of your surgery. If you do not already have a scheduled appointment, our office will contact you to schedule. ? ?2. The bandage over your incision is waterproof. You may begin showering with this dressing on. You may leave this dressing on until first follow up appointment within 2 weeks. We prefer you leave this dressing in place until follow up however after 5-7 days if you are having itching or skin irritation and would like to remove it you may do so. Go slow and tug at the borders gently to break the bond the dressing has with the skin. At this point if there is no drainage it is okay to go without a bandage or you may cover it with a light guaze and tape. You can also expect significant bruising around your shoulder that will drift down your arm and into your chest wall. This is very normal and should resolve over several days. ? ? 3. Wear your sling/immobilizer at all times except to perform the exercises below or to occasionally let your arm dangle by your side to stretch your elbow. You also need to sleep in your sling immobilizer until instructed otherwise. It is ok to remove your sling if you are sitting in a controlled environment and allow your arm to rest in a position of comfort by your side or on your lap with pillows to give your neck and skin a break from the sling. You may remove it to allow arm to dangle by side to shower. If you are up walking around and when you go to sleep at night you need to wear it. ? ?4. Range of motion to your elbow, wrist, and hand are encouraged 3-5 times daily. Exercise to your hand and fingers helps to reduce  swelling you may experience. ? ? ?5. Prescriptions for a pain medication and a muscle relaxant are provided for you. It is recommended that if you are experiencing pain that you pain medication alone is not controlling, add the muscle relaxant along with the pain medication which can give additional pain relief. The first 1-2 days is generally the most severe of your pain and then should gradually decrease. As your pain lessens it is recommended that you decrease your use of the pain medications to an "as needed basis'" only and to always comply with the recommended dosages of the pain medications. ? ?6. Pain medications can produce constipation along with their use. If you experience this, the use of an over the counter stool softener or laxative daily is recommended.  ? ?7. For additional questions or concerns, please do not hesitate to call the office. If after hours there is an answering service to forward your concerns to the physician on call. ? ?8.Pain control following an exparel block ? ?To help control your post-operative pain you received a nerve block  performed with Exparel which is a long acting anesthetic (numbing agent) which can provide pain relief and sensations of numbness (and relief of pain) in the operative shoulder and arm for up to 3 days. Sometimes it provides mixed relief, meaning you may still have numbness in certain areas of the arm but can still be able to   move  parts of that arm, hand, and fingers. We recommend that your prescribed pain medications  be used as needed. We do not feel it is necessary to "pre medicate" and "stay ahead" of pain.  Taking narcotic pain medications when you are not having any pain can lead to unnecessary and potentially dangerous side effects.   ? ?9. Use the ice machine as much as possible in the first 5-7 days from surgery, then you can wean its use to as needed. The ice typically needs to be replaced every 6 hours, instead of ice you can actually freeze  water bottles to put in the cooler and then fill water around them to avoid having to purchase ice. You can have spare water bottles freezing to allow you to rotate them once they have melted. Try to have a thin shirt or light cloth or towel under the ice wrap to protect your skin.  ? ?FOR ADDITIONAL INFO ON ICE MACHINE AND INSTRUCTIONS GO TO THE WEBSITE AT ? ?[email or web address]://massey-hart.[email or web address]/ ? ?10.  We recommend that you avoid any dental work or cleaning in the first 3 months following your joint replacement. This is to help minimize the possibility of infection from the bacteria in your mouth that enters your bloodstream during dental work. We also recommend that you take an antibiotic prior to your dental work for the first year after your shoulder replacement to further help reduce that risk. Please simply contact our office for antibiotics to be sent to your pharmacy prior to dental work. ? ?11. Dental Antibiotics: ? ?We recommend waiting at least 3 months for any dental work even cleanings unless there is a IT consultant. We also recommend  prophylactic antibiotics for all dental procdeures  the first year following your joint replacement. In some exceptions we recommend them to be used lifelong. We will provide you with that prescription in follow up office visits, or you can call our office. ? ?Exceptions are as follows: ? ?1. History of prior total joint infection ? ?2. Severely immunocompromised (Organ Transplant, cancer chemotherapy, Rheumatoid biologic ?meds such as [place]) ? ?3. Poorly controlled diabetes (A1C &gt; 8.0, blood glucose over 200) ? ? ?POST-OP EXERCISES ? ?As instructed by the therapist ? ?  ?

## 2021-10-15 NOTE — Anesthesia Procedure Notes (Signed)
Procedure Name: Intubation ?Date/Time: 10/15/2021 5:00 PM ?Performed by: Milford Cage, CRNA ?Pre-anesthesia Checklist: Patient identified, Emergency Drugs available, Suction available and Patient being monitored ?Patient Re-evaluated:Patient Re-evaluated prior to induction ?Oxygen Delivery Method: Circle system utilized ?Preoxygenation: Pre-oxygenation with 100% oxygen ?Induction Type: IV induction ?Ventilation: Mask ventilation without difficulty ?Laryngoscope Size: Mac and 4 ?Grade View: Grade III ?Tube type: Oral ?Tube size: 7.5 mm ?Number of attempts: 2 ?Airway Equipment and Method: Stylet ?Placement Confirmation: ETT inserted through vocal cords under direct vision, positive ETCO2 and breath sounds checked- equal and bilateral ?Secured at: 23 cm ?Tube secured with: Tape ?Dental Injury: Teeth and Oropharynx as per pre-operative assessment  ? ? ? ? ?

## 2021-10-15 NOTE — Anesthesia Postprocedure Evaluation (Signed)
Anesthesia Post Note ? ?Patient: LORANCE PICKERAL ? ?Procedure(s) Performed: REVERSE SHOULDER ARTHROPLASTY (Left: Shoulder) ? ?  ? ?Patient location during evaluation: PACU ?Anesthesia Type: General ?Level of consciousness: awake and alert ?Pain management: pain level controlled ?Vital Signs Assessment: post-procedure vital signs reviewed and stable ?Respiratory status: spontaneous breathing, nonlabored ventilation and respiratory function stable ?Cardiovascular status: blood pressure returned to baseline and stable ?Postop Assessment: no apparent nausea or vomiting ?Anesthetic complications: no ? ? ?No notable events documented. ? ?Last Vitals:  ?Vitals:  ? 10/15/21 1921 10/15/21 1930  ?BP: (!) 107/56 103/88  ?Pulse: 89 90  ?Resp: 18 19  ?Temp: 36.7 ?C   ?SpO2: 90% 94%  ?  ?Last Pain:  ?Vitals:  ? 10/15/21 1930  ?PainSc: 0-No pain  ? ? ?  ?  ?  ?  ?  ?  ? ?Lynda Rainwater ? ? ? ? ?

## 2021-10-15 NOTE — Op Note (Signed)
10/15/2021 ? ?7:00 PM ? ?PATIENT:   Devin Mercado  71 y.o. male ? ?PRE-OPERATIVE DIAGNOSIS:  Left 4 part proximal humerus fracture/dislocation ? ?POST-OPERATIVE DIAGNOSIS: Same ? ?PROCEDURE: Left shoulder reverse arthroplasty utilizing a press-fit size 9 Arthrex stem with a neutral metaphysis, +3 polyethylene insert, 39/+4 glenosphere on a small/+2 baseplate ? ?SURGEON:  Marin Shutter M.D. ? ?ASSISTANTS: Jenetta Loges, PA-C ? ?ANESTHESIA:   General endotracheal and interscalene block with Exparel ? ?EBL: 500 cc ? ?SPECIMEN: None ? ?Drains: None ? ? ?PATIENT DISPOSITION:  PACU - hemodynamically stable. ? ? ? ?PLAN OF CARE: Admit for overnight observation ? ?Brief history: ? ?Patient is a 71 year old male who had a ground level fall sustaining a left shoulder fracture dislocation with the humeral head articular segment lodged anterior inferior beneath the glenoid and scapular neck and in addition fracturing through the tuberosities.  On evaluation in the office last week he was noted to have diffuse swelling and severe pain involving the left shoulder but was grossly neurovascular intact.  Skin was intact.  Upon review of the radiographs and discussion of treatment options he is now brought to the operating room for planned left shoulder reverse arthroplasty. ? ?Preoperatively, I counseled the patient regarding treatment options and risks versus benefits thereof.  Possible surgical complications were all reviewed including potential for bleeding, infection, neurovascular injury, persistent pain, loss of motion, anesthetic complication, failure of the implant, and possible need for additional surgery. They understand and accept and agrees with our planned procedure. ? ? ?Procedure in detail: ? ?After undergoing routine preop evaluation the patient received prophylactic antibiotics and interscalene block with Exparel was established in the holding area by the anesthesia department.  Subsequently placed spine on the  operating table underwent the smooth induction of a general endotracheal anesthesia.  Placed into the beachchair position and appropriately padded and protected.  The left shoulder girdle region was sterilely prepped and draped in standard fashion.  Timeout was called.  A deltopectoral approach left shoulder is made an approximate 12 cm incision.  Skin flaps were elevated dissection carried deeply and the deltopectoral interval was developed from proximal to distal with the vein taken laterally and we took care to protect and avoid his pacemaker in the anterior chest wall.  Conjoined tendon was mobilized and retracted medially.  At this point we identified the bicipital groove with the long head biceps tendon then tenodesed at the upper border of the pectoralis major tendon and the proximal segment was unroofed and excised allowing identification of the interval between the greater and lesser tuberosities which it did have fracture lines passing along the bicipital groove and these were completed using an osteotome and we then split the rotator cuff along the rotator interval to the base of the coracoid and then mobilized subscapularis debulk the remnant of the lesser tuberosity and then placed grasping sutures at the bone tendon junction.  We then turned our attention to the greater tuberosity which had several fragments which were then debulked and a series of grasping sutures were placed at the bone tendon junction of the greater tuberosity and rotator cuff superiorly and posteriorly.  We then gained exposure of the glenoid and a circumferential labral resection was performed which allowed [place] to improve our visualization deep within the joint and at this point then the humeral shaft was retracted posteriorly and with careful blunt dissection we were able to then identify and isolate the dislocated humeral head articular segment which was inferior anterior  beneath the glenoid and scapular neck region.  This was  carefully mobilized and graft with a pair of towel clips and the segment was then removed in a single piece.  At this point we had appropriate access to proceed with the placement of the glenoid baseplate.  This was placed using standard technique with our central guidepin followed by the central and peripheral reamers to a stable subchondral bony bed.  Preparation completed with the central drill and tap for a 30 mm lag screw.  The baseplate was then assembled and inserted with vancomycin powder applied to the threads of the lag screw and excellent purchase was achieved.  All of the peripheral locking screws were then placed using standard technique again with excellent purchase and fixation.  A 39/+4 glenosphere was then impacted over the baseplate and the central locking screw was placed.  We then returned our attention back to the proximal humerus where the canal was opened by hand reaming we ultimately broached up to a size 9 stem at approximately 20 degrees retroversion.  There was very little proximal bone for reaming but we did apply the central reaming post and removed a small fragment of bone medially at the calcar.  Trial implant was placed and trial reduction of this point showed good motion good stability good soft tissue balance.  The trial was then removed and our final implant was assembled.  Vancomycin powder was then spread into the humeral canal after to being cleaned and dried.  The stem was then seated with excellent purchase and fixation.  I should mention that prior to preparation of the humeral canal we did place a fiber cerclage tape around the humeral metaphysis to protect the proximal bone and prevent propagation of the unicortical fracture.  Once the implant was seated we then again terminally tightened the cerclage tape and completed fixation.  This point trial reduction showed best soft tissue balance with a +3 poly-.  The trial poly was removed the final poly was then impacted on the  implant after was cleaned and dried.  Final reduction showed again good motion good stability and good soft tissue balance.  We then confirmed proper positioning of the tuberosities about the implant and did utilize sutures through the eyelets on the collar of her implant to help further fasten the tuberosities to the implant we also use the limbs of the fiber cerclage to fasten the tuberosities down to the shaft and the construct allowed [place] to close the soft tissue envelope around the implant bringing the tuberosities into excellent position around the humeral metaphysis and the implant much to our satisfaction.  Suture limbs were all then clipped.  Final hemostasis was obtained.  The balance of the vancomycin powder was then spread liberally throughout the deep soft tissue layers.  The deltopectoral interval was reapproximated with a series of figure-of-eight number Vicryl sutures.  2-0 Monocryl used to close the subcu layer and intracuticular 3-0 Monocryl for the skin followed by Dermabond and Aquacel dressing.  The left arm was then placed into a sling and the patient was awakened, extubated, and taken to the recovery room in stable condition. ? ?Jenetta Loges, PA-C was utilized as an Environmental consultant throughout this case, essential for help with positioning the patient, positioning extremity, tissue manipulation, implantation of the prosthesis, suture management, wound closure, and intraoperative decision-making. ? ?Marin Shutter MD ? ? ?Contact # [phone] ? ? ? ? ?

## 2021-10-16 ENCOUNTER — Other Ambulatory Visit (HOSPITAL_BASED_OUTPATIENT_CLINIC_OR_DEPARTMENT_OTHER): Payer: Self-pay

## 2021-10-16 DIAGNOSIS — Z950 Presence of cardiac pacemaker: Secondary | ICD-10-CM | POA: Diagnosis not present

## 2021-10-16 DIAGNOSIS — I442 Atrioventricular block, complete: Secondary | ICD-10-CM | POA: Diagnosis not present

## 2021-10-16 DIAGNOSIS — R0902 Hypoxemia: Secondary | ICD-10-CM | POA: Diagnosis not present

## 2021-10-16 DIAGNOSIS — I10 Essential (primary) hypertension: Secondary | ICD-10-CM | POA: Diagnosis not present

## 2021-10-16 DIAGNOSIS — E785 Hyperlipidemia, unspecified: Secondary | ICD-10-CM | POA: Diagnosis not present

## 2021-10-16 DIAGNOSIS — D649 Anemia, unspecified: Secondary | ICD-10-CM | POA: Diagnosis not present

## 2021-10-16 DIAGNOSIS — Z8546 Personal history of malignant neoplasm of prostate: Secondary | ICD-10-CM | POA: Diagnosis not present

## 2021-10-16 DIAGNOSIS — M19012 Primary osteoarthritis, left shoulder: Secondary | ICD-10-CM | POA: Diagnosis not present

## 2021-10-16 DIAGNOSIS — S42292A Other displaced fracture of upper end of left humerus, initial encounter for closed fracture: Secondary | ICD-10-CM | POA: Diagnosis not present

## 2021-10-16 MED ORDER — CYCLOBENZAPRINE HCL 10 MG PO TABS
ORAL_TABLET | ORAL | 0 refills | Status: DC
  Filled 2021-10-16: qty 30, 6d supply, fill #0

## 2021-10-16 MED ORDER — OXYCODONE-ACETAMINOPHEN 5-325 MG PO TABS
ORAL_TABLET | ORAL | 0 refills | Status: DC
  Filled 2021-10-16: qty 20, 3d supply, fill #0

## 2021-10-16 NOTE — Progress Notes (Signed)
Transition of Care (TOC) Screening Note ? ?Patient Details  ?Name: Devin Mercado ?Date of Birth: [birth year] ? ?Transition of Care (TOC) CM/SW Contact:    ?Sherie Don, LCSW ?Phone Number: ?10/16/2021, 12:36 PM ? ?Transition of Care Department [facility]) has reviewed patient and no TOC needs have been identified at this time. We will continue to monitor patient advancement through interdisciplinary progression rounds. If new patient transition needs arise, please place a TOC consult. ?

## 2021-10-16 NOTE — Progress Notes (Signed)
Reviewed d/c instructions w/ patient. Verbalized understanding of all instructions. Will d/c w/ all belongings, instructions, equipment.  ?

## 2021-10-16 NOTE — Evaluation (Signed)
Occupational Therapy Evaluation ?Patient Details ?Name: Devin Mercado ?MRN: [id number] ?DOB: 1951-05-06 ?Today's Date: 10/16/2021 ? ? ?History of Present Illness Patient s/p left reverse TSA  ? ?Clinical Impression ?  ?Mr. Edrei Norgaard is a 71 year old man who presents s/p shoulder replacement without functional use of left non-dominant upper extremity secondary to effects of surgery and interscalene block and shoulder precautions. Therapist provided education and instruction to patient in regards to exercises, precautions, positioning, donning upper extremity clothing and bathing while maintaining shoulder precautions, ice and edema management and donning/doffing sling. Patient verbalized understanding and handouts provided to maximize retention of education. Patient needed assistance to donn shirt, underwear, pants, and sling. Patient to follow up with MD for further therapy needs.    ?   ? ?Recommendations for follow up therapy are one component of a multi-disciplinary discharge planning process, led by the attending physician.  Recommendations may be updated based on patient status, additional functional criteria and insurance authorization.  ? ?Follow Up Recommendations ? Follow physician's recommendations for discharge plan and follow up therapies  ?  ?Assistance Recommended at Discharge Intermittent Supervision/Assistance  ?Patient can return home with the following A lot of help with bathing/dressing/bathroom;Assistance with cooking/housework ? ?  ?Functional Status Assessment ? Patient has had a recent decline in their functional status and demonstrates the ability to make significant improvements in function in a reasonable and predictable amount of time.  ?Equipment Recommendations ? None recommended by OT  ?  ?Recommendations for Other Services   ? ? ?  ?Precautions / Restrictions Precautions ?Precautions: Shoulder ?Type of Shoulder Precautions: PROM 10 ER, 45 ABD,60 FE , PASSIVE ROM FOR ADL's ONLY, NOT  for EXERCISES. OK to exercise elbow wrist and hand rom and for edema control   No pendulums, may allow arm to dangle   Pt may shower ?Shoulder Interventions: Shoulder sling/immobilizer;Off for dressing/bathing/exercises ?Precaution Booklet Issued:  (handouts) ?Required Braces or Orthoses: Sling ?Restrictions ?Weight Bearing Restrictions: Yes ?LUE Weight Bearing: Non weight bearing  ? ?  ? ?   ?Balance Overall balance assessment: Mild deficits observed, not formally tested ?  ?  ?  ?  ?  ?  ?  ?  ?  ?  ?  ?  ?  ?  ?  ?  ?  ?  ?   ? ?ADL either performed or assessed with clinical judgement  ? ?ADL Overall ADL's : Needs assistance/impaired ?Eating/Feeding: Set up ?  ?Grooming: Modified independent ?  ?Upper Body Bathing: Moderate assistance;Sitting ?  ?Lower Body Bathing: Moderate assistance;Sit to/from stand ?  ?Upper Body Dressing : Maximal assistance;Sitting;Adhering to UE precautions ?  ?Lower Body Dressing: Moderate assistance;Sit to/from stand ?  ?Toilet Transfer: Min guard ?  ?Toileting- Clothing Manipulation and Hygiene: Moderate assistance ?  ?  ?  ?Functional mobility during ADLs: Min guard ?   ? ? ? ?Vision Baseline Vision/History: 1 Wears glasses ?   ?   ?Perception   ?  ?Praxis   ?  ? ?Pertinent Vitals/Pain Pain Assessment ?Pain Assessment: Faces ?Faces Pain Scale: Hurts a little bit ?Pain Location: Left shoulder ?Pain Descriptors / Indicators: Grimacing ?Pain Intervention(s): Monitored during session  ? ? ? ?Hand Dominance   ?  ?Extremity/Trunk Assessment Upper Extremity Assessment ?Upper Extremity Assessment: LUE deficits/detail ?LUE Deficits / Details: impaired arom and sensation secondary to block ?  ?Lower Extremity Assessment ?Lower Extremity Assessment: Overall WFL for tasks assessed ?  ?Cervical / Trunk Assessment ?Cervical / Trunk Assessment:  Normal ?  ?Communication   ?  ?Cognition Arousal/Alertness: Awake/alert ?Behavior During Therapy: [facility] for tasks assessed/performed ?Overall Cognitive Status:  Within Functional Limits for tasks assessed ?  ?  ?  ?  ?  ?  ?  ?  ?  ?  ?  ?  ?  ?  ?  ?  ?  ?  ?  ?General Comments    ? ?  ?Exercises   ?  ?Shoulder Instructions Shoulder Instructions ?Donning/doffing shirt without moving shoulder: Patient able to independently direct caregiver ?Method for sponge bathing under operated UE: Patient able to independently direct caregiver ?Donning/doffing sling/immobilizer: Patient able to independently direct caregiver ?Correct positioning of sling/immobilizer: Independent ?ROM for elbow, wrist and digits of operated UE: Independent ?Sling wearing schedule (on at all times/off for ADL's): Independent ?Proper positioning of operated UE when showering: Independent ?Dressing change: Independent ?Positioning of UE while sleeping: Independent  ? ? ?Home Living Family/patient expects to be discharged to:: Private residence ?Living Arrangements: Spouse/significant other ?  ?  ?  ?  ?  ?  ?  ?  ?  ?  ?  ?  ?  ?  ?  ?  ?  ? ?  ?Prior Functioning/Environment   ?  ?  ?  ?  ?  ?  ?  ?  ?  ? ?  ?  ?OT Problem List: Decreased strength;Decreased range of motion;Obesity;Impaired UE functional use;Pain ?  ?   ?OT Treatment/Interventions:    ?  ?OT Goals(Current goals can be found in the care plan section) Acute Rehab OT Goals ?OT Goal Formulation: All assessment and education complete, DC therapy  ?OT Frequency:   ?  ? ?Co-evaluation   ?  ?  ?  ?  ? ?  ?AM-PAC OT "6 Clicks" Daily Activity     ?Outcome Measure Help from another person eating meals?: A Little ?Help from another person taking care of personal grooming?: A Little ?Help from another person toileting, which includes using toliet, bedpan, or urinal?: A Lot ?Help from another person bathing (including washing, rinsing, drying)?: A Little ?Help from another person to put on and taking off regular upper body clothing?: A Lot ?Help from another person to put on and taking off regular lower body clothing?: A Lot ?6 Click Score: 15 ?  ?End of  Session Nurse Communication:  (OT education complete, bleeding on back of arm) ? ?Activity Tolerance: Patient tolerated treatment well ?Patient left: in chair ? ?OT Visit Diagnosis: Muscle weakness (generalized) (M62.81)  ?              ?Time: 6606-3016 ?OT Time Calculation (min): 31 min ?Charges:  OT General Charges ?$OT Visit: 1 Visit ?OT Evaluation ?$OT Eval Low Complexity: 1 Low ?OT Treatments ?$Self Care/Home Management : 8-22 mins ? ?Jaysun Wessels, OTR/L ?Acute Care Rehab Services  ?Office [phone] ?Pager: [phone]  ? ?Chad Tiznado L Kathyrn Warmuth ?10/16/2021, 10:40 AM ?

## 2021-10-16 NOTE — Plan of Care (Cosign Needed)
Completed during admission assessment ?

## 2021-10-16 NOTE — Discharge Summary (Signed)
PATIENT ID:      Devin Mercado  ?MRN:     [id number] ?DOB/AGE:    Feb 05, 1951 / 71 y.o. ? ?   DISCHARGE SUMMARY ? ?ADMISSION DATE:    10/15/2021 ?DISCHARGE DATE:   10/16/21 ? ?ADMISSION DIAGNOSIS: Left 4 part proximal humerus fracture ?Past Medical History:  ?Diagnosis Date  ? Arthritis   ? OA AND PAIN LEFT HIP  ? CHB (complete heart block) (HCC)   ? a. MDT dual chamber pacemaker  ? HTN (hypertension)   ? Hyperlipidemia   ? Presence of permanent cardiac pacemaker   ? 2000  ? Prostate cancer ([place]) 09/2014  ? ? ?DISCHARGE DIAGNOSIS:   Principal Problem: ?  S/P reverse total shoulder arthroplasty, left ? ? ?PROCEDURE: Procedure(s): ?REVERSE SHOULDER ARTHROPLASTY on 10/15/2021 ? ?CONSULTS:   ? ?HISTORY:  See H&P in chart. ? ?HOSPITAL COURSE:  Devin Mercado is a 71 y.o. admitted on 10/15/2021 with a diagnosis of Left 4 part proximal humerus fracture.  They were brought to the operating room on 10/15/2021 and underwent Procedure(s): ?REVERSE SHOULDER ARTHROPLASTY.   ? ?They were given perioperative antibiotics:  ?Anti-infectives (From admission, onward)  ? ? Start     Dose/Rate Route Frequency Ordered Stop  ? 10/16/21 0600  ceFAZolin (ANCEF) IVPB 2g/100 mL premix       ? 2 g ?200 mL/hr over 30 Minutes Intravenous On call to O.R. 10/15/21 1218 10/15/21 1735  ? 10/15/21 1727  vancomycin (VANCOCIN) powder  Status:  Discontinued       ?   As needed 10/15/21 1727 10/15/21 2110  ? ?  ?. ? ?Patient underwent the above named procedure and tolerated it well. The following day they were hemodynamically stable and pain was controlled on oral analgesics. They were neurovascularly intact to the operative extremity. OT was ordered and worked with patient per protocol. They were medically and orthopaedically stable for discharge on . ? ? ? ?DIAGNOSTIC STUDIES: ? ?RECENT RADIOGRAPHIC STUDIES :  No results found. ? ?RECENT VITAL SIGNS:  Patient Vitals for the past 24 hrs: ? BP Temp Temp src Pulse Resp SpO2 Height Weight  ?10/16/21 0539 98/60 98.6  ?F (37 ?C) Oral 76 17 97 % -- --  ?10/16/21 0139 (!) 98/57 98.4 ?F (36.9 ?C) Oral 77 17 97 % -- --  ?10/15/21 2321 112/74 98.8 ?F (37.1 ?C) Oral 82 17 96 % -- --  ?10/15/21 2114 109/64 99.1 ?F (37.3 ?C) Oral 78 15 96 % -- --  ?10/15/21 2100 103/67 98.8 ?F (37.1 ?C) -- 74 15 96 % -- --  ?10/15/21 2045 110/66 -- -- 77 18 97 % -- --  ?10/15/21 2030 104/72 -- -- 77 15 95 % -- --  ?10/15/21 2015 102/64 -- -- 90 17 94 % -- --  ?10/15/21 2000 101/66 -- -- 90 15 94 % -- --  ?10/15/21 1945 108/70 -- -- 90 17 94 % -- --  ?10/15/21 1931 103/88 -- -- 89 20 95 % -- --  ?10/15/21 1930 103/88 -- -- 90 19 94 % -- --  ?10/15/21 1921 (!) 107/56 98.1 ?F (36.7 ?C) -- 89 18 90 % -- --  ?10/15/21 1615 122/74 -- -- 90 (!) 22 99 % -- --  ?10/15/21 1610 127/77 -- -- 90 (!) 21 98 % -- --  ?10/15/21 1605 (!) 144/88 -- -- 90 20 97 % -- --  ?10/15/21 1600 139/81 -- -- 90 19 99 % -- --  ?10/15/21 1220 -- 98.7 ?  F (37.1 ?C) -- -- 18 -- 5' 11.5" (1.816 m) 108.9 kg  ?. ? ?RECENT EKG RESULTS:    ?Orders placed or performed in visit on 05/14/21  ? EKG 12-Lead  ? ? ?DISCHARGE INSTRUCTIONS: ? ? ? ?DISCHARGE MEDICATIONS:   ?Allergies as of 10/16/2021   ?No Known Allergies ?  ? ?  ?Medication List  ?  ? ?TAKE these medications   ? ?atorvastatin 20 MG tablet ?Commonly known as: LIPITOR ?Take 1 tablet by mouth once daily ?  ?calcium carbonate 1250 (500 Ca) MG tablet ?Commonly known as: OS-CAL - dosed in mg of elemental calcium ?Take 1 tablet by mouth daily. ?  ?cyclobenzaprine 10 MG tablet ?Commonly known as: FLEXERIL ?Take 1 tablet by mouth every 6-8 hours as needed for spasms ?(1 q 6-8 hrs prn spasm) ?What changed:  ?how much to take ?how to take this ?when to take this ?reasons to take this ?  ?ferrous sulfate 325 (65 FE) MG tablet ?Take 325 mg by mouth daily with breakfast. ?  ?leuprolide (6 Month) 45 MG injection ?Commonly known as: ELIGARD ?Inject 45 mg into the skin every 6 (six) months. ?  ?oxyCODONE-acetaminophen 5-325 MG tablet ?Commonly known as:  Percocet ?Take 1 tablet by mouth every 4-6 hours as needed for pain ?(1 q 4-6 hrs prn pain) ?What changed:  ?how much to take ?how to take this ?when to take this ?reasons to take this ?  ?Pluvicto 1000 MBQ/ML Soln ?Generic drug: lutetium Lu 177 Vipivotide Tet ?Inject into the vein every 6 (six) weeks. ?  ?valsartan 160 MG tablet ?Commonly known as: DIOVAN ?Take 1 tablet (160 mg total) by mouth daily. ?  ?Xgeva 120 MG/1.7ML Soln injection ?Generic drug: denosumab ?Inject 120 mg into the skin every 2 (two) months. ?  ? ?  ? ? ?FOLLOW UP VISIT:   ? Follow-up Information   ? ? Justice Britain, MD Follow up.   ?Specialty: Orthopedic Surgery ?Why: we will call you with your post op appt ?Contact information: ?[address] ?[address] ?[place] [address] ?[phone] ? ? ?  ?  ? ?  ?  ? ?  ? ? ?DISCHARGE TO: Home ? ? ?DISCHARGE CONDITION:  Good ? ? ? ? ?[employer] for Dr. Justice Britain ?10/16/2021, 8:12 AM ?  ? ?

## 2021-10-18 ENCOUNTER — Encounter (HOSPITAL_COMMUNITY): Payer: Self-pay | Admitting: Orthopedic Surgery

## 2021-10-22 ENCOUNTER — Encounter (HOSPITAL_COMMUNITY): Payer: Self-pay | Admitting: Orthopedic Surgery

## 2021-10-24 ENCOUNTER — Other Ambulatory Visit (INDEPENDENT_AMBULATORY_CARE_PROVIDER_SITE_OTHER): Payer: Medicare PPO

## 2021-10-24 ENCOUNTER — Other Ambulatory Visit: Payer: Self-pay

## 2021-10-24 ENCOUNTER — Telehealth: Payer: Self-pay | Admitting: Family Medicine

## 2021-10-24 DIAGNOSIS — C61 Malignant neoplasm of prostate: Secondary | ICD-10-CM

## 2021-10-24 LAB — COMPREHENSIVE METABOLIC PANEL
ALT: 13 U/L (ref 0–53)
AST: 13 U/L (ref 0–37)
Albumin: 3.4 g/dL — ABNORMAL LOW (ref 3.5–5.2)
Alkaline Phosphatase: 84 U/L (ref 39–117)
BUN: 18 mg/dL (ref 6–23)
CO2: 20 mEq/L (ref 19–32)
Calcium: 7.7 mg/dL — ABNORMAL LOW (ref 8.4–10.5)
Chloride: 105 mEq/L (ref 96–112)
Creatinine, Ser: 0.99 mg/dL (ref 0.40–1.50)
GFR: 75.78 mL/min (ref >60.00)
Glucose, Bld: 128 mg/dL — ABNORMAL HIGH (ref 70–99)
Potassium: 4.7 mEq/L (ref 3.5–5.1)
Sodium: 134 mEq/L — ABNORMAL LOW (ref 135–145)
Total Bilirubin: 0.7 mg/dL (ref 0.2–1.2)
Total Protein: 5.8 g/dL — ABNORMAL LOW (ref 6.0–8.3)

## 2021-10-24 LAB — CBC WITH DIFFERENTIAL/PLATELET
Basophils Absolute: 0 10*3/uL (ref 0.0–0.1)
Basophils Relative: 0.3 % (ref 0.0–3.0)
Eosinophils Absolute: 0.1 10*3/uL (ref 0.0–0.7)
Eosinophils Relative: 1.5 % (ref 0.0–5.0)
HCT: 22.7 % — CL (ref 39.0–52.0)
Hemoglobin: 7.8 g/dL — CL (ref 13.0–17.0)
Lymphocytes Relative: 9.1 % — ABNORMAL LOW (ref 12.0–46.0)
Lymphs Abs: 0.6 10*3/uL — ABNORMAL LOW (ref 0.7–4.0)
MCHC: 34.3 g/dL (ref 30.0–36.0)
MCV: 87.5 fl (ref 78.0–100.0)
Monocytes Absolute: 0.4 10*3/uL (ref 0.1–1.0)
Monocytes Relative: 6.8 % (ref 3.0–12.0)
Neutro Abs: 5.1 10*3/uL (ref 1.4–7.7)
Neutrophils Relative %: 82.3 % — ABNORMAL HIGH (ref 43.0–77.0)
Platelets: 331 10*3/uL (ref 150.0–400.0)
RBC: 2.6 Mil/uL — ABNORMAL LOW (ref 4.22–5.81)
RDW: 14.1 % (ref 11.5–15.5)
WBC: 6.2 10*3/uL (ref 4.0–10.5)

## 2021-10-24 LAB — PSA: PSA: 13.23 ng/mL — ABNORMAL HIGH (ref 0.10–4.00)

## 2021-10-24 NOTE — Progress Notes (Signed)
Hypoxemia! I am an Anesthesiologist, not the attending physician. ?

## 2021-10-24 NOTE — Telephone Encounter (Signed)
Critical: ?Time: 4:10  ?Received from Santiago Glad at [facility] ? ?HBG 7.8 ?HCT 22.7 ?

## 2021-10-25 DIAGNOSIS — Z4789 Encounter for other orthopedic aftercare: Secondary | ICD-10-CM | POA: Diagnosis not present

## 2021-10-25 DIAGNOSIS — S42202D Unspecified fracture of upper end of left humerus, subsequent encounter for fracture with routine healing: Secondary | ICD-10-CM | POA: Diagnosis not present

## 2021-10-25 DIAGNOSIS — Z471 Aftercare following joint replacement surgery: Secondary | ICD-10-CM | POA: Diagnosis not present

## 2021-10-25 DIAGNOSIS — Z96612 Presence of left artificial shoulder joint: Secondary | ICD-10-CM | POA: Diagnosis not present

## 2021-10-27 NOTE — Telephone Encounter (Signed)
Spoke with patient and wife Dr. [name]- patient with recent surgery likely explaining anemia ?Patient was feeling fatigue and run down but is doin better now- they were holding his BP meds and giving him Iron ?Sees [facility] on Friday and will request CBC repeat- if not we can certainly do that here ?

## 2021-11-14 ENCOUNTER — Other Ambulatory Visit (INDEPENDENT_AMBULATORY_CARE_PROVIDER_SITE_OTHER): Payer: Medicare PPO

## 2021-11-14 DIAGNOSIS — D649 Anemia, unspecified: Secondary | ICD-10-CM | POA: Diagnosis not present

## 2021-11-14 LAB — CBC WITH DIFFERENTIAL/PLATELET
Basophils Absolute: 0 10*3/uL (ref 0.0–0.1)
Basophils Relative: 0.6 % (ref 0.0–3.0)
Eosinophils Absolute: 0.1 10*3/uL (ref 0.0–0.7)
Eosinophils Relative: 3.7 % (ref 0.0–5.0)
HCT: 29.2 % — ABNORMAL LOW (ref 39.0–52.0)
Hemoglobin: 9.8 g/dL — ABNORMAL LOW (ref 13.0–17.0)
Lymphocytes Relative: 16.4 % (ref 12.0–46.0)
Lymphs Abs: 0.6 10*3/uL — ABNORMAL LOW (ref 0.7–4.0)
MCHC: 33.8 g/dL (ref 30.0–36.0)
MCV: 88.9 fl (ref 78.0–100.0)
Monocytes Absolute: 0.4 10*3/uL (ref 0.1–1.0)
Monocytes Relative: 9.9 % (ref 3.0–12.0)
Neutro Abs: 2.6 10*3/uL (ref 1.4–7.7)
Neutrophils Relative %: 69.4 % (ref 43.0–77.0)
Platelets: 219 10*3/uL (ref 150.0–400.0)
RBC: 3.28 Mil/uL — ABNORMAL LOW (ref 4.22–5.81)
RDW: 16 % — ABNORMAL HIGH (ref 11.5–15.5)
WBC: 3.8 10*3/uL — ABNORMAL LOW (ref 4.0–10.5)

## 2021-11-18 ENCOUNTER — Telehealth: Payer: Self-pay | Admitting: Family Medicine

## 2021-11-18 DIAGNOSIS — C61 Malignant neoplasm of prostate: Secondary | ICD-10-CM

## 2021-11-18 NOTE — Telephone Encounter (Signed)
Caller states she requested 3 labs to be placed and only one was done on 05/04. Lab requests sent via fax on 04/25. ? ?Still needs a CMP & PSA for Friday (05/12) appointment at [facility]. ? ?Call back information: ?[phone] ?Joline Maxcy ?

## 2021-11-18 NOTE — Telephone Encounter (Signed)
Lvm requesting pt call back to schedule lab appointment per request from [facility] for his 05/12 appt. ?

## 2021-11-18 NOTE — Telephone Encounter (Signed)
CMP and PSA ordered, ok to schedule lab visit either tomorrow or Weds.  ?

## 2021-11-19 ENCOUNTER — Other Ambulatory Visit (INDEPENDENT_AMBULATORY_CARE_PROVIDER_SITE_OTHER): Payer: Medicare PPO

## 2021-11-19 DIAGNOSIS — C61 Malignant neoplasm of prostate: Secondary | ICD-10-CM

## 2021-11-20 LAB — COMPREHENSIVE METABOLIC PANEL
ALT: 13 U/L (ref 0–53)
AST: 19 U/L (ref 0–37)
Albumin: 4 g/dL (ref 3.5–5.2)
Alkaline Phosphatase: 74 U/L (ref 39–117)
BUN: 20 mg/dL (ref 6–23)
CO2: 22 mEq/L (ref 19–32)
Calcium: 8 mg/dL — ABNORMAL LOW (ref 8.4–10.5)
Chloride: 108 mEq/L (ref 96–112)
Creatinine, Ser: 1.02 mg/dL (ref 0.40–1.50)
GFR: 73.08 mL/min (ref >60.00)
Glucose, Bld: 111 mg/dL — ABNORMAL HIGH (ref 70–99)
Potassium: 4 mEq/L (ref 3.5–5.1)
Sodium: 139 mEq/L (ref 135–145)
Total Bilirubin: 0.4 mg/dL (ref 0.2–1.2)
Total Protein: 6.2 g/dL (ref 6.0–8.3)

## 2021-11-20 LAB — CBC WITH DIFFERENTIAL/PLATELET
Basophils Absolute: 0 10*3/uL (ref 0.0–0.1)
Basophils Relative: 0.8 % (ref 0.0–3.0)
Eosinophils Absolute: 0.1 10*3/uL (ref 0.0–0.7)
Eosinophils Relative: 3 % (ref 0.0–5.0)
HCT: 28.8 % — ABNORMAL LOW (ref 39.0–52.0)
Hemoglobin: 9.9 g/dL — ABNORMAL LOW (ref 13.0–17.0)
Lymphocytes Relative: 17.6 % (ref 12.0–46.0)
Lymphs Abs: 0.7 10*3/uL (ref 0.7–4.0)
MCHC: 34.4 g/dL (ref 30.0–36.0)
MCV: 89.3 fl (ref 78.0–100.0)
Monocytes Absolute: 0.4 10*3/uL (ref 0.1–1.0)
Monocytes Relative: 10.5 % (ref 3.0–12.0)
Neutro Abs: 2.7 10*3/uL (ref 1.4–7.7)
Neutrophils Relative %: 68.1 % (ref 43.0–77.0)
Platelets: 228 10*3/uL (ref 150.0–400.0)
RBC: 3.22 Mil/uL — ABNORMAL LOW (ref 4.22–5.81)
RDW: 15.7 % — ABNORMAL HIGH (ref 11.5–15.5)
WBC: 4 10*3/uL (ref 4.0–10.5)

## 2021-11-20 LAB — PSA: PSA: 12.88 ng/mL — ABNORMAL HIGH (ref 0.10–4.00)

## 2021-11-21 DIAGNOSIS — D630 Anemia in neoplastic disease: Secondary | ICD-10-CM | POA: Diagnosis not present

## 2021-11-21 DIAGNOSIS — C61 Malignant neoplasm of prostate: Secondary | ICD-10-CM | POA: Diagnosis not present

## 2021-11-21 DIAGNOSIS — Z79899 Other long term (current) drug therapy: Secondary | ICD-10-CM | POA: Diagnosis not present

## 2021-11-21 DIAGNOSIS — C7951 Secondary malignant neoplasm of bone: Secondary | ICD-10-CM | POA: Diagnosis not present

## 2021-11-21 DIAGNOSIS — I10 Essential (primary) hypertension: Secondary | ICD-10-CM | POA: Diagnosis not present

## 2021-11-27 DIAGNOSIS — S42202D Unspecified fracture of upper end of left humerus, subsequent encounter for fracture with routine healing: Secondary | ICD-10-CM | POA: Diagnosis not present

## 2021-11-27 DIAGNOSIS — Z4789 Encounter for other orthopedic aftercare: Secondary | ICD-10-CM | POA: Diagnosis not present

## 2021-12-04 DIAGNOSIS — M25612 Stiffness of left shoulder, not elsewhere classified: Secondary | ICD-10-CM | POA: Diagnosis not present

## 2021-12-04 DIAGNOSIS — M25512 Pain in left shoulder: Secondary | ICD-10-CM | POA: Diagnosis not present

## 2021-12-05 DIAGNOSIS — C61 Malignant neoplasm of prostate: Secondary | ICD-10-CM | POA: Diagnosis not present

## 2021-12-05 DIAGNOSIS — D630 Anemia in neoplastic disease: Secondary | ICD-10-CM | POA: Diagnosis not present

## 2021-12-05 DIAGNOSIS — Z79818 Long term (current) use of other agents affecting estrogen receptors and estrogen levels: Secondary | ICD-10-CM | POA: Diagnosis not present

## 2021-12-05 DIAGNOSIS — I10 Essential (primary) hypertension: Secondary | ICD-10-CM | POA: Diagnosis not present

## 2021-12-05 DIAGNOSIS — C7951 Secondary malignant neoplasm of bone: Secondary | ICD-10-CM | POA: Diagnosis not present

## 2021-12-05 DIAGNOSIS — R972 Elevated prostate specific antigen [PSA]: Secondary | ICD-10-CM | POA: Diagnosis not present

## 2021-12-10 ENCOUNTER — Ambulatory Visit (INDEPENDENT_AMBULATORY_CARE_PROVIDER_SITE_OTHER): Payer: Medicare PPO

## 2021-12-10 DIAGNOSIS — I442 Atrioventricular block, complete: Secondary | ICD-10-CM | POA: Diagnosis not present

## 2021-12-11 DIAGNOSIS — M25512 Pain in left shoulder: Secondary | ICD-10-CM | POA: Diagnosis not present

## 2021-12-11 DIAGNOSIS — M25612 Stiffness of left shoulder, not elsewhere classified: Secondary | ICD-10-CM | POA: Diagnosis not present

## 2021-12-11 LAB — CUP PACEART REMOTE DEVICE CHECK
Battery Impedance: 2018 Ohm
Battery Remaining Longevity: 28 mo
Battery Voltage: 2.74 V
Brady Statistic AP VP Percent: 4 %
Brady Statistic AP VS Percent: 0 %
Brady Statistic AS VP Percent: 96 %
Brady Statistic AS VS Percent: 0 %
Date Time Interrogation Session: 20230531102055
Implantable Lead Implant Date: 20000731
Implantable Lead Implant Date: 20000731
Implantable Lead Location: 753859
Implantable Lead Location: 753860
Implantable Lead Model: 5092
Implantable Pulse Generator Implant Date: 20121026
Lead Channel Impedance Value: 327 Ohm
Lead Channel Impedance Value: 476 Ohm
Lead Channel Pacing Threshold Amplitude: 1 V
Lead Channel Pacing Threshold Amplitude: 1.375 V
Lead Channel Pacing Threshold Pulse Width: 0.4 ms
Lead Channel Pacing Threshold Pulse Width: 0.4 ms
Lead Channel Setting Pacing Amplitude: 2 V
Lead Channel Setting Pacing Amplitude: 2.75 V
Lead Channel Setting Pacing Pulse Width: 0.4 ms
Lead Channel Setting Sensing Sensitivity: 2.8 mV

## 2021-12-13 DIAGNOSIS — M25612 Stiffness of left shoulder, not elsewhere classified: Secondary | ICD-10-CM | POA: Diagnosis not present

## 2021-12-13 DIAGNOSIS — M25512 Pain in left shoulder: Secondary | ICD-10-CM | POA: Diagnosis not present

## 2021-12-24 ENCOUNTER — Telehealth: Payer: Self-pay | Admitting: Family Medicine

## 2021-12-24 DIAGNOSIS — C61 Malignant neoplasm of prostate: Secondary | ICD-10-CM

## 2021-12-24 NOTE — Telephone Encounter (Signed)
LVM to call back to schedule

## 2021-12-24 NOTE — Telephone Encounter (Signed)
Future labs ordered, ok to schedule lab visit.

## 2021-12-24 NOTE — Telephone Encounter (Signed)
Patient called to make a lab visit to have a CBC w/Diff, CMP, and PSA drawn. States there is a standing order from [facility]. This was last completed on 11/19/21. Upon further investigation I have not seen any new orders for labs. Patient states he has this completed monthly. Please Advise.

## 2021-12-25 ENCOUNTER — Other Ambulatory Visit (INDEPENDENT_AMBULATORY_CARE_PROVIDER_SITE_OTHER): Payer: Medicare PPO

## 2021-12-25 DIAGNOSIS — C61 Malignant neoplasm of prostate: Secondary | ICD-10-CM | POA: Diagnosis not present

## 2021-12-25 LAB — PSA: PSA: 21.74 ng/mL — ABNORMAL HIGH (ref 0.10–4.00)

## 2021-12-25 LAB — COMPREHENSIVE METABOLIC PANEL
ALT: 11 U/L (ref 0–53)
AST: 14 U/L (ref 0–37)
Albumin: 3.9 g/dL (ref 3.5–5.2)
Alkaline Phosphatase: 58 U/L (ref 39–117)
BUN: 21 mg/dL (ref 6–23)
CO2: 25 mEq/L (ref 19–32)
Calcium: 8.3 mg/dL — ABNORMAL LOW (ref 8.4–10.5)
Chloride: 107 mEq/L (ref 96–112)
Creatinine, Ser: 0.97 mg/dL (ref 0.40–1.50)
GFR: 77.57 mL/min (ref >60.00)
Glucose, Bld: 114 mg/dL — ABNORMAL HIGH (ref 70–99)
Potassium: 4.4 mEq/L (ref 3.5–5.1)
Sodium: 140 mEq/L (ref 135–145)
Total Bilirubin: 0.5 mg/dL (ref 0.2–1.2)
Total Protein: 6 g/dL (ref 6.0–8.3)

## 2021-12-25 LAB — CBC WITH DIFFERENTIAL/PLATELET
Basophils Absolute: 0 10*3/uL (ref 0.0–0.1)
Basophils Relative: 0.6 % (ref 0.0–3.0)
Eosinophils Absolute: 0.1 10*3/uL (ref 0.0–0.7)
Eosinophils Relative: 3.9 % (ref 0.0–5.0)
HCT: 30.1 % — ABNORMAL LOW (ref 39.0–52.0)
Hemoglobin: 10.3 g/dL — ABNORMAL LOW (ref 13.0–17.0)
Lymphocytes Relative: 17.8 % (ref 12.0–46.0)
Lymphs Abs: 0.6 10*3/uL — ABNORMAL LOW (ref 0.7–4.0)
MCHC: 34.3 g/dL (ref 30.0–36.0)
MCV: 87.7 fl (ref 78.0–100.0)
Monocytes Absolute: 0.3 10*3/uL (ref 0.1–1.0)
Monocytes Relative: 9.8 % (ref 3.0–12.0)
Neutro Abs: 2.4 10*3/uL (ref 1.4–7.7)
Neutrophils Relative %: 67.9 % (ref 43.0–77.0)
Platelets: 155 10*3/uL (ref 150.0–400.0)
RBC: 3.43 Mil/uL — ABNORMAL LOW (ref 4.22–5.81)
RDW: 15.2 % (ref 11.5–15.5)
WBC: 3.5 10*3/uL — ABNORMAL LOW (ref 4.0–10.5)

## 2021-12-25 NOTE — Progress Notes (Signed)
Remote pacemaker transmission.   

## 2021-12-26 DIAGNOSIS — M25512 Pain in left shoulder: Secondary | ICD-10-CM | POA: Diagnosis not present

## 2021-12-26 DIAGNOSIS — Z79818 Long term (current) use of other agents affecting estrogen receptors and estrogen levels: Secondary | ICD-10-CM | POA: Diagnosis not present

## 2021-12-26 DIAGNOSIS — C61 Malignant neoplasm of prostate: Secondary | ICD-10-CM | POA: Diagnosis not present

## 2021-12-26 DIAGNOSIS — C7951 Secondary malignant neoplasm of bone: Secondary | ICD-10-CM | POA: Diagnosis not present

## 2021-12-26 DIAGNOSIS — M25612 Stiffness of left shoulder, not elsewhere classified: Secondary | ICD-10-CM | POA: Diagnosis not present

## 2021-12-26 DIAGNOSIS — R972 Elevated prostate specific antigen [PSA]: Secondary | ICD-10-CM | POA: Diagnosis not present

## 2021-12-30 DIAGNOSIS — M25612 Stiffness of left shoulder, not elsewhere classified: Secondary | ICD-10-CM | POA: Diagnosis not present

## 2021-12-30 DIAGNOSIS — M25512 Pain in left shoulder: Secondary | ICD-10-CM | POA: Diagnosis not present

## 2021-12-31 DIAGNOSIS — C61 Malignant neoplasm of prostate: Secondary | ICD-10-CM | POA: Diagnosis not present

## 2021-12-31 DIAGNOSIS — C7951 Secondary malignant neoplasm of bone: Secondary | ICD-10-CM | POA: Diagnosis not present

## 2022-01-01 DIAGNOSIS — M25512 Pain in left shoulder: Secondary | ICD-10-CM | POA: Diagnosis not present

## 2022-01-01 DIAGNOSIS — M25612 Stiffness of left shoulder, not elsewhere classified: Secondary | ICD-10-CM | POA: Diagnosis not present

## 2022-01-03 DIAGNOSIS — M25612 Stiffness of left shoulder, not elsewhere classified: Secondary | ICD-10-CM | POA: Diagnosis not present

## 2022-01-03 DIAGNOSIS — M25512 Pain in left shoulder: Secondary | ICD-10-CM | POA: Diagnosis not present

## 2022-01-06 DIAGNOSIS — M25512 Pain in left shoulder: Secondary | ICD-10-CM | POA: Diagnosis not present

## 2022-01-06 DIAGNOSIS — M25612 Stiffness of left shoulder, not elsewhere classified: Secondary | ICD-10-CM | POA: Diagnosis not present

## 2022-01-08 DIAGNOSIS — Z4789 Encounter for other orthopedic aftercare: Secondary | ICD-10-CM | POA: Diagnosis not present

## 2022-01-08 DIAGNOSIS — R202 Paresthesia of skin: Secondary | ICD-10-CM | POA: Diagnosis not present

## 2022-01-20 DIAGNOSIS — M25512 Pain in left shoulder: Secondary | ICD-10-CM | POA: Diagnosis not present

## 2022-01-20 DIAGNOSIS — M25612 Stiffness of left shoulder, not elsewhere classified: Secondary | ICD-10-CM | POA: Diagnosis not present

## 2022-01-21 ENCOUNTER — Other Ambulatory Visit (INDEPENDENT_AMBULATORY_CARE_PROVIDER_SITE_OTHER): Payer: Medicare PPO

## 2022-01-21 ENCOUNTER — Other Ambulatory Visit: Payer: Self-pay

## 2022-01-21 DIAGNOSIS — C61 Malignant neoplasm of prostate: Secondary | ICD-10-CM

## 2022-01-21 LAB — CBC WITH DIFFERENTIAL/PLATELET
Basophils Absolute: 0 10*3/uL (ref 0.0–0.1)
Basophils Relative: 0.5 % (ref 0.0–3.0)
Eosinophils Absolute: 0.1 10*3/uL (ref 0.0–0.7)
Eosinophils Relative: 3.3 % (ref 0.0–5.0)
HCT: 31.2 % — ABNORMAL LOW (ref 39.0–52.0)
Hemoglobin: 10.6 g/dL — ABNORMAL LOW (ref 13.0–17.0)
Lymphocytes Relative: 15 % (ref 12.0–46.0)
Lymphs Abs: 0.5 10*3/uL — ABNORMAL LOW (ref 0.7–4.0)
MCHC: 34 g/dL (ref 30.0–36.0)
MCV: 88.1 fl (ref 78.0–100.0)
Monocytes Absolute: 0.3 10*3/uL (ref 0.1–1.0)
Monocytes Relative: 8.8 % (ref 3.0–12.0)
Neutro Abs: 2.5 10*3/uL (ref 1.4–7.7)
Neutrophils Relative %: 72.4 % (ref 43.0–77.0)
Platelets: 133 10*3/uL — ABNORMAL LOW (ref 150.0–400.0)
RBC: 3.54 Mil/uL — ABNORMAL LOW (ref 4.22–5.81)
RDW: 15.4 % (ref 11.5–15.5)
WBC: 3.4 10*3/uL — ABNORMAL LOW (ref 4.0–10.5)

## 2022-01-21 LAB — COMPREHENSIVE METABOLIC PANEL
ALT: 10 U/L (ref 0–53)
AST: 13 U/L (ref 0–37)
Albumin: 4 g/dL (ref 3.5–5.2)
Alkaline Phosphatase: 57 U/L (ref 39–117)
BUN: 22 mg/dL (ref 6–23)
CO2: 24 mEq/L (ref 19–32)
Calcium: 8.6 mg/dL (ref 8.4–10.5)
Chloride: 107 mEq/L (ref 96–112)
Creatinine, Ser: 0.97 mg/dL (ref 0.40–1.50)
GFR: 77.53 mL/min (ref >60.00)
Glucose, Bld: 144 mg/dL — ABNORMAL HIGH (ref 70–99)
Potassium: 4.6 mEq/L (ref 3.5–5.1)
Sodium: 139 mEq/L (ref 135–145)
Total Bilirubin: 0.4 mg/dL (ref 0.2–1.2)
Total Protein: 6.1 g/dL (ref 6.0–8.3)

## 2022-01-21 LAB — PSA: PSA: 24.71 ng/mL — ABNORMAL HIGH (ref 0.10–4.00)

## 2022-01-22 DIAGNOSIS — M25612 Stiffness of left shoulder, not elsewhere classified: Secondary | ICD-10-CM | POA: Diagnosis not present

## 2022-01-22 DIAGNOSIS — M25512 Pain in left shoulder: Secondary | ICD-10-CM | POA: Diagnosis not present

## 2022-01-24 DIAGNOSIS — M25512 Pain in left shoulder: Secondary | ICD-10-CM | POA: Diagnosis not present

## 2022-01-24 DIAGNOSIS — M25612 Stiffness of left shoulder, not elsewhere classified: Secondary | ICD-10-CM | POA: Diagnosis not present

## 2022-01-27 DIAGNOSIS — M25512 Pain in left shoulder: Secondary | ICD-10-CM | POA: Diagnosis not present

## 2022-01-27 DIAGNOSIS — M25612 Stiffness of left shoulder, not elsewhere classified: Secondary | ICD-10-CM | POA: Diagnosis not present

## 2022-01-29 DIAGNOSIS — M25512 Pain in left shoulder: Secondary | ICD-10-CM | POA: Diagnosis not present

## 2022-01-29 DIAGNOSIS — M25612 Stiffness of left shoulder, not elsewhere classified: Secondary | ICD-10-CM | POA: Diagnosis not present

## 2022-02-03 DIAGNOSIS — M25512 Pain in left shoulder: Secondary | ICD-10-CM | POA: Diagnosis not present

## 2022-02-03 DIAGNOSIS — M25612 Stiffness of left shoulder, not elsewhere classified: Secondary | ICD-10-CM | POA: Diagnosis not present

## 2022-02-04 ENCOUNTER — Other Ambulatory Visit: Payer: Self-pay

## 2022-02-04 ENCOUNTER — Other Ambulatory Visit (INDEPENDENT_AMBULATORY_CARE_PROVIDER_SITE_OTHER): Payer: Medicare PPO

## 2022-02-04 DIAGNOSIS — C61 Malignant neoplasm of prostate: Secondary | ICD-10-CM

## 2022-02-04 LAB — CBC WITH DIFFERENTIAL/PLATELET
Basophils Absolute: 0 10*3/uL (ref 0.0–0.1)
Basophils Relative: 0.4 % (ref 0.0–3.0)
Eosinophils Absolute: 0.1 10*3/uL (ref 0.0–0.7)
Eosinophils Relative: 2.8 % (ref 0.0–5.0)
HCT: 29.7 % — ABNORMAL LOW (ref 39.0–52.0)
Hemoglobin: 10.3 g/dL — ABNORMAL LOW (ref 13.0–17.0)
Lymphocytes Relative: 17.1 % (ref 12.0–46.0)
Lymphs Abs: 0.5 10*3/uL — ABNORMAL LOW (ref 0.7–4.0)
MCHC: 34.6 g/dL (ref 30.0–36.0)
MCV: 87.4 fl (ref 78.0–100.0)
Monocytes Absolute: 0.3 10*3/uL (ref 0.1–1.0)
Monocytes Relative: 10.2 % (ref 3.0–12.0)
Neutro Abs: 2 10*3/uL (ref 1.4–7.7)
Neutrophils Relative %: 69.5 % (ref 43.0–77.0)
Platelets: 89 10*3/uL — ABNORMAL LOW (ref 150.0–400.0)
RBC: 3.4 Mil/uL — ABNORMAL LOW (ref 4.22–5.81)
RDW: 14.9 % (ref 11.5–15.5)
WBC: 2.9 10*3/uL — ABNORMAL LOW (ref 4.0–10.5)

## 2022-02-04 LAB — PSA: PSA: 25.17 ng/mL — ABNORMAL HIGH (ref 0.10–4.00)

## 2022-02-04 LAB — COMPREHENSIVE METABOLIC PANEL
ALT: 10 U/L (ref 0–53)
AST: 13 U/L (ref 0–37)
Albumin: 3.9 g/dL (ref 3.5–5.2)
Alkaline Phosphatase: 62 U/L (ref 39–117)
BUN: 20 mg/dL (ref 6–23)
CO2: 25 mEq/L (ref 19–32)
Calcium: 8.5 mg/dL (ref 8.4–10.5)
Chloride: 107 mEq/L (ref 96–112)
Creatinine, Ser: 1.09 mg/dL (ref 0.40–1.50)
GFR: 67.38 mL/min (ref >60.00)
Glucose, Bld: 113 mg/dL — ABNORMAL HIGH (ref 70–99)
Potassium: 4.6 mEq/L (ref 3.5–5.1)
Sodium: 138 mEq/L (ref 135–145)
Total Bilirubin: 0.4 mg/dL (ref 0.2–1.2)
Total Protein: 6 g/dL (ref 6.0–8.3)

## 2022-02-05 ENCOUNTER — Telehealth: Payer: Self-pay | Admitting: Family Medicine

## 2022-02-05 DIAGNOSIS — M25612 Stiffness of left shoulder, not elsewhere classified: Secondary | ICD-10-CM | POA: Diagnosis not present

## 2022-02-05 DIAGNOSIS — C61 Malignant neoplasm of prostate: Secondary | ICD-10-CM

## 2022-02-05 DIAGNOSIS — M25512 Pain in left shoulder: Secondary | ICD-10-CM | POA: Diagnosis not present

## 2022-02-05 NOTE — Telephone Encounter (Signed)
Joline Maxcy from [facility] called to inform that patient had low platelets following lab work completed on 07/25. States that due to necessary platelet levels being required to receive treatments, patient will need to have these labs re-checked on 07/31. Patient is set to have a treatment completed on 08/01. Cyril Mourning states she will be faxing over the orders today.

## 2022-02-05 NOTE — Telephone Encounter (Signed)
Ok to schedule lab for 07/31. Labs ordered.

## 2022-02-07 DIAGNOSIS — Z96612 Presence of left artificial shoulder joint: Secondary | ICD-10-CM | POA: Diagnosis not present

## 2022-02-07 DIAGNOSIS — M25512 Pain in left shoulder: Secondary | ICD-10-CM | POA: Diagnosis not present

## 2022-02-07 DIAGNOSIS — M25612 Stiffness of left shoulder, not elsewhere classified: Secondary | ICD-10-CM | POA: Diagnosis not present

## 2022-02-10 ENCOUNTER — Other Ambulatory Visit (INDEPENDENT_AMBULATORY_CARE_PROVIDER_SITE_OTHER): Payer: Medicare PPO

## 2022-02-10 ENCOUNTER — Other Ambulatory Visit: Payer: Medicare PPO

## 2022-02-10 DIAGNOSIS — C61 Malignant neoplasm of prostate: Secondary | ICD-10-CM | POA: Diagnosis not present

## 2022-02-10 DIAGNOSIS — M25612 Stiffness of left shoulder, not elsewhere classified: Secondary | ICD-10-CM | POA: Diagnosis not present

## 2022-02-10 DIAGNOSIS — M25512 Pain in left shoulder: Secondary | ICD-10-CM | POA: Diagnosis not present

## 2022-02-10 LAB — CBC WITH DIFFERENTIAL/PLATELET
Basophils Absolute: 0 10*3/uL (ref 0.0–0.1)
Basophils Relative: 0.4 % (ref 0.0–3.0)
Eosinophils Absolute: 0.1 10*3/uL (ref 0.0–0.7)
Eosinophils Relative: 2.2 % (ref 0.0–5.0)
HCT: 28.8 % — ABNORMAL LOW (ref 39.0–52.0)
Hemoglobin: 9.9 g/dL — ABNORMAL LOW (ref 13.0–17.0)
Lymphocytes Relative: 15.3 % (ref 12.0–46.0)
Lymphs Abs: 0.6 10*3/uL — ABNORMAL LOW (ref 0.7–4.0)
MCHC: 34.3 g/dL (ref 30.0–36.0)
MCV: 86.9 fl (ref 78.0–100.0)
Monocytes Absolute: 0.4 10*3/uL (ref 0.1–1.0)
Monocytes Relative: 9.6 % (ref 3.0–12.0)
Neutro Abs: 2.7 10*3/uL (ref 1.4–7.7)
Neutrophils Relative %: 72.5 % (ref 43.0–77.0)
Platelets: 88 10*3/uL — ABNORMAL LOW (ref 150.0–400.0)
RBC: 3.31 Mil/uL — ABNORMAL LOW (ref 4.22–5.81)
RDW: 15.5 % (ref 11.5–15.5)
WBC: 3.7 10*3/uL — ABNORMAL LOW (ref 4.0–10.5)

## 2022-02-10 LAB — COMPREHENSIVE METABOLIC PANEL
ALT: 12 U/L (ref 0–53)
AST: 14 U/L (ref 0–37)
Albumin: 4 g/dL (ref 3.5–5.2)
Alkaline Phosphatase: 61 U/L (ref 39–117)
BUN: 20 mg/dL (ref 6–23)
CO2: 22 mEq/L (ref 19–32)
Calcium: 8.2 mg/dL — ABNORMAL LOW (ref 8.4–10.5)
Chloride: 111 mEq/L (ref 96–112)
Creatinine, Ser: 0.97 mg/dL (ref 0.40–1.50)
GFR: 77.5 mL/min (ref >60.00)
Glucose, Bld: 102 mg/dL — ABNORMAL HIGH (ref 70–99)
Potassium: 4.4 mEq/L (ref 3.5–5.1)
Sodium: 141 mEq/L (ref 135–145)
Total Bilirubin: 0.6 mg/dL (ref 0.2–1.2)
Total Protein: 6.3 g/dL (ref 6.0–8.3)

## 2022-02-11 DIAGNOSIS — R972 Elevated prostate specific antigen [PSA]: Secondary | ICD-10-CM | POA: Diagnosis not present

## 2022-02-11 DIAGNOSIS — R197 Diarrhea, unspecified: Secondary | ICD-10-CM | POA: Diagnosis not present

## 2022-02-11 DIAGNOSIS — C61 Malignant neoplasm of prostate: Secondary | ICD-10-CM | POA: Diagnosis not present

## 2022-02-11 DIAGNOSIS — Z79818 Long term (current) use of other agents affecting estrogen receptors and estrogen levels: Secondary | ICD-10-CM | POA: Diagnosis not present

## 2022-02-11 DIAGNOSIS — R5383 Other fatigue: Secondary | ICD-10-CM | POA: Diagnosis not present

## 2022-02-11 DIAGNOSIS — Z79899 Other long term (current) drug therapy: Secondary | ICD-10-CM | POA: Diagnosis not present

## 2022-02-11 DIAGNOSIS — D630 Anemia in neoplastic disease: Secondary | ICD-10-CM | POA: Diagnosis not present

## 2022-02-11 DIAGNOSIS — C7951 Secondary malignant neoplasm of bone: Secondary | ICD-10-CM | POA: Diagnosis not present

## 2022-02-11 DIAGNOSIS — I10 Essential (primary) hypertension: Secondary | ICD-10-CM | POA: Diagnosis not present

## 2022-02-17 ENCOUNTER — Other Ambulatory Visit (HOSPITAL_BASED_OUTPATIENT_CLINIC_OR_DEPARTMENT_OTHER): Payer: Self-pay

## 2022-02-17 MED ORDER — AMOXICILLIN 500 MG PO CAPS
ORAL_CAPSULE | ORAL | 0 refills | Status: AC
  Filled 2022-02-17: qty 12, 3d supply, fill #0

## 2022-02-18 DIAGNOSIS — M25512 Pain in left shoulder: Secondary | ICD-10-CM | POA: Diagnosis not present

## 2022-02-18 DIAGNOSIS — M25612 Stiffness of left shoulder, not elsewhere classified: Secondary | ICD-10-CM | POA: Diagnosis not present

## 2022-02-21 DIAGNOSIS — M25612 Stiffness of left shoulder, not elsewhere classified: Secondary | ICD-10-CM | POA: Diagnosis not present

## 2022-02-21 DIAGNOSIS — M25512 Pain in left shoulder: Secondary | ICD-10-CM | POA: Diagnosis not present

## 2022-02-25 DIAGNOSIS — M25512 Pain in left shoulder: Secondary | ICD-10-CM | POA: Diagnosis not present

## 2022-02-25 DIAGNOSIS — M25612 Stiffness of left shoulder, not elsewhere classified: Secondary | ICD-10-CM | POA: Diagnosis not present

## 2022-02-28 DIAGNOSIS — M25612 Stiffness of left shoulder, not elsewhere classified: Secondary | ICD-10-CM | POA: Diagnosis not present

## 2022-02-28 DIAGNOSIS — M25512 Pain in left shoulder: Secondary | ICD-10-CM | POA: Diagnosis not present

## 2022-03-04 ENCOUNTER — Other Ambulatory Visit: Payer: Self-pay

## 2022-03-04 ENCOUNTER — Other Ambulatory Visit (INDEPENDENT_AMBULATORY_CARE_PROVIDER_SITE_OTHER): Payer: Medicare PPO

## 2022-03-04 DIAGNOSIS — C61 Malignant neoplasm of prostate: Secondary | ICD-10-CM

## 2022-03-04 DIAGNOSIS — M25512 Pain in left shoulder: Secondary | ICD-10-CM | POA: Diagnosis not present

## 2022-03-04 DIAGNOSIS — M25612 Stiffness of left shoulder, not elsewhere classified: Secondary | ICD-10-CM | POA: Diagnosis not present

## 2022-03-04 LAB — CBC WITH DIFFERENTIAL/PLATELET
Basophils Absolute: 0 10*3/uL (ref 0.0–0.1)
Basophils Relative: 0.7 % (ref 0.0–3.0)
Eosinophils Absolute: 0 10*3/uL (ref 0.0–0.7)
Eosinophils Relative: 2 % (ref 0.0–5.0)
HCT: 26.8 % — ABNORMAL LOW (ref 39.0–52.0)
Hemoglobin: 9.1 g/dL — ABNORMAL LOW (ref 13.0–17.0)
Lymphocytes Relative: 18.5 % (ref 12.0–46.0)
Lymphs Abs: 0.4 10*3/uL — ABNORMAL LOW (ref 0.7–4.0)
MCHC: 34 g/dL (ref 30.0–36.0)
MCV: 88.4 fl (ref 78.0–100.0)
Monocytes Absolute: 0.3 10*3/uL (ref 0.1–1.0)
Monocytes Relative: 13 % — ABNORMAL HIGH (ref 3.0–12.0)
Neutro Abs: 1.4 10*3/uL (ref 1.4–7.7)
Neutrophils Relative %: 65.8 % (ref 43.0–77.0)
Platelets: 59 10*3/uL — ABNORMAL LOW (ref 150.0–400.0)
RBC: 3.03 Mil/uL — ABNORMAL LOW (ref 4.22–5.81)
RDW: 15.8 % — ABNORMAL HIGH (ref 11.5–15.5)
WBC: 2.2 10*3/uL — ABNORMAL LOW (ref 4.0–10.5)

## 2022-03-04 LAB — COMPREHENSIVE METABOLIC PANEL
ALT: 11 U/L (ref 0–53)
AST: 15 U/L (ref 0–37)
Albumin: 3.9 g/dL (ref 3.5–5.2)
Alkaline Phosphatase: 59 U/L (ref 39–117)
BUN: 23 mg/dL (ref 6–23)
CO2: 22 mEq/L (ref 19–32)
Calcium: 7.9 mg/dL — ABNORMAL LOW (ref 8.4–10.5)
Chloride: 108 mEq/L (ref 96–112)
Creatinine, Ser: 0.93 mg/dL (ref 0.40–1.50)
GFR: 81.48 mL/min (ref >60.00)
Glucose, Bld: 102 mg/dL — ABNORMAL HIGH (ref 70–99)
Potassium: 4.3 mEq/L (ref 3.5–5.1)
Sodium: 138 mEq/L (ref 135–145)
Total Bilirubin: 0.9 mg/dL (ref 0.2–1.2)
Total Protein: 5.8 g/dL — ABNORMAL LOW (ref 6.0–8.3)

## 2022-03-04 LAB — PSA: PSA: 45.67 ng/mL — ABNORMAL HIGH (ref 0.10–4.00)

## 2022-03-14 DIAGNOSIS — M25512 Pain in left shoulder: Secondary | ICD-10-CM | POA: Diagnosis not present

## 2022-03-14 DIAGNOSIS — M25612 Stiffness of left shoulder, not elsewhere classified: Secondary | ICD-10-CM | POA: Diagnosis not present

## 2022-03-18 ENCOUNTER — Other Ambulatory Visit: Payer: Self-pay

## 2022-03-18 ENCOUNTER — Other Ambulatory Visit (INDEPENDENT_AMBULATORY_CARE_PROVIDER_SITE_OTHER): Payer: Medicare PPO

## 2022-03-18 DIAGNOSIS — M25512 Pain in left shoulder: Secondary | ICD-10-CM | POA: Diagnosis not present

## 2022-03-18 DIAGNOSIS — C61 Malignant neoplasm of prostate: Secondary | ICD-10-CM | POA: Diagnosis not present

## 2022-03-18 DIAGNOSIS — M25612 Stiffness of left shoulder, not elsewhere classified: Secondary | ICD-10-CM | POA: Diagnosis not present

## 2022-03-18 LAB — COMPREHENSIVE METABOLIC PANEL
ALT: 14 U/L (ref 0–53)
AST: 16 U/L (ref 0–37)
Albumin: 3.8 g/dL (ref 3.5–5.2)
Alkaline Phosphatase: 65 U/L (ref 39–117)
BUN: 21 mg/dL (ref 6–23)
CO2: 23 mEq/L (ref 19–32)
Calcium: 7.9 mg/dL — ABNORMAL LOW (ref 8.4–10.5)
Chloride: 106 mEq/L (ref 96–112)
Creatinine, Ser: 0.96 mg/dL (ref 0.40–1.50)
GFR: 78.41 mL/min (ref >60.00)
Glucose, Bld: 108 mg/dL — ABNORMAL HIGH (ref 70–99)
Potassium: 4.1 mEq/L (ref 3.5–5.1)
Sodium: 137 mEq/L (ref 135–145)
Total Bilirubin: 0.6 mg/dL (ref 0.2–1.2)
Total Protein: 6.1 g/dL (ref 6.0–8.3)

## 2022-03-18 LAB — CBC WITH DIFFERENTIAL/PLATELET
Basophils Absolute: 0 10*3/uL (ref 0.0–0.1)
Basophils Relative: 0.9 % (ref 0.0–3.0)
Eosinophils Absolute: 0 10*3/uL (ref 0.0–0.7)
Eosinophils Relative: 1.2 % (ref 0.0–5.0)
HCT: 24.3 % — ABNORMAL LOW (ref 39.0–52.0)
Hemoglobin: 8.5 g/dL — ABNORMAL LOW (ref 13.0–17.0)
Lymphocytes Relative: 19.1 % (ref 12.0–46.0)
Lymphs Abs: 0.5 10*3/uL — ABNORMAL LOW (ref 0.7–4.0)
MCHC: 34.9 g/dL (ref 30.0–36.0)
MCV: 88.8 fl (ref 78.0–100.0)
Monocytes Absolute: 0.4 10*3/uL (ref 0.1–1.0)
Monocytes Relative: 14 % — ABNORMAL HIGH (ref 3.0–12.0)
Neutro Abs: 1.6 10*3/uL (ref 1.4–7.7)
Neutrophils Relative %: 64.8 % (ref 43.0–77.0)
Platelets: 50 10*3/uL — ABNORMAL LOW (ref 150.0–400.0)
RBC: 2.73 Mil/uL — ABNORMAL LOW (ref 4.22–5.81)
RDW: 16.1 % — ABNORMAL HIGH (ref 11.5–15.5)
WBC: 2.5 10*3/uL — ABNORMAL LOW (ref 4.0–10.5)

## 2022-03-18 LAB — PSA: PSA: 51.47 ng/mL — ABNORMAL HIGH (ref 0.10–4.00)

## 2022-03-19 DIAGNOSIS — Z96612 Presence of left artificial shoulder joint: Secondary | ICD-10-CM | POA: Diagnosis not present

## 2022-03-26 DIAGNOSIS — M25512 Pain in left shoulder: Secondary | ICD-10-CM | POA: Diagnosis not present

## 2022-03-26 DIAGNOSIS — M25612 Stiffness of left shoulder, not elsewhere classified: Secondary | ICD-10-CM | POA: Diagnosis not present

## 2022-03-27 ENCOUNTER — Telehealth: Payer: Self-pay | Admitting: Family Medicine

## 2022-03-27 ENCOUNTER — Other Ambulatory Visit (INDEPENDENT_AMBULATORY_CARE_PROVIDER_SITE_OTHER): Payer: Medicare PPO

## 2022-03-27 DIAGNOSIS — C61 Malignant neoplasm of prostate: Secondary | ICD-10-CM

## 2022-03-27 NOTE — Telephone Encounter (Signed)
Pt states [facility] sent lab orders to [facility]. He stated he is due to have these done in our office but the orders have not been placed. Please advise

## 2022-03-27 NOTE — Telephone Encounter (Signed)
Labs have been ordered, ok to schedule.

## 2022-03-28 ENCOUNTER — Telehealth: Payer: Self-pay | Admitting: *Deleted

## 2022-03-28 LAB — CBC WITH DIFFERENTIAL/PLATELET
Basophils Absolute: 0 10*3/uL (ref 0.0–0.1)
Basophils Relative: 1.5 % (ref 0.0–3.0)
Eosinophils Absolute: 0 10*3/uL (ref 0.0–0.7)
Eosinophils Relative: 0.8 % (ref 0.0–5.0)
HCT: 22.6 % — CL (ref 39.0–52.0)
Hemoglobin: 8 g/dL — CL (ref 13.0–17.0)
Lymphocytes Relative: 22.1 % (ref 12.0–46.0)
Lymphs Abs: 0.4 10*3/uL — ABNORMAL LOW (ref 0.7–4.0)
MCHC: 35.3 g/dL (ref 30.0–36.0)
MCV: 88.9 fl (ref 78.0–100.0)
Monocytes Absolute: 0.2 10*3/uL (ref 0.1–1.0)
Monocytes Relative: 11.4 % (ref 3.0–12.0)
Neutro Abs: 1.3 10*3/uL — ABNORMAL LOW (ref 1.4–7.7)
Neutrophils Relative %: 64.2 % (ref 43.0–77.0)
Platelets: 62 10*3/uL — ABNORMAL LOW (ref 150.0–400.0)
RBC: 2.55 Mil/uL — ABNORMAL LOW (ref 4.22–5.81)
RDW: 17.1 % — ABNORMAL HIGH (ref 11.5–15.5)
WBC: 2 10*3/uL — ABNORMAL LOW (ref 4.0–10.5)

## 2022-03-28 LAB — COMPREHENSIVE METABOLIC PANEL
ALT: 10 U/L (ref 0–53)
AST: 13 U/L (ref 0–37)
Albumin: 3.8 g/dL (ref 3.5–5.2)
Alkaline Phosphatase: 63 U/L (ref 39–117)
BUN: 25 mg/dL — ABNORMAL HIGH (ref 6–23)
CO2: 25 mEq/L (ref 19–32)
Calcium: 8.4 mg/dL (ref 8.4–10.5)
Chloride: 108 mEq/L (ref 96–112)
Creatinine, Ser: 1.12 mg/dL (ref 0.40–1.50)
GFR: 65.16 mL/min (ref >60.00)
Glucose, Bld: 99 mg/dL (ref 70–99)
Potassium: 4.2 mEq/L (ref 3.5–5.1)
Sodium: 141 mEq/L (ref 135–145)
Total Bilirubin: 0.6 mg/dL (ref 0.2–1.2)
Total Protein: 6.1 g/dL (ref 6.0–8.3)

## 2022-03-28 LAB — PSA: PSA: 59.82 ng/mL — ABNORMAL HIGH (ref 0.10–4.00)

## 2022-03-28 NOTE — Telephone Encounter (Signed)
This was actual note sent on secure messaging-"Keba can you let [facility] know that anemia is worsening now down to 8- also call patient and inform him- if chest pain/shortness of breath/worsening fatigue we need to get him into evaluate him- I assume this is related to treatments. any blood in stool or other sources of bleeding?" - just want to confirm completion of this

## 2022-03-28 NOTE — Telephone Encounter (Signed)
  CRITICAL VALUE: HGB 8.0 HCT: 22.6  RECEIVER (on-site recipient of call): Angelis Gates  DATE & TIME NOTIFIED: 03/28/22 @ 10:15  MESSENGER (representative from lab): [facility]  MD NOTIFIED: Dr. Yong Channel  TIME OF NOTIFICATION: 10:17  RESPONSE: received and instructed his CMA to send results to reach out to [facility].

## 2022-03-31 DIAGNOSIS — M25512 Pain in left shoulder: Secondary | ICD-10-CM | POA: Diagnosis not present

## 2022-03-31 DIAGNOSIS — M25612 Stiffness of left shoulder, not elsewhere classified: Secondary | ICD-10-CM | POA: Diagnosis not present

## 2022-03-31 NOTE — Telephone Encounter (Signed)
Reached out to pt and phones never connected, I have sent pt a mychart message since I was unable to reach him by phone.

## 2022-04-04 DIAGNOSIS — D6489 Other specified anemias: Secondary | ICD-10-CM | POA: Diagnosis not present

## 2022-04-04 DIAGNOSIS — C61 Malignant neoplasm of prostate: Secondary | ICD-10-CM | POA: Diagnosis not present

## 2022-04-04 DIAGNOSIS — D696 Thrombocytopenia, unspecified: Secondary | ICD-10-CM | POA: Diagnosis not present

## 2022-04-04 DIAGNOSIS — Z79899 Other long term (current) drug therapy: Secondary | ICD-10-CM | POA: Diagnosis not present

## 2022-04-04 DIAGNOSIS — C7951 Secondary malignant neoplasm of bone: Secondary | ICD-10-CM | POA: Diagnosis not present

## 2022-04-07 ENCOUNTER — Encounter: Payer: Self-pay | Admitting: *Deleted

## 2022-04-07 DIAGNOSIS — M25612 Stiffness of left shoulder, not elsewhere classified: Secondary | ICD-10-CM | POA: Diagnosis not present

## 2022-04-07 DIAGNOSIS — M25512 Pain in left shoulder: Secondary | ICD-10-CM | POA: Diagnosis not present

## 2022-04-20 ENCOUNTER — Telehealth: Payer: Self-pay | Admitting: Family Medicine

## 2022-04-20 MED ORDER — NIRMATRELVIR/RITONAVIR (PAXLOVID)TABLET: 3.0000 | ORAL_TABLET | Freq: Two times a day (BID) | ORAL | 0 refills | Status: AC

## 2022-04-20 NOTE — Telephone Encounter (Signed)
Positive for covid on home test. First symptom Friday. High risk patient.   Hold atorvastatin for 24 hours before dose of paxlovid. Oxycodone on list- should not be used at same time- but older rx and #20 so unlikely using

## 2022-04-23 ENCOUNTER — Ambulatory Visit (INDEPENDENT_AMBULATORY_CARE_PROVIDER_SITE_OTHER): Payer: Medicare PPO

## 2022-04-23 DIAGNOSIS — I442 Atrioventricular block, complete: Secondary | ICD-10-CM

## 2022-04-23 LAB — CUP PACEART REMOTE DEVICE CHECK
Battery Impedance: 2017 Ohm
Battery Remaining Longevity: 31 mo
Battery Voltage: 2.74 V
Brady Statistic AP VP Percent: 9 %
Brady Statistic AP VS Percent: 0 %
Brady Statistic AS VP Percent: 91 %
Brady Statistic AS VS Percent: 0 %
Date Time Interrogation Session: 20231011104153
Implantable Lead Implant Date: 20000731
Implantable Lead Implant Date: 20000731
Implantable Lead Location: 753859
Implantable Lead Location: 753860
Implantable Lead Model: 5092
Implantable Pulse Generator Implant Date: 20121026
Lead Channel Impedance Value: 328 Ohm
Lead Channel Impedance Value: 502 Ohm
Lead Channel Pacing Threshold Amplitude: 1.125 V
Lead Channel Pacing Threshold Amplitude: 1.125 V
Lead Channel Pacing Threshold Pulse Width: 0.4 ms
Lead Channel Pacing Threshold Pulse Width: 0.4 ms
Lead Channel Setting Pacing Amplitude: 2.25 V
Lead Channel Setting Pacing Amplitude: 2.5 V
Lead Channel Setting Pacing Pulse Width: 0.4 ms
Lead Channel Setting Sensing Sensitivity: 2.8 mV

## 2022-04-24 ENCOUNTER — Other Ambulatory Visit: Payer: Self-pay

## 2022-04-24 ENCOUNTER — Other Ambulatory Visit (INDEPENDENT_AMBULATORY_CARE_PROVIDER_SITE_OTHER): Payer: Medicare PPO

## 2022-04-24 DIAGNOSIS — C61 Malignant neoplasm of prostate: Secondary | ICD-10-CM

## 2022-04-24 LAB — CBC WITH DIFFERENTIAL/PLATELET
Basophils Absolute: 0 10*3/uL (ref 0.0–0.1)
Basophils Relative: 1.1 % (ref 0.0–3.0)
Eosinophils Absolute: 0 10*3/uL (ref 0.0–0.7)
Eosinophils Relative: 0.4 % (ref 0.0–5.0)
HCT: 26.6 % — ABNORMAL LOW (ref 39.0–52.0)
Hemoglobin: 9 g/dL — ABNORMAL LOW (ref 13.0–17.0)
Lymphocytes Relative: 27.5 % (ref 12.0–46.0)
Lymphs Abs: 0.6 10*3/uL — ABNORMAL LOW (ref 0.7–4.0)
MCHC: 33.8 g/dL (ref 30.0–36.0)
MCV: 89.2 fl (ref 78.0–100.0)
Monocytes Absolute: 0.4 10*3/uL (ref 0.1–1.0)
Monocytes Relative: 16.1 % — ABNORMAL HIGH (ref 3.0–12.0)
Neutro Abs: 1.2 10*3/uL — ABNORMAL LOW (ref 1.4–7.7)
Neutrophils Relative %: 54.9 % (ref 43.0–77.0)
Platelets: 77 10*3/uL — ABNORMAL LOW (ref 150.0–400.0)
RBC: 2.98 Mil/uL — ABNORMAL LOW (ref 4.22–5.81)
RDW: 17.4 % — ABNORMAL HIGH (ref 11.5–15.5)
WBC: 2.2 10*3/uL — ABNORMAL LOW (ref 4.0–10.5)

## 2022-04-24 LAB — COMPREHENSIVE METABOLIC PANEL
ALT: 16 U/L (ref 0–53)
AST: 14 U/L (ref 0–37)
Albumin: 3.9 g/dL (ref 3.5–5.2)
Alkaline Phosphatase: 78 U/L (ref 39–117)
BUN: 25 mg/dL — ABNORMAL HIGH (ref 6–23)
CO2: 22 mEq/L (ref 19–32)
Calcium: 8.3 mg/dL — ABNORMAL LOW (ref 8.4–10.5)
Chloride: 104 mEq/L (ref 96–112)
Creatinine, Ser: 1.1 mg/dL (ref 0.40–1.50)
GFR: 66.55 mL/min (ref >60.00)
Glucose, Bld: 148 mg/dL — ABNORMAL HIGH (ref 70–99)
Potassium: 4.3 mEq/L (ref 3.5–5.1)
Sodium: 136 mEq/L (ref 135–145)
Total Bilirubin: 0.5 mg/dL (ref 0.2–1.2)
Total Protein: 6.5 g/dL (ref 6.0–8.3)

## 2022-04-24 LAB — PSA: PSA: 173 ng/mL — ABNORMAL HIGH (ref 0.10–4.00)

## 2022-04-30 DIAGNOSIS — R339 Retention of urine, unspecified: Secondary | ICD-10-CM | POA: Diagnosis not present

## 2022-05-01 ENCOUNTER — Other Ambulatory Visit: Payer: Self-pay

## 2022-05-01 ENCOUNTER — Encounter (HOSPITAL_BASED_OUTPATIENT_CLINIC_OR_DEPARTMENT_OTHER): Payer: Self-pay

## 2022-05-01 ENCOUNTER — Emergency Department (HOSPITAL_BASED_OUTPATIENT_CLINIC_OR_DEPARTMENT_OTHER)
Admission: EM | Admit: 2022-05-01 | Discharge: 2022-05-01 | Disposition: A | Discharge status: Home / Self Care | Payer: Medicare PPO | Attending: Emergency Medicine | Admitting: Emergency Medicine

## 2022-05-01 ENCOUNTER — Other Ambulatory Visit (HOSPITAL_BASED_OUTPATIENT_CLINIC_OR_DEPARTMENT_OTHER): Payer: Self-pay

## 2022-05-01 DIAGNOSIS — Z23 Encounter for immunization: Secondary | ICD-10-CM | POA: Diagnosis not present

## 2022-05-01 DIAGNOSIS — C61 Malignant neoplasm of prostate: Secondary | ICD-10-CM | POA: Diagnosis not present

## 2022-05-01 DIAGNOSIS — R338 Other retention of urine: Secondary | ICD-10-CM

## 2022-05-01 DIAGNOSIS — R310 Gross hematuria: Secondary | ICD-10-CM | POA: Diagnosis not present

## 2022-05-01 DIAGNOSIS — D696 Thrombocytopenia, unspecified: Secondary | ICD-10-CM | POA: Diagnosis not present

## 2022-05-01 DIAGNOSIS — N320 Bladder-neck obstruction: Secondary | ICD-10-CM | POA: Diagnosis not present

## 2022-05-01 DIAGNOSIS — D61818 Other pancytopenia: Secondary | ICD-10-CM | POA: Diagnosis not present

## 2022-05-01 DIAGNOSIS — R972 Elevated prostate specific antigen [PSA]: Secondary | ICD-10-CM | POA: Diagnosis not present

## 2022-05-01 DIAGNOSIS — Z79899 Other long term (current) drug therapy: Secondary | ICD-10-CM | POA: Diagnosis not present

## 2022-05-01 DIAGNOSIS — C7951 Secondary malignant neoplasm of bone: Secondary | ICD-10-CM | POA: Diagnosis not present

## 2022-05-01 LAB — URINALYSIS, ROUTINE W REFLEX MICROSCOPIC
Bilirubin Urine: NEGATIVE
Glucose, UA: NEGATIVE mg/dL
Ketones, ur: NEGATIVE mg/dL
Leukocytes,Ua: NEGATIVE
Nitrite: NEGATIVE
Protein, ur: 100 mg/dL — AB
RBC / HPF: >50 RBC/hpf — ABNORMAL HIGH (ref 0–5)
Specific Gravity, Urine: 1.019 (ref 1.005–1.030)
WBC, UA: >50 WBC/hpf — ABNORMAL HIGH (ref 0–5)
pH: 5.5 (ref 5.0–8.0)

## 2022-05-01 MED ORDER — PREDNISONE 10 MG PO TABS
10.0000 mg | ORAL_TABLET | Freq: Every morning | ORAL | 11 refills | Status: AC
  Filled 2022-05-01 – 2022-05-14 (×2): qty 30, 30d supply, fill #0
  Filled 2022-06-09: qty 30, 30d supply, fill #1

## 2022-05-01 MED ORDER — PROCHLORPERAZINE MALEATE 10 MG PO TABS
10.0000 mg | ORAL_TABLET | Freq: Four times a day (QID) | ORAL | 8 refills | Status: AC | PRN
  Filled 2022-05-01 – 2022-05-14 (×2): qty 30, 8d supply, fill #0

## 2022-05-01 NOTE — Discharge Instructions (Signed)
Please return for fever or if you are not passing urine through the catheter.

## 2022-05-01 NOTE — ED Notes (Signed)
Bladder scan reading 398.

## 2022-05-01 NOTE — ED Provider Notes (Signed)
[name]   CSN: [id number] Arrival date & time: 04/30/22  2353     History  Chief Complaint  Patient presents with   Urinary Retention    Devin Mercado is a 71 y.o. male.  71 yo M with a chief complaint of difficulty urinating.  This has been off and on for him.  He has an appointment with urology tomorrow.  About 5 or 6 PM he developed an inability to urinate.        Home Medications Prior to Admission medications   Medication Sig Start Date End Date Taking? Authorizing Provider  amoxicillin (AMOXIL) 500 MG capsule Take 4 capsules by mouth 1 hour prior to dental appointment for pre-medication. 02/17/22     atorvastatin (LIPITOR) 20 MG tablet Take 1 tablet by mouth once daily 07/29/21   [name], MD  calcium carbonate (OS-CAL - DOSED IN MG OF ELEMENTAL CALCIUM) 1250 (500 Ca) MG tablet Take 1 tablet by mouth daily.    [provider]  cyclobenzaprine (FLEXERIL) 10 MG tablet 1 q 6-8 hrs prn spasm 10/16/21   Shuford, [name], PA-C  denosumab (XGEVA) 120 MG/1.7ML SOLN injection Inject 120 mg into the skin every 2 (two) months.    [provider]  ferrous sulfate 325 (65 FE) MG tablet Take 325 mg by mouth daily with breakfast.    [provider]  leuprolide, 6 Month, (ELIGARD) 45 MG injection Inject 45 mg into the skin every 6 (six) months.    [provider]  lutetium Lu 177 Vipivotide Tet (PLUVICTO) 1000 MBQ/ML SOLN Inject into the vein every 6 (six) weeks.    [provider]  oxyCODONE-acetaminophen (PERCOCET) 5-325 MG tablet 1 q 4-6 hrs prn pain 10/16/21   Shuford, [name], PA-C  valsartan (DIOVAN) 160 MG tablet Take 1 tablet (160 mg total) by mouth daily. 10/10/21   [name], MD      Allergies    Patient has no known allergies.    Review of Systems   Review of Systems  Physical Exam Updated Vital Signs BP 131/77 (BP Location: Right Arm)   Pulse 78   Temp 98.9 F (37.2  C) (Oral)   Resp 16   Ht 6' (1.829 m)   Wt 104.3 kg   SpO2 98%   BMI 31.19 kg/m  Physical Exam Vitals and nursing note reviewed.  Constitutional:      Appearance: He is well-developed.  HENT:     Head: Normocephalic and atraumatic.  Eyes:     Pupils: Pupils are equal, round, and reactive to light.  Neck:     Vascular: No JVD.  Cardiovascular:     Rate and Rhythm: Normal rate and regular rhythm.     Heart sounds: No murmur heard.    No friction rub. No gallop.  Pulmonary:     Effort: No respiratory distress.     Breath sounds: No wheezing.  Abdominal:     General: There is no distension.     Tenderness: There is no abdominal tenderness. There is no guarding or rebound.     Comments: Painful palpable bladder  Musculoskeletal:        General: Normal range of motion.     Cervical back: Normal range of motion and neck supple.  Skin:    Coloration: Skin is not pale.     Findings: No rash.  Neurological:     Mental Status: He is alert and oriented to person,  place, and time.  Psychiatric:        Behavior: Behavior normal.     ED Results / Procedures / Treatments   Labs (all labs ordered are listed, but only abnormal results are displayed) Labs Reviewed  URINALYSIS, ROUTINE W REFLEX MICROSCOPIC - Abnormal; Notable for the following components:      Result Value   Color, Urine BROWN (*)    APPearance CLOUDY (*)    Hgb urine dipstick LARGE (*)    Protein, ur 100 (*)    RBC / HPF >50 (*)    WBC, UA >50 (*)    Bacteria, UA FEW (*)    All other components within normal limits    EKG None  Radiology No results found.  Procedures Procedures    Medications Ordered in ED Medications - No data to display  ED Course/ Medical Decision Making/ A&P                           Medical Decision Making Amount and/or Complexity of Data Reviewed Labs: ordered.   71 yo M with a chief complaints of acute urinary retention.  Patient has a history of prostate cancer on  my record review.  Scheduled to see his urologist tomorrow.  Developed an inability urinate about 6 hours ago.  Bladder scan with greater than 300 cc urine.  Will place Foley.  Reassess.  Foley placed without issue.  UA with large whites and reds with few bacteria.  We will hold off on antibiotics at this time.  He has follow-up with his urologist in the morning.  2:30 AM:  I have discussed the diagnosis/risks/treatment options with the patient and family.  Evaluation and diagnostic testing in the emergency department does not suggest an emergent condition requiring admission or immediate intervention beyond what has been performed at this time.  They will follow up with Urology. We also discussed returning to the ED immediately if new or worsening sx occur. We discussed the sx which are most concerning (e.g., sudden worsening pain, fever, inability to tolerate by mouth, inability to urinate) that necessitate immediate return. Medications administered to the patient during their visit and any new prescriptions provided to the patient are listed below.  Medications given during this visit Medications - No data to display   The patient appears reasonably screen and/or stabilized for discharge and I doubt any other medical condition or other [facility] requiring further screening, evaluation, or treatment in the ED at this time prior to discharge.          Final Clinical Impression(s) / ED Diagnoses Final diagnoses:  Acute urinary retention    Rx / DC Orders ED Discharge Orders     None         [name], DO 05/01/22 0230

## 2022-05-01 NOTE — ED Triage Notes (Signed)
States he has not been able to urinate since around 6 pm.  This is the 2nd time this has happened with the first time being 04/15/2022. Has urology appt tomorrow.

## 2022-05-02 DIAGNOSIS — R310 Gross hematuria: Secondary | ICD-10-CM | POA: Diagnosis not present

## 2022-05-02 DIAGNOSIS — R338 Other retention of urine: Secondary | ICD-10-CM | POA: Diagnosis not present

## 2022-05-05 ENCOUNTER — Inpatient Hospital Stay (HOSPITAL_COMMUNITY)
Admission: EM | Admit: 2022-05-05 | Discharge: 2022-05-09 | DRG: 698 | Disposition: A | Discharge status: Home / Self Care | Payer: Medicare PPO | Attending: Internal Medicine | Admitting: Internal Medicine

## 2022-05-05 ENCOUNTER — Encounter (HOSPITAL_COMMUNITY): Payer: Self-pay

## 2022-05-05 DIAGNOSIS — Z823 Family history of stroke: Secondary | ICD-10-CM | POA: Diagnosis not present

## 2022-05-05 DIAGNOSIS — Z96643 Presence of artificial hip joint, bilateral: Secondary | ICD-10-CM | POA: Diagnosis present

## 2022-05-05 DIAGNOSIS — Z8249 Family history of ischemic heart disease and other diseases of the circulatory system: Secondary | ICD-10-CM | POA: Diagnosis not present

## 2022-05-05 DIAGNOSIS — Z923 Personal history of irradiation: Secondary | ICD-10-CM | POA: Diagnosis not present

## 2022-05-05 DIAGNOSIS — R338 Other retention of urine: Secondary | ICD-10-CM | POA: Diagnosis present

## 2022-05-05 DIAGNOSIS — I10 Essential (primary) hypertension: Secondary | ICD-10-CM | POA: Diagnosis present

## 2022-05-05 DIAGNOSIS — C7951 Secondary malignant neoplasm of bone: Secondary | ICD-10-CM | POA: Diagnosis present

## 2022-05-05 DIAGNOSIS — I472 Ventricular tachycardia, unspecified: Secondary | ICD-10-CM | POA: Diagnosis present

## 2022-05-05 DIAGNOSIS — Z950 Presence of cardiac pacemaker: Secondary | ICD-10-CM | POA: Diagnosis not present

## 2022-05-05 DIAGNOSIS — D61810 Antineoplastic chemotherapy induced pancytopenia: Secondary | ICD-10-CM | POA: Diagnosis present

## 2022-05-05 DIAGNOSIS — R310 Gross hematuria: Secondary | ICD-10-CM | POA: Diagnosis present

## 2022-05-05 DIAGNOSIS — D509 Iron deficiency anemia, unspecified: Secondary | ICD-10-CM | POA: Diagnosis not present

## 2022-05-05 DIAGNOSIS — Z8546 Personal history of malignant neoplasm of prostate: Secondary | ICD-10-CM

## 2022-05-05 DIAGNOSIS — Z960 Presence of urogenital implants: Secondary | ICD-10-CM | POA: Diagnosis present

## 2022-05-05 DIAGNOSIS — R531 Weakness: Secondary | ICD-10-CM | POA: Diagnosis present

## 2022-05-05 DIAGNOSIS — Z79899 Other long term (current) drug therapy: Secondary | ICD-10-CM

## 2022-05-05 DIAGNOSIS — N3041 Irradiation cystitis with hematuria: Secondary | ICD-10-CM | POA: Diagnosis present

## 2022-05-05 DIAGNOSIS — Y842 Radiological procedure and radiotherapy as the cause of abnormal reaction of the patient, or of later complication, without mention of misadventure at the time of the procedure: Secondary | ICD-10-CM | POA: Diagnosis present

## 2022-05-05 DIAGNOSIS — C61 Malignant neoplasm of prostate: Secondary | ICD-10-CM | POA: Diagnosis not present

## 2022-05-05 DIAGNOSIS — M1990 Unspecified osteoarthritis, unspecified site: Secondary | ICD-10-CM | POA: Diagnosis present

## 2022-05-05 DIAGNOSIS — N421 Congestion and hemorrhage of prostate: Secondary | ICD-10-CM | POA: Diagnosis present

## 2022-05-05 DIAGNOSIS — E785 Hyperlipidemia, unspecified: Secondary | ICD-10-CM | POA: Diagnosis present

## 2022-05-05 DIAGNOSIS — Z8042 Family history of malignant neoplasm of prostate: Secondary | ICD-10-CM | POA: Diagnosis not present

## 2022-05-05 DIAGNOSIS — I442 Atrioventricular block, complete: Secondary | ICD-10-CM | POA: Diagnosis present

## 2022-05-05 DIAGNOSIS — D649 Anemia, unspecified: Secondary | ICD-10-CM | POA: Diagnosis not present

## 2022-05-05 DIAGNOSIS — Z96612 Presence of left artificial shoulder joint: Secondary | ICD-10-CM | POA: Diagnosis present

## 2022-05-05 DIAGNOSIS — D62 Acute posthemorrhagic anemia: Secondary | ICD-10-CM | POA: Diagnosis present

## 2022-05-05 DIAGNOSIS — T451X5A Adverse effect of antineoplastic and immunosuppressive drugs, initial encounter: Secondary | ICD-10-CM | POA: Diagnosis present

## 2022-05-05 LAB — CBC WITH DIFFERENTIAL/PLATELET
Abs Immature Granulocytes: 0.2 10*3/uL — ABNORMAL HIGH (ref 0.00–0.07)
Basophils Absolute: 0 10*3/uL (ref 0.0–0.1)
Basophils Relative: 1 %
Eosinophils Absolute: 0 10*3/uL (ref 0.0–0.5)
Eosinophils Relative: 1 %
HCT: 19.2 % — ABNORMAL LOW (ref 39.0–52.0)
Hemoglobin: 6.5 g/dL — CL (ref 13.0–17.0)
Immature Granulocytes: 7 %
Lymphocytes Relative: 16 %
Lymphs Abs: 0.4 10*3/uL — ABNORMAL LOW (ref 0.7–4.0)
MCH: 30.7 pg (ref 26.0–34.0)
MCHC: 33.9 g/dL (ref 30.0–36.0)
MCV: 90.6 fL (ref 80.0–100.0)
Monocytes Absolute: 0.5 10*3/uL (ref 0.1–1.0)
Monocytes Relative: 20 %
Neutro Abs: 1.5 10*3/uL — ABNORMAL LOW (ref 1.7–7.7)
Neutrophils Relative %: 55 %
Platelets: 64 10*3/uL — ABNORMAL LOW (ref 150–400)
RBC: 2.12 MIL/uL — ABNORMAL LOW (ref 4.22–5.81)
RDW: 15.7 % — ABNORMAL HIGH (ref 11.5–15.5)
WBC: 2.7 10*3/uL — ABNORMAL LOW (ref 4.0–10.5)
nRBC: 1.5 % — ABNORMAL HIGH (ref 0.0–0.2)

## 2022-05-05 LAB — BASIC METABOLIC PANEL
Anion gap: 9 (ref 5–15)
BUN: 28 mg/dL — ABNORMAL HIGH (ref 8–23)
CO2: 22 mmol/L (ref 22–32)
Calcium: 7.6 mg/dL — ABNORMAL LOW (ref 8.9–10.3)
Chloride: 103 mmol/L (ref 98–111)
Creatinine, Ser: 1.2 mg/dL (ref 0.61–1.24)
GFR, Estimated: >60 mL/min (ref >60)
Glucose, Bld: 101 mg/dL — ABNORMAL HIGH (ref 70–99)
Potassium: 4.1 mmol/L (ref 3.5–5.1)
Sodium: 134 mmol/L — ABNORMAL LOW (ref 135–145)

## 2022-05-05 LAB — URINALYSIS, ROUTINE W REFLEX MICROSCOPIC
RBC / HPF: >50 RBC/hpf — ABNORMAL HIGH (ref 0–5)
WBC, UA: >50 WBC/hpf — ABNORMAL HIGH (ref 0–5)

## 2022-05-05 LAB — LIPASE, BLOOD: Lipase: 29 U/L (ref 11–51)

## 2022-05-05 LAB — PREPARE RBC (CROSSMATCH)

## 2022-05-05 MED ORDER — SODIUM CHLORIDE 0.9 % IR SOLN
3000.0000 mL | Status: DC
  Administered 2022-05-05 (×2): 3000 mL

## 2022-05-05 MED ORDER — SODIUM CHLORIDE 0.9 % IV SOLN
10.0000 mL/h | Freq: Once | INTRAVENOUS | Status: AC
  Administered 2022-05-05: 10 mL/h via INTRAVENOUS

## 2022-05-05 NOTE — H&P (Signed)
History and Physical    [name]:[id number] DOB: 1951/03/23 DOA: 05/05/2022  PCP: [name], MD  Patient coming from: Home by urology office  I have personally briefly reviewed patient's old medical records available.   Chief Complaint: Weakness, blood noticed in catheter  HPI: Devin Mercado is a 71 y.o. male with medical history significant of essential hypertension, metastatic prostate cancer mets to bones castrate resistant, radiation cystitis, recurrent urinary retention and currently placed with a Foley catheter went to urology office today and was complaining of weakness. Patient was seen in the emergency room 4 days ago, he had a Foley placed for clot retention and was discharged home.  Today he went to clinic and was seen very weak and tired.  Blood work done showed hemoglobin of 6.5, Foley noticed with hematuria so sent to ER.  Urology seeing patient in the emergency room. Patient denies any fever chills nausea vomiting headache.  He tells me he is overall weak.  He has some difficulty due to Foley catheter in place. ED Course: Hemodynamically stable.  Hemoglobin 6.5.  Hemoglobin was 9 about 10 days ago.  Hemodynamically stable.  Blood pressure stable.  1 unit PRBC transfusion was started.  CBI started.  Seen by urology.  Admission requested.  Review of Systems: all systems are reviewed and pertinent positive as per HPI otherwise rest are negative.    Past Medical History:  Diagnosis Date   Arthritis    OA AND PAIN LEFT HIP   CHB (complete heart block) ([place])    a. MDT dual chamber pacemaker   HTN (hypertension)    Hyperlipidemia    Presence of permanent cardiac pacemaker    2000   Prostate cancer ([place]) 09/2014    Past Surgical History:  Procedure Laterality Date   ADENOIDECTOMY AS A CHILD     COLONOSCOPY WITH PROPOFOL N/A 12/30/2017   Procedure: COLONOSCOPY WITH PROPOFOL;  Surgeon: Ladene Artist, MD;  Location: [facility];  Service: Endoscopy;   Laterality: N/A;   JOINT REPLACEMENT     Right hip replacement Dr. Alvan Dame 01-05-18   LYMPHADENECTOMY Bilateral 11/06/2014   Procedure: PELVIC LYMPHADENECTOMY;  Surgeon: [name], MD;  Location: [facility];  Service: Urology;  Laterality: Bilateral;   PACEMAKER INSERTION  2000; 2012   MDT dual chamber pacemaker implanted 2000 with gen change 2012   POLYPECTOMY  12/30/2017   Procedure: POLYPECTOMY;  Surgeon: Ladene Artist, MD;  [facility];  Service: Endoscopy;;   PROSTATE BIOPSY  09/05/14   REVERSE SHOULDER ARTHROPLASTY Left 10/15/2021   Procedure: REVERSE SHOULDER ARTHROPLASTY;  Surgeon: Justice Britain, MD;  Location: [facility];  Service: Orthopedics;  Laterality: Left;   ROBOT ASSISTED LAPAROSCOPIC RADICAL PROSTATECTOMY N/A 11/06/2014   Procedure: ROBOTIC ASSISTED LAPAROSCOPIC RADICAL PROSTATECTOMY LEVEL 2;  Surgeon: [name], MD;  [facility];  Service: Urology;  Laterality: N/A;   TOTAL HIP ARTHROPLASTY Left 07/03/2014   Procedure: LEFT TOTAL HIP ARTHROPLASTY ANTERIOR APPROACH;  Surgeon: [name], MD;  [facility];  Service: Orthopedics;  Laterality: Left;   TOTAL HIP ARTHROPLASTY Right 01/05/2018   Procedure: RIGHT TOTAL HIP ARTHROPLASTY ANTERIOR APPROACH;  Surgeon: Paralee Cancel, MD;  Location: [facility];  Service: Orthopedics;  Laterality: Right;  70 mins   WISDOM TEETH EXTRACTIONS      Social history   reports that he has never smoked. He has never used smokeless tobacco. He reports current alcohol use of about 2.0 - 3.0 standard drinks of  alcohol per week. He reports that he does not use drugs.  No Known Allergies  Family History  Problem Relation Age of Onset   Stroke Mother        multiple. 35.    Heart disease Mother        hx heart valve problem   Prostate cancer Father        83 of prostate cancer   Heart attack Neg Hx    Hypertension Neg Hx      Prior to Admission medications   Medication Sig Start Date End Date Taking? Authorizing Provider   acetaminophen (TYLENOL) 325 MG tablet Take 650 mg by mouth every 6 (six) hours as needed for mild pain or headache.   Yes [provider]  amoxicillin (AMOXIL) 500 MG capsule Take 4 capsules by mouth 1 hour prior to dental appointment for pre-medication. Patient taking differently: Take 2,000 mg by mouth See admin instructions. Take 2,000 mg by mouth one hour prior to dental appointments for pre-medication 02/17/22  Yes   atorvastatin (LIPITOR) 20 MG tablet Take 1 tablet by mouth once daily Patient taking differently: Take 20 mg by mouth daily. 07/29/21  Yes [name], MD  calcium carbonate (OS-CAL - DOSED IN MG OF ELEMENTAL CALCIUM) 1250 (500 Ca) MG tablet Take 1 tablet by mouth 2 (two) times a week.   Yes [provider]  denosumab (XGEVA) 120 MG/1.7ML SOLN injection Inject 120 mg into the skin every 2 (two) months.   Yes [provider]  ferrous sulfate 325 (65 FE) MG tablet Take 325 mg by mouth daily with breakfast.   Yes [provider]  ibuprofen (ADVIL) 200 MG tablet Take 400 mg by mouth every 6 (six) hours as needed for mild pain or headache.   Yes [provider]  leuprolide, 6 Month, (ELIGARD) 45 MG injection Inject 45 mg into the skin every 6 (six) months.   Yes [provider]  valsartan (DIOVAN) 160 MG tablet Take 1 tablet (160 mg total) by mouth daily. 10/10/21  Yes [name], MD  cyclobenzaprine (FLEXERIL) 10 MG tablet 1 q 6-8 hrs prn spasm Patient not taking: Reported on 05/05/2022 10/16/21   Shuford, [name], PA-C  oxyCODONE-acetaminophen (PERCOCET) 5-325 MG tablet 1 q 4-6 hrs prn pain Patient not taking: Reported on 05/05/2022 10/16/21   Shuford, [name], PA-C  predniSONE (DELTASONE) 10 MG tablet Take 1 tablet (10 mg total) by mouth every morning. Patient not taking: Reported on 05/05/2022 05/01/22     prochlorperazine (COMPAZINE) 10 MG tablet Take 1 tablet (10 mg total) by mouth every 6 (six) hours as needed as needed for  nausea or vomiting. Patient not taking: Reported on 05/05/2022 05/01/22       Physical Exam: Vitals:   05/05/22 1102 05/05/22 1230 05/05/22 1430 05/05/22 1500  BP: (!) 147/78 128/72 130/67 132/64  Pulse: 98 79 74 72  Resp: '18 18 17 17  '$ Temp: 98.9 F (37.2 C)     TempSrc: Oral     SpO2: 100% 100% 100% 100%  Weight: 102.5 kg     Height: 6' (1.829 m)       Constitutional: NAD, calm, comfortable Vitals:   05/05/22 1102 05/05/22 1230 05/05/22 1430 05/05/22 1500  BP: (!) 147/78 128/72 130/67 132/64  Pulse: 98 79 74 72  Resp: '18 18 17 17  '$ Temp: 98.9 F (37.2 C)     TempSrc: Oral     SpO2: 100% 100% 100% 100%  Weight:  102.5 kg     Height: 6' (1.829 m)      Eyes: PERRL, lids and conjunctivae normal ENMT: Mucous membranes are moist. Posterior pharynx clear of any exudate or lesions.Normal dentition.  Neck: normal, supple, no masses, no thyromegaly Respiratory: clear to auscultation bilaterally, no wheezing, no crackles. Normal respiratory effort. No accessory muscle use.  Cardiovascular: Regular rate and rhythm, no murmurs / rubs / gallops. No extremity edema. 2+ pedal pulses. No carotid bruits.  Pacemaker left precordium present. Abdomen: no tenderness, no masses palpated. No hepatosplenomegaly. Bowel sounds positive.  Musculoskeletal: no clubbing / cyanosis. No joint deformity upper and lower extremities. Good ROM, no contractures. Normal muscle tone.  Skin: no rashes, lesions, ulcers. No induration Neurologic: CN 2-12 grossly intact. Sensation intact, DTR normal. Strength 5/5 in all 4.  Psychiatric: Normal judgment and insight. Alert and oriented x 3. Normal mood.   Foley catheter with blood-tinged urine, flowing freely to gravity.    Labs on Admission: I have personally reviewed following labs and imaging studies  CBC: Recent Labs  Lab 05/05/22 1224  WBC 2.7*  NEUTROABS 1.5*  HGB 6.5*  HCT 19.2*  MCV 90.6  PLT 64*   Basic Metabolic Panel: Recent Labs  Lab  05/05/22 1224  NA 134*  K 4.1  CL 103  CO2 22  GLUCOSE 101*  BUN 28*  CREATININE 1.20  CALCIUM 7.6*   GFR: Estimated Creatinine Clearance: 67.9 mL/min (by C-G formula based on SCr of 1.2 mg/dL). Liver Function Tests: No results for input(s): "AST", "ALT", "ALKPHOS", "BILITOT", "PROT", "ALBUMIN" in the last 168 hours. Recent Labs  Lab 05/05/22 1224  LIPASE 29   No results for input(s): "AMMONIA" in the last 168 hours. Coagulation Profile: No results for input(s): "INR", "PROTIME" in the last 168 hours. Cardiac Enzymes: No results for input(s): "CKTOTAL", "CKMB", "CKMBINDEX", "TROPONINI" in the last 168 hours. BNP (last 3 results) No results for input(s): "PROBNP" in the last 8760 hours. HbA1C: No results for input(s): "HGBA1C" in the last 72 hours. CBG: No results for input(s): "GLUCAP" in the last 168 hours. Lipid Profile: No results for input(s): "CHOL", "HDL", "LDLCALC", "TRIG", "CHOLHDL", "LDLDIRECT" in the last 72 hours. Thyroid Function Tests: No results for input(s): "TSH", "T4TOTAL", "FREET4", "T3FREE", "THYROIDAB" in the last 72 hours. Anemia Panel: No results for input(s): "VITAMINB12", "FOLATE", "FERRITIN", "TIBC", "IRON", "RETICCTPCT" in the last 72 hours. Urine analysis:    Component Value Date/Time   COLORURINE BROWN (A) 05/01/2022 0020   APPEARANCEUR CLOUDY (A) 05/01/2022 0020   LABSPEC 1.019 05/01/2022 0020   PHURINE 5.5 05/01/2022 0020   GLUCOSEU NEGATIVE 05/01/2022 0020   GLUCOSEU NEGATIVE 03/06/2009 0819   HGBUR LARGE (A) 05/01/2022 0020   HGBUR trace-lysed 10/05/2007 0823   BILIRUBINUR NEGATIVE 05/01/2022 0020   BILIRUBINUR neg 03/15/2020 0831   KETONESUR NEGATIVE 05/01/2022 0020   PROTEINUR 100 (A) 05/01/2022 0020   UROBILINOGEN 0.2 03/15/2020 0831   UROBILINOGEN 0.2 06/26/2014 1115   NITRITE NEGATIVE 05/01/2022 0020   LEUKOCYTESUR NEGATIVE 05/01/2022 0020    Radiological Exams on Admission: No results  found.   Assessment/Plan Principal Problem:   Symptomatic anemia Active Problems:   Hyperlipidemia   Essential hypertension   Atrioventricular block, complete (HCC)   Prostate cancer (HCC)   Gross hematuria     1.  Acute blood loss anemia secondary to bleeding from prostate bed/hemorrhagic cystitis in a patient with metastatic prostate cancer. -Hemoglobin 6.5-monitor PRBC transfusion-recheck and transfuse to keep more than 8 given recurrent bleeding. -  Continue CBI with protocol to return clear urine.  Urology is following.  Probable cystoscopy tomorrow. Reported CT scan abdomen pelvis at [facility] with no new findings or obstruction as per urology reports.  2.  Essential hypertension: Blood pressure stable.  Resume home medications.  3.  Hyperlipidemia: On Lipitor 20.  Continue.  4.  AV block status post pacemaker: Stable.   DVT prophylaxis: SCDs Code Status: Full code Family Communication: None at the bedside. Disposition Plan: Home Consults called: Urology on board Admission status: Observation.  Will be evaluated for need of admission on every day rounding.   Barb Merino MD [facility] Pager [phone]

## 2022-05-05 NOTE — ED Triage Notes (Signed)
Pt arrives POV from [facility]. Pt had bloodwork at [facility] this morning, got a shot of rocephin. Hgb 3 days ago was 8.2, afebrile, endorses fatigue. Seen at [facility] for hematuria. Fever and increased bleeding since Saturday as well as increased fatigue. Hx transfusion. Foley in place.

## 2022-05-05 NOTE — Consult Note (Signed)
Urology Consult   Reason for consult: hematuria  History of Present Illness: Devin Mercado is a 71 y.o. who presented to the ED with hematuria and anemia  Devin Mercado is known to our clinic due to a history of metastatic prostate cancer and radiation cystitis. Thursday he had a foley placed for clot retention and he was seen in our clinic the next day. He returned to our clinic today and was felt to be acutely ill and was sent to the ED.  In the ED, his labs are notable for H/H 6.5/ 19.2  Devin Mercado endorses weakness. He reports Thursday he was passing clots. He has had persistent hematuria since then - sometimes light red, sometimes darker  He reportedly had a CT scan with contrast at [facility] 10/19 which was unremarkable.   Past Medical History:  Diagnosis Date   Arthritis    OA AND PAIN LEFT HIP   CHB (complete heart block) ([place])    a. MDT dual chamber pacemaker   HTN (hypertension)    Hyperlipidemia    Presence of permanent cardiac pacemaker    2000   Prostate cancer ([place]) 09/2014    Past Surgical History:  Procedure Laterality Date   ADENOIDECTOMY AS A CHILD     COLONOSCOPY WITH PROPOFOL N/A 12/30/2017   Procedure: COLONOSCOPY WITH PROPOFOL;  Surgeon: Ladene Artist, MD;  [facility];  Service: Endoscopy;  Laterality: N/A;   JOINT REPLACEMENT     Right hip replacement Dr. Alvan Dame 01-05-18   LYMPHADENECTOMY Bilateral 11/06/2014   Procedure: PELVIC LYMPHADENECTOMY;  Surgeon: [name], MD;  [facility];  Service: Urology;  Laterality: Bilateral;   PACEMAKER INSERTION  2000; 2012   MDT dual chamber pacemaker implanted 2000 with gen change 2012   POLYPECTOMY  12/30/2017   Procedure: POLYPECTOMY;  Surgeon: Ladene Artist, MD;  [facility];  Service: Endoscopy;;   PROSTATE BIOPSY  09/05/14   REVERSE SHOULDER ARTHROPLASTY Left 10/15/2021   Procedure: REVERSE SHOULDER ARTHROPLASTY;  Surgeon: Justice Britain, MD;  [facility];  Service: Orthopedics;   Laterality: Left;   ROBOT ASSISTED LAPAROSCOPIC RADICAL PROSTATECTOMY N/A 11/06/2014   Procedure: ROBOTIC ASSISTED LAPAROSCOPIC RADICAL PROSTATECTOMY LEVEL 2;  Surgeon: [name], MD;  [facility];  Service: Urology;  Laterality: N/A;   TOTAL HIP ARTHROPLASTY Left 07/03/2014   Procedure: LEFT TOTAL HIP ARTHROPLASTY ANTERIOR APPROACH;  Surgeon: [name], MD;  [facility];  Service: Orthopedics;  Laterality: Left;   TOTAL HIP ARTHROPLASTY Right 01/05/2018   Procedure: RIGHT TOTAL HIP ARTHROPLASTY ANTERIOR APPROACH;  Surgeon: Paralee Cancel, MD;  [facility]: [facility];  Service: Orthopedics;  Laterality: Right;  70 mins   [facility] Medications:  Home Meds:  No current facility-administered medications on file prior to encounter.   Current Outpatient Medications on File Prior to Encounter  Medication Sig Dispense Refill   amoxicillin (AMOXIL) 500 MG capsule Take 4 capsules by mouth 1 hour prior to dental appointment for pre-medication. 12 capsule 0   atorvastatin (LIPITOR) 20 MG tablet Take 1 tablet by mouth once daily 90 tablet 3   calcium carbonate (OS-CAL - DOSED IN MG OF ELEMENTAL CALCIUM) 1250 (500 Ca) MG tablet Take 1 tablet by mouth daily.     cyclobenzaprine (FLEXERIL) 10 MG tablet 1 q 6-8 hrs prn spasm 30 tablet 0   denosumab (XGEVA) 120 MG/1.7ML SOLN injection Inject 120 mg into the skin every 2 (two) months.  ferrous sulfate 325 (65 FE) MG tablet Take 325 mg by mouth daily with breakfast.     leuprolide, 6 Month, (ELIGARD) 45 MG injection Inject 45 mg into the skin every 6 (six) months.     lutetium Lu 177 Vipivotide Tet (PLUVICTO) 1000 MBQ/ML SOLN Inject into the vein every 6 (six) weeks.     oxyCODONE-acetaminophen (PERCOCET) 5-325 MG tablet 1 q 4-6 hrs prn pain 20 tablet 0   predniSONE (DELTASONE) 10 MG tablet Take 1 tablet (10 mg total) by mouth every morning. 30 tablet 11   prochlorperazine (COMPAZINE) 10 MG tablet Take 1  tablet (10 mg total) by mouth every 6 (six) hours as needed as needed for nausea or vomiting. 30 tablet 8   valsartan (DIOVAN) 160 MG tablet Take 1 tablet (160 mg total) by mouth daily. 90 tablet 3     Scheduled Meds: Continuous Infusions:  sodium chloride     PRN Meds:.  Allergies: No Known Allergies  Family History  Problem Relation Age of Onset   Stroke Mother        multiple. 38.    Heart disease Mother        hx heart valve problem   Prostate cancer Father        66 of prostate cancer   Heart attack Neg Hx    Hypertension Neg Hx     Social History:  reports that he has never smoked. He has never used smokeless tobacco. He reports current alcohol use of about 2.0 - 3.0 standard drinks of alcohol per week. He reports that he does not use drugs.  ROS: A complete review of systems was performed.  All systems are negative except for pertinent findings as noted.  Physical Exam:  Vital signs in last 24 hours: Temp:  [98.9 F (37.2 C)] 98.9 F (37.2 C) (10/23 1102) Pulse Rate:  [74-98] 74 (10/23 1430) Resp:  [17-18] 17 (10/23 1430) BP: (128-147)/(67-78) 130/67 (10/23 1430) SpO2:  [100 %] 100 % (10/23 1430) Weight:  [102.5 kg] 102.5 kg (10/23 1102) Constitutional:  Alert and oriented, No acute distress Cardiovascular: Regular rate and rhythm Respiratory: Normal respiratory effort, Lungs clear bilaterally GI: Abdomen is soft, nontender, nondistended, no abdominal masses GU: No CVA tenderness; 16 fr foley in place draining kool-aid colored urine into leg bag Neurologic: Grossly intact, no focal deficits Psychiatric: Normal mood and affect  Laboratory Data:  Recent Labs    05/05/22 1224  WBC 2.7*  HGB 6.5*  HCT 19.2*  PLT 64*    Recent Labs    05/05/22 1224  NA 134*  K 4.1  CL 103  GLUCOSE 101*  BUN 28*  CALCIUM 7.6*  CREATININE 1.20     Results for orders placed or performed during the hospital encounter of 05/05/22 (from the past 24 hour(s))  CBC  with Differential     Status: Abnormal   Collection Time: 05/05/22 12:24 PM  Result Value Ref Range   WBC 2.7 (L) 4.0 - 10.5 K/uL   RBC 2.12 (L) 4.22 - 5.81 MIL/uL   Hemoglobin 6.5 (LL) 13.0 - 17.0 g/dL   HCT 19.2 (L) 39.0 - 52.0 %   MCV 90.6 80.0 - 100.0 fL   MCH 30.7 26.0 - 34.0 pg   MCHC 33.9 30.0 - 36.0 g/dL   RDW 15.7 (H) 11.5 - 15.5 %   Platelets 64 (L) 150 - 400 K/uL   nRBC 1.5 (H) 0.0 - 0.2 %   Neutrophils Relative % 55 %  Neutro Abs 1.5 (L) 1.7 - 7.7 K/uL   Lymphocytes Relative 16 %   Lymphs Abs 0.4 (L) 0.7 - 4.0 K/uL   Monocytes Relative 20 %   Monocytes Absolute 0.5 0.1 - 1.0 K/uL   Eosinophils Relative 1 %   Eosinophils Absolute 0.0 0.0 - 0.5 K/uL   Basophils Relative 1 %   Basophils Absolute 0.0 0.0 - 0.1 K/uL   Immature Granulocytes 7 %   Abs Immature Granulocytes 0.20 (H) 0.00 - 0.07 K/uL   Tear Drop Cells PRESENT    Ovalocytes PRESENT   Basic metabolic panel     Status: Abnormal   Collection Time: 05/05/22 12:24 PM  Result Value Ref Range   Sodium 134 (L) 135 - 145 mmol/L   Potassium 4.1 3.5 - 5.1 mmol/L   Chloride 103 98 - 111 mmol/L   CO2 22 22 - 32 mmol/L   Glucose, Bld 101 (H) 70 - 99 mg/dL   BUN 28 (H) 8 - 23 mg/dL   Creatinine, Ser 1.20 0.61 - 1.24 mg/dL   Calcium 7.6 (L) 8.9 - 10.3 mg/dL   GFR, Estimated >60 >60 mL/min   Anion gap 9 5 - 15  Lipase, blood     Status: None   Collection Time: 05/05/22 12:24 PM  Result Value Ref Range   Lipase 29 11 - 51 U/L   No results found for this or any previous visit (from the past 240 hour(s)).  Renal Function: Recent Labs    05/05/22 1224  CREATININE 1.20   Estimated Creatinine Clearance: 67.9 mL/min (by C-G formula based on SCr of 1.2 mg/dL).  Radiologic Imaging: No results found.  I independently reviewed the above imaging studies.  Impression/Recommendation 71 yo M with hx of metastatic prostate cancer and radiation cystitis, now with anemia and hematuria  -At this time I do not feel an  emergent trip to the operating room is indicated. I recommend exchanging his catheter for a larger bore hematuria cath and starting CBI -can re-assess urine in morning -transfuse PRN -following  Donald Pore MD 05/05/2022, 3:00 PM  [facility]  Pager: [phone]

## 2022-05-05 NOTE — ED Provider Notes (Signed)
[facility] DEPT Provider Note   CSN: [id number] Arrival date & time: 05/05/22  1051     History Chief Complaint  Patient presents with   Fatigue   Hematuria    HPI Devin Mercado is a 71 y.o. male presenting for continued hematuria x5 days.  [facility] follows with [facility] where he has an indwelling Foley secondary to metastatic prostate cancer and recurrent obstruction.  He has a history of recurrent hemorrhagic cystitis.  Was seen last Wednesday when the symptoms started with a reassuring UA.  Was seen this morning for follow-up at urology clinic who recommended he proceed to the emergency department. He denies any further fevers or abdominal pain.  He is otherwise ambulatory tolerating p.o. intake.  Hemoglobin 8.2 at [facility] clinic 3 days ago.  He had a CT scan 3 days ago with contrast at [facility] that was reassuring but he was told by his oncologist that he was going to need to have a cystoscopy.  Patient's recorded medical, surgical, social, medication list and allergies were reviewed in the Snapshot window as part of the initial history.   Review of Systems   Review of Systems  Constitutional:  Negative for chills and fever.  HENT:  Negative for ear pain and sore throat.   Eyes:  Negative for pain and visual disturbance.  Respiratory:  Negative for cough and shortness of breath.   Cardiovascular:  Negative for chest pain and palpitations.  Gastrointestinal:  Negative for abdominal pain and vomiting.  Genitourinary:  Positive for hematuria. Negative for dysuria.  Musculoskeletal:  Negative for arthralgias and back pain.  Skin:  Negative for color change and rash.  Neurological:  Negative for seizures and syncope.  All other systems reviewed and are negative.   Physical Exam Updated Vital Signs BP 132/64   Pulse 72   Temp 98.9 F (37.2 C) (Oral)   Resp 17   Ht 6' (1.829 m)   Wt 102.5 kg   SpO2 100%   BMI 30.65 kg/m  Physical Exam Vitals  and nursing note reviewed.  Constitutional:      General: He is not in acute distress.    Appearance: He is well-developed.  HENT:     Head: Normocephalic and atraumatic.  Eyes:     Conjunctiva/sclera: Conjunctivae normal.  Cardiovascular:     Rate and Rhythm: Normal rate and regular rhythm.     Heart sounds: No murmur heard. Pulmonary:     Effort: Pulmonary effort is normal. No respiratory distress.     Breath sounds: Normal breath sounds.  Abdominal:     Palpations: Abdomen is soft.     Tenderness: There is no abdominal tenderness.  Musculoskeletal:        General: No swelling.     Cervical back: Neck supple.  Skin:    General: Skin is warm and dry.     Capillary Refill: Capillary refill takes less than 2 seconds.  Neurological:     Mental Status: He is alert.  Psychiatric:        Mood and Affect: Mood normal.      ED Course/ Medical Decision Making/ A&P    Procedures .Critical Care  Performed by: Tretha Sciara, MD Authorized by: Tretha Sciara, MD   Critical care provider statement:    Critical care time (minutes):  30   Critical care was necessary to treat or prevent imminent or life-threatening deterioration of the following conditions:  Circulatory failure (Anemia requiring transfusion and source  control)   Critical care was time spent personally by me on the following activities:  Development of treatment plan with patient or surrogate, discussions with consultants, evaluation of patient's response to treatment, examination of patient, ordering and review of laboratory studies, ordering and review of radiographic studies, ordering and performing treatments and interventions, pulse oximetry, re-evaluation of patient's condition and review of old charts    Medications Ordered in ED Medications  0.9 %  sodium chloride infusion (has no administration in time range)  sodium chloride irrigation 0.9 % 3,000 mL (has no administration in time range)    Medical  Decision Making:    Devin Mercado is a 71 y.o. male who presented to the ED today with hematuria detailed above.     Additional history discussed with patient's family/caregivers.  External chart has been reviewed including [facility] records. Patient's presentation is complicated by their history of multiple comorbid medical problems.  Patient placed on continuous vitals and telemetry monitoring while in ED which was reviewed periodically.   Complete initial physical exam performed, notably the patient  was hemodynamically stable in no acute distress.      Reviewed and confirmed nursing documentation for past medical history, family history, social history.    Initial Assessment:   This is most consistent with an acute life/limb threatening illness complicated by underlying chronic conditions. Given persistent hematuria, infection, new mass, hemorrhagic cystitis are all on the differential. Initial Plan:  Screening labs including CBC and Metabolic panel to evaluate for infectious or metabolic etiology of disease.  Urinalysis with reflex culture ordered to evaluate for UTI or relevant urologic/nephrologic pathology.  Objective evaluation as below reviewed with plan for close reassessment  Initial Study Results:   Laboratory  New critical anemia appreciated on laboratory studies. EKG EKG was reviewed independently. Rate, rhythm, axis, intervals all examined and without medically relevant abnormality. ST segments without concerns for elevations.    Radiology  All images reviewed independently. Agree with radiology report at this time.   No results found.   Consults:  Case discussed with [facility].  They will plan to initiate continuous bladder irrigation tonight and reassess for need for cystoscopy in the a.m. if patient continues to have hematuria after CBI.   Final Assessment and Plan:   Patient consented to blood transfusion after shared medical decision making  and discussion of risks and benefits.  Urology consulted and agrees that likely developing worsening hematuria.  They recommended admission to the hospital for implementation of the above plan.  Patient admitted with no further acute events.    Clinical Impression:  1. Severe anemia      Admit   Final Clinical Impression(s) / ED Diagnoses Final diagnoses:  Severe anemia    Rx / DC Orders ED Discharge Orders     None         Tretha Sciara, MD 05/05/22 [phone]

## 2022-05-05 NOTE — ED Notes (Signed)
4400 ml of clear pink urine emptied from foley bag. Murphy drip continues. Pt tolerating well

## 2022-05-05 NOTE — ED Notes (Signed)
3100 ml clear pink urine and sialine drained from murphy drip. No clots noted.

## 2022-05-06 ENCOUNTER — Other Ambulatory Visit: Payer: Self-pay

## 2022-05-06 DIAGNOSIS — N3041 Irradiation cystitis with hematuria: Secondary | ICD-10-CM | POA: Diagnosis present

## 2022-05-06 DIAGNOSIS — D62 Acute posthemorrhagic anemia: Secondary | ICD-10-CM | POA: Diagnosis present

## 2022-05-06 DIAGNOSIS — M1990 Unspecified osteoarthritis, unspecified site: Secondary | ICD-10-CM | POA: Diagnosis present

## 2022-05-06 DIAGNOSIS — C7951 Secondary malignant neoplasm of bone: Secondary | ICD-10-CM | POA: Diagnosis present

## 2022-05-06 DIAGNOSIS — Z8249 Family history of ischemic heart disease and other diseases of the circulatory system: Secondary | ICD-10-CM | POA: Diagnosis not present

## 2022-05-06 DIAGNOSIS — Z79899 Other long term (current) drug therapy: Secondary | ICD-10-CM | POA: Diagnosis not present

## 2022-05-06 DIAGNOSIS — R310 Gross hematuria: Secondary | ICD-10-CM | POA: Diagnosis present

## 2022-05-06 DIAGNOSIS — E785 Hyperlipidemia, unspecified: Secondary | ICD-10-CM | POA: Diagnosis present

## 2022-05-06 DIAGNOSIS — Z8546 Personal history of malignant neoplasm of prostate: Secondary | ICD-10-CM | POA: Diagnosis not present

## 2022-05-06 DIAGNOSIS — Z96612 Presence of left artificial shoulder joint: Secondary | ICD-10-CM | POA: Diagnosis present

## 2022-05-06 DIAGNOSIS — D61810 Antineoplastic chemotherapy induced pancytopenia: Secondary | ICD-10-CM | POA: Diagnosis present

## 2022-05-06 DIAGNOSIS — Z96643 Presence of artificial hip joint, bilateral: Secondary | ICD-10-CM | POA: Diagnosis present

## 2022-05-06 DIAGNOSIS — D649 Anemia, unspecified: Secondary | ICD-10-CM | POA: Diagnosis not present

## 2022-05-06 DIAGNOSIS — I472 Ventricular tachycardia, unspecified: Secondary | ICD-10-CM | POA: Diagnosis present

## 2022-05-06 DIAGNOSIS — Z950 Presence of cardiac pacemaker: Secondary | ICD-10-CM | POA: Diagnosis not present

## 2022-05-06 DIAGNOSIS — I10 Essential (primary) hypertension: Secondary | ICD-10-CM | POA: Diagnosis present

## 2022-05-06 DIAGNOSIS — Y842 Radiological procedure and radiotherapy as the cause of abnormal reaction of the patient, or of later complication, without mention of misadventure at the time of the procedure: Secondary | ICD-10-CM | POA: Diagnosis present

## 2022-05-06 DIAGNOSIS — Z923 Personal history of irradiation: Secondary | ICD-10-CM | POA: Diagnosis not present

## 2022-05-06 DIAGNOSIS — Z823 Family history of stroke: Secondary | ICD-10-CM | POA: Diagnosis not present

## 2022-05-06 DIAGNOSIS — R531 Weakness: Secondary | ICD-10-CM | POA: Diagnosis present

## 2022-05-06 DIAGNOSIS — I442 Atrioventricular block, complete: Secondary | ICD-10-CM | POA: Diagnosis present

## 2022-05-06 DIAGNOSIS — N421 Congestion and hemorrhage of prostate: Secondary | ICD-10-CM | POA: Diagnosis present

## 2022-05-06 DIAGNOSIS — R338 Other retention of urine: Secondary | ICD-10-CM | POA: Diagnosis present

## 2022-05-06 DIAGNOSIS — Z960 Presence of urogenital implants: Secondary | ICD-10-CM | POA: Diagnosis present

## 2022-05-06 DIAGNOSIS — Z8042 Family history of malignant neoplasm of prostate: Secondary | ICD-10-CM | POA: Diagnosis not present

## 2022-05-06 DIAGNOSIS — T451X5A Adverse effect of antineoplastic and immunosuppressive drugs, initial encounter: Secondary | ICD-10-CM | POA: Diagnosis present

## 2022-05-06 LAB — BASIC METABOLIC PANEL
Anion gap: 6 (ref 5–15)
BUN: 24 mg/dL — ABNORMAL HIGH (ref 8–23)
CO2: 22 mmol/L (ref 22–32)
Calcium: 7.2 mg/dL — ABNORMAL LOW (ref 8.9–10.3)
Chloride: 106 mmol/L (ref 98–111)
Creatinine, Ser: 1.08 mg/dL (ref 0.61–1.24)
GFR, Estimated: >60 mL/min (ref >60)
Glucose, Bld: 114 mg/dL — ABNORMAL HIGH (ref 70–99)
Potassium: 4.4 mmol/L (ref 3.5–5.1)
Sodium: 134 mmol/L — ABNORMAL LOW (ref 135–145)

## 2022-05-06 LAB — CBC
HCT: 21.1 % — ABNORMAL LOW (ref 39.0–52.0)
Hemoglobin: 7.2 g/dL — ABNORMAL LOW (ref 13.0–17.0)
MCH: 30.3 pg (ref 26.0–34.0)
MCHC: 34.1 g/dL (ref 30.0–36.0)
MCV: 88.7 fL (ref 80.0–100.0)
Platelets: 61 10*3/uL — ABNORMAL LOW (ref 150–400)
RBC: 2.38 MIL/uL — ABNORMAL LOW (ref 4.22–5.81)
RDW: 16.8 % — ABNORMAL HIGH (ref 11.5–15.5)
WBC: 2.4 10*3/uL — ABNORMAL LOW (ref 4.0–10.5)
nRBC: 0.8 % — ABNORMAL HIGH (ref 0.0–0.2)

## 2022-05-06 LAB — PREPARE RBC (CROSSMATCH)

## 2022-05-06 MED ORDER — ACETAMINOPHEN 325 MG PO TABS: 650.0000 mg | ORAL_TABLET | Freq: Four times a day (QID) | ORAL | Status: DC | PRN

## 2022-05-06 MED ORDER — FERROUS SULFATE 325 (65 FE) MG PO TABS
325.0000 mg | ORAL_TABLET | Freq: Every day | ORAL | Status: DC
  Administered 2022-05-06 – 2022-05-09 (×4): 325 mg via ORAL
  Filled 2022-05-06 (×4): qty 1

## 2022-05-06 MED ORDER — ONDANSETRON HCL 4 MG/2ML IJ SOLN: 4.0000 mg | Freq: Four times a day (QID) | INTRAMUSCULAR | Status: DC | PRN

## 2022-05-06 MED ORDER — CHLORHEXIDINE GLUCONATE CLOTH 2 % EX PADS
6.0000 | MEDICATED_PAD | Freq: Every day | CUTANEOUS | Status: DC
  Administered 2022-05-06 – 2022-05-08 (×3): 6 via TOPICAL

## 2022-05-06 MED ORDER — ONDANSETRON HCL 4 MG PO TABS: 4.0000 mg | ORAL_TABLET | Freq: Four times a day (QID) | ORAL | Status: DC | PRN

## 2022-05-06 MED ORDER — CALCIUM CARBONATE 1250 (500 CA) MG PO TABS
1.0000 | ORAL_TABLET | ORAL | Status: DC
  Administered 2022-05-08: 1250 mg via ORAL
  Filled 2022-05-06: qty 1

## 2022-05-06 MED ORDER — SODIUM CHLORIDE 0.9% IV SOLUTION: Freq: Once | INTRAVENOUS | Status: AC

## 2022-05-06 MED ORDER — BISACODYL 5 MG PO TBEC
5.0000 mg | DELAYED_RELEASE_TABLET | Freq: Every day | ORAL | Status: DC | PRN
  Administered 2022-05-06: 5 mg via ORAL
  Filled 2022-05-06: qty 1

## 2022-05-06 MED ORDER — IRBESARTAN 150 MG PO TABS
150.0000 mg | ORAL_TABLET | Freq: Every day | ORAL | Status: DC
  Administered 2022-05-06 – 2022-05-09 (×4): 150 mg via ORAL
  Filled 2022-05-06 (×4): qty 1

## 2022-05-06 MED ORDER — ATORVASTATIN CALCIUM 10 MG PO TABS
20.0000 mg | ORAL_TABLET | Freq: Every day | ORAL | Status: DC
  Administered 2022-05-06 – 2022-05-09 (×4): 20 mg via ORAL
  Filled 2022-05-06 (×4): qty 2

## 2022-05-06 MED ORDER — IBUPROFEN 200 MG PO TABS: 400.0000 mg | ORAL_TABLET | Freq: Four times a day (QID) | ORAL | Status: DC | PRN

## 2022-05-06 NOTE — ED Notes (Signed)
Blood consent signed

## 2022-05-06 NOTE — Progress Notes (Signed)
PROGRESS NOTE    Devin Mercado  PTW:[id number] DOB: 1951-07-04 DOA: 05/05/2022 PCP: [name], MD    Brief Narrative:  71 year old with history of metastatic prostate cancer with metastasis to bones, history of radiation cystitis and recurrent urinary retention admitted with hematuria with indwelling Foley catheter.  Followed by urology.  Presentation hemoglobin 6.5.   Assessment & Plan:   Acute blood loss anemia secondary to gross hematuria Prostate cancer with urinary retention with indwelling Foley catheter and gross hematuria.  Presentation hemoglobin 6.5-1 unit of PRBC transfusion overnight-hemoglobin 7.2-1 more unit of PRBC today due to ongoing blood loss and need for procedures.  We will continue to monitor and transfuse to keep hemoglobin more than 8 due to ongoing bleeding. Patient currently with CBI, urology following.  Anticipating cystoscopy.  Hypertension: Blood pressure is stable on home medications.  Hyperlipidemia: Understanding.  AV block: On pacemaker.   DVT prophylaxis: SCDs Start: 05/06/22 7001   Code Status: Full code Family Communication: None at the bedside Disposition Plan: Status is: Inpatient Remains inpatient appropriate because: Continuous bladder irrigation and monitoring, blood transfusions     Consultants:  Urology, following  Procedures:  None  Antimicrobials:  None   Subjective: Patient seen in the morning rounds.  He still in the ER.  CBI running with light pink urine.  Denies any complaints but stayed in the ER all night.  Agreeable for another unit PRBC transfusion and this was initiated.  Objective: Vitals:   05/06/22 1100 05/06/22 1130 05/06/22 1200 05/06/22 1318  BP: (!) 123/95 123/73 108/64 129/75  Pulse: 73 75 74 72  Resp: '20  20 18  '$ Temp:   98.5 F (36.9 C) 99.5 F (37.5 C)  TempSrc:    Oral  SpO2: 98% 97% 96% 100%  Weight:      Height:        Intake/Output Summary (Last 24 hours) at 05/06/2022  1325 Last data filed at 05/06/2022 1300 Gross per 24 hour  Intake 858 ml  Output 4850 ml  Net -3992 ml   Filed Weights   05/05/22 1102  Weight: 102.5 kg    Examination:  General exam: Appears calm and comfortable  Respiratory system: Clear to auscultation. Respiratory effort normal. Cardiovascular system: S1 & S2 heard, RRR.  Left precordial pacemaker in place. Gastrointestinal system: Abdomen is nondistended, soft and nontender. No organomegaly or masses felt. Normal bowel sounds heard. Central nervous system: Alert and oriented. No focal neurological deficits. Foley catheter on CBI, light pink urine.   Data Reviewed: I have personally reviewed following labs and imaging studies  CBC: Recent Labs  Lab 05/05/22 1224 05/06/22 0600  WBC 2.7* 2.4*  NEUTROABS 1.5*  --   HGB 6.5* 7.2*  HCT 19.2* 21.1*  MCV 90.6 88.7  PLT 64* 61*   Basic Metabolic Panel: Recent Labs  Lab 05/05/22 1224 05/06/22 0600  NA 134* 134*  K 4.1 4.4  CL 103 106  CO2 22 22  GLUCOSE 101* 114*  BUN 28* 24*  CREATININE 1.20 1.08  CALCIUM 7.6* 7.2*   GFR: Estimated Creatinine Clearance: 75.5 mL/min (by C-G formula based on SCr of 1.08 mg/dL). Liver Function Tests: No results for input(s): "AST", "ALT", "ALKPHOS", "BILITOT", "PROT", "ALBUMIN" in the last 168 hours. Recent Labs  Lab 05/05/22 1224  LIPASE 29   No results for input(s): "AMMONIA" in the last 168 hours. Coagulation Profile: No results for input(s): "INR", "PROTIME" in the last 168 hours. Cardiac Enzymes: No results for input(s): "  CKTOTAL", "CKMB", "CKMBINDEX", "TROPONINI" in the last 168 hours. BNP (last 3 results) No results for input(s): "PROBNP" in the last 8760 hours. HbA1C: No results for input(s): "HGBA1C" in the last 72 hours. CBG: No results for input(s): "GLUCAP" in the last 168 hours. Lipid Profile: No results for input(s): "CHOL", "HDL", "LDLCALC", "TRIG", "CHOLHDL", "LDLDIRECT" in the last 72 hours. Thyroid  Function Tests: No results for input(s): "TSH", "T4TOTAL", "FREET4", "T3FREE", "THYROIDAB" in the last 72 hours. Anemia Panel: No results for input(s): "VITAMINB12", "FOLATE", "FERRITIN", "TIBC", "IRON", "RETICCTPCT" in the last 72 hours. Sepsis Labs: No results for input(s): "PROCALCITON", "LATICACIDVEN" in the last 168 hours.  No results found for this or any previous visit (from the past 240 hour(s)).       Radiology Studies: No results found.      Scheduled Meds:  atorvastatin  20 mg Oral Daily   [START ON 05/08/2022] calcium carbonate  1 tablet Oral Once per day on Mon Thu   ferrous sulfate  325 mg Oral Q breakfast   irbesartan  150 mg Oral Daily   Continuous Infusions:  sodium chloride irrigation       LOS: 0 days    Time spent: 35 minutes    Barb Merino, MD [facility] Pager [phone]

## 2022-05-06 NOTE — ED Notes (Signed)
New CBI bag of 3000ML hung.

## 2022-05-06 NOTE — ED Notes (Signed)
Emptied 1,400 clear pink urine

## 2022-05-06 NOTE — ED Notes (Signed)
1400 clear drainage emptied from foley bag

## 2022-05-07 DIAGNOSIS — D649 Anemia, unspecified: Secondary | ICD-10-CM | POA: Diagnosis not present

## 2022-05-07 LAB — CBC WITH DIFFERENTIAL/PLATELET
Abs Immature Granulocytes: 0.16 10*3/uL — ABNORMAL HIGH (ref 0.00–0.07)
Basophils Absolute: 0.1 10*3/uL (ref 0.0–0.1)
Basophils Relative: 2 %
Eosinophils Absolute: 0 10*3/uL (ref 0.0–0.5)
Eosinophils Relative: 2 %
HCT: 25.9 % — ABNORMAL LOW (ref 39.0–52.0)
Hemoglobin: 8.9 g/dL — ABNORMAL LOW (ref 13.0–17.0)
Immature Granulocytes: 6 %
Lymphocytes Relative: 19 %
Lymphs Abs: 0.5 10*3/uL — ABNORMAL LOW (ref 0.7–4.0)
MCH: 30.4 pg (ref 26.0–34.0)
MCHC: 34.4 g/dL (ref 30.0–36.0)
MCV: 88.4 fL (ref 80.0–100.0)
Monocytes Absolute: 0.4 10*3/uL (ref 0.1–1.0)
Monocytes Relative: 16 %
Neutro Abs: 1.5 10*3/uL — ABNORMAL LOW (ref 1.7–7.7)
Neutrophils Relative %: 55 %
Platelets: 65 10*3/uL — ABNORMAL LOW (ref 150–400)
RBC: 2.93 MIL/uL — ABNORMAL LOW (ref 4.22–5.81)
RDW: 16 % — ABNORMAL HIGH (ref 11.5–15.5)
WBC: 2.7 10*3/uL — ABNORMAL LOW (ref 4.0–10.5)
nRBC: 0.7 % — ABNORMAL HIGH (ref 0.0–0.2)

## 2022-05-07 LAB — TYPE AND SCREEN
ABO/RH(D): A POS
Antibody Screen: NEGATIVE
Unit division: 0
Unit division: 0

## 2022-05-07 LAB — BPAM RBC
Blood Product Expiration Date: 202311032359
Blood Product Expiration Date: 202311172359
ISSUE DATE / TIME: 202310231735
ISSUE DATE / TIME: 202310240808
Unit Type and Rh: 6200
Unit Type and Rh: 6200

## 2022-05-07 NOTE — Progress Notes (Signed)
Mobility Specialist - Progress Note   05/07/22 1224  Mobility  Activity Ambulated with assistance in hallway  Level of Assistance Standby assist, set-up cues, supervision of patient - no hands on  Assistive Device Front wheel walker  Distance Ambulated (ft) 535 ft  Activity Response Tolerated well  Mobility Referral Yes  $Mobility charge 1 Mobility   Pt received in chair and agreed to mobility, no c/o pain nor discomfort during ambulation. Pt back to chair with all needs met.   Roderick Pee Mobility Specialist

## 2022-05-07 NOTE — Progress Notes (Signed)
PROGRESS NOTE    Devin Mercado  SVX:[id number] DOB: 1951/07/03 DOA: 05/05/2022 PCP: [name], MD    Brief Narrative:  71 year old with history of metastatic prostate cancer with metastasis to bones, history of radiation cystitis and recurrent urinary retention admitted with hematuria with indwelling Foley catheter.  Followed by urology.  Presentation hemoglobin was 6.5.   Assessment & Plan:   Acute blood loss anemia secondary to gross hematuria Prostate cancer with urinary retention with indwelling Foley catheter and gross hematuria.  6.5-2 units of PRBC -hemoglobin 8.9.  We will continue to monitor.   We will continue to monitor and transfuse to keep hemoglobin more than 8 due to ongoing bleeding. Patient currently with CBI, urology following.  Anticipating cystoscopy.  Hypertension: Blood pressure is stable on home medications.  Hyperlipidemia: Understanding.  AV block: On pacemaker.  Stable.  I called urology consult to follow-up on outpatient.  Patient probably benefit with inpatient procedure with ongoing issues.   DVT prophylaxis: SCDs Start: 05/06/22 2330   Code Status: Full code Family Communication: None at the bedside Disposition Plan: Status is: Inpatient Remains inpatient appropriate because: Continuous bladder irrigation and monitoring, blood transfusions     Consultants:  Urology, following  Procedures:  None  Antimicrobials:  None   Subjective: Patient seen and examined.  Denies any complaints today.  Still has ongoing CBI with light pink urine.  Denies any abdominal pain nausea vomiting or suprapubic discomfort.  Able to mobilize around.  Objective: Vitals:   05/06/22 2129 05/07/22 0130 05/07/22 0428 05/07/22 1354  BP: 133/77 128/70 127/78 103/69  Pulse: 71 72 68 79  Resp: '18 16 16 '$ (!) 21  Temp: 98.4 F (36.9 C) 98 F (36.7 C) 97.6 F (36.4 C) 98.7 F (37.1 C)  TempSrc: Oral Oral Oral Oral  SpO2: 99% 99% 99% 99%  Weight:    97.7 kg   Height:        Intake/Output Summary (Last 24 hours) at 05/07/2022 1444 Last data filed at 05/07/2022 1400 Gross per 24 hour  Intake 9240 ml  Output 16576 ml  Net -7336 ml   Filed Weights   05/05/22 1102 05/07/22 0428  Weight: 102.5 kg 97.7 kg    Examination:  General exam: Appears calm and comfortable, sitting in couch. Respiratory system: Clear to auscultation. Respiratory effort normal. Cardiovascular system: S1 & S2 heard, RRR.  Left precordial pacemaker in place. Gastrointestinal system: Abdomen is nondistended, soft and nontender. No organomegaly or masses felt. Normal bowel sounds heard. Central nervous system: Alert and oriented. No focal neurological deficits. Foley catheter on CBI, light pink urine.   Data Reviewed: I have personally reviewed following labs and imaging studies  CBC: Recent Labs  Lab 05/05/22 1224 05/06/22 0600 05/07/22 0902  WBC 2.7* 2.4* 2.7*  NEUTROABS 1.5*  --  1.5*  HGB 6.5* 7.2* 8.9*  HCT 19.2* 21.1* 25.9*  MCV 90.6 88.7 88.4  PLT 64* 61* 65*   Basic Metabolic Panel: Recent Labs  Lab 05/05/22 1224 05/06/22 0600  NA 134* 134*  K 4.1 4.4  CL 103 106  CO2 22 22  GLUCOSE 101* 114*  BUN 28* 24*  CREATININE 1.20 1.08  CALCIUM 7.6* 7.2*   GFR: Estimated Creatinine Clearance: 73.8 mL/min (by C-G formula based on SCr of 1.08 mg/dL). Liver Function Tests: No results for input(s): "AST", "ALT", "ALKPHOS", "BILITOT", "PROT", "ALBUMIN" in the last 168 hours. Recent Labs  Lab 05/05/22 1224  LIPASE 29   No results for input(s): "  AMMONIA" in the last 168 hours. Coagulation Profile: No results for input(s): "INR", "PROTIME" in the last 168 hours. Cardiac Enzymes: No results for input(s): "CKTOTAL", "CKMB", "CKMBINDEX", "TROPONINI" in the last 168 hours. BNP (last 3 results) No results for input(s): "PROBNP" in the last 8760 hours. HbA1C: No results for input(s): "HGBA1C" in the last 72 hours. CBG: No results for  input(s): "GLUCAP" in the last 168 hours. Lipid Profile: No results for input(s): "CHOL", "HDL", "LDLCALC", "TRIG", "CHOLHDL", "LDLDIRECT" in the last 72 hours. Thyroid Function Tests: No results for input(s): "TSH", "T4TOTAL", "FREET4", "T3FREE", "THYROIDAB" in the last 72 hours. Anemia Panel: No results for input(s): "VITAMINB12", "FOLATE", "FERRITIN", "TIBC", "IRON", "RETICCTPCT" in the last 72 hours. Sepsis Labs: No results for input(s): "PROCALCITON", "LATICACIDVEN" in the last 168 hours.  No results found for this or any previous visit (from the past 240 hour(s)).       Radiology Studies: No results found.      Scheduled Meds:  atorvastatin  20 mg Oral Daily   [START ON 05/08/2022] calcium carbonate  1 tablet Oral Once per day on Mon Thu   Chlorhexidine Gluconate Cloth  6 each Topical Daily   ferrous sulfate  325 mg Oral Q breakfast   irbesartan  150 mg Oral Daily   Continuous Infusions:  sodium chloride irrigation       LOS: 1 day    Time spent: 35 minutes    Barb Merino, MD [facility] Pager [phone]

## 2022-05-07 NOTE — Progress Notes (Signed)
Remote pacemaker transmission.   

## 2022-05-08 DIAGNOSIS — D649 Anemia, unspecified: Secondary | ICD-10-CM | POA: Diagnosis not present

## 2022-05-08 LAB — CBC WITH DIFFERENTIAL/PLATELET
Abs Immature Granulocytes: 0.06 10*3/uL (ref 0.00–0.07)
Basophils Absolute: 0 10*3/uL (ref 0.0–0.1)
Basophils Relative: 2 %
Eosinophils Absolute: 0 10*3/uL (ref 0.0–0.5)
Eosinophils Relative: 2 %
HCT: 25.3 % — ABNORMAL LOW (ref 39.0–52.0)
Hemoglobin: 8.8 g/dL — ABNORMAL LOW (ref 13.0–17.0)
Immature Granulocytes: 3 %
Lymphocytes Relative: 18 %
Lymphs Abs: 0.4 10*3/uL — ABNORMAL LOW (ref 0.7–4.0)
MCH: 30.7 pg (ref 26.0–34.0)
MCHC: 34.8 g/dL (ref 30.0–36.0)
MCV: 88.2 fL (ref 80.0–100.0)
Monocytes Absolute: 0.5 10*3/uL (ref 0.1–1.0)
Monocytes Relative: 19 %
Neutro Abs: 1.4 10*3/uL — ABNORMAL LOW (ref 1.7–7.7)
Neutrophils Relative %: 56 %
Platelets: 70 10*3/uL — ABNORMAL LOW (ref 150–400)
RBC: 2.87 MIL/uL — ABNORMAL LOW (ref 4.22–5.81)
RDW: 16 % — ABNORMAL HIGH (ref 11.5–15.5)
WBC: 2.4 10*3/uL — ABNORMAL LOW (ref 4.0–10.5)
nRBC: 1.3 % — ABNORMAL HIGH (ref 0.0–0.2)

## 2022-05-08 MED ORDER — MELATONIN 3 MG PO TABS
3.0000 mg | ORAL_TABLET | Freq: Every day | ORAL | Status: DC
  Filled 2022-05-08: qty 1

## 2022-05-08 NOTE — Progress Notes (Signed)
  Transition of Care [facility]) Screening Note   Patient Details  Name: Devin Mercado Date of Birth: June 05, 1951   Transition of Care [facility]) CM/SW Contact:    Vassie Moselle, LCSW Phone Number: 05/08/2022, 9:44 AM    Transition of Care Department [facility]) has reviewed patient and no TOC needs have been identified at this time. We will continue to monitor patient advancement through interdisciplinary progression rounds. If new patient transition needs arise, please place a TOC consult.

## 2022-05-08 NOTE — Progress Notes (Signed)
Patient ID: Devin Mercado, male   DOB: [birth year], 71 y.o.   MRN: [id number]    Subjective: Pt has received 2 units of PRBCs since hospital admission.  Hgb up to 8.9 yesterday from 6.5 on admission.  Has not required hand irrigation of clots overnight.  Objective: Vital signs in last 24 hours: Temp:  [98.7 F (37.1 C)-99.5 F (37.5 C)] 98.7 F (37.1 C) (10/26 0427) Pulse Rate:  [66-79] 66 (10/26 0427) Resp:  [18-21] 18 (10/26 0427) BP: (103-123)/(69-77) 123/77 (10/26 0427) SpO2:  [99 %-100 %] 99 % (10/26 0427) Weight:  [101 kg] 101 kg (10/26 0427)  Intake/Output from previous day: 10/25 0701 - 10/26 0700 In: 14000  Out: 17300 [Urine:17300] Intake/Output this shift: No intake/output data recorded.  Physical Exam:  General: Alert and oriented GU: Urine clear on slow CBI drip and remains clear after stopping drip and monitoring for about 5 minutes  Lab Results: Recent Labs    05/05/22 1224 05/06/22 0600 05/07/22 0902  HGB 6.5* 7.2* 8.9*  HCT 19.2* 21.1* 25.9*      Latest Ref Rng & Units 05/07/2022    9:02 AM 05/06/2022    6:00 AM 05/05/2022   12:24 PM  CBC  WBC 4.0 - 10.5 K/uL 2.7  2.4  2.7   Hemoglobin 13.0 - 17.0 g/dL 8.9  7.2  6.5   Hematocrit 39.0 - 52.0 % 25.9  21.1  19.2   Platelets 150 - 400 K/uL 65  61  64      BMET Recent Labs    05/05/22 1224 05/06/22 0600  NA 134* 134*  K 4.1 4.4  CL 103 106  CO2 22 22  GLUCOSE 101* 114*  BUN 28* 24*  CREATININE 1.20 1.08  CALCIUM 7.6* 7.2*     Studies/Results: No results found.  Assessment/Plan: 1) Prostate cancer: Currently receiving ADT and Pluvicto (Lutetium-177).  Pluvicto has been held due to thrombocytopenia.  Plateletes have been low but stable and above 60,000. 2) Hematuria:  Most likely related to radiation cystitis.  Hgb pending for today.  Urine is clear.  Will hold CBI and re-evaluate later today.  Will plan to remove his catheter if still clear.  Will then proceed with outpatient evaluation  including cystoscopy to rule out other causes.  If this is radiation cystitis which is most likely based on prior evaluations, will consider outpatient hyperbaric oxygen.   LOS: 2 days   [name] 05/08/2022, 8:12 AM

## 2022-05-08 NOTE — Progress Notes (Signed)
Mobility Specialist - Progress Note   05/08/22 1240  Mobility  Activity Ambulated independently in hallway  Level of Assistance Modified independent, requires aide device or extra time  Assistive Device Front wheel walker  Distance Ambulated (ft) 600 ft  Activity Response Tolerated well  Mobility Referral Yes  $Mobility charge 1 Mobility   Pt received in chair and agreed to mobility, no c/o pain nor discomfort. Pt back to chair with all needs met.   Roderick Pee Mobility Specialist

## 2022-05-08 NOTE — Progress Notes (Signed)
PROGRESS NOTE    Devin Mercado  QQP:[id number] DOB: [birth year] DOA: 05/05/2022 PCP: [name], MD    Brief Narrative:  71 year old with history of metastatic prostate cancer with metastasis to bones, history of radiation cystitis and recurrent urinary retention admitted with hematuria with indwelling Foley catheter.  Followed by urology.  Presentation hemoglobin was 6.5.   Assessment & Plan:   Acute blood loss anemia secondary to gross hematuria Prostate cancer with urinary retention with indwelling Foley catheter and gross hematuria.  6.5-2 units of PRBC -hemoglobin 8.9-8.5.  We will continue to monitor.   We will continue to monitor and transfuse to keep hemoglobin more than 8 due to ongoing bleeding. Patient currently with CBI, urology following.  Anticipating inpatient procedures due to ongoing bleeding.  Hypertension: Blood pressure is stable on home medications.  Hyperlipidemia: Understanding.  AV block: On pacemaker.  Stable.  Discussed with urology, ongoing mild intermittent hematuria, may benefit with inpatient procedures.  DVT prophylaxis: SCDs Start: 05/06/22 7124   Code Status: Full code Family Communication: None at the bedside Disposition Plan: Status is: Inpatient Remains inpatient appropriate because: Continuous bladder irrigation and monitoring, blood transfusions     Consultants:  Urology, following  Procedures:  None  Antimicrobials:  None   Subjective:  Patient seen and examined.  Denies any complaints.  He had CBI stopped about 1 hour ago, started noticing hemorrhagic urine so it was restarted.  Still has light pink urine with CBI on.  Objective: Vitals:   05/07/22 0428 05/07/22 1354 05/07/22 2004 05/08/22 0427  BP: 127/78 103/69 113/73 123/77  Pulse: 68 79 79 66  Resp: 16 (!) '21 20 18  '$ Temp: 97.6 F (36.4 C) 98.7 F (37.1 C) 99.5 F (37.5 C) 98.7 F (37.1 C)  TempSrc: Oral Oral    SpO2: 99% 99% 100% 99%  Weight: 97.7 kg    101 kg  Height:        Intake/Output Summary (Last 24 hours) at 05/08/2022 1207 Last data filed at 05/08/2022 0920 Gross per 24 hour  Intake 11480 ml  Output 14850 ml  Net -3370 ml    Filed Weights   05/05/22 1102 05/07/22 0428 05/08/22 0427  Weight: 102.5 kg 97.7 kg 101 kg    Examination:  General exam: Appears calm and comfortable, seated in chair. Respiratory system: Clear to auscultation. Respiratory effort normal. Cardiovascular system: S1 & S2 heard, RRR.  Left precordial pacemaker in place. Gastrointestinal system: Abdomen is nondistended, soft and nontender. No organomegaly or masses felt. Normal bowel sounds heard. Central nervous system: Alert and oriented. No focal neurological deficits. Foley catheter on CBI, light pink urine.   Data Reviewed: I have personally reviewed following labs and imaging studies  CBC: Recent Labs  Lab 05/05/22 1224 05/06/22 0600 05/07/22 0902 05/08/22 1035  WBC 2.7* 2.4* 2.7* 2.4*  NEUTROABS 1.5*  --  1.5* 1.4*  HGB 6.5* 7.2* 8.9* 8.8*  HCT 19.2* 21.1* 25.9* 25.3*  MCV 90.6 88.7 88.4 88.2  PLT 64* 61* 65* 70*    Basic Metabolic Panel: Recent Labs  Lab 05/05/22 1224 05/06/22 0600  NA 134* 134*  K 4.1 4.4  CL 103 106  CO2 22 22  GLUCOSE 101* 114*  BUN 28* 24*  CREATININE 1.20 1.08  CALCIUM 7.6* 7.2*    GFR: Estimated Creatinine Clearance: 75 mL/min (by C-G formula based on SCr of 1.08 mg/dL). Liver Function Tests: No results for input(s): "AST", "ALT", "ALKPHOS", "BILITOT", "PROT", "ALBUMIN" in the last 168  hours. Recent Labs  Lab 05/05/22 1224  LIPASE 29    No results for input(s): "AMMONIA" in the last 168 hours. Coagulation Profile: No results for input(s): "INR", "PROTIME" in the last 168 hours. Cardiac Enzymes: No results for input(s): "CKTOTAL", "CKMB", "CKMBINDEX", "TROPONINI" in the last 168 hours. BNP (last 3 results) No results for input(s): "PROBNP" in the last 8760 hours. HbA1C: No results  for input(s): "HGBA1C" in the last 72 hours. CBG: No results for input(s): "GLUCAP" in the last 168 hours. Lipid Profile: No results for input(s): "CHOL", "HDL", "LDLCALC", "TRIG", "CHOLHDL", "LDLDIRECT" in the last 72 hours. Thyroid Function Tests: No results for input(s): "TSH", "T4TOTAL", "FREET4", "T3FREE", "THYROIDAB" in the last 72 hours. Anemia Panel: No results for input(s): "VITAMINB12", "FOLATE", "FERRITIN", "TIBC", "IRON", "RETICCTPCT" in the last 72 hours. Sepsis Labs: No results for input(s): "PROCALCITON", "LATICACIDVEN" in the last 168 hours.  No results found for this or any previous visit (from the past 240 hour(s)).       Radiology Studies: No results found.      Scheduled Meds:  atorvastatin  20 mg Oral Daily   calcium carbonate  1 tablet Oral Once per day on Mon Thu   Chlorhexidine Gluconate Cloth  6 each Topical Daily   ferrous sulfate  325 mg Oral Q breakfast   irbesartan  150 mg Oral Daily   Continuous Infusions:  sodium chloride irrigation       LOS: 2 days    Time spent: 35 minutes    Barb Merino, MD [facility] Pager [phone]

## 2022-05-08 NOTE — Progress Notes (Signed)
Patient ID: Devin Mercado, male   DOB: 1951/03/28, 71 y.o.   MRN: [id number]  Hgb stable at 8.8.  Irrigated some small clots but blood appears to be old without evidence of active bleeding.  Will leave the catheter in overnight with CBI off and plan for probable voiding trial in AM if ok.

## 2022-05-09 ENCOUNTER — Other Ambulatory Visit (HOSPITAL_BASED_OUTPATIENT_CLINIC_OR_DEPARTMENT_OTHER): Payer: Self-pay

## 2022-05-09 DIAGNOSIS — D649 Anemia, unspecified: Secondary | ICD-10-CM | POA: Diagnosis not present

## 2022-05-09 LAB — CBC WITH DIFFERENTIAL/PLATELET
Abs Immature Granulocytes: 0.11 10*3/uL — ABNORMAL HIGH (ref 0.00–0.07)
Basophils Absolute: 0 10*3/uL (ref 0.0–0.1)
Basophils Relative: 2 %
Eosinophils Absolute: 0 10*3/uL (ref 0.0–0.5)
Eosinophils Relative: 2 %
HCT: 24 % — ABNORMAL LOW (ref 39.0–52.0)
Hemoglobin: 8.1 g/dL — ABNORMAL LOW (ref 13.0–17.0)
Immature Granulocytes: 6 %
Lymphocytes Relative: 22 %
Lymphs Abs: 0.4 10*3/uL — ABNORMAL LOW (ref 0.7–4.0)
MCH: 30.2 pg (ref 26.0–34.0)
MCHC: 33.8 g/dL (ref 30.0–36.0)
MCV: 89.6 fL (ref 80.0–100.0)
Monocytes Absolute: 0.3 10*3/uL (ref 0.1–1.0)
Monocytes Relative: 16 %
Neutro Abs: 1 10*3/uL — ABNORMAL LOW (ref 1.7–7.7)
Neutrophils Relative %: 52 %
Platelets: 58 10*3/uL — ABNORMAL LOW (ref 150–400)
RBC: 2.68 MIL/uL — ABNORMAL LOW (ref 4.22–5.81)
RDW: 15.9 % — ABNORMAL HIGH (ref 11.5–15.5)
WBC: 1.9 10*3/uL — ABNORMAL LOW (ref 4.0–10.5)
nRBC: 0 % (ref 0.0–0.2)

## 2022-05-09 NOTE — Care Management Important Message (Signed)
Important Message  Patient Details IM Letter given  Name: NIGUEL MOURE MRN: [id number] Date of Birth: 07-04-1951   Medicare Important Message Given:  Yes     Kerin Salen 05/09/2022, 11:42 AM

## 2022-05-09 NOTE — Progress Notes (Signed)
Mobility Specialist - Progress Note  05/09/22 1025  Mobility  Activity Ambulated with assistance in hallway  Level of Assistance Standby assist, set-up cues, supervision of patient - no hands on  Assistive Device Front wheel walker  Distance Ambulated (ft) 500 ft  Activity Response Tolerated well  Mobility Referral Yes  $Mobility charge 1 Mobility   Pt received in recliner and agreeable to mobility. No complaints during mobility. Pt to recliner after session with all needs met.    [facility]

## 2022-05-09 NOTE — Discharge Summary (Signed)
Physician Discharge Summary  Devin Mercado MBW:[id number] DOB: 1950-07-15 DOA: 05/05/2022  PCP: [name], MD  Admit date: 05/05/2022 Discharge date: 05/09/2022  Admitted From: Home Disposition: Home  Recommendations for Outpatient Follow-up:  Follow up with PCP in 1-2 weeks Please obtain BMP/CBC in one week Urology to schedule follow-up  Discharge Condition: Stable CODE STATUS: Full code Diet recommendation: Low-salt diet, hydration  Discharge summary: 71 year old with history of metastatic prostate cancer with metastasis to bones, history of radiation cystitis and recurrent urinary retention admitted with hematuria with indwelling Foley catheter.  Followed by urology.  Presentation hemoglobin was 6.5.   Acute blood loss anemia secondary to gross hematuria. Prostate cancer with urinary retention with indwelling Foley catheter and gross hematuria.   6.5-2 units of PRBC -hemoglobin 8.9-8.5-8.2.  Remained fairly stable after initial transfusion. Patient was on continuous bladder irrigation for 4 days, clamped today and ultimately Foley catheter was removed. Patient does still have fresh hematuria that is intermittent without any retention. As per urology recommendation, patient is discharged home.  He will expect hematuria but any worsening bleeding, retention he will report.   Hypertension: Blood pressure is stable on home medications.   Hyperlipidemia: Stable.   AV block: On pacemaker.  Stable.   Pancytopenia: Related to his chemotherapy with Pluvicto.  Stable.  Patient seen and examined.  Discussed with urology and recommended stable for discharge.  Discharge Diagnoses:  Principal Problem:   Symptomatic anemia Active Problems:   Hyperlipidemia   Essential hypertension   Atrioventricular block, complete (HCC)   Prostate cancer ([place])   Gross hematuria    Discharge Instructions  Discharge Instructions     Call MD for:   Complete by: As directed     Difficulty urinating , pain   Diet - low sodium heart healthy   Complete by: As directed    Drink plenty of water and hydrate yourself well.   Increase activity slowly   Complete by: As directed       Allergies as of 05/09/2022   No Known Allergies      Medication List     STOP taking these medications    cyclobenzaprine 10 MG tablet Commonly known as: FLEXERIL   oxyCODONE-acetaminophen 5-325 MG tablet Commonly known as: Percocet   predniSONE 10 MG tablet Commonly known as: DELTASONE   prochlorperazine 10 MG tablet Commonly known as: COMPAZINE       TAKE these medications    acetaminophen 325 MG tablet Commonly known as: TYLENOL Take 650 mg by mouth every 6 (six) hours as needed for mild pain or headache.   amoxicillin 500 MG capsule Commonly known as: AMOXIL Take 4 capsules by mouth 1 hour prior to dental appointment for pre-medication. What changed:  how much to take how to take this when to take this additional instructions   atorvastatin 20 MG tablet Commonly known as: LIPITOR Take 1 tablet by mouth once daily   calcium carbonate 1250 (500 Ca) MG tablet Commonly known as: OS-CAL - dosed in mg of elemental calcium Take 1 tablet by mouth 2 (two) times a week.   ferrous sulfate 325 (65 FE) MG tablet Take 325 mg by mouth daily with breakfast.   ibuprofen 200 MG tablet Commonly known as: ADVIL Take 400 mg by mouth every 6 (six) hours as needed for mild pain or headache.   leuprolide (6 Month) 45 MG injection Commonly known as: ELIGARD Inject 45 mg into the skin every 6 (six) months.   valsartan  160 MG tablet Commonly known as: DIOVAN Take 1 tablet (160 mg total) by mouth daily.   Xgeva 120 MG/1.7ML Soln injection Generic drug: denosumab Inject 120 mg into the skin every 2 (two) months.        No Known Allergies  Consultations: Urology   Procedures/Studies: CUP PACEART REMOTE DEVICE CHECK  Result Date: 04/23/2022 Scheduled remote  reviewed. Normal device function.  8 VHR's, one showing 6 beats of likely NSVT, other regular 1:1, longest duration 20mn 20 sec Next remote 91 days. LA  (Echo, Carotid, EGD, Colonoscopy, ERCP)    Subjective: Patient seen multiple times today.  Seen in the afternoon for discharge readiness.  He feels fine.  He is drinking plenty of coffee and water to keep urine flowing.  He had 3 urines after Foley removal, each was bloody and there was some blood clots.  Updated to care team and urology.   Discharge Exam: Vitals:   05/09/22 0420 05/09/22 1354  BP: 127/71 114/69  Pulse: 70 83  Resp: 18 17  Temp: 98.2 F (36.8 C) 99.5 F (37.5 C)  SpO2: 99% 100%   Vitals:   05/08/22 2102 05/09/22 0420 05/09/22 0651 05/09/22 1354  BP: 131/71 127/71  114/69  Pulse: 93 70  83  Resp: '20 18  17  '$ Temp: 98 F (36.7 C) 98.2 F (36.8 C)  99.5 F (37.5 C)  TempSrc: Oral Oral  Oral  SpO2: 100% 99%  100%  Weight:   100.3 kg   Height:        General: Pt is alert, awake, not in acute distress Walking around in the hallway. Cardiovascular: RRR, S1/S2 +, no rubs, no gallops Respiratory: CTA bilaterally, no wheezing, no rhonchi Abdominal: Soft, NT, ND, bowel sounds + Extremities: no edema, no cyanosis    The results of significant diagnostics from this hospitalization (including imaging, microbiology, ancillary and laboratory) are listed below for reference.     Microbiology: No results found for this or any previous visit (from the past 240 hour(s)).   Labs: BNP (last 3 results) No results for input(s): "BNP" in the last 8760 hours. Basic Metabolic Panel: Recent Labs  Lab 05/05/22 1224 05/06/22 0600  NA 134* 134*  K 4.1 4.4  CL 103 106  CO2 22 22  GLUCOSE 101* 114*  BUN 28* 24*  CREATININE 1.20 1.08  CALCIUM 7.6* 7.2*   Liver Function Tests: No results for input(s): "AST", "ALT", "ALKPHOS", "BILITOT", "PROT", "ALBUMIN" in the last 168 hours. Recent Labs  Lab 05/05/22 1224  LIPASE  29   No results for input(s): "AMMONIA" in the last 168 hours. CBC: Recent Labs  Lab 05/05/22 1224 05/06/22 0600 05/07/22 0902 05/08/22 1035 05/09/22 0527  WBC 2.7* 2.4* 2.7* 2.4* 1.9*  NEUTROABS 1.5*  --  1.5* 1.4* 1.0*  HGB 6.5* 7.2* 8.9* 8.8* 8.1*  HCT 19.2* 21.1* 25.9* 25.3* 24.0*  MCV 90.6 88.7 88.4 88.2 89.6  PLT 64* 61* 65* 70* 58*   Cardiac Enzymes: No results for input(s): "CKTOTAL", "CKMB", "CKMBINDEX", "TROPONINI" in the last 168 hours. BNP: Invalid input(s): "POCBNP" CBG: No results for input(s): "GLUCAP" in the last 168 hours. D-Dimer No results for input(s): "DDIMER" in the last 72 hours. Hgb A1c No results for input(s): "HGBA1C" in the last 72 hours. Lipid Profile No results for input(s): "CHOL", "HDL", "LDLCALC", "TRIG", "CHOLHDL", "LDLDIRECT" in the last 72 hours. Thyroid function studies No results for input(s): "TSH", "T4TOTAL", "T3FREE", "THYROIDAB" in the last 72 hours.  Invalid input(s): "  FREET3" Anemia work up No results for input(s): "VITAMINB12", "FOLATE", "FERRITIN", "TIBC", "IRON", "RETICCTPCT" in the last 72 hours. Urinalysis    Component Value Date/Time   COLORURINE RED (A) 05/05/2022 1505   APPEARANCEUR HAZY (A) 05/05/2022 1505   LABSPEC  05/05/2022 1505    TEST NOT REPORTED DUE TO COLOR INTERFERENCE OF URINE PIGMENT   PHURINE  05/05/2022 1505    TEST NOT REPORTED DUE TO COLOR INTERFERENCE OF URINE PIGMENT   GLUCOSEU (A) 05/05/2022 1505    TEST NOT REPORTED DUE TO COLOR INTERFERENCE OF URINE PIGMENT   GLUCOSEU NEGATIVE 03/06/2009 0819   HGBUR (A) 05/05/2022 1505    TEST NOT REPORTED DUE TO COLOR INTERFERENCE OF URINE PIGMENT   HGBUR trace-lysed 10/05/2007 0823   BILIRUBINUR (A) 05/05/2022 1505    TEST NOT REPORTED DUE TO COLOR INTERFERENCE OF URINE PIGMENT   BILIRUBINUR neg 03/15/2020 0831   KETONESUR (A) 05/05/2022 1505    TEST NOT REPORTED DUE TO COLOR INTERFERENCE OF URINE PIGMENT   PROTEINUR (A) 05/05/2022 1505    TEST NOT  REPORTED DUE TO COLOR INTERFERENCE OF URINE PIGMENT   UROBILINOGEN 0.2 03/15/2020 0831   UROBILINOGEN 0.2 06/26/2014 1115   NITRITE (A) 05/05/2022 1505    TEST NOT REPORTED DUE TO COLOR INTERFERENCE OF URINE PIGMENT   LEUKOCYTESUR (A) 05/05/2022 1505    TEST NOT REPORTED DUE TO COLOR INTERFERENCE OF URINE PIGMENT   Sepsis Labs Recent Labs  Lab 05/06/22 0600 05/07/22 0902 05/08/22 1035 05/09/22 0527  WBC 2.4* 2.7* 2.4* 1.9*   Microbiology No results found for this or any previous visit (from the past 240 hour(s)).   Time coordinating discharge: 35 minutes  SIGNED:   Barb Merino, MD  [facility] 05/09/2022, 2:26 PM

## 2022-05-09 NOTE — Progress Notes (Signed)
Patient ID: Devin Mercado, male   DOB: [birth year], 71 y.o.   MRN: [id number]    Subjective: No issues with catheter overnight.  CBI has remained off.  Objective: Vital signs in last 24 hours: Temp:  [98 F (36.7 C)-98.2 F (36.8 C)] 98.2 F (36.8 C) (10/27 0420) Pulse Rate:  [70-93] 70 (10/27 0420) Resp:  [16-20] 18 (10/27 0420) BP: (126-131)/(71-78) 127/71 (10/27 0420) SpO2:  [99 %-100 %] 99 % (10/27 0420) Weight:  [100.3 kg] 100.3 kg (10/27 0651)  Intake/Output from previous day: 10/26 0701 - 10/27 0700 In: 480 [P.O.:480] Out: 2225 [Urine:2225] Intake/Output this shift: No intake/output data recorded.  Physical Exam:  General: Alert and oriented GU: Urine with tea colored drainage and draining well  Lab Results: Recent Labs    05/07/22 0902 05/08/22 1035 05/09/22 0527  HGB 8.9* 8.8* 8.1*  HCT 25.9* 25.3* 24.0*    Procedure:  Cathter was removed at 7 AM   Assessment/Plan: 1) Hematuria: Likely related to radiation cystitis.  Hgb relatively stable and there does not appear to be active GU bleeding.  Also with thrombocytopenia and leukopenia likely related to Pluvicto.   Voiding trial this morning and if voiding ok (even if some hematuria), ok for discharge from urologic perspective.  Will arrange outpatient follow up to rule out other causes with cystoscopy once urine has cleared and will then consider outpatient hyperbaric oxygen if radiation cystitis is confirmed.   LOS: 3 days   [name] 05/09/2022, 7:22 AM

## 2022-05-09 NOTE — Progress Notes (Signed)
Mobility Specialist - Progress Note   05/09/22 1409  Mobility  Activity Ambulated with assistance in hallway  Level of Assistance Modified independent, requires aide device or extra time  Assistive Device Front wheel walker  Distance Ambulated (ft) 500 ft  Activity Response Tolerated well  Mobility Referral Yes  $Mobility charge 1 Mobility   Pt received in recliner and agreeable to mobility. No complaints during mobility. Pt to recliner after session with all needs met.    [facility]

## 2022-05-09 NOTE — Progress Notes (Signed)
PROGRESS NOTE    Devin Mercado  UXL:[id number] DOB: 01-08-1951 DOA: 05/05/2022 PCP: [name], MD    Brief Narrative:  71 year old with history of metastatic prostate cancer with metastasis to bones, history of radiation cystitis and recurrent urinary retention admitted with hematuria with indwelling Foley catheter.  Followed by urology.  Presentation hemoglobin was 6.5.   Assessment & Plan:   Acute blood loss anemia secondary to gross hematuria Prostate cancer with urinary retention with indwelling Foley catheter and gross hematuria.  6.5-2 units of PRBC -hemoglobin 8.9-8.5-8.2.  We will continue to monitor.   We will continue to monitor and transfuse to keep hemoglobin more than 8 due to ongoing bleeding. 10/26, CBI was stopped with clearing urine.  Foley catheter was removed.  Patient had been urinating with fresh blood and small clots since removal of Foley catheter.  Plan per urology.  Hypertension: Blood pressure is stable on home medications.  Hyperlipidemia: Understanding.  AV block: On pacemaker.  Stable.  Pancytopenia: Related to his chemotherapy with Pluvicto.  Discussed with urology, ongoing mild intermittent hematuria, may benefit with inpatient procedures.  DVT prophylaxis: SCDs Start: 05/06/22 5366   Code Status: Full code Family Communication: None at the bedside Disposition Plan: Status is: Inpatient Remains inpatient appropriate because: Ongoing hematuria.   Consultants:  Urology, following  Procedures:  None  Antimicrobials:  None   Subjective:  Patient seen and examined in the morning rounds when he just had his catheter removed.  Patient was very happy and delighted. I went back to check on him, he had urine in the urinal with fresh blood and small amount of clots every time he was urinating.  Patient himself denies any pain, swelling or discomfort. Notified his urologist.  Objective: Vitals:   05/08/22 2102 05/09/22 0420  05/09/22 0651 05/09/22 1354  BP: 131/71 127/71  114/69  Pulse: 93 70  83  Resp: '20 18  17  '$ Temp: 98 F (36.7 C) 98.2 F (36.8 C)  99.5 F (37.5 C)  TempSrc: Oral Oral  Oral  SpO2: 100% 99%  100%  Weight:   100.3 kg   Height:        Intake/Output Summary (Last 24 hours) at 05/09/2022 1413 Last data filed at 05/09/2022 1215 Gross per 24 hour  Intake 456 ml  Output 1990 ml  Net -1534 ml    Filed Weights   05/07/22 0428 05/08/22 0427 05/09/22 0651  Weight: 97.7 kg 101 kg 100.3 kg    Examination:  General exam: Appears calm and comfortable, seated in chair. Respiratory system: Clear to auscultation. Respiratory effort normal. Cardiovascular system: S1 & S2 heard, RRR.  Left precordial pacemaker in place. Gastrointestinal system: Abdomen is nondistended, soft and nontender. No organomegaly or masses felt. Normal bowel sounds heard. Central nervous system: Alert and oriented. No focal neurological deficits. Has urine in the bathroom in the urinal, urine was dumped however there is fresh blood in the urinal.   Data Reviewed: I have personally reviewed following labs and imaging studies  CBC: Recent Labs  Lab 05/05/22 1224 05/06/22 0600 05/07/22 0902 05/08/22 1035 05/09/22 0527  WBC 2.7* 2.4* 2.7* 2.4* 1.9*  NEUTROABS 1.5*  --  1.5* 1.4* 1.0*  HGB 6.5* 7.2* 8.9* 8.8* 8.1*  HCT 19.2* 21.1* 25.9* 25.3* 24.0*  MCV 90.6 88.7 88.4 88.2 89.6  PLT 64* 61* 65* 70* 58*    Basic Metabolic Panel: Recent Labs  Lab 05/05/22 1224 05/06/22 0600  NA 134* 134*  K 4.1  4.4  CL 103 106  CO2 22 22  GLUCOSE 101* 114*  BUN 28* 24*  CREATININE 1.20 1.08  CALCIUM 7.6* 7.2*    GFR: Estimated Creatinine Clearance: 74.7 mL/min (by C-G formula based on SCr of 1.08 mg/dL). Liver Function Tests: No results for input(s): "AST", "ALT", "ALKPHOS", "BILITOT", "PROT", "ALBUMIN" in the last 168 hours. Recent Labs  Lab 05/05/22 1224  LIPASE 29    No results for input(s): "AMMONIA" in  the last 168 hours. Coagulation Profile: No results for input(s): "INR", "PROTIME" in the last 168 hours. Cardiac Enzymes: No results for input(s): "CKTOTAL", "CKMB", "CKMBINDEX", "TROPONINI" in the last 168 hours. BNP (last 3 results) No results for input(s): "PROBNP" in the last 8760 hours. HbA1C: No results for input(s): "HGBA1C" in the last 72 hours. CBG: No results for input(s): "GLUCAP" in the last 168 hours. Lipid Profile: No results for input(s): "CHOL", "HDL", "LDLCALC", "TRIG", "CHOLHDL", "LDLDIRECT" in the last 72 hours. Thyroid Function Tests: No results for input(s): "TSH", "T4TOTAL", "FREET4", "T3FREE", "THYROIDAB" in the last 72 hours. Anemia Panel: No results for input(s): "VITAMINB12", "FOLATE", "FERRITIN", "TIBC", "IRON", "RETICCTPCT" in the last 72 hours. Sepsis Labs: No results for input(s): "PROCALCITON", "LATICACIDVEN" in the last 168 hours.  No results found for this or any previous visit (from the past 240 hour(s)).       Radiology Studies: No results found.      Scheduled Meds:  atorvastatin  20 mg Oral Daily   calcium carbonate  1 tablet Oral Once per day on Mon Thu   Chlorhexidine Gluconate Cloth  6 each Topical Daily   ferrous sulfate  325 mg Oral Q breakfast   irbesartan  150 mg Oral Daily   melatonin  3 mg Oral QHS   Continuous Infusions:  sodium chloride irrigation       LOS: 3 days    Time spent: 35 minutes    Barb Merino, MD [facility] Pager [phone]

## 2022-05-12 ENCOUNTER — Telehealth: Payer: Self-pay

## 2022-05-12 NOTE — Telephone Encounter (Signed)
Transition Care Management Unsuccessful Follow-up Telephone Call  Date of discharge and from where:  [facility] 05/09/2022  Attempts:  1st Attempt  Reason for unsuccessful TCM follow-up call:  Unable to leave message, mailbox full Juanda Crumble, [place] Direct Dial [phone]

## 2022-05-13 NOTE — Telephone Encounter (Signed)
Noted  

## 2022-05-13 NOTE — Telephone Encounter (Signed)
Transition Care Management Follow-up Telephone Call Date of discharge and from where: [facility] How have you been since you were released from the hospital? better Any questions or concerns? No  Items Reviewed: Did the pt receive and understand the discharge instructions provided? Yes  Medications obtained and verified? Yes  Other? No  Any new allergies since your discharge? No  Dietary orders reviewed? Yes Do you have support at home? Yes   Home Care and Equipment/Supplies: Were home health services ordered? no If so, what is the name of the agency? N/a  Has the agency set up a time to come to the patient's home? not applicable Were any new equipment or medical supplies ordered?  No What is the name of the medical supply agency? N/a Were you able to get the supplies/equipment? no Do you have any questions related to the use of the equipment or supplies? No  Functional Questionnaire: (I = Independent and D = Dependent) ADLs: I  Bathing/Dressing- I  Meal Prep- I  Eating- I  Maintaining continence- I  Transferring/Ambulation- I  Managing Meds- I  Follow up appointments reviewed:  PCP Hospital f/u appt confirmed? Yes  Scheduled to see Dr Yong Channel on 05/19/2022@ 10:00. [facility] f/u appt confirmed? Yes  Scheduled to see Dr Candee Furbish on 05/15/2022 @ 10:00. Are transportation arrangements needed? No  If their condition worsens, is the pt aware to call PCP or go to the Emergency Dept.? Yes Was the patient provided with contact information for the PCP's office or ED? Yes Was to pt encouraged to call back with questions or concerns? Yes Juanda Crumble, LPN [place] Direct Dial [phone]

## 2022-05-13 NOTE — Telephone Encounter (Signed)
Noted thanks. Hospital follow up in his physical spot 05/19/22- would he prefer to focus on hospitalization or still try to do cpe? Please comment in the notes section for that visit

## 2022-05-14 ENCOUNTER — Other Ambulatory Visit (HOSPITAL_BASED_OUTPATIENT_CLINIC_OR_DEPARTMENT_OTHER): Payer: Self-pay

## 2022-05-15 DIAGNOSIS — N3041 Irradiation cystitis with hematuria: Secondary | ICD-10-CM | POA: Diagnosis not present

## 2022-05-19 ENCOUNTER — Ambulatory Visit (INDEPENDENT_AMBULATORY_CARE_PROVIDER_SITE_OTHER): Payer: Medicare PPO | Admitting: Family Medicine

## 2022-05-19 ENCOUNTER — Encounter: Payer: Self-pay | Admitting: Family Medicine

## 2022-05-19 VITALS — BP 138/60 | HR 91 | Temp 98.7°F | Ht 72.0 in | Wt 223.6 lb

## 2022-05-19 DIAGNOSIS — L600 Ingrowing nail: Secondary | ICD-10-CM | POA: Diagnosis not present

## 2022-05-19 DIAGNOSIS — Z0000 Encounter for general adult medical examination without abnormal findings: Secondary | ICD-10-CM | POA: Diagnosis not present

## 2022-05-19 DIAGNOSIS — E785 Hyperlipidemia, unspecified: Secondary | ICD-10-CM

## 2022-05-19 NOTE — Progress Notes (Signed)
Phone: [phone]   Subjective:  Patient presents today for their annual physical. Chief complaint-noted.   See problem oriented charting- ROS- full  review of systems was completed and negative  except for: hematuria- thankfully no more, ongoing fatigue, ingrown nails- bothers him with working out  The following were reviewed and entered/updated in epic: Past Medical History:  Diagnosis Date   Arthritis    OA AND PAIN LEFT HIP   CHB (complete heart block) ([place])    a. MDT dual chamber pacemaker   HTN (hypertension)    Hyperlipidemia    Presence of permanent cardiac pacemaker    2000   Prostate cancer ([place]) 09/2014   Patient Active Problem List   Diagnosis Date Noted   Prostate cancer ([place]) 09/12/2014    Priority: High   PPM-[employer] 05/23/2010    Priority: High   Atrioventricular block, complete ([place]) 03/28/2009    Priority: High   History of adenomatous polyp of colon 04/15/2019    Priority: Medium    Hyperglycemia 06/12/2015    Priority: Medium    Hyperlipidemia 03/28/2009    Priority: Medium    Essential hypertension 03/16/2008    Priority: Medium    S/P right THA, AA 01/05/2018    Priority: Low   Obese 07/04/2014    Priority: Low   S/P left THA, AA 07/03/2014    Priority: Low   Symptomatic anemia 05/05/2022   S/P reverse total shoulder arthroplasty, left 10/15/2021   COVID-19 07/11/2021   Gross hematuria 12/14/2020   Palpitations 03/15/2019   Positive colorectal cancer screening using Cologuard test    Past Surgical History:  Procedure Laterality Date   ADENOIDECTOMY AS A CHILD     COLONOSCOPY WITH PROPOFOL N/A 12/30/2017   Procedure: COLONOSCOPY WITH PROPOFOL;  Surgeon: Ladene Artist, MD;  [facility];  Service: Endoscopy;  Laterality: N/A;   JOINT REPLACEMENT     Right hip replacement Dr. Alvan Dame 01-05-18   LYMPHADENECTOMY Bilateral 11/06/2014   Procedure: PELVIC LYMPHADENECTOMY;  Surgeon: [name], MD;  [facility];  Service:  Urology;  Laterality: Bilateral;   PACEMAKER INSERTION  2000; 2012   MDT dual chamber pacemaker implanted 2000 with gen change 2012   POLYPECTOMY  12/30/2017   Procedure: POLYPECTOMY;  Surgeon: Ladene Artist, MD;  [facility];  Service: Endoscopy;;   PROSTATE BIOPSY  09/05/14   REVERSE SHOULDER ARTHROPLASTY Left 10/15/2021   Procedure: REVERSE SHOULDER ARTHROPLASTY;  Surgeon: Justice Britain, MD;  [facility];  Service: Orthopedics;  Laterality: Left;   ROBOT ASSISTED LAPAROSCOPIC RADICAL PROSTATECTOMY N/A 11/06/2014   Procedure: ROBOTIC ASSISTED LAPAROSCOPIC RADICAL PROSTATECTOMY LEVEL 2;  Surgeon: [name], MD;  [facility];  Service: Urology;  Laterality: N/A;   TOTAL HIP ARTHROPLASTY Left 07/03/2014   Procedure: LEFT TOTAL HIP ARTHROPLASTY ANTERIOR APPROACH;  Surgeon: [name], MD;  [facility];  Service: Orthopedics;  Laterality: Left;   TOTAL HIP ARTHROPLASTY Right 01/05/2018   Procedure: RIGHT TOTAL HIP ARTHROPLASTY ANTERIOR APPROACH;  Surgeon: Paralee Cancel, MD;  [facility];  Service: Orthopedics;  Laterality: Right;  70 mins   WISDOM TEETH EXTRACTIONS      Family History  Problem Relation Age of Onset   Stroke Mother        multiple. 5.    Heart disease Mother        hx heart valve problem   Prostate cancer Father        62 of prostate cancer  Heart attack Neg Hx    Hypertension Neg Hx     Medications- reviewed and updated Current Outpatient Medications  Medication Sig Dispense Refill   acetaminophen (TYLENOL) 325 MG tablet Take 650 mg by mouth every 6 (six) hours as needed for mild pain or headache.     amoxicillin (AMOXIL) 500 MG capsule Take 4 capsules by mouth 1 hour prior to dental appointment for pre-medication. (Patient taking differently: Take 2,000 mg by mouth See admin instructions. Take 2,000 mg by mouth one hour prior to dental appointments for pre-medication) 12 capsule 0   atorvastatin (LIPITOR) 20 MG tablet Take 1  tablet by mouth once daily (Patient taking differently: Take 20 mg by mouth daily.) 90 tablet 3   calcium carbonate (OS-CAL - DOSED IN MG OF ELEMENTAL CALCIUM) 1250 (500 Ca) MG tablet Take 1 tablet by mouth 2 (two) times a week.     denosumab (XGEVA) 120 MG/1.7ML SOLN injection Inject 120 mg into the skin every 2 (two) months.     ferrous sulfate 325 (65 FE) MG tablet Take 325 mg by mouth daily with breakfast.     leuprolide, 6 Month, (ELIGARD) 45 MG injection Inject 45 mg into the skin every 6 (six) months.     predniSONE (DELTASONE) 10 MG tablet Take 1 tablet (10 mg total) by mouth every morning. 30 tablet 11   prochlorperazine (COMPAZINE) 10 MG tablet Take 1 tablet (10 mg total) by mouth every 6 (six) hours as needed as needed for nausea or vomiting. 30 tablet 8   valsartan (DIOVAN) 160 MG tablet Take 1 tablet (160 mg total) by mouth daily. 90 tablet 3   No current facility-administered medications for this visit.    Allergies-reviewed and updated No Known Allergies  Social History   Social History Narrative   Married to Dr. [name] 1979. 4 kids (1 lost to auto accident-3 surviving), no grandkids. 1 daughter married in 2016 and works as Forensic psychologist in [place].       Retired Forensic psychologist       Hobbies: [place], vacation   Objective  Objective:  BP 138/60   Pulse 91   Temp 98.7 F (37.1 C)   Ht 6' (1.829 m)   Wt 223 lb 9.6 oz (101.4 kg)   SpO2 97%   BMI 30.33 kg/m  Gen: NAD, resting comfortably HEENT: Mucous membranes are moist. Oropharynx normal Neck: no thyromegaly CV: RRR no murmurs rubs or gallops Lungs: CTAB no crackles, wheeze, rhonchi Abdomen: soft/nontender/nondistended/normal bowel sounds. No rebound or guarding.  Ext: no edema Skin: warm, dry Neuro: grossly normal, moves all extremities, PERRLA   Assessment and Plan  71 y.o. male presenting for annual physical.  Health Maintenance counseling: 1. Anticipatory guidance: Patient counseled regarding regular dental  exams -q6 months, eye exams -yearly,  avoiding smoking and second hand smoke , limiting alcohol to 2 beverages per day - stopped drinking almost completely, no illicit drugs .   2. Risk factor reduction:  Advised patient of need for regular exercise and diet rich and fruits and vegetables to reduce risk of heart attack and stroke.  Exercise- gym 2-3 days a week Diet/weight management-Down 17 pounds from last physical- feels like related to medical illness.  Wt Readings from Last 3 Encounters:  05/19/22 223 lb 9.6 oz (101.4 kg)  05/09/22 221 lb 1.9 oz (100.3 kg)  05/01/22 230 lb (104.3 kg)  3. Immunizations/screenings/ancillary studies-wnts to hold off on Shingrix since had Zostavax, discussed [facility], updated  COVID-19 vaccination-just had covid in october so will hold off on vaccination, discussed Prevnar 20, discussed RSV- opts out for now  Immunization History  Administered Date(s) Administered   Influenza Split 06/06/2011, 05/13/2012   Influenza Whole 05/24/2008, 07/05/2009, 05/21/2010   Influenza, High Dose Seasonal PF 05/05/2014, 05/11/2015, 05/23/2016, 04/24/2017, 05/07/2018, 03/24/2019, 03/26/2020   Influenza,inj,Quad PF,6+ Mos 05/13/2013   Moderna SARS-COV2 Booster Vaccination 09/27/2020   Moderna Sars-Covid-2 Vaccination 07/22/2019, 08/22/2019   Pneumococcal Conjugate-13 05/29/2014   Pneumococcal Polysaccharide-23 05/15/2017   Td 03/28/2009   Zoster, Live 06/10/2013  4. Prostate cancer - continues to follow-up with [facility]-castration resistant but on eligard still  Currently needs blood work from [facility] and on chemotherapy Pluvicto- with plan to change to cabazitaxel coming up Lab Results  Component Value Date   PSA 173.00 Manually diluted... (H) 04/24/2022   PSA 59.82 (H) 03/27/2022   PSA 51.47 (H) 03/18/2022   5. Colon cancer screening - colonoscopy in 2019-technically due at this time but with ongoing prostate cancer battle  6. Skin cancer screening-Dr.  June Leap dermatology- about a year ago. advised regular sunscreen use. Denies worrisome, changing, or new skin lesions.  7. Smoking associated screening (lung cancer screening, AAA screen 65-75, UA)-never smoker 8. STD screening -only active with wife  Status of chronic or acute concerns   #Recent hospitalization for hematuria despite indwelling Foley catheter-hemoglobin was as low as 6.5 and he received 2 units of packed red blood cells with hemoglobin up to 8.9 peak but down to 8.2 before discharge.  He required bladder irrigation for 4 days  with larger cath and Foley catheter was ultimately removed-anticipate hematuria but with worsening bleeding or urinary retention was to return to care-she reports and urology follow-up. Thankfully has not had recurrent bleeding. Had scope with [name] since discharge- plan is for hyperbaric oxygen.   - he will be getting [facility]work with [facility] on 14th and wants to hold off today  #Pancytopenia-related to chemotherapy with Pluvicto (plus psa trending up) and this will be switche-d will be checked on 14th with [facility]  #AV block with pacemaker in place-follows with Dr. Lindwood Coke year was told 2 years left on battery - just checked and told 2-2.5 years left  #shoulder pain- no longer seeing surgeon- just working on therapy at the gym  #hyperlipidemia S: Medication: Atorvastatin 20 mg daily Lab Results  Component Value Date   CHOL 173 05/01/2020   HDL 54 05/01/2020   [place] 98 05/01/2020   LDLDIRECT 168.8 05/13/2013   TRIG 115 05/01/2020   CHOLHDL 3.2 05/01/2020   A/P: Due for updated lipid panel-ordered with labs today-unlikely to change dose  #hypertension S: medication: valsartan '160mg'$  -On hydrochlorothiazide in the past but had urinary frequency and incontinence so we stopped Home readings #s: similar 130s/60s- was lower in hospital BP Readings from Last 3 Encounters:  05/19/22 138/60  05/09/22 114/69  05/01/22 131/77  A/P:  reasonably controleld- continue current meds   #Osteoporosis-on xvega/denosumab q6 weeks through [facility]- also on calcium/vitamin D   #Ingrown nails- refer to podiatry- started with Pluvicto  Ibuprofen tries to avoid   Recommended follow up: Return in about 1 year (around 05/20/2023) for physical or sooner if needed.Schedule b4 you leave. Future Appointments  Date Time Provider [place]  07/23/2022 12:15 PM [facility] DEVICE REMOTES [facility]  10/22/2022 12:15 PM [facility] DEVICE REMOTES [facility]  01/21/2023 12:15 PM [facility] DEVICE REMOTES [facility]   Lab/Order associations:NOT fasting- will [facility] labs [facility]  on [facility]th   ICD-10-CM   1. Preventative health care  Z00.00     2. Hyperlipidemia, unspecified hyperlipidemia type  E78.5 Lipid panel      No orders of the defined types were placed in this encounter.   Return precautions advised.  Garret Reddish, MD

## 2022-05-19 NOTE — Patient Instructions (Addendum)
You are eligible to schedule your annual wellness visit with our nurse specialist Otila Kluver.  Please consider scheduling this before you leave today  We will call you within two weeks about your referral to podiatry. If you do not hear within 2 weeks, give [place] a call.   Prevnar 13 in 2015 and pneumovax 23 in 2018. You are eligible if you would like to do prevnar 20 as well- chat with your wife and let us know or can get at pharmacy  See if [facility] can do the lipid panel for [place] (see printout)   Recommended follow up: Return in about 1 year (around 05/20/2023) for physical or sooner if needed.Schedule b4 you leave.

## 2022-05-27 ENCOUNTER — Other Ambulatory Visit (HOSPITAL_BASED_OUTPATIENT_CLINIC_OR_DEPARTMENT_OTHER): Payer: Self-pay

## 2022-05-27 DIAGNOSIS — D6182 Myelophthisis: Secondary | ICD-10-CM | POA: Diagnosis not present

## 2022-05-27 DIAGNOSIS — D630 Anemia in neoplastic disease: Secondary | ICD-10-CM | POA: Diagnosis not present

## 2022-05-27 DIAGNOSIS — E785 Hyperlipidemia, unspecified: Secondary | ICD-10-CM | POA: Diagnosis not present

## 2022-05-27 DIAGNOSIS — D709 Neutropenia, unspecified: Secondary | ICD-10-CM | POA: Diagnosis not present

## 2022-05-27 DIAGNOSIS — I10 Essential (primary) hypertension: Secondary | ICD-10-CM | POA: Diagnosis not present

## 2022-05-27 DIAGNOSIS — C61 Malignant neoplasm of prostate: Secondary | ICD-10-CM | POA: Diagnosis not present

## 2022-05-27 DIAGNOSIS — G893 Neoplasm related pain (acute) (chronic): Secondary | ICD-10-CM | POA: Diagnosis not present

## 2022-05-27 DIAGNOSIS — C7951 Secondary malignant neoplasm of bone: Secondary | ICD-10-CM | POA: Diagnosis not present

## 2022-05-27 DIAGNOSIS — N3040 Irradiation cystitis without hematuria: Secondary | ICD-10-CM | POA: Diagnosis not present

## 2022-05-27 DIAGNOSIS — D61818 Other pancytopenia: Secondary | ICD-10-CM | POA: Diagnosis not present

## 2022-05-27 MED ORDER — TRAMADOL HCL 50 MG PO TABS
50.0000 mg | ORAL_TABLET | Freq: Three times a day (TID) | ORAL | 0 refills | Status: AC | PRN
  Filled 2022-05-27: qty 60, 20d supply, fill #0

## 2022-05-29 ENCOUNTER — Encounter: Payer: Self-pay | Admitting: Podiatry

## 2022-05-29 ENCOUNTER — Ambulatory Visit: Payer: Medicare PPO | Admitting: Podiatry

## 2022-05-29 DIAGNOSIS — R972 Elevated prostate specific antigen [PSA]: Secondary | ICD-10-CM | POA: Insufficient documentation

## 2022-05-29 DIAGNOSIS — L03031 Cellulitis of right toe: Secondary | ICD-10-CM

## 2022-05-29 DIAGNOSIS — M79676 Pain in unspecified toe(s): Secondary | ICD-10-CM | POA: Diagnosis not present

## 2022-05-29 DIAGNOSIS — B351 Tinea unguium: Secondary | ICD-10-CM | POA: Diagnosis not present

## 2022-05-29 DIAGNOSIS — N529 Male erectile dysfunction, unspecified: Secondary | ICD-10-CM | POA: Insufficient documentation

## 2022-05-29 NOTE — Progress Notes (Signed)
Subjective:  Patient ID: Devin Mercado, male    DOB: Dec 17, 1950,  MRN: [id number] HPI Chief Complaint  Patient presents with   Nail Problem    Hallux nails bilateral - noticed nails got thicker and darker after chemo, starting different chemo again next week, lost strength in hands and unable to trim himself   New Patient (Initial Visit)    71 y.o. male presents with the above complaint.   ROS: Denies fever chills nausea vomiting muscle aches pains calf pain back pain chest pain shortness of breath.  History of prostate cancer with mets.  Currently between chemotherapy has been getting ready to start a new chemotherapy that will be every 3 weeks for the next 30 weeks.  Past Medical History:  Diagnosis Date   Arthritis    OA AND PAIN LEFT HIP   CHB (complete heart block) ([place])    a. MDT dual chamber pacemaker   HTN (hypertension)    Hyperlipidemia    Presence of permanent cardiac pacemaker    2000   Prostate cancer ([place]) 09/2014   Past Surgical History:  Procedure Laterality Date   ADENOIDECTOMY AS A CHILD     COLONOSCOPY WITH PROPOFOL N/A 12/30/2017   Procedure: COLONOSCOPY WITH PROPOFOL;  Surgeon: Ladene Artist, MD;  [facility];  Service: Endoscopy;  Laterality: N/A;   JOINT REPLACEMENT     Right hip replacement Dr. Alvan Dame 01-05-18   LYMPHADENECTOMY Bilateral 11/06/2014   Procedure: PELVIC LYMPHADENECTOMY;  Surgeon: [name], MD;  [facility];  Service: Urology;  Laterality: Bilateral;   PACEMAKER INSERTION  2000; 2012   MDT dual chamber pacemaker implanted 2000 with gen change 2012   POLYPECTOMY  12/30/2017   Procedure: POLYPECTOMY;  Surgeon: Ladene Artist, MD;  [facility];  Service: Endoscopy;;   PROSTATE BIOPSY  09/05/14   REVERSE SHOULDER ARTHROPLASTY Left 10/15/2021   Procedure: REVERSE SHOULDER ARTHROPLASTY;  Surgeon: Justice Britain, MD;  [facility];  Service: Orthopedics;  Laterality: Left;   ROBOT ASSISTED LAPAROSCOPIC  RADICAL PROSTATECTOMY N/A 11/06/2014   Procedure: ROBOTIC ASSISTED LAPAROSCOPIC RADICAL PROSTATECTOMY LEVEL 2;  Surgeon: [name], MD;  [facility];  Service: Urology;  Laterality: N/A;   TOTAL HIP ARTHROPLASTY Left 07/03/2014   Procedure: LEFT TOTAL HIP ARTHROPLASTY ANTERIOR APPROACH;  Surgeon: [name], MD;  [facility];  Service: Orthopedics;  Laterality: Left;   TOTAL HIP ARTHROPLASTY Right 01/05/2018   Procedure: RIGHT TOTAL HIP ARTHROPLASTY ANTERIOR APPROACH;  Surgeon: Paralee Cancel, MD;  [facility];  Service: Orthopedics;  Laterality: Right;  70 mins   WISDOM TEETH EXTRACTIONS      Current Outpatient Medications:    acetaminophen (TYLENOL) 325 MG tablet, Take 650 mg by mouth every 6 (six) hours as needed for mild pain or headache., Disp: , Rfl:    amoxicillin (AMOXIL) 500 MG capsule, Take 4 capsules by mouth 1 hour prior to dental appointment for pre-medication. (Patient taking differently: Take 2,000 mg by mouth See admin instructions. Take 2,000 mg by mouth one hour prior to dental appointments for pre-medication), Disp: 12 capsule, Rfl: 0   atorvastatin (LIPITOR) 20 MG tablet, Take 1 tablet by mouth once daily (Patient taking differently: Take 20 mg by mouth daily.), Disp: 90 tablet, Rfl: 3   calcium carbonate (OS-CAL - DOSED IN MG OF ELEMENTAL CALCIUM) 1250 (500 Ca) MG tablet, Take 1 tablet by mouth 2 (two) times a week., Disp: , Rfl:    denosumab (XGEVA) 120 MG/1.7ML  SOLN injection, Inject 120 mg into the skin every 2 (two) months., Disp: , Rfl:    ferrous sulfate 325 (65 FE) MG tablet, Take 325 mg by mouth daily with breakfast., Disp: , Rfl:    leuprolide, 6 Month, (ELIGARD) 45 MG injection, Inject 45 mg into the skin every 6 (six) months., Disp: , Rfl:    predniSONE (DELTASONE) 10 MG tablet, Take 1 tablet (10 mg total) by mouth every morning., Disp: 30 tablet, Rfl: 11   prochlorperazine (COMPAZINE) 10 MG tablet, Take 1 tablet (10 mg total) by mouth every 6  (six) hours as needed as needed for nausea or vomiting., Disp: 30 tablet, Rfl: 8   traMADol (ULTRAM) 50 MG tablet, Take 1 tablet (50 mg total) by mouth every 8 (eight) hours as needed for Pain for up to 20 days, Disp: 60 tablet, Rfl: 0   valsartan (DIOVAN) 160 MG tablet, Take 1 tablet (160 mg total) by mouth daily., Disp: 90 tablet, Rfl: 3  No Known Allergies Review of Systems Objective:  There were no vitals filed for this visit.  General: Well developed, nourished, in no acute distress, alert and oriented x3   Dermatological: Skin is warm, dry and supple bilateral. Nails x 10 are well maintained; remaining integument appears unremarkable at this time. There are no open sores, no preulcerative lesions, no rash or signs of infection present.  His hallux nails however are thick yellow and dystrophic sharply incurvated the fibular border of the hallux right does demonstrate erythema with a small abscess distally.  Vascular: Dorsalis Pedis artery and Posterior Tibial artery pedal pulses are 2/4 bilateral with immedate capillary fill time. Pedal hair growth present. No varicosities and no lower extremity edema present bilateral.   Neruologic: Grossly intact via light touch bilateral. Vibratory intact via tuning fork bilateral. Protective threshold with Semmes Wienstein monofilament diminished to all pedal sites bilateral. Patellar and Achilles deep tendon reflexes 2+ bilateral. No Babinski or clonus noted bilateral.   Musculoskeletal: No gross boney pedal deformities bilateral. No pain, crepitus, or limitation noted with foot and ankle range of motion bilateral. Muscular strength 5/5 in all groups tested bilateral.  Gait: Unassisted, Nonantalgic.    Radiographs:  None taken  Assessment & Plan:   Assessment: Ingrown toenail with nail dystrophy bilaterally.  Hallux right most significant fibular border.  Mild paronychia present.  Plan: We discussed etiology pathology conservative versus  surgical therapies.  At this point we performed a incision and drainage slant back after anesthesia was administered to the hallux.  Tolerated procedure well all necrotic tissue was removed.  Have a small spicule of nail sticking in the distal aspect of the toe most likely resulting in the small abscess.  Otherwise I debrided the remainder of the nails as well we dressed the hallux right with a dressed a compressive dressing he will start soaking tomorrow if necessary.  Follow-up with him on an as-needed basis.     Annaclaire Walsworth T. [place], [place]

## 2022-06-04 DIAGNOSIS — D6182 Myelophthisis: Secondary | ICD-10-CM | POA: Diagnosis not present

## 2022-06-04 DIAGNOSIS — G893 Neoplasm related pain (acute) (chronic): Secondary | ICD-10-CM | POA: Diagnosis not present

## 2022-06-04 DIAGNOSIS — D696 Thrombocytopenia, unspecified: Secondary | ICD-10-CM | POA: Diagnosis not present

## 2022-06-04 DIAGNOSIS — Z9079 Acquired absence of other genital organ(s): Secondary | ICD-10-CM | POA: Diagnosis not present

## 2022-06-04 DIAGNOSIS — C61 Malignant neoplasm of prostate: Secondary | ICD-10-CM | POA: Diagnosis not present

## 2022-06-04 DIAGNOSIS — D61818 Other pancytopenia: Secondary | ICD-10-CM | POA: Diagnosis not present

## 2022-06-04 DIAGNOSIS — I10 Essential (primary) hypertension: Secondary | ICD-10-CM | POA: Diagnosis not present

## 2022-06-04 DIAGNOSIS — N3040 Irradiation cystitis without hematuria: Secondary | ICD-10-CM | POA: Diagnosis not present

## 2022-06-04 DIAGNOSIS — C7951 Secondary malignant neoplasm of bone: Secondary | ICD-10-CM | POA: Diagnosis not present

## 2022-06-09 ENCOUNTER — Other Ambulatory Visit (HOSPITAL_BASED_OUTPATIENT_CLINIC_OR_DEPARTMENT_OTHER): Payer: Self-pay

## 2022-06-12 ENCOUNTER — Other Ambulatory Visit (HOSPITAL_BASED_OUTPATIENT_CLINIC_OR_DEPARTMENT_OTHER): Payer: Self-pay

## 2022-06-12 ENCOUNTER — Encounter (HOSPITAL_COMMUNITY): Payer: Self-pay

## 2022-06-12 ENCOUNTER — Inpatient Hospital Stay (HOSPITAL_BASED_OUTPATIENT_CLINIC_OR_DEPARTMENT_OTHER)
Admission: EM | Admit: 2022-06-12 | Discharge: 2022-07-14 | DRG: 662 | Disposition: E | Payer: Medicare PPO | Attending: Neurology | Admitting: Neurology

## 2022-06-12 ENCOUNTER — Other Ambulatory Visit: Payer: Self-pay

## 2022-06-12 ENCOUNTER — Encounter (HOSPITAL_BASED_OUTPATIENT_CLINIC_OR_DEPARTMENT_OTHER): Payer: Self-pay

## 2022-06-12 DIAGNOSIS — N289 Disorder of kidney and ureter, unspecified: Secondary | ICD-10-CM | POA: Diagnosis not present

## 2022-06-12 DIAGNOSIS — C7951 Secondary malignant neoplasm of bone: Secondary | ICD-10-CM | POA: Diagnosis present

## 2022-06-12 DIAGNOSIS — R414 Neurologic neglect syndrome: Secondary | ICD-10-CM | POA: Diagnosis not present

## 2022-06-12 DIAGNOSIS — E871 Hypo-osmolality and hyponatremia: Secondary | ICD-10-CM | POA: Diagnosis present

## 2022-06-12 DIAGNOSIS — H518 Other specified disorders of binocular movement: Secondary | ICD-10-CM | POA: Diagnosis not present

## 2022-06-12 DIAGNOSIS — M4316 Spondylolisthesis, lumbar region: Secondary | ICD-10-CM | POA: Diagnosis not present

## 2022-06-12 DIAGNOSIS — R0602 Shortness of breath: Secondary | ICD-10-CM | POA: Diagnosis not present

## 2022-06-12 DIAGNOSIS — Z950 Presence of cardiac pacemaker: Secondary | ICD-10-CM | POA: Diagnosis present

## 2022-06-12 DIAGNOSIS — D181 Lymphangioma, any site: Secondary | ICD-10-CM | POA: Diagnosis not present

## 2022-06-12 DIAGNOSIS — R54 Age-related physical debility: Secondary | ICD-10-CM | POA: Diagnosis present

## 2022-06-12 DIAGNOSIS — I10 Essential (primary) hypertension: Secondary | ICD-10-CM | POA: Diagnosis present

## 2022-06-12 DIAGNOSIS — N3041 Irradiation cystitis with hematuria: Secondary | ICD-10-CM | POA: Diagnosis not present

## 2022-06-12 DIAGNOSIS — Z9221 Personal history of antineoplastic chemotherapy: Secondary | ICD-10-CM

## 2022-06-12 DIAGNOSIS — I6523 Occlusion and stenosis of bilateral carotid arteries: Secondary | ICD-10-CM | POA: Diagnosis present

## 2022-06-12 DIAGNOSIS — H53462 Homonymous bilateral field defects, left side: Secondary | ICD-10-CM | POA: Diagnosis present

## 2022-06-12 DIAGNOSIS — M545 Low back pain, unspecified: Secondary | ICD-10-CM | POA: Diagnosis not present

## 2022-06-12 DIAGNOSIS — I480 Paroxysmal atrial fibrillation: Secondary | ICD-10-CM | POA: Diagnosis not present

## 2022-06-12 DIAGNOSIS — T451X5A Adverse effect of antineoplastic and immunosuppressive drugs, initial encounter: Secondary | ICD-10-CM | POA: Diagnosis present

## 2022-06-12 DIAGNOSIS — R220 Localized swelling, mass and lump, head: Secondary | ICD-10-CM | POA: Diagnosis not present

## 2022-06-12 DIAGNOSIS — I63411 Cerebral infarction due to embolism of right middle cerebral artery: Secondary | ICD-10-CM | POA: Diagnosis not present

## 2022-06-12 DIAGNOSIS — R310 Gross hematuria: Secondary | ICD-10-CM | POA: Diagnosis not present

## 2022-06-12 DIAGNOSIS — I639 Cerebral infarction, unspecified: Secondary | ICD-10-CM | POA: Diagnosis not present

## 2022-06-12 DIAGNOSIS — D6869 Other thrombophilia: Secondary | ICD-10-CM | POA: Diagnosis present

## 2022-06-12 DIAGNOSIS — C61 Malignant neoplasm of prostate: Secondary | ICD-10-CM | POA: Diagnosis present

## 2022-06-12 DIAGNOSIS — R338 Other retention of urine: Secondary | ICD-10-CM | POA: Diagnosis not present

## 2022-06-12 DIAGNOSIS — D84821 Immunodeficiency due to drugs: Secondary | ICD-10-CM | POA: Diagnosis present

## 2022-06-12 DIAGNOSIS — I63511 Cerebral infarction due to unspecified occlusion or stenosis of right middle cerebral artery: Secondary | ICD-10-CM | POA: Diagnosis present

## 2022-06-12 DIAGNOSIS — M47816 Spondylosis without myelopathy or radiculopathy, lumbar region: Secondary | ICD-10-CM | POA: Diagnosis not present

## 2022-06-12 DIAGNOSIS — Z66 Do not resuscitate: Secondary | ICD-10-CM | POA: Diagnosis not present

## 2022-06-12 DIAGNOSIS — D6859 Other primary thrombophilia: Secondary | ICD-10-CM | POA: Diagnosis present

## 2022-06-12 DIAGNOSIS — Z515 Encounter for palliative care: Secondary | ICD-10-CM | POA: Diagnosis not present

## 2022-06-12 DIAGNOSIS — I878 Other specified disorders of veins: Secondary | ICD-10-CM | POA: Diagnosis not present

## 2022-06-12 DIAGNOSIS — R197 Diarrhea, unspecified: Secondary | ICD-10-CM | POA: Diagnosis not present

## 2022-06-12 DIAGNOSIS — D61818 Other pancytopenia: Secondary | ICD-10-CM | POA: Diagnosis not present

## 2022-06-12 DIAGNOSIS — I629 Nontraumatic intracranial hemorrhage, unspecified: Secondary | ICD-10-CM | POA: Diagnosis not present

## 2022-06-12 DIAGNOSIS — E669 Obesity, unspecified: Secondary | ICD-10-CM | POA: Diagnosis present

## 2022-06-12 DIAGNOSIS — R29719 NIHSS score 19: Secondary | ICD-10-CM | POA: Diagnosis not present

## 2022-06-12 DIAGNOSIS — E785 Hyperlipidemia, unspecified: Secondary | ICD-10-CM | POA: Diagnosis present

## 2022-06-12 DIAGNOSIS — I611 Nontraumatic intracerebral hemorrhage in hemisphere, cortical: Secondary | ICD-10-CM | POA: Diagnosis not present

## 2022-06-12 DIAGNOSIS — K8020 Calculus of gallbladder without cholecystitis without obstruction: Secondary | ICD-10-CM | POA: Diagnosis not present

## 2022-06-12 DIAGNOSIS — Z923 Personal history of irradiation: Secondary | ICD-10-CM

## 2022-06-12 DIAGNOSIS — I6601 Occlusion and stenosis of right middle cerebral artery: Secondary | ICD-10-CM | POA: Diagnosis not present

## 2022-06-12 DIAGNOSIS — I959 Hypotension, unspecified: Secondary | ICD-10-CM | POA: Diagnosis not present

## 2022-06-12 DIAGNOSIS — I4891 Unspecified atrial fibrillation: Secondary | ICD-10-CM | POA: Diagnosis not present

## 2022-06-12 DIAGNOSIS — D62 Acute posthemorrhagic anemia: Secondary | ICD-10-CM | POA: Diagnosis present

## 2022-06-12 DIAGNOSIS — R451 Restlessness and agitation: Secondary | ICD-10-CM | POA: Diagnosis not present

## 2022-06-12 DIAGNOSIS — G8194 Hemiplegia, unspecified affecting left nondominant side: Secondary | ICD-10-CM | POA: Diagnosis not present

## 2022-06-12 DIAGNOSIS — Z79899 Other long term (current) drug therapy: Secondary | ICD-10-CM

## 2022-06-12 DIAGNOSIS — G9608 Other cranial cerebrospinal fluid leak: Secondary | ICD-10-CM | POA: Diagnosis not present

## 2022-06-12 DIAGNOSIS — R319 Hematuria, unspecified: Secondary | ICD-10-CM | POA: Diagnosis present

## 2022-06-12 DIAGNOSIS — I63542 Cerebral infarction due to unspecified occlusion or stenosis of left cerebellar artery: Secondary | ICD-10-CM | POA: Diagnosis not present

## 2022-06-12 DIAGNOSIS — R29818 Other symptoms and signs involving the nervous system: Secondary | ICD-10-CM | POA: Diagnosis not present

## 2022-06-12 DIAGNOSIS — E876 Hypokalemia: Secondary | ICD-10-CM | POA: Diagnosis not present

## 2022-06-12 DIAGNOSIS — Z538 Procedure and treatment not carried out for other reasons: Secondary | ICD-10-CM | POA: Diagnosis not present

## 2022-06-12 DIAGNOSIS — R140 Abdominal distension (gaseous): Secondary | ICD-10-CM | POA: Diagnosis not present

## 2022-06-12 DIAGNOSIS — Z9079 Acquired absence of other genital organ(s): Secondary | ICD-10-CM

## 2022-06-12 DIAGNOSIS — I63539 Cerebral infarction due to unspecified occlusion or stenosis of unspecified posterior cerebral artery: Secondary | ICD-10-CM | POA: Diagnosis not present

## 2022-06-12 DIAGNOSIS — Y842 Radiological procedure and radiotherapy as the cause of abnormal reaction of the patient, or of later complication, without mention of misadventure at the time of the procedure: Secondary | ICD-10-CM | POA: Diagnosis present

## 2022-06-12 DIAGNOSIS — Z7952 Long term (current) use of systemic steroids: Secondary | ICD-10-CM

## 2022-06-12 DIAGNOSIS — F419 Anxiety disorder, unspecified: Secondary | ICD-10-CM | POA: Diagnosis present

## 2022-06-12 DIAGNOSIS — Z8616 Personal history of COVID-19: Secondary | ICD-10-CM

## 2022-06-12 DIAGNOSIS — G936 Cerebral edema: Secondary | ICD-10-CM | POA: Diagnosis not present

## 2022-06-12 DIAGNOSIS — Z96643 Presence of artificial hip joint, bilateral: Secondary | ICD-10-CM | POA: Diagnosis present

## 2022-06-12 DIAGNOSIS — R339 Retention of urine, unspecified: Secondary | ICD-10-CM | POA: Diagnosis not present

## 2022-06-12 DIAGNOSIS — R1310 Dysphagia, unspecified: Secondary | ICD-10-CM | POA: Diagnosis not present

## 2022-06-12 DIAGNOSIS — E0781 Sick-euthyroid syndrome: Secondary | ICD-10-CM | POA: Diagnosis present

## 2022-06-12 DIAGNOSIS — I472 Ventricular tachycardia, unspecified: Secondary | ICD-10-CM | POA: Diagnosis not present

## 2022-06-12 DIAGNOSIS — R5381 Other malaise: Secondary | ICD-10-CM | POA: Diagnosis not present

## 2022-06-12 DIAGNOSIS — G9340 Encephalopathy, unspecified: Secondary | ICD-10-CM | POA: Diagnosis not present

## 2022-06-12 DIAGNOSIS — Z8249 Family history of ischemic heart disease and other diseases of the circulatory system: Secondary | ICD-10-CM

## 2022-06-12 DIAGNOSIS — R29810 Facial weakness: Secondary | ICD-10-CM | POA: Diagnosis not present

## 2022-06-12 DIAGNOSIS — I63432 Cerebral infarction due to embolism of left posterior cerebral artery: Secondary | ICD-10-CM | POA: Diagnosis not present

## 2022-06-12 DIAGNOSIS — G935 Compression of brain: Secondary | ICD-10-CM | POA: Diagnosis not present

## 2022-06-12 DIAGNOSIS — Z96612 Presence of left artificial shoulder joint: Secondary | ICD-10-CM | POA: Diagnosis present

## 2022-06-12 DIAGNOSIS — I5021 Acute systolic (congestive) heart failure: Secondary | ICD-10-CM | POA: Diagnosis not present

## 2022-06-12 DIAGNOSIS — R509 Fever, unspecified: Secondary | ICD-10-CM | POA: Diagnosis not present

## 2022-06-12 DIAGNOSIS — R471 Dysarthria and anarthria: Secondary | ICD-10-CM | POA: Diagnosis not present

## 2022-06-12 DIAGNOSIS — I619 Nontraumatic intracerebral hemorrhage, unspecified: Secondary | ICD-10-CM | POA: Diagnosis not present

## 2022-06-12 DIAGNOSIS — I429 Cardiomyopathy, unspecified: Secondary | ICD-10-CM | POA: Diagnosis present

## 2022-06-12 DIAGNOSIS — I442 Atrioventricular block, complete: Secondary | ICD-10-CM | POA: Diagnosis present

## 2022-06-12 DIAGNOSIS — N3289 Other specified disorders of bladder: Secondary | ICD-10-CM | POA: Diagnosis not present

## 2022-06-12 DIAGNOSIS — Z823 Family history of stroke: Secondary | ICD-10-CM

## 2022-06-12 DIAGNOSIS — I110 Hypertensive heart disease with heart failure: Secondary | ICD-10-CM | POA: Diagnosis present

## 2022-06-12 DIAGNOSIS — R270 Ataxia, unspecified: Secondary | ICD-10-CM | POA: Diagnosis not present

## 2022-06-12 DIAGNOSIS — G9389 Other specified disorders of brain: Secondary | ICD-10-CM | POA: Diagnosis not present

## 2022-06-12 DIAGNOSIS — N3091 Cystitis, unspecified with hematuria: Secondary | ICD-10-CM | POA: Diagnosis not present

## 2022-06-12 DIAGNOSIS — Z8042 Family history of malignant neoplasm of prostate: Secondary | ICD-10-CM

## 2022-06-12 DIAGNOSIS — R4701 Aphasia: Secondary | ICD-10-CM | POA: Diagnosis not present

## 2022-06-12 DIAGNOSIS — Z8673 Personal history of transient ischemic attack (TIA), and cerebral infarction without residual deficits: Secondary | ICD-10-CM | POA: Diagnosis not present

## 2022-06-12 DIAGNOSIS — Z6830 Body mass index (BMI) 30.0-30.9, adult: Secondary | ICD-10-CM

## 2022-06-12 LAB — URINALYSIS, MICROSCOPIC (REFLEX): RBC / HPF: >50 RBC/hpf (ref 0–5)

## 2022-06-12 LAB — CBC
HCT: 21.5 % — ABNORMAL LOW (ref 39.0–52.0)
Hemoglobin: 7.2 g/dL — ABNORMAL LOW (ref 13.0–17.0)
MCH: 27.7 pg (ref 26.0–34.0)
MCHC: 33.5 g/dL (ref 30.0–36.0)
MCV: 82.7 fL (ref 80.0–100.0)
Platelets: 42 10*3/uL — ABNORMAL LOW (ref 150–400)
RBC: 2.6 MIL/uL — ABNORMAL LOW (ref 4.22–5.81)
RDW: 18.2 % — ABNORMAL HIGH (ref 11.5–15.5)
WBC: 3.3 10*3/uL — ABNORMAL LOW (ref 4.0–10.5)
nRBC: 0.6 % — ABNORMAL HIGH (ref 0.0–0.2)

## 2022-06-12 LAB — URINALYSIS, ROUTINE W REFLEX MICROSCOPIC

## 2022-06-12 LAB — COMPREHENSIVE METABOLIC PANEL
ALT: 11 U/L (ref 0–44)
AST: 18 U/L (ref 15–41)
Albumin: 3.6 g/dL (ref 3.5–5.0)
Alkaline Phosphatase: 70 U/L (ref 38–126)
Anion gap: 11 (ref 5–15)
BUN: 36 mg/dL — ABNORMAL HIGH (ref 8–23)
CO2: 20 mmol/L — ABNORMAL LOW (ref 22–32)
Calcium: 7.7 mg/dL — ABNORMAL LOW (ref 8.9–10.3)
Chloride: 96 mmol/L — ABNORMAL LOW (ref 98–111)
Creatinine, Ser: 1.24 mg/dL (ref 0.61–1.24)
GFR, Estimated: >60 mL/min (ref >60)
Glucose, Bld: 94 mg/dL (ref 70–99)
Potassium: 4.4 mmol/L (ref 3.5–5.1)
Sodium: 127 mmol/L — ABNORMAL LOW (ref 135–145)
Total Bilirubin: 0.6 mg/dL (ref 0.3–1.2)
Total Protein: 6.4 g/dL — ABNORMAL LOW (ref 6.5–8.1)

## 2022-06-12 LAB — NA AND K (SODIUM & POTASSIUM), RAND UR
Potassium Urine: 19 mmol/L
Sodium, Ur: 35 mmol/L

## 2022-06-12 LAB — TSH: TSH: 19.527 u[IU]/mL — ABNORMAL HIGH (ref 0.350–4.500)

## 2022-06-12 LAB — ABO/RH: ABO/RH(D): A POS

## 2022-06-12 LAB — LIPASE, BLOOD: Lipase: 10 U/L — ABNORMAL LOW (ref 11–51)

## 2022-06-12 MED ORDER — HYDROCODONE-ACETAMINOPHEN 5-325 MG PO TABS
1.0000 | ORAL_TABLET | Freq: Every evening | ORAL | 0 refills | Status: AC | PRN
  Filled 2022-06-12: qty 6, 6d supply, fill #0

## 2022-06-12 MED ORDER — IBUPROFEN 200 MG PO TABS: 400.0000 mg | ORAL_TABLET | Freq: Four times a day (QID) | ORAL | Status: DC | PRN

## 2022-06-12 MED ORDER — SODIUM CHLORIDE 0.9 % IV BOLUS
1000.0000 mL | Freq: Once | INTRAVENOUS | Status: AC
  Administered 2022-06-12: 1000 mL via INTRAVENOUS

## 2022-06-12 MED ORDER — OXYCODONE HCL 5 MG PO TABS
5.0000 mg | ORAL_TABLET | Freq: Four times a day (QID) | ORAL | Status: DC | PRN
  Administered 2022-06-12: 5 mg via ORAL
  Filled 2022-06-12: qty 1

## 2022-06-12 MED ORDER — SODIUM CHLORIDE 0.9 % IR SOLN
3000.0000 mL | Status: DC
  Administered 2022-06-12 – 2022-06-13 (×3): 3000 mL
  Filled 2022-06-12: qty 3000

## 2022-06-12 MED ORDER — ACETAMINOPHEN 325 MG PO TABS: 650.0000 mg | ORAL_TABLET | Freq: Four times a day (QID) | ORAL | Status: DC | PRN

## 2022-06-12 NOTE — Progress Notes (Signed)
Hospitalist Transfer Note:  Transferring facility: [facility] Requesting provider: Dr. Nechama Guard (EDP at [facility]) Reason for transfer: admission for further evaluation and management of acute urinary retention in the setting of bladder outlet obstruction and gross hematuria.      71  year old male with w/ prostate cancer on chemotherapy, who presented to [facility] ED complaining of inability to urinate.  He does not require an indwelling Foley catheter or prn straight cath at baseline. However, in the setting of difficulty passing urine over the last day, he presented to outpatient urology on the morning of 05/23/2022, at which time a Foley catheter was transiently placed, draining a significant amount of retained urine.  There was reportedly some gross hematuria associated with this morning with some blood clots.  Foley catheter was removed in the urology office, and the patient was subsequently able to successfully urinate following the removal of Foley.   However, as the day progressed he he found that once again he was experiencing difficulty in passing urine, prompting him to present to [facility] this evening for further evaluation.  Bladder scan revealed significant amount of retained urine, prompting placement of Foley catheter prompting spontaneous drainage of similar volume of urine, will also additional gross hematuria.  This time, Foley catheter was left in.  Vital signs in the ED were reported to be stable, including no evidence of tachycardia, with normotensive blood pressures.  Labs were notable for hemoglobin 7.2, down from 8.1 on 10/27 and 8.9 on 10/25.  Labs reportedly also notable for acute hyponatremia, with presenting sodium 127.  Dr. Nechama Guard d/w case with on-call urology (Dr Junious Silk) who requested [facility] admit to [facility], where Dr. Junious Silk conveyed urology will formally consult.    Subsequently, I accepted this patient for transfer for inpatient admission to a med-tele bed at [facility] for further work-up  and management of the above.  .       Check [email or web address] for on-call coverage.   Nursing staff, Please call [place] number on Amion as soon as patient's arrival, so appropriate admitting provider can evaluate the pt.     Babs Bertin, DO Hospitalist

## 2022-06-12 NOTE — ED Notes (Signed)
Pt bladder scanned. Highest amount scanned was 309. Informed Dr. Nechama Guard and Jinny Blossom - RN.

## 2022-06-12 NOTE — ED Notes (Signed)
Patient, belongings, paperwork sent with [facility] to [facility] at this time.

## 2022-06-12 NOTE — ED Provider Notes (Signed)
[facility] EMERGENCY DEPT Provider Note   CSN: [id number] Arrival date & time: 05/18/2022  1659     History  Chief Complaint  Patient presents with   Urinary Retention    ALESSANDER SIKORSKI is a 71 y.o. male.  With PMH of prostate cancer on chemotherapy with last treatment in August 2023 and history of bladder outlet obstruction, radiation cystitis not on [facility] who presents with gross hematuria passing blood clots and urinary retention.  He was just seen at [facility] urology earlier today and had his bladder irrigated and flushed out and was doing fine after initial irrigation and alliance and went home but then realized he started passing blood clots again and has not been able to fully empty his bladder has been having increasing pain and distention within his bladder.  He was told to come back to the ER if it return.  He has had no fevers, no vomiting, no flank pain.  HPI     Home Medications Prior to Admission medications   Medication Sig Start Date End Date Taking? Authorizing Provider  acetaminophen (TYLENOL) 325 MG tablet Take 650 mg by mouth every 6 (six) hours as needed for mild pain or headache.    [provider]  amoxicillin (AMOXIL) 500 MG capsule Take 4 capsules by mouth 1 hour prior to dental appointment for pre-medication. Patient taking differently: Take 2,000 mg by mouth See admin instructions. Take 2,000 mg by mouth one hour prior to dental appointments for pre-medication 02/17/22     atorvastatin (LIPITOR) 20 MG tablet Take 1 tablet by mouth once daily Patient taking differently: Take 20 mg by mouth daily. 07/29/21   [name], MD  calcium carbonate (OS-CAL - DOSED IN MG OF ELEMENTAL CALCIUM) 1250 (500 Ca) MG tablet Take 1 tablet by mouth 2 (two) times a week.    [provider]  denosumab (XGEVA) 120 MG/1.7ML SOLN injection Inject 120 mg into the skin every 2 (two) months.    [provider]  ferrous sulfate 325 (65 FE) MG  tablet Take 325 mg by mouth daily with breakfast.    [provider]  HYDROcodone-acetaminophen (NORCO/VICODIN) 5-325 MG tablet Take 1 tablet by mouth at bedtime as needed. 05/17/2022     leuprolide, 6 Month, (ELIGARD) 45 MG injection Inject 45 mg into the skin every 6 (six) months.    [provider]  predniSONE (DELTASONE) 10 MG tablet Take 1 tablet (10 mg total) by mouth every morning. 05/01/22     prochlorperazine (COMPAZINE) 10 MG tablet Take 1 tablet (10 mg total) by mouth every 6 (six) hours as needed as needed for nausea or vomiting. 05/01/22     traMADol (ULTRAM) 50 MG tablet Take 1 tablet (50 mg total) by mouth every 8 (eight) hours as needed for Pain for up to 20 days 05/27/22     valsartan (DIOVAN) 160 MG tablet Take 1 tablet (160 mg total) by mouth daily. 10/10/21   [name], MD      Allergies    Patient has no known allergies.    Review of Systems   Review of Systems  Physical Exam Updated Vital Signs BP 139/72 (BP Location: Right Arm)   Pulse 97   Temp 100 F (37.8 C) (Oral)   Resp 16   Ht 6' (1.829 m)   Wt 102.1 kg   SpO2 99%   BMI 30.52 kg/m  Physical Exam Constitutional: Alert and oriented.  No acute distress Eyes: Conjunctivae  are normal. ENT      Head: Normocephalic and atraumatic. Cardiovascular: S1, S2, regular rate Respiratory: Normal respiratory effort.  Gastrointestinal: Soft and mildly distended with suprapubic tenderness to palpation and voluntary guarding, no CVA tenderness Musculoskeletal: Normal range of motion in all extremities. Neurologic: Normal speech and language. No gross focal neurologic deficits are appreciated. Skin: Skin is warm, dry and intact. No rash noted. Psychiatric: Mood and affect are normal. Speech and behavior are normal.  ED Results / Procedures / Treatments   Labs (all labs ordered are listed, but only abnormal results are displayed) Labs Reviewed  LIPASE, BLOOD - Abnormal; Notable for the  following components:      Result Value   Lipase 10 (*)    All other components within normal limits  COMPREHENSIVE METABOLIC PANEL - Abnormal; Notable for the following components:   Sodium 127 (*)    Chloride 96 (*)    CO2 20 (*)    BUN 36 (*)    Calcium 7.7 (*)    Total Protein 6.4 (*)    All other components within normal limits  CBC - Abnormal; Notable for the following components:   WBC 3.3 (*)    RBC 2.60 (*)    Hemoglobin 7.2 (*)    HCT 21.5 (*)    RDW 18.2 (*)    Platelets 42 (*)    nRBC 0.6 (*)    All other components within normal limits  URINALYSIS, ROUTINE W REFLEX MICROSCOPIC - Abnormal; Notable for the following components:   Color, Urine RED (*)    APPearance TURBID (*)    Glucose, UA   (*)    Value: TEST NOT REPORTED DUE TO COLOR INTERFERENCE OF URINE PIGMENT   Hgb urine dipstick   (*)    Value: TEST NOT REPORTED DUE TO COLOR INTERFERENCE OF URINE PIGMENT   Bilirubin Urine   (*)    Value: TEST NOT REPORTED DUE TO COLOR INTERFERENCE OF URINE PIGMENT   Ketones, ur   (*)    Value: TEST NOT REPORTED DUE TO COLOR INTERFERENCE OF URINE PIGMENT   Protein, ur   (*)    Value: TEST NOT REPORTED DUE TO COLOR INTERFERENCE OF URINE PIGMENT   Nitrite   (*)    Value: TEST NOT REPORTED DUE TO COLOR INTERFERENCE OF URINE PIGMENT   Leukocytes,Ua   (*)    Value: TEST NOT REPORTED DUE TO COLOR INTERFERENCE OF URINE PIGMENT   All other components within normal limits  TSH - Abnormal; Notable for the following components:   TSH 19.527 (*)    All other components within normal limits  URINALYSIS, MICROSCOPIC (REFLEX) - Abnormal; Notable for the following components:   Bacteria, UA FEW (*)    All other components within normal limits  NA AND K (SODIUM & POTASSIUM), RAND UR  OSMOLALITY, URINE  T4, FREE  T3, FREE  ABO/RH    EKG None  Radiology No results found.  Procedures Procedures    Medications Ordered in ED Medications  acetaminophen (TYLENOL) tablet 650 mg  (has no administration in time range)  ibuprofen (ADVIL) tablet 400 mg (has no administration in time range)  oxyCODONE (Oxy IR/ROXICODONE) immediate release tablet 5 mg (5 mg Oral Given 06/09/2022 2102)  sodium chloride irrigation 0.9 % 3,000 mL (3,000 mLs Irrigation New Bag/Given 05/29/2022 2149)  sodium chloride 0.9 % bolus 1,000 mL (0 mLs Intravenous Stopped 05/18/2022 2319)    ED Course/ Medical Decision Making/ A&P Clinical Course as  of 06/13/22 0000  Thu Jun 12, 2022  2014 WBC, UA: 6-10 [VB]  2015 Spoke with Dr Junious Silk who said patient does not need CBI if flushed and draining clear urine which we have flushed and now pink and clear urine.  Will leave the Foley in.  He does not require antibiotics or Flomax.  He recommends admission to [facility] long so urology can formally consult and I will admit for continued management and monitoring of anemia as well as hyponatremia. [VB]  2045 Discussed case with on-call hospitalist who has accepted patient for admission for continued trending of H&H, management of hyponatremia and inpatient urology consult at [facility] long.  Foley catheter is in for intermittent flushing for hematuria. [VB]    Clinical Course User Index [VB] Elgie Congo, MD                           Medical Decision Making  ANNIE ROSEBOOM is a 71 y.o. male.  With PMH of prostate cancer on chemotherapy with last treatment in August 2023 and history of bladder outlet obstruction, radiation cystitis not on [facility] who presents with gross hematuria passing blood clots and urinary retention.    Patient not on anticoagulation per presenting with recurrent chronic bladder outlet obstruction in the setting of known prostate cancer.  He was passing large clots and retaining approximately 400 cc of urine.  16 French coud Foley was placed with return of dark brown urine followed by pink clear urine after flushing multiple times.  He is pancytopenic in the setting of known cancer and hemoglobin  down at 7.2 today.  Not requiring transfusion but requires continued monitoring.  Thrombocytopenia 42.  Creatinine 1.24.  New hyponatremia 127 likely hypovolemic in nature.  Started patient on IV fluids.  Holding off from antibiotics with no fevers, no hypotension, no leukocytosis.  Spoke with Dr. Junious Silk who said he will consult on patient inpatient.  Will medically admit for continued trending of H&H, flushing of catheter and management of hyponatremia.  Amount and/or Complexity of Data Reviewed Labs: ordered. Decision-making details documented in ED Course. Radiology: ordered.  Risk OTC drugs. Prescription drug management. Decision regarding hospitalization.    Final Clinical Impression(s) / ED Diagnoses Final diagnoses:  Pancytopenia ([place])  Hyponatremia  Gross hematuria  Urinary retention    Rx / DC Orders ED Discharge Orders     None         Elgie Congo, MD 06/13/22 0000

## 2022-06-12 NOTE — ED Notes (Signed)
Informed MD that this facility does not perform type and screens.

## 2022-06-12 NOTE — ED Triage Notes (Signed)
Pt chemotherapy currently paused, last therapy August 2023

## 2022-06-12 NOTE — ED Triage Notes (Signed)
Pt presents to triage with c/o blood in urine this morning, now unable to void. Pt reports having bladder irrigated today at 1500, then took a nap. Has not been able to void since.   Pt endorses a history of prostate cancer

## 2022-06-13 ENCOUNTER — Inpatient Hospital Stay (HOSPITAL_COMMUNITY): Payer: Medicare PPO

## 2022-06-13 DIAGNOSIS — E871 Hypo-osmolality and hyponatremia: Secondary | ICD-10-CM | POA: Diagnosis present

## 2022-06-13 DIAGNOSIS — C61 Malignant neoplasm of prostate: Secondary | ICD-10-CM

## 2022-06-13 DIAGNOSIS — R339 Retention of urine, unspecified: Secondary | ICD-10-CM

## 2022-06-13 DIAGNOSIS — R319 Hematuria, unspecified: Secondary | ICD-10-CM | POA: Diagnosis present

## 2022-06-13 DIAGNOSIS — D61818 Other pancytopenia: Principal | ICD-10-CM | POA: Diagnosis present

## 2022-06-13 DIAGNOSIS — R310 Gross hematuria: Secondary | ICD-10-CM | POA: Diagnosis not present

## 2022-06-13 DIAGNOSIS — E876 Hypokalemia: Secondary | ICD-10-CM | POA: Diagnosis present

## 2022-06-13 LAB — CBC WITH DIFFERENTIAL/PLATELET
Abs Immature Granulocytes: 0.3 10*3/uL — ABNORMAL HIGH (ref 0.00–0.07)
Basophils Absolute: 0 10*3/uL (ref 0.0–0.1)
Basophils Relative: 1 %
Eosinophils Absolute: 0 10*3/uL (ref 0.0–0.5)
Eosinophils Relative: 0 %
HCT: 24.6 % — ABNORMAL LOW (ref 39.0–52.0)
Hemoglobin: 8.1 g/dL — ABNORMAL LOW (ref 13.0–17.0)
Immature Granulocytes: 8 %
Lymphocytes Relative: 13 %
Lymphs Abs: 0.5 10*3/uL — ABNORMAL LOW (ref 0.7–4.0)
MCH: 28.1 pg (ref 26.0–34.0)
MCHC: 32.9 g/dL (ref 30.0–36.0)
MCV: 85.4 fL (ref 80.0–100.0)
Monocytes Absolute: 0.9 10*3/uL (ref 0.1–1.0)
Monocytes Relative: 25 %
Neutro Abs: 1.9 10*3/uL (ref 1.7–7.7)
Neutrophils Relative %: 53 %
Platelets: 80 10*3/uL — ABNORMAL LOW (ref 150–400)
RBC: 2.88 MIL/uL — ABNORMAL LOW (ref 4.22–5.81)
RDW: 18 % — ABNORMAL HIGH (ref 11.5–15.5)
WBC: 3.6 10*3/uL — ABNORMAL LOW (ref 4.0–10.5)
nRBC: 0.8 % — ABNORMAL HIGH (ref 0.0–0.2)

## 2022-06-13 LAB — URINALYSIS, COMPLETE (UACMP) WITH MICROSCOPIC
Bilirubin Urine: NEGATIVE
Glucose, UA: NEGATIVE mg/dL
Ketones, ur: NEGATIVE mg/dL
Leukocytes,Ua: NEGATIVE
Nitrite: NEGATIVE
Protein, ur: NEGATIVE mg/dL
RBC / HPF: >50 RBC/hpf (ref 0–5)
Specific Gravity, Urine: <1.005 — ABNORMAL LOW (ref 1.005–1.030)
Squamous Epithelial / HPF: NONE SEEN (ref 0–5)
pH: 5.5 (ref 5.0–8.0)

## 2022-06-13 LAB — BASIC METABOLIC PANEL
Anion gap: 9 (ref 5–15)
BUN: 30 mg/dL — ABNORMAL HIGH (ref 8–23)
CO2: 18 mmol/L — ABNORMAL LOW (ref 22–32)
Calcium: 6.9 mg/dL — ABNORMAL LOW (ref 8.9–10.3)
Chloride: 98 mmol/L (ref 98–111)
Creatinine, Ser: 1.17 mg/dL (ref 0.61–1.24)
GFR, Estimated: >60 mL/min (ref >60)
Glucose, Bld: 142 mg/dL — ABNORMAL HIGH (ref 70–99)
Potassium: 4.3 mmol/L (ref 3.5–5.1)
Sodium: 125 mmol/L — ABNORMAL LOW (ref 135–145)

## 2022-06-13 LAB — T4, FREE: Free T4: 0.7 ng/dL (ref 0.61–1.12)

## 2022-06-13 LAB — PROTIME-INR
INR: 1.7 — ABNORMAL HIGH (ref 0.8–1.2)
Prothrombin Time: 19.8 seconds — ABNORMAL HIGH (ref 11.4–15.2)

## 2022-06-13 LAB — HEMOGLOBIN AND HEMATOCRIT, BLOOD
HCT: 21.2 % — ABNORMAL LOW (ref 39.0–52.0)
Hemoglobin: 7 g/dL — ABNORMAL LOW (ref 13.0–17.0)

## 2022-06-13 LAB — OSMOLALITY, URINE: Osmolality, Ur: 259 mOsm/kg — ABNORMAL LOW (ref 300–900)

## 2022-06-13 LAB — PREPARE RBC (CROSSMATCH)

## 2022-06-13 LAB — MAGNESIUM: Magnesium: 2.1 mg/dL (ref 1.7–2.4)

## 2022-06-13 MED ORDER — LIDOCAINE 5 % EX PTCH
1.0000 | MEDICATED_PATCH | CUTANEOUS | Status: DC
  Administered 2022-06-13 – 2022-06-22 (×9): 1 via TRANSDERMAL
  Filled 2022-06-13 (×9): qty 1

## 2022-06-13 MED ORDER — SODIUM CHLORIDE 0.9 % IR SOLN: 3000.0000 mL | Status: DC

## 2022-06-13 MED ORDER — SODIUM CHLORIDE 0.9% IV SOLUTION: Freq: Once | INTRAVENOUS | Status: AC

## 2022-06-13 MED ORDER — DIPHENHYDRAMINE HCL 50 MG/ML IJ SOLN
25.0000 mg | Freq: Once | INTRAMUSCULAR | Status: AC
  Administered 2022-06-13: 25 mg via INTRAVENOUS
  Filled 2022-06-13: qty 1

## 2022-06-13 MED ORDER — ACETAMINOPHEN 650 MG RE SUPP: 650.0000 mg | Freq: Four times a day (QID) | RECTAL | Status: DC | PRN

## 2022-06-13 MED ORDER — TAMSULOSIN HCL 0.4 MG PO CAPS
0.4000 mg | ORAL_CAPSULE | Freq: Every day | ORAL | Status: DC
  Administered 2022-06-13 – 2022-06-19 (×8): 0.4 mg via ORAL
  Filled 2022-06-13 (×9): qty 1

## 2022-06-13 MED ORDER — MORPHINE SULFATE (PF) 2 MG/ML IV SOLN
2.0000 mg | INTRAVENOUS | Status: DC | PRN
  Administered 2022-06-13 – 2022-06-20 (×4): 2 mg via INTRAVENOUS
  Filled 2022-06-13 (×4): qty 1

## 2022-06-13 MED ORDER — ACETAMINOPHEN 325 MG PO TABS
650.0000 mg | ORAL_TABLET | Freq: Once | ORAL | Status: AC
  Administered 2022-06-13: 650 mg via ORAL
  Filled 2022-06-13: qty 2

## 2022-06-13 MED ORDER — OXYCODONE HCL 5 MG PO TABS
5.0000 mg | ORAL_TABLET | Freq: Four times a day (QID) | ORAL | Status: DC | PRN
  Administered 2022-06-13 – 2022-06-20 (×10): 5 mg via ORAL
  Filled 2022-06-13 (×10): qty 1

## 2022-06-13 MED ORDER — HYDRALAZINE HCL 20 MG/ML IJ SOLN: 5.0000 mg | INTRAMUSCULAR | Status: DC | PRN

## 2022-06-13 MED ORDER — PREDNISONE 10 MG PO TABS
10.0000 mg | ORAL_TABLET | Freq: Every morning | ORAL | Status: DC
  Administered 2022-06-13 – 2022-06-22 (×10): 10 mg via ORAL
  Filled 2022-06-13 (×2): qty 2
  Filled 2022-06-13: qty 1
  Filled 2022-06-13 (×3): qty 2
  Filled 2022-06-13: qty 1
  Filled 2022-06-13 (×4): qty 2

## 2022-06-13 MED ORDER — DIPHENHYDRAMINE HCL 25 MG PO CAPS
25.0000 mg | ORAL_CAPSULE | Freq: Once | ORAL | Status: AC
  Administered 2022-06-13: 25 mg via ORAL
  Filled 2022-06-13: qty 1

## 2022-06-13 MED ORDER — SODIUM CHLORIDE 0.9 % IV SOLN: INTRAVENOUS | Status: DC

## 2022-06-13 MED ORDER — ACETAMINOPHEN 325 MG PO TABS
650.0000 mg | ORAL_TABLET | Freq: Four times a day (QID) | ORAL | Status: DC | PRN
  Administered 2022-06-13 – 2022-06-19 (×10): 650 mg via ORAL
  Filled 2022-06-13 (×10): qty 2

## 2022-06-13 MED ORDER — SENNOSIDES-DOCUSATE SODIUM 8.6-50 MG PO TABS
1.0000 | ORAL_TABLET | Freq: Every evening | ORAL | Status: DC | PRN
  Administered 2022-06-19 – 2022-06-21 (×3): 1 via ORAL
  Filled 2022-06-13 (×3): qty 1

## 2022-06-13 MED ORDER — IRBESARTAN 75 MG PO TABS
75.0000 mg | ORAL_TABLET | Freq: Every day | ORAL | Status: DC
  Administered 2022-06-13 – 2022-06-14 (×2): 75 mg via ORAL
  Filled 2022-06-13 (×2): qty 1

## 2022-06-13 MED ORDER — ATORVASTATIN CALCIUM 10 MG PO TABS
20.0000 mg | ORAL_TABLET | Freq: Every day | ORAL | Status: DC
  Administered 2022-06-13 – 2022-06-22 (×10): 20 mg via ORAL
  Filled 2022-06-13 (×5): qty 2
  Filled 2022-06-13: qty 1
  Filled 2022-06-13 (×3): qty 2
  Filled 2022-06-13: qty 1

## 2022-06-13 MED ORDER — TRAMADOL HCL 50 MG PO TABS: 50.0000 mg | ORAL_TABLET | Freq: Three times a day (TID) | ORAL | Status: DC | PRN

## 2022-06-13 NOTE — Consult Note (Addendum)
Consultation: Gross hematuria Caused by: Dr. Quintella Baton  History of Present Illness: Devin Mercado is a 71 year old male well-known to our service and a patient of [name]. He had prior RALP in 2016 followed by adjuvant radiation in 2016 for HR locallay advanced PCa. He had episode of gross hematuria October 2023 requiring admission with CBI and transfusion of 2 units packed red blood cells.  His evaluation including imaging and cystoscopy consistent with cystitis with multiple petechiae in the bladder but no active bleeding sites consistent with radiation-induced changes on cystoscopy with [name] early Nov 2023. He has yet yo start hyperbaric oxygen.    He was seen in the office yesterday on 05/27/2022 with difficulty voiding and gross hematuria.  Bladder scan was about 400 and a hematuria catheter was placed.  A large clot was evacuated and his urine was pink to clear.  Discussion with the patient was made and the Foley was removed.  He represented last night, 05/17/2022, to [place] ED.  He again had trouble voiding.  Bladder scan was elevated and a catheter was placed and switched out to a 24 [place] three-way hematuria catheter.  He was irrigated and started on CBI.  Also with hemoglobin dropped to 8.1, thrombocytopenia and mild hyponatremia.  Admitted to [facility].    As far as his now metastatic prostate cancer.  He is undergoing ADT and had prior Pluvicto (stopped severe anemia) with [facility] oncology. Planning cabazitaxel but on hold due to thrombocytopenia and anemia. Blood txfn at [facility] done 05/27/2022 and [facility]/04/[facility] at [facility]. His PSA has risen from 17.6 in May 2023 at the end of Pluvicto to 940 on Jun 04, 2022.   This morning no bladder complaints.  Has some back pain from "stiffness".  Urine clear on a moderate drip CBI.    Past Medical History:  Diagnosis Date   Arthritis    OA AND PAIN LEFT HIP   CHB (complete heart block) ([place])    a. MDT dual chamber pacemaker   HTN (hypertension)     Hyperlipidemia    Presence of permanent cardiac pacemaker    2000   Prostate cancer ([place]) 09/2014   Past Surgical History:  Procedure Laterality Date   ADENOIDECTOMY AS A CHILD     COLONOSCOPY WITH PROPOFOL N/A 12/30/2017   Procedure: COLONOSCOPY WITH PROPOFOL;  Surgeon: Ladene Artist, MD;  Location: [facility] ENDOSCOPY;  Service: Endoscopy;  Laterality: N/A;   JOINT REPLACEMENT     Right hip replacement Dr. Alvan Dame 01-05-18   LYMPHADENECTOMY Bilateral 11/06/2014   Procedure: PELVIC LYMPHADENECTOMY;  Surgeon: [name], MD;  Location: [facility] ORS;  Service: Urology;  Laterality: Bilateral;   PACEMAKER INSERTION  2000; 2012   MDT dual chamber pacemaker implanted 2000 with gen change 2012   POLYPECTOMY  12/30/2017   Procedure: POLYPECTOMY;  Surgeon: Ladene Artist, MD;  Location: [facility] ENDOSCOPY;  Service: Endoscopy;;   PROSTATE BIOPSY  09/05/14   REVERSE SHOULDER ARTHROPLASTY Left 10/15/2021   Procedure: REVERSE SHOULDER ARTHROPLASTY;  Surgeon: Justice Britain, MD;  Location: [facility] OR[facility];  Service: Orthopedics;  Laterality: Left;   ROBOT ASSISTED LAPAROSCOPIC RADICAL PROSTATECTOMY N/A 11/06/2014   Procedure: ROBOTIC ASSISTED LAPAROSCOPIC RADICAL PROSTATECTOMY LEVEL 2;  Surgeon: [name], MD;  Location: [facility] OR[facility];  Service: Urology;  Laterality: N/A;   TOTAL HIP ARTHROPLASTY Left 07/03/2014   Procedure: LEFT TOTAL HIP ARTHROPLASTY ANTERIOR APPROACH;  Surgeon: [name], MD;  Location: [facility] OR[facility];  Service: Orthopedics;  Laterality: Left;   TOTAL HIP ARTHROPLASTY  Right 01/05/2018   Procedure: RIGHT TOTAL HIP ARTHROPLASTY ANTERIOR APPROACH;  Surgeon: Paralee Cancel, MD;  Location: [facility] OR[facility];  Service: Orthopedics;  Laterality: Right;  70 mins   WISDOM TEETH EXTRACTIONS      Home Medications:  Medications Prior to Admission  Medication Sig Dispense Refill Last Dose   atorvastatin (LIPITOR) 20 MG tablet Take 1 tablet by mouth once daily (Patient taking differently: Take 20 mg by mouth daily.) 90 tablet 3 05/27/2022    denosumab (XGEVA) 120 MG/1.7ML SOLN injection Inject 120 mg into the skin every 2 (two) months.   Past Month   ferrous sulfate 325 (65 FE) MG tablet Take 325 mg by mouth daily with breakfast.   05/18/2022   predniSONE (DELTASONE) 10 MG tablet Take 1 tablet (10 mg total) by mouth every morning. 30 tablet 11 05/17/2022   valsartan (DIOVAN) 160 MG tablet Take 1 tablet (160 mg total) by mouth daily. 90 tablet 3 06/10/2022   acetaminophen (TYLENOL) 325 MG tablet Take 650 mg by mouth every 6 (six) hours as needed for mild pain or headache.   More than a month   amoxicillin (AMOXIL) 500 MG capsule Take 4 capsules by mouth 1 hour prior to dental appointment for pre-medication. (Patient taking differently: Take 2,000 mg by mouth See admin instructions. Take 2,000 mg by mouth one hour prior to dental appointments for pre-medication) 12 capsule 0 More than a month   calcium carbonate (OS-CAL - DOSED IN MG OF ELEMENTAL CALCIUM) 1250 (500 Ca) MG tablet Take 1 tablet by mouth 2 (two) times a week.   More than a month   HYDROcodone-acetaminophen (NORCO/VICODIN) 5-325 MG tablet Take 1 tablet by mouth at bedtime as needed. 6 tablet 0 More than a month   leuprolide, 6 Month, (ELIGARD) 45 MG injection Inject 45 mg into the skin every 6 (six) months.      prochlorperazine (COMPAZINE) 10 MG tablet Take 1 tablet (10 mg total) by mouth every 6 (six) hours as needed as needed for nausea or vomiting. 30 tablet 8 More than a month   traMADol (ULTRAM) 50 MG tablet Take 1 tablet (50 mg total) by mouth every 8 (eight) hours as needed for Pain for up to 20 days 60 tablet 0 More than a month   Allergies: No Known Allergies  Family History  Problem Relation Age of Onset   Stroke Mother        multiple. 27.    Heart disease Mother        hx heart valve problem   Prostate cancer Father        80 of prostate cancer   Heart attack Neg Hx    Hypertension Neg Hx    Social History:  reports that he has never smoked. He has  never used smokeless tobacco. He reports current alcohol use of about 2.0 - 3.0 standard drinks of alcohol per week. He reports that he does not use drugs.  ROS: A complete review of systems was performed.  All systems are negative except for pertinent findings as noted. Review of Systems  Constitutional:  Positive for malaise/fatigue.  All other systems reviewed and are negative.    Physical Exam:  Vital signs in last 24 hours: Temp:  [97.2 F (36.2 C)-100.7 F (38.2 C)] 98.5 F (36.9 C) (12/01 0730) Pulse Rate:  [84-97] 89 (12/01 0730) Resp:  [16-30] 30 (12/01 0730) BP: (133-159)/(72-81) 135/75 (12/01 0730) SpO2:  [95 %-100 %] 99 % (12/01 0730) Weight:  [  102.1 kg-103 kg] 103 kg (11/30 2359) General:  Alert and oriented, No acute distress HEENT: Normocephalic, atraumatic Cardiovascular: Regular rate and rhythm Lungs: Regular rate and effort Abdomen: Soft, nontender, nondistended, no abdominal masses Back: No CVA tenderness Extremities: No edema Neurologic: Grossly intact GU: 24 French three-way hematuria cath in place.  Urine clear in the tubing on a moderate drip CBI.  Faint pink in the bag.  Laboratory Data:  Results for orders placed or performed during the hospital encounter of 05/19/2022 (from the past 24 hour(s))  Lipase, blood     Status: Abnormal   Collection Time: 05/14/2022  5:20 PM  Result Value Ref Range   Lipase 10 (L) 11 - 51 U/L  Comprehensive metabolic panel     Status: Abnormal   Collection Time: 06/09/2022  5:20 PM  Result Value Ref Range   Sodium 127 (L) 135 - 145 mmol/L   Potassium 4.4 3.5 - 5.1 mmol/L   Chloride 96 (L) 98 - 111 mmol/L   CO2 20 (L) 22 - 32 mmol/L   Glucose, Bld 94 70 - 99 mg/dL   BUN 36 (H) 8 - 23 mg/dL   Creatinine, Ser 1.24 0.61 - 1.24 mg/dL   Calcium 7.7 (L) 8.9 - 10.3 mg/dL   Total Protein 6.4 (L) 6.5 - 8.1 g/dL   Albumin 3.6 3.5 - 5.0 g/dL   AST 18 15 - 41 U/L   ALT 11 0 - 44 U/L   Alkaline Phosphatase 70 38 - 126 U/L   Total  Bilirubin 0.6 0.3 - 1.2 mg/dL   GFR, Estimated >60 >60 mL/min   Anion gap 11 5 - 15  CBC     Status: Abnormal   Collection Time: 05/25/2022  5:20 PM  Result Value Ref Range   WBC 3.3 (L) 4.0 - 10.5 K/uL   RBC 2.60 (L) 4.22 - 5.81 MIL/uL   Hemoglobin 7.2 (L) 13.0 - 17.0 g/dL   HCT 21.5 (L) 39.0 - 52.0 %   MCV 82.7 80.0 - 100.0 fL   MCH 27.7 26.0 - 34.0 pg   MCHC 33.5 30.0 - 36.0 g/dL   RDW 18.2 (H) 11.5 - 15.5 %   Platelets 42 (L) 150 - 400 K/uL   nRBC 0.6 (H) 0.0 - 0.2 %  Urinalysis, Routine w reflex microscopic Urine, Catheterized     Status: Abnormal   Collection Time: 05/20/2022  7:21 PM  Result Value Ref Range   Color, Urine RED (A) YELLOW   APPearance TURBID (A) CLEAR   Specific Gravity, Urine  1.005 - 1.030    TEST NOT REPORTED DUE TO COLOR INTERFERENCE OF URINE PIGMENT   pH  5.0 - 8.0    TEST NOT REPORTED DUE TO COLOR INTERFERENCE OF URINE PIGMENT   Glucose, UA (A) NEGATIVE mg/dL    TEST NOT REPORTED DUE TO COLOR INTERFERENCE OF URINE PIGMENT   Hgb urine dipstick (A) NEGATIVE    TEST NOT REPORTED DUE TO COLOR INTERFERENCE OF URINE PIGMENT   Bilirubin Urine (A) NEGATIVE    TEST NOT REPORTED DUE TO COLOR INTERFERENCE OF URINE PIGMENT   Ketones, ur (A) NEGATIVE mg/dL    TEST NOT REPORTED DUE TO COLOR INTERFERENCE OF URINE PIGMENT   Protein, ur (A) NEGATIVE mg/dL    TEST NOT REPORTED DUE TO COLOR INTERFERENCE OF URINE PIGMENT   Nitrite (A) NEGATIVE    TEST NOT REPORTED DUE TO COLOR INTERFERENCE OF URINE PIGMENT   Leukocytes,Ua (A) NEGATIVE    TEST  NOT REPORTED DUE TO COLOR INTERFERENCE OF URINE PIGMENT  Na and K (sodium & potassium), rand urine     Status: None   Collection Time: 06/11/2022  7:21 PM  Result Value Ref Range   Sodium, Ur 35 mmol/L   Potassium Urine 19 mmol/L  Osmolality, urine     Status: Abnormal   Collection Time: 05/14/2022  7:21 PM  Result Value Ref Range   Osmolality, Ur 259 (L) 300 - 900 mOsm/kg  Urinalysis, Microscopic (reflex)     Status: Abnormal    Collection Time: 05/25/2022  7:21 PM  Result Value Ref Range   RBC / HPF >50 0 - 5 RBC/hpf   WBC, UA 6-10 0 - 5 WBC/hpf   Bacteria, UA FEW (A) NONE SEEN   Squamous Epithelial / LPF 0-5 0 - 5   Mucus PRESENT   ABO/Rh     Status: None   Collection Time: 05/28/2022  7:32 PM  Result Value Ref Range   ABO/RH(D)      A POS Performed at [facility], [place] [address], [place], [place] [address]   TSH     Status: Abnormal   Collection Time: 05/28/2022  7:32 PM  Result Value Ref Range   TSH 19.527 (H) 0.350 - 4.500 uIU/mL  Hemoglobin and hematocrit, blood     Status: Abnormal   Collection Time: 06/13/22  2:35 AM  Result Value Ref Range   Hemoglobin 7.0 (L) 13.0 - 17.0 g/dL   HCT 21.2 (L) 39.0 - 52.0 %  Type and screen [place]     Status: None (Preliminary result)   Collection Time: 06/13/22  2:35 AM  Result Value Ref Range   ABO/RH(D) A POS    Antibody Screen NEG    Sample Expiration 06/16/2022,2359    Unit Number [id number]    Blood Component Type RED CELLS,LR    Unit division 00    Status of Unit ISSUED    Transfusion Status OK TO TRANSFUSE    Crossmatch Result      Compatible Performed at [facility], [place] [address], [place], [place] [address]   Prepare RBC (crossmatch)     Status: None   Collection Time: 06/13/22  2:35 AM  Result Value Ref Range   Order Confirmation      ORDER PROCESSED BY BLOOD BANK Performed at [facility], [place] [address], [place], [place] [address]   Prepare platelet pheresis     Status: None (Preliminary result)   Collection Time: 06/13/22  2:35 AM  Result Value Ref Range   Unit Number [id number]    Blood Component Type PLTP1 PSORALEN TREATED    Unit division 00    Status of Unit ALLOCATED    Transfusion Status OK TO TRANSFUSE    Unit Number [id number]    Blood Component Type PLTP1 PSORALEN TREATED    Unit division 00    Status of Unit ALLOCATED    Transfusion  Status      OK TO TRANSFUSE Performed at [facility] [address], [place], [place] [address]    No results found for this or any previous visit (from the past 240 hour(s)). Creatinine: Recent Labs    06/09/2022 1720  CREATININE 1.24    Impression/Assessment/plan:  1) Hemorrhagic cystitis with gross hematuria and clot retention-this may have been exacerbated by thrombocytopenia.  He is getting platelets and blood today.  Consider void trial in a.m. if urine  remains clear.  I slowed the CBI rate.   2) mCRPCa - under care of [facility] oncology but unable to start Jevtana due to bone marrow suppression. Rapidly rising PSA.   I will notify [name] of admission and communicate with on call urologist. Appreciate excellent TRH care.   Festus Aloe 06/13/2022, 7:34 AM

## 2022-06-13 NOTE — H&P (Signed)
PCP:   [name], MD   Chief Complaint: Hematuria  HPI: This is a 71 year old male with history of prostate cancer with bone mets, treated at [facility].  He also has anemia associated with bone marrow infiltration for which he receives outpatient transfusions.  He also has pancytopenia from bone marrow suppression from Pluvicto.  He presents with hematuria.  Yesterday he woke with blood in his pants.  He went to his oncologist office, Foley was placed, he was irrigated until clear.  He has Foley was removed, he urinated in the office, and he was discharged home.  During the night he woke up with pain, and realized he was again blocked up.  He went to [facility], three-way Foley placed, bladder irrigation started.  He was transferred to [facility], urology aware.  History provided by patient.  Review of Systems:  The patient denies anorexia, fever, weight loss,, vision loss, decreased hearing, hoarseness, chest pain, syncope, dyspnea on exertion, peripheral edema, balance deficits, hemoptysis, abdominal pain, melena, hematochezia, severe indigestion/heartburn, hematuria, incontinence, genital sores, muscle weakness, suspicious skin lesions, transient blindness, difficulty walking, depression, unusual weight change, abnormal bleeding, enlarged lymph nodes, angioedema, and breast masses. Positive: Hematuria, back pain, abdominal pain  Past Medical History: Past Medical History:  Diagnosis Date   Arthritis    OA AND PAIN LEFT HIP   CHB (complete heart block) ([place])    a. MDT dual chamber pacemaker   HTN (hypertension)    Hyperlipidemia    Presence of permanent cardiac pacemaker    2000   Prostate cancer ([place]) 09/2014   Past Surgical History:  Procedure Laterality Date   ADENOIDECTOMY AS A CHILD     COLONOSCOPY WITH PROPOFOL N/A 12/30/2017   Procedure: COLONOSCOPY WITH PROPOFOL;  Surgeon: Ladene Artist, MD;  Location: [facility];  Service: Endoscopy;  Laterality: N/A;    JOINT REPLACEMENT     Right hip replacement Dr. Alvan Dame 01-05-18   LYMPHADENECTOMY Bilateral 11/06/2014   Procedure: PELVIC LYMPHADENECTOMY;  Surgeon: [name], MD;  Location: [facility];  Service: Urology;  Laterality: Bilateral;   PACEMAKER INSERTION  2000; 2012   MDT dual chamber pacemaker implanted 2000 with gen change 2012   POLYPECTOMY  12/30/2017   Procedure: POLYPECTOMY;  Surgeon: Ladene Artist, MD;  Location: [facility];  Service: Endoscopy;;   PROSTATE BIOPSY  09/05/14   REVERSE SHOULDER ARTHROPLASTY Left 10/15/2021   Procedure: REVERSE SHOULDER ARTHROPLASTY;  Surgeon: Justice Britain, MD;  Location: [facility];  Service: Orthopedics;  Laterality: Left;   ROBOT ASSISTED LAPAROSCOPIC RADICAL PROSTATECTOMY N/A 11/06/2014   Procedure: ROBOTIC ASSISTED LAPAROSCOPIC RADICAL PROSTATECTOMY LEVEL 2;  Surgeon: [name], MD;  Location: [facility];  Service: Urology;  Laterality: N/A;   TOTAL HIP ARTHROPLASTY Left 07/03/2014   Procedure: LEFT TOTAL HIP ARTHROPLASTY ANTERIOR APPROACH;  Surgeon: [name], MD;  Location: [facility];  Service: Orthopedics;  Laterality: Left;   TOTAL HIP ARTHROPLASTY Right 01/05/2018   Procedure: RIGHT TOTAL HIP ARTHROPLASTY ANTERIOR APPROACH;  Surgeon: Paralee Cancel, MD;  Location: [facility];  Service: Orthopedics;  Laterality: Right;  70 mins   WISDOM TEETH EXTRACTIONS      Medications: Prior to Admission medications   Medication Sig Start Date End Date Taking? Authorizing Provider  atorvastatin (LIPITOR) 20 MG tablet Take 1 tablet by mouth once daily Patient taking differently: Take 20 mg by mouth daily. 07/29/21  Yes [name], MD  denosumab (XGEVA) 120 MG/1.7ML SOLN injection Inject 120 mg into  the skin every 2 (two) months.   Yes [provider]  ferrous sulfate 325 (65 FE) MG tablet Take 325 mg by mouth daily with breakfast.   Yes [provider]  predniSONE (DELTASONE) 10 MG tablet Take 1 tablet (10 mg total) by mouth every morning. 05/01/22   Yes   valsartan (DIOVAN) 160 MG tablet Take 1 tablet (160 mg total) by mouth daily. 10/10/21  Yes [name], MD  acetaminophen (TYLENOL) 325 MG tablet Take 650 mg by mouth every 6 (six) hours as needed for mild pain or headache.    [provider]  amoxicillin (AMOXIL) 500 MG capsule Take 4 capsules by mouth 1 hour prior to dental appointment for pre-medication. Patient taking differently: Take 2,000 mg by mouth See admin instructions. Take 2,000 mg by mouth one hour prior to dental appointments for pre-medication 02/17/22     calcium carbonate (OS-CAL - DOSED IN MG OF ELEMENTAL CALCIUM) 1250 (500 Ca) MG tablet Take 1 tablet by mouth 2 (two) times a week.    [provider]  HYDROcodone-acetaminophen (NORCO/VICODIN) 5-325 MG tablet Take 1 tablet by mouth at bedtime as needed. 06/04/2022     leuprolide, 6 Month, (ELIGARD) 45 MG injection Inject 45 mg into the skin every 6 (six) months.    [provider]  prochlorperazine (COMPAZINE) 10 MG tablet Take 1 tablet (10 mg total) by mouth every 6 (six) hours as needed as needed for nausea or vomiting. 05/01/22     traMADol (ULTRAM) 50 MG tablet Take 1 tablet (50 mg total) by mouth every 8 (eight) hours as needed for Pain for up to 20 days 05/27/22       Allergies:  No Known Allergies  Social History:  reports that he has never smoked. He has never used smokeless tobacco. He reports current alcohol use of about 2.0 - 3.0 standard drinks of alcohol per week. He reports that he does not use drugs.  Family History: Family History  Problem Relation Age of Onset   Stroke Mother        multiple. 67.    Heart disease Mother        hx heart valve problem   Prostate cancer Father        3 of prostate cancer   Heart attack Neg Hx    Hypertension Neg Hx     Physical Exam: Vitals:   06/01/2022 2000 05/23/2022 2145 06/08/2022 2230 05/21/2022 2359  BP: 139/74 (!) 157/80 (!) 143/72 139/72  Pulse: 84 91 92 97  Resp: 18 18 (!) 29 16   Temp:    100 F (37.8 C)  TempSrc:    Oral  SpO2: 100% 100% 97% 99%  Weight:    103 kg  Height:        General:  Alert and oriented times three, well developed and nourished, no acute distress.  Somewhat uncomfortable Eyes: PERRLA, pink conjunctiva, no scleral icterus ENT: Moist oral mucosa, neck supple, no thyromegaly Lungs: clear to ascultation, no wheeze, no crackles, no use of accessory muscles Cardiovascular: regular rate and rhythm, no regurgitation, no gallops, no murmurs. No carotid bruits, no JVD Abdomen: soft, positive BS, non-tender, non-distended, no organomegaly, not an acute abdomen GU: Foley in place, CBI underway, light pink urine output Neuro: CN II - XII grossly intact, sensation intact Musculoskeletal: strength 5/5 all extremities, no clubbing, cyanosis or edema Skin: no rash, no subcutaneous crepitation, no decubitus Psych: appropriate patient   Labs on Admission:  Recent Labs    05/24/2022 1720  NA 127*  K 4.4  CL 96*  CO2 20*  GLUCOSE 94  BUN 36*  CREATININE 1.24  CALCIUM 7.7*   Recent Labs    06/02/2022 1720  AST 18  ALT 11  ALKPHOS 70  BILITOT 0.6  PROT 6.4*  ALBUMIN 3.6   Recent Labs    06/02/2022 1720  LIPASE 10*   Recent Labs    06/07/2022 1720  WBC 3.3*  HGB 7.2*  HCT 21.5*  MCV 82.7  PLT 42*    Recent Labs    06/10/2022 1932  TSH 19.527*      Radiological Exams on Admission:   Assessment/Plan Present on Admission:  Acute urinary retention/ Hematuria -Admit to med surge -Likely due to thrombocytopenia.  2 units platelets ordered ordered -Urology consult by a.m. team -CBI ordered -Flomax for bladder spasms -No current evidence of infection  Anemia secondary to acute hemorrhage -Serial H&H ordered -Type and screen, single unit PRBC ordered -Posttransfusion CBC   Hyponatremia/ Hypocalcemia -TSH, urine sodium, urine and plasma osmolality ordered -Await results of urine IV fluids or fluid restrict as indicated    Pancytopenia (HCC)/ Prostate cancer ([place])  Malignant neoplasm metastatic to bone ([place]) -Pancytopenia secondary to  Pluvicto. This has been stopped -Per oncology -Prednisone 5 mg p.o. twice daily for bone pain continued   Essential hypertension -Stable, valsartan resumed   Hyperlipidemia -Stable, atorvastatin resumed  Elevated TSH -Will order T3 and free T4   Atrioventricular block, complete ([place]) -PPM-Medtronic    Elmo Shumard 06/13/2022, 1:11 AM

## 2022-06-13 NOTE — Progress Notes (Signed)
  PROGRESS NOTE  Patient admitted earlier this morning. See H&P.   Devin Mercado is a 71 year old male with past medical history significant for prostate cancer with bone mets, followed by oncology at [facility], anemia associated with bone marrow infiltration, pancytopenia from bone marrow suppression from Pluvicto.  Who presents to the hospital with hematuria.  He presented to his oncologist office, Foley catheter was placed and irrigated and was removed and sent home.  During the night, he woke up with pain and presented to the emergency department.  At that time, the Foley catheter was placed and bladder irrigation was started.  Patient admitted and urology consulted.  Patient complains of some discomfort in his back he was repositioned, lidocaine patch was ordered.  Acute urinary retention with hematuria, hemorrhagic cystitis -Urology following -Continuous bladder irrigation  -Flomax  Acute blood loss anemia, pancytopenia -Transfused 2 units platelets -Transfused 1 unit packed red blood cell -Trend CBC  Fever -Check blood cultures  Metastatic prostate cancer to bone, pancytopenia -Followed by oncology at [facility] -Prednisone  Hypertension -Valsartan  Hyperlipidemia -Atorvastatin  Euthyroid sick syndrome -Elevated TSH but normal T4 -Repeat TSH as outpatient in 4 to 6 weeks   Status is: Inpatient Remains inpatient appropriate because: CBI   Dessa Phi, DO [facility] 06/13/2022, 11:36 AM  Available via Epic secure chat 7am-7pm After these hours, please refer to coverage provider listed on [email or web address]

## 2022-06-13 NOTE — Patient Outreach (Signed)
To whom it may concern:  This note is to document that [name] was hospitalized on 06/02/2022 due to acute medical issues.  This required him and his wife, [name], to have to cancel their travel plans acutely.  He remains hospitalized as of 06/14/22 and potentially ongoing.    Pryor Curia MD

## 2022-06-13 NOTE — Progress Notes (Signed)
MEWS now green.  Temp down to 100.2.  Pt remains alert and oriented.  CBI continues with urine cherry colored on slow drip rate and is flowing well.  Update called to [place] with Rapid Response.  She will come check on pt shortly.    06/13/22 2120  Vitals  Temp 100.2 F (37.9 C)  Temp Source Oral  BP (!) 127/46  MAP (mmHg) 70  BP Location Left Arm  BP Method Automatic  Patient Position (if appropriate) Lying  Pulse Rate 86  Pulse Rate Source Dinamap  Resp (!) 24  Level of Consciousness  Level of Consciousness Alert  MEWS COLOR  MEWS Score Color Green  Oxygen Therapy  SpO2 97 %  O2 Device Room Air  MEWS Score  MEWS Temp 0  MEWS Systolic 0  MEWS Pulse 0  MEWS RR 1  MEWS LOC 0  MEWS Score 1   Ayesha Mohair BSN RN [facility] 06/13/2022, 9:33 PM

## 2022-06-13 NOTE — Plan of Care (Signed)
  Problem: Education: Goal: Knowledge of General Education information will improve Description Including pain rating scale, medication(s)/side effects and non-pharmacologic comfort measures Outcome: Progressing   

## 2022-06-13 NOTE — TOC Initial Note (Signed)
Transition of Care [facility]) - Initial/Assessment Note    Patient Details  Name: Devin Mercado MRN: [id number] Date of Birth: [birth year]  Transition of Care [facility] CM/SW Contact:    Angelita Ingles, RN Phone Number:[phone]  06/13/2022, 11:28 AM  Clinical Narrative:                  Transition of Care (TOC) Screening Note   Patient Details  Name: Devin Mercado Date of Birth: [birth year]   Transition of Care [facility]) CM/SW Contact:    Angelita Ingles, RN Phone Number: 06/13/2022, 11:28 AM    Transition of Care Department (TOC) has reviewed patient and no TOC needs have been identified at this time. We will continue to monitor patient advancement through interdisciplinary progression rounds. If new patient transition needs arise, please place a TOC consult.          Patient Goals and CMS Choice        Expected Discharge Plan and Services                                                Prior Living Arrangements/Services                       Activities of Daily Living Home Assistive Devices/Equipment: None ADL Screening (condition at time of admission) Patient's cognitive ability adequate to safely complete daily activities?: No Is the patient deaf or have difficulty hearing?: No Does the patient have difficulty seeing, even when wearing glasses/contacts?: Yes Does the patient have difficulty concentrating, remembering, or making decisions?: No Patient able to express need for assistance with ADLs?: Yes Does the patient have difficulty dressing or bathing?: No Independently performs ADLs?: Yes (appropriate for developmental age) Does the patient have difficulty walking or climbing stairs?: No Weakness of Legs: None Weakness of Arms/Hands: None  Permission Sought/Granted                  Emotional Assessment              Admission diagnosis:  Urinary retention [R33.9] Hyponatremia [E87.1] Gross hematuria [R31.0] Acute urinary  retention [R33.8] Pancytopenia ([place]) [D61.818] Patient Active Problem List   Diagnosis Date Noted   Hematuria 06/13/2022   Hyponatremia 06/13/2022   Hypokalemia 06/13/2022   Pancytopenia ([place]) 06/13/2022   Acute urinary retention 05/19/2022   Erectile dysfunction 05/29/2022   PSA elevation 05/29/2022   Symptomatic anemia 05/05/2022   Bladder outlet obstruction 05/01/2022   S/P reverse total shoulder arthroplasty, left 10/15/2021   COVID-19 07/11/2021   Gross hematuria 12/14/2020   Malignant neoplasm metastatic to bone ([place]) 05/07/2020   History of adenomatous polyp of colon 04/15/2019   Palpitations 03/15/2019   Encounter for monitoring androgen deprivation therapy 04/01/2018   S/P right THA, AA 01/05/2018   Positive colorectal cancer screening using Cologuard test    Hyperglycemia 06/12/2015   Prostate cancer ([place]) 09/12/2014   Obese 07/04/2014   S/P left THA, AA 07/03/2014   PPM-Medtronic 05/23/2010   Hyperlipidemia 03/28/2009   Atrioventricular block, complete ([place]) 03/28/2009   Essential hypertension 03/16/2008   PCP:  [name], MD Pharmacy:   [place] [address] Phone: [phone] Fax: [phone]     Social Determinants of Health (SDOH) Interventions    Readmission  Risk Interventions    05/08/2022    9:44 AM  Readmission Risk Prevention Plan  Post Dischage Appt Complete  Medication Screening Complete  Transportation Screening Complete

## 2022-06-13 NOTE — Progress Notes (Signed)
Pt has been yellow/red MEWS today due to increased temp, HR and RR.  Pt currently alert and oriented and denies any pain or discomfort.  Discussed with Janelle RN with RR.  PRN Tylenol given, room temperature decreased (although it wasn't hot in the room), and ice packs applied to pt's under arm areas.  Will reassess in 1 hour and f/u with RR then per Janelle's request.     06/13/22 1900 06/13/22 1950  Assess: MEWS Score  Temp (!) 103.2 F (39.6 C)  --   BP (!) 160/69  --   MAP (mmHg) 93  --   ECG Heart Rate (!) 108  --   Resp (!) 27  --   SpO2 96 %  --   O2 Device Room Air  --   Assess: MEWS Score  MEWS Temp 2  --   MEWS Systolic 0  --   MEWS Pulse 1  --   MEWS RR 2  --   MEWS LOC 0  --   MEWS Score 5  --   MEWS Score Color Red  --   Assess: if the MEWS score is Yellow or Red  Were vital signs taken at a resting state?  --  Yes  Focused Assessment  --  No change from prior assessment  Does the patient meet 2 or more of the SIRS criteria?  --  Yes  Does the patient have a confirmed or suspected source of infection?  --  No  MEWS guidelines implemented *See Row Information*  --  Yes  Treat  MEWS Interventions  --  Administered prn meds/treatments  Pain Scale 0-10  --   Pain Score 0  --   Take Vital Signs  Increase Vital Sign Frequency   --  Red: Q 1hr X 4 then Q 4hr X 4, if remains red, continue Q 4hrs  Escalate  MEWS: Escalate  --  Red: discuss with charge nurse/RN and provider, consider discussing with RRT  Notify: Charge Nurse/RN  Name of Charge Nurse/RN Notified  --  TEFL teacher  Date Charge Nurse/RN Notified  --  06/13/22  Time Charge Nurse/RN Notified  --  1945  Notify: Rapid Response  Name of Rapid Response RN Notified  --  Janelle RN  Date Rapid Response Notified  --  06/13/22  Time Rapid Response Notified  --  1950  Assess: SIRS CRITERIA  SIRS Temperature  1  --   SIRS Pulse 1  --   SIRS Respirations  1  --   SIRS WBC 1  --   SIRS Score Sum  4  --     Ayesha Mohair BSN RN [facility] 06/13/2022, 7:57 PM

## 2022-06-13 NOTE — Progress Notes (Signed)
Patient ID: Devin Mercado, male   DOB: August 03, 1950, 71 y.o.   MRN: [id number]  Doing better today.  Urine is mostly clear on minimal CBI drip.  I titrated drip some more and nursing will continue to titrate to off overnight with plans for possible voiding trial in the AM.  He is scheduled to start hyperbaric oxygen therapy in one week.

## 2022-06-13 NOTE — Progress Notes (Signed)
Rapid Response Event Note   Reason for Call : F/U r/t to increased temperate.  Initial Focused Assessment: Pt a/o and f/c.  Denies any new pain other than "back pain from lying in bed" per pt.  Lungs clear and decreased.  Pt c/o being cold.  Temperature is now 99.7 oral.   Interventions:  After reviewing pt chart, spoke with [facility], NP in which she will review pt chart and check in on pt as well.  Pt has blood cultures pending.    Plan of Care:  Pt doesn't seems to be in distress, once again [facility], NP will evaluate.  Spoke with pt RN to continue to monitor pt labs and report as needed.   Event Summary:    MD Notified:  Yes Call Time: 2131 Arrival Time: 2132 End Time: 2230  Dyann Ruddle, RN

## 2022-06-13 NOTE — Progress Notes (Signed)
   06/13/22 1342  Vitals  Vital Signs Type (Include Temp, Pulse, RR, and B/P) Blood transfusion completion (within 30 minutes)  Temp (!) 102.4 F (39.1 C)  Temp Source Axillary  Pulse Rate (!) 106  ECG Heart Rate 97  Resp (!) 31  BP (!) 155/70  Oxygen Therapy  SpO2 98 %  O2 Device Room Air  Suspected Transfusion Reaction  Reaction Symptoms Fever (temp. increase of 2 degrees F)  Reaction Interventions Medication;IV tubing and remaining blood sent to lab;Reaction protocol completed;Transfusion stopped, Saline hung with new IV tubing  Physician Notified? Yes  Blood Bank Notified? Yes  Time Reaction Noted 1342  Time Infusion Interrupted 1342  Volume Received Prior to Suspected Reaction 453 mL  Unit Number [id number]

## 2022-06-13 DEATH — deceased

## 2022-06-14 ENCOUNTER — Inpatient Hospital Stay (HOSPITAL_COMMUNITY): Payer: Medicare PPO

## 2022-06-14 DIAGNOSIS — R270 Ataxia, unspecified: Secondary | ICD-10-CM | POA: Diagnosis not present

## 2022-06-14 DIAGNOSIS — I63539 Cerebral infarction due to unspecified occlusion or stenosis of unspecified posterior cerebral artery: Secondary | ICD-10-CM | POA: Diagnosis not present

## 2022-06-14 DIAGNOSIS — R310 Gross hematuria: Secondary | ICD-10-CM | POA: Diagnosis not present

## 2022-06-14 LAB — PREPARE PLATELET PHERESIS
Unit division: 0
Unit division: 0
Unit division: 0

## 2022-06-14 LAB — HEMOGLOBIN AND HEMATOCRIT, BLOOD
HCT: 22.2 % — ABNORMAL LOW (ref 39.0–52.0)
Hemoglobin: 7.2 g/dL — ABNORMAL LOW (ref 13.0–17.0)

## 2022-06-14 LAB — BPAM PLATELET PHERESIS
Blood Product Expiration Date: 202312032359
Blood Product Expiration Date: 202312042359
Blood Product Expiration Date: 202312042359
ISSUE DATE / TIME: 202312010806
ISSUE DATE / TIME: 202312010902
ISSUE DATE / TIME: 202312011031
Unit Type and Rh: 5100
Unit Type and Rh: 5100
Unit Type and Rh: 6200

## 2022-06-14 LAB — CBC WITH DIFFERENTIAL/PLATELET
Abs Immature Granulocytes: 0 10*3/uL (ref 0.00–0.07)
Band Neutrophils: 3 %
Basophils Absolute: 0 10*3/uL (ref 0.0–0.1)
Basophils Relative: 0 %
Eosinophils Absolute: 0 10*3/uL (ref 0.0–0.5)
Eosinophils Relative: 0 %
HCT: 22.5 % — ABNORMAL LOW (ref 39.0–52.0)
Hemoglobin: 7.3 g/dL — ABNORMAL LOW (ref 13.0–17.0)
Lymphocytes Relative: 16 %
Lymphs Abs: 0.4 10*3/uL — ABNORMAL LOW (ref 0.7–4.0)
MCH: 28 pg (ref 26.0–34.0)
MCHC: 32.4 g/dL (ref 30.0–36.0)
MCV: 86.2 fL (ref 80.0–100.0)
Monocytes Absolute: 0.6 10*3/uL (ref 0.1–1.0)
Monocytes Relative: 25 %
Neutro Abs: 1.4 10*3/uL — ABNORMAL LOW (ref 1.7–7.7)
Neutrophils Relative %: 56 %
Platelets: 43 10*3/uL — ABNORMAL LOW (ref 150–400)
RBC: 2.61 MIL/uL — ABNORMAL LOW (ref 4.22–5.81)
RDW: 17.9 % — ABNORMAL HIGH (ref 11.5–15.5)
WBC: 2.4 10*3/uL — ABNORMAL LOW (ref 4.0–10.5)
nRBC: 0.8 % — ABNORMAL HIGH (ref 0.0–0.2)

## 2022-06-14 LAB — BASIC METABOLIC PANEL
Anion gap: 9 (ref 5–15)
BUN: 28 mg/dL — ABNORMAL HIGH (ref 8–23)
CO2: 20 mmol/L — ABNORMAL LOW (ref 22–32)
Calcium: 6.9 mg/dL — ABNORMAL LOW (ref 8.9–10.3)
Chloride: 103 mmol/L (ref 98–111)
Creatinine, Ser: 1.12 mg/dL (ref 0.61–1.24)
GFR, Estimated: >60 mL/min (ref >60)
Glucose, Bld: 110 mg/dL — ABNORMAL HIGH (ref 70–99)
Potassium: 4 mmol/L (ref 3.5–5.1)
Sodium: 132 mmol/L — ABNORMAL LOW (ref 135–145)

## 2022-06-14 LAB — PREPARE RBC (CROSSMATCH)

## 2022-06-14 LAB — GLUCOSE, CAPILLARY
Glucose-Capillary: 121 mg/dL — ABNORMAL HIGH (ref 70–99)
Glucose-Capillary: 172 mg/dL — ABNORMAL HIGH (ref 70–99)

## 2022-06-14 LAB — LACTIC ACID, PLASMA: Lactic Acid, Venous: 1 mmol/L (ref 0.5–1.9)

## 2022-06-14 MED ORDER — IOHEXOL 350 MG/ML SOLN
100.0000 mL | Freq: Once | INTRAVENOUS | Status: AC | PRN
  Administered 2022-06-14: 100 mL via INTRAVENOUS

## 2022-06-14 MED ORDER — SODIUM CHLORIDE 0.9% IV SOLUTION: Freq: Once | INTRAVENOUS | Status: AC

## 2022-06-14 MED ORDER — CIPROFLOXACIN HCL 500 MG PO TABS
500.0000 mg | ORAL_TABLET | Freq: Two times a day (BID) | ORAL | Status: DC
  Administered 2022-06-14 (×2): 500 mg via ORAL
  Filled 2022-06-14 (×3): qty 1

## 2022-06-14 MED ORDER — SODIUM CHLORIDE 0.9 % IV SOLN: INTRAVENOUS | Status: DC

## 2022-06-14 NOTE — Consult Note (Signed)
Neurology Consultation Reason for Consult: LUE Ataxia Requesting Physician: Dessa Phi  CC: LUE ataxia, presented for hematuria w/ sepsis   History is obtained from: wife at bedside, nurse, primary team and chart review   HPI: Devin Mercado is a 71 y.o. male with a past medical history significant for hypertension, hyperlipidemia, complete heart block s/p non-MRI compatible cardiac pacemaker, arthritis, prostate cancer.  He presented with symptomatic hematuria.  Nurse at bedside reports that 9 AM he was able to use his right hand properly to use a cup to administer himself medications.  Subsequently he was lethargic at around 10 AM.  On wife's arrival at approximately 2:30 PM she noted that he had significant right upper extremity ataxia, for which neurology was consulted and given head CT findings with hypodensity in the posterior circulation distribution code stroke was activated to rule out LVO  LKW: 9 AM 12/2 Thrombolytic given?: No, thrombocytopenic, out of the window, hypodensity on Head CT  IA performed?: No, no LVO Premorbid modified rankin scale:      0 - No symptoms.  ROS: Limited ROS in the emergent setting, per wife intermittent fevers for a few weeks but no other focal neurological episodes   Past Medical History:  Diagnosis Date   Arthritis    OA AND PAIN LEFT HIP   CHB (complete heart block) ([facility])    a. MDT dual chamber pacemaker   HTN (hypertension)    Hyperlipidemia    Presence of permanent cardiac pacemaker    2000   Prostate cancer ([place]) 09/2014   Past Surgical History:  Procedure Laterality Date   ADENOIDECTOMY AS A CHILD     COLONOSCOPY WITH PROPOFOL N/A 12/30/2017   Procedure: COLONOSCOPY WITH PROPOFOL;  Surgeon: Ladene Artist, MD;  Location: [facility];  Service: Endoscopy;  Laterality: N/A;   JOINT REPLACEMENT     Right hip replacement Dr. Alvan Dame 01-05-18   LYMPHADENECTOMY Bilateral 11/06/2014   Procedure: PELVIC LYMPHADENECTOMY;  Surgeon: [name], MD;  Location: [facility];  Service: Urology;  Laterality: Bilateral;   PACEMAKER INSERTION  2000; 2012   MDT dual chamber pacemaker implanted 2000 with gen change 2012   POLYPECTOMY  12/30/2017   Procedure: POLYPECTOMY;  Surgeon: Ladene Artist, MD;  Location: [facility];  Service: Endoscopy;;   PROSTATE BIOPSY  09/05/14   REVERSE SHOULDER ARTHROPLASTY Left 10/15/2021   Procedure: REVERSE SHOULDER ARTHROPLASTY;  Surgeon: Justice Britain, MD;  Location: [facility];  Service: Orthopedics;  Laterality: Left;   ROBOT ASSISTED LAPAROSCOPIC RADICAL PROSTATECTOMY N/A 11/06/2014   Procedure: ROBOTIC ASSISTED LAPAROSCOPIC RADICAL PROSTATECTOMY LEVEL 2;  Surgeon: [name], MD;  Location: [facility];  Service: Urology;  Laterality: N/A;   TOTAL HIP ARTHROPLASTY Left 07/03/2014   Procedure: LEFT TOTAL HIP ARTHROPLASTY ANTERIOR APPROACH;  Surgeon: [name], MD;  Location: [facility];  Service: Orthopedics;  Laterality: Left;   TOTAL HIP ARTHROPLASTY Right 01/05/2018   Procedure: RIGHT TOTAL HIP ARTHROPLASTY ANTERIOR APPROACH;  Surgeon: Paralee Cancel, MD;  Location: [facility];  Service: Orthopedics;  Laterality: Right;  70 mins   WISDOM TEETH EXTRACTIONS     Current Outpatient Medications  Medication Instructions   acetaminophen (TYLENOL) 650 mg, Oral, Every 6 hours PRN   amoxicillin (AMOXIL) 500 MG capsule Take 4 capsules by mouth 1 hour prior to dental appointment for pre-medication.   atorvastatin (LIPITOR) 20 MG tablet Take 1 tablet by mouth once daily   calcium carbonate (OS-CAL - DOSED IN MG OF ELEMENTAL CALCIUM)  1250 (500 Ca) MG tablet 1 tablet, Oral, 2 times weekly   docusate sodium (COLACE) 100 mg, Oral, Daily PRN   ferrous sulfate 325 mg, Oral, Daily with breakfast   HYDROcodone-acetaminophen (NORCO/VICODIN) 5-325 MG tablet 1 tablet, Oral, At bedtime PRN   leuprolide (6 Month) (ELIGARD) 45 mg, Subcutaneous, Every 6 months   predniSONE (DELTASONE) 10 mg, Oral, Every morning   prochlorperazine  (COMPAZINE) 10 MG tablet Take 1 tablet (10 mg total) by mouth every 6 (six) hours as needed as needed for nausea or vomiting.   traMADol (ULTRAM) 50 MG tablet Take 1 tablet (50 mg total) by mouth every 8 (eight) hours as needed for Pain for up to 20 days   valsartan (DIOVAN) 160 mg, Oral, Daily   Xgeva 120 mg, Subcutaneous, Every 2 months     Family History  Problem Relation Age of Onset   Stroke Mother        multiple. 8.    Heart disease Mother        hx heart valve problem   Prostate cancer Father        73 of prostate cancer   Heart attack Neg Hx    Hypertension Neg Hx      Social History:  reports that he has never smoked. He has never used smokeless tobacco. He reports current alcohol use of about 2.0 - 3.0 standard drinks of alcohol per week. He reports that he does not use drugs.  Exam: Current vital signs: BP (!) 100/44 (BP Location: Left Arm)   Pulse 71   Temp 99.8 F (37.7 C) (Oral)   Resp 17   Ht 6' (1.829 m)   Wt 103 kg   SpO2 97%   BMI 30.80 kg/m  Vital signs in last 24 hours: Temp:  [98.7 F (37.1 C)-103.2 F (39.6 C)] 99.8 F (37.7 C) (12/02 1509) Pulse Rate:  [71-110] 71 (12/02 1509) Resp:  [17-32] 17 (12/02 1509) BP: (95-181)/(44-76) 100/44 (12/02 1509) SpO2:  [96 %-100 %] 97 % (12/02 1509)   Physical Exam  Constitutional: Appears acutely ill and pale Psych: Affect calm and cooperative Eyes: No scleral injection HENT: No oropharyngeal obstruction.  MSK: Left upper extremity range of motion limited secondary to prior shoulder replacement.  Some pain in bilateral hips due to bilateral hip replacements Cardiovascular: Regular rate and rhythm Respiratory: Effort normal, non-labored breathing GI: Soft.  No distension. There is no tenderness.  Skin: Warm dry and intact visible skin  Neuro: Mental Status: Patient is sleepy but oriented, some word finding difficulties such as being unable to name band easily.  He has visual neglect of the left side  without somatic neglect Cranial Nerves: II: Visual Fields are notable for some slight difficulty counting fingers in the left visual fields.  III,IV, VI: EOMI without ptosis or diploplia. V: Facial sensation is symmetric to temperature VII: Facial movement is symmetric.  VIII: hearing is intact to voice X: Uvula elevates symmetrically XI: Shoulder shrug is symmetric. XII: tongue is midline without atrophy or fasciculations.  Motor: Tone is normal. Bulk is normal.  Drift in bilateral legs as well as bilateral arms, without hitting the bed Sensory: Sensation is symmetric to light touch in the arms and legs Deep Tendon Reflexes: 3+ and symmetric in the brachioradialis and patellae.  Cerebellar: Heel-to-shin is intact bilaterally.  Left finger-to-nose is range of motion/pain/chronic weakness limited in the left upper extremity but markedly off baseline in the right upper extremity Gait:  Deferred  NIHSS total 7 Score breakdown: 1-point for drift in the left arm, 1-point for drift in the right arm, 1-point for drift in the left leg, 1-point for drift in the right leg, 1-point for right upper extremity ataxia, 1 point for mild aphasia, 1 point for mild neglect of the right side Performed at 4:10 PM   I have reviewed labs in epic and the results pertinent to this consultation are:  Basic Metabolic Panel: Recent Labs  Lab 06/08/2022 1720 06/13/22 1901 06/14/22 0254  NA 127* 125* 132*  K 4.4 4.3 4.0  CL 96* 98 103  CO2 20* 18* 20*  GLUCOSE 94 142* 110*  BUN 36* 30* 28*  CREATININE 1.24 1.17 1.12  CALCIUM 7.7* 6.9* 6.9*  MG  --  2.1  --     CBC: Recent Labs  Lab 06/11/2022 1720 06/13/22 0235 06/13/22 1901 06/14/22 0709 06/14/22 0844  WBC 3.3*  --  3.6*  --  2.4*  NEUTROABS  --   --  1.9  --  PENDING  HGB 7.2* 7.0* 8.1* 7.2* 7.3*  HCT 21.5* 21.2* 24.6* 22.2* 22.5*  MCV 82.7  --  85.4  --  86.2  PLT 42*  --  80*  --  43*    Coagulation Studies: Recent Labs     06/13/22 1901  LABPROT 19.8*  INR 1.7*    Lab Results  Component Value Date   HGBA1C 5.6 05/01/2020     Lab Results  Component Value Date   CHOL 173 05/01/2020   HDL 54 05/01/2020   LDLCALC 98 05/01/2020   LDLDIRECT 168.8 05/13/2013   TRIG 115 05/01/2020   CHOLHDL 3.2 05/01/2020      I have reviewed the images obtained:  Head CT  1. Focal hypoattenuation in the posterior right parietal lobe measuring up to 2.5 cm. This is concerning for acute or subacute infarct. 2. Focal cortical hypoattenuation in the posterior left occipital lobe is also concerning for acute or subacute infarct. 3. No other acute or focal cortical abnormalities. 4. Aspects is 9/10.  CTA head and neck  1. No emergent large vessel occlusion. 2. Minimal atherosclerotic changes within the proximal right ICA and cavernous internal carotid arteries bilaterally without significant stenosis. 3. Multiple sclerotic lesions throughout the cervical and upper thoracic spine and ribs bilaterally consistent with metastatic disease. No pathologic fractures are present.  CT 5 min contrast delayed scan 1. No pathologic enhancement to suggest metastatic disease. 2. Stable infarcts involving the posterior right parietal lobe and left occipital lobe. 3. Central left cerebellar infarct is better visualized. 4. Stable atrophy and white matter disease. This likely reflects the sequela of chronic microvascular ischemia.  Impression: Imaging is most consistent with multiple strokes in the posterior circulation distribution concerning for a central embolic source given minimal atherosclerotic changes, patient with fever of unknown source.  Recommendations: # Multifocal posterior circulation strokes - Transfer to [facility] for stroke monitoring and care - Stroke labs HgbA1c, fasting lipid panel - Repeat Head CT in ~24 hours on 12/3 - Frequent neuro checks - Echocardiogram, if transthoracic echocardiogram is negative  will need TEE to rule out vegetation - Hold antiplatelets due to thrombocytopenia, continue to monitor - Risk factor modification - Telemetry monitoring - Pacemaker device interrogation for any atrial fibrillation or other arrhythmias - Blood pressure goal   - Permissive hypertension to 180/100 (lower than guideline due to acute stroke w/ thrombocytopenia and c/f possible endocarditis w/ high bleeding risk) - PT consult, OT  consult, Speech consult, unless patient is back to baseline - Stroke team to follow  [place] [phone] Available 7 AM to 7 PM, outside these hours please contact Neurologist on call listed on AMION    Total critical care time: 50 minutes Critical care time was exclusive of separately billable procedures and treating other patients. Critical care was necessary to treat or prevent imminent or life-threatening deterioration. Critical care was time spent personally by me on the following activities: development of treatment plan with patient and/or surrogate as well as nursing, discussions with consultants/primary team, evaluation of patient's response to treatment, examination of patient, obtaining history from patient or surrogate, ordering and performing treatments and interventions, ordering and review of laboratory studies, ordering and review of radiographic studies, and re-evaluation of patient's condition as needed, as documented above.

## 2022-06-14 NOTE — Significant Event (Addendum)
Rapid Response Event Note   Reason for Call :  Activation of CODE STROKE  Initial Focused Assessment:  Called at 1523 for notification of CODE stroke on patient. Tele Neuro cart taken to bedside. Neurologist in route from [facility] to bedside. Per RN, CODE STROKE Head CT has already been completed prior to arrival from previous MD order. On arrival patient noted to have bilateral ataxia and tremor in both arms. No facial droop noted. This was new from previous assessment done in AM (see previous rapid response note).   Per Bhagat MD, RN to attempt to place 18g catheter over phone in case of perfusion scan. Unable to gain access- Ultrasound guided IV RN on stand by in ED in case of need of placement on arrival- was not needed.  Neurologist at bedside and patient transported by bedside RN and this RN to ED CT scanner. Patient alert and able to have conversation with staff throughout this time.  This RN, bedside RN and neurologist with patient throughout scans. Scans completed and transported back to room. Orders to transfer to [facility] placed by Neurologist.   Call time: 1523 End Time: [place], RN

## 2022-06-14 NOTE — Consult Note (Signed)
Code stroke activated at 1525. Stroke cart with pt at 1533. MD already contacted neuro prior to activation and Dr Curly Shores on her way to [facility] from [facility] to evaluate pt in person. Dr Curly Shores at bedside at 1540. No TNK d/t low platelets per neuro. Off cart at 1550.

## 2022-06-14 NOTE — Progress Notes (Signed)
  Subjective: Febrile to 103 overnight.  Temperature improved following Tylenol administration.  Foley draining clear urine.   Objective: Vital signs in last 24 hours: Temp:  [98.7 F (37.1 C)-103.2 F (39.6 C)] 98.7 F (37.1 C) (12/02 1045) Pulse Rate:  [86-110] 99 (12/02 1045) Resp:  [18-32] 24 (12/02 0629) BP: (95-181)/(46-81) 103/65 (12/02 1057) SpO2:  [96 %-100 %] 100 % (12/02 1045)  Intake/Output from previous day: 12/01 0701 - 12/02 0700 In: 17109.2 [P.O.:240; Blood:1119.2] Out: 25427 [Urine:16850]  Intake/Output this shift: No intake/output data recorded.  Physical Exam:  General: Alert and oriented GU:  Foley in place and draining clear urine  Lab Results: Recent Labs    06/13/22 0235 06/13/22 1901 06/14/22 0709  HGB 7.0* 8.1* 7.2*  HCT 21.2* 24.6* 22.2*   BMET Recent Labs    06/13/22 1901 06/14/22 0254  NA 125* 132*  K 4.3 4.0  CL 98 103  CO2 18* 20*  GLUCOSE 142* 110*  BUN 30* 28*  CREATININE 1.17 1.12  CALCIUM 6.9* 6.9*     Studies/Results: DG CHEST PORT 1 VIEW  Result Date: 06/13/2022 CLINICAL DATA:  Encounter for SOB 141880 SOB (shortness of breath) [id number] EXAM: PORTABLE CHEST 1 VIEW COMPARISON:  06/26/2014 FINDINGS: LEFT-sided pacemaker overlies normal cardiac silhouette. No effusion, infiltrate or pneumothorax. Versus arthroplasty LEFT shoulder. IMPRESSION: No acute cardiopulmonary process. Electronically Signed   By: Suzy Bouchard M.D.   On: 06/13/2022 14:34    Assessment/Plan: 71 year old male with resolving gross hematuria secondary to radiation cystitis.  History of prostate cancer, s/p RALP and adjuvant radiation therapy   -Plan for voiding trial this morning.  Check PVR if/when he voids. -Plan for hyperbaric oxygen therapy next week   LOS: 2 days   Ellison Hughs, MD [facility] Pager: [phone]  06/14/2022, 11:08 AM

## 2022-06-14 NOTE — Progress Notes (Signed)
PROGRESS NOTE    Devin Mercado  JXB:[id number] DOB: February 28, 1951 DOA: 05/19/2022 PCP: [name], MD     Brief Narrative:  Devin Mercado is a 71 year old male with past medical history significant for prostate cancer with bone mets, followed by oncology at [facility], anemia associated with bone marrow infiltration, pancytopenia from bone marrow suppression from [place].  Who presents to the hospital with hematuria.  He presented to his oncologist office, Foley catheter was placed and irrigated and was removed and sent home.  During the night, he woke up with pain and presented to the emergency department.  At that time, the Foley catheter was placed and bladder irrigation was started.  Patient admitted and urology consulted.   New events last 24 hours / Subjective: Febrile overnight Tmax 103.  Patient Foley catheter and bladder irrigation was discontinued this morning.  Patient was sitting in recliner without new complaints.  After rounds, I was notified by RN regarding patient mentation, seems to be "out of it", soft blood pressure.  Rapid response was called.  I evaluated patient again at bedside.  Patient was alert and oriented, following all commands appropriately, appeared to be pale.  Asked for bladder scan, 0 mL seen.  CBC was ordered, WBC 2.4, hemoglobin 7.3, platelet 43.  Assessment & Plan:   Principal Problem:   Hematuria Active Problems:   Hyperlipidemia   Essential hypertension   Atrioventricular block, complete (HCC)   PPM-[employer]   Prostate cancer (HCC)   Malignant neoplasm metastatic to bone (HCC)   Acute urinary retention   Hyponatremia   Hypokalemia   Pancytopenia (HCC)   Acute urinary retention with hematuria, hemorrhagic cystitis -Urology following -Continuous bladder irrigation discontinued today. Voiding trial today -Flomax   Acute blood loss anemia, pancytopenia, symptomatic anemia -Transfused 2 units platelets -Transfused 1 unit packed red blood  cell -Trend CBC.  Transfuse another unit packed red blood cell for symptomatic anemia with hemoglobin 7.3   Fever -Chest x-ray without acute cardiopulmonary process -Blood cultures pending -UA was unremarkable.  Patient stated that he was prescribed Cipro as outpatient couple of days ago in the urologist office.  Resume   Metastatic prostate cancer to bone, pancytopenia -Followed by oncology at [facility] -Prednisone   Hypertension -Valsartan   Hyperlipidemia -Atorvastatin   Euthyroid sick syndrome -Elevated TSH but normal T4 -Repeat TSH as outpatient in 4 to 6 weeks      DVT prophylaxis:  SCDs Start: 06/13/22 0135  Code Status: Full code Family Communication: No family at bedside Disposition Plan:  Status is: Inpatient Remains inpatient appropriate because: Giving blood transfusion today  Antimicrobials:  Anti-infectives (From admission, onward)    Start     Dose/Rate Route Frequency Ordered Stop   06/14/22 0900  ciprofloxacin (CIPRO) tablet 500 mg        500 mg Oral 2 times daily 06/14/22 0813          Objective: Vitals:   06/14/22 0724 06/14/22 0900 06/14/22 1045 06/14/22 1057  BP:  125/71 (!) 95/58 103/65  Pulse:   99   Resp:      Temp: (!) 100.5 F (38.1 C)  98.7 F (37.1 C)   TempSrc: Oral  Oral   SpO2:   100%   Weight:      Height:        Intake/Output Summary (Last 24 hours) at 06/14/2022 1314 Last data filed at 06/14/2022 1109 Gross per 24 hour  Intake 4443.5 ml  Output 5500 ml  Net -1056.5 ml   Filed Weights   05/30/2022 1716 06/09/2022 2359  Weight: 102.1 kg 103 kg    Examination:  General exam: Appears calm, pale Respiratory system: Clear to auscultation. Respiratory effort normal. No respiratory distress. No conversational dyspnea.  Cardiovascular system: S1 & S2 heard, RRR. No murmurs. No pedal edema. Gastrointestinal system: Abdomen is nondistended, soft and nontender. Normal bowel sounds heard. Central nervous system: Alert and  oriented. No focal neurological deficits. Speech clear.  Extremities: Symmetric in appearance  Skin: No rashes, lesions or ulcers on exposed skin  Psychiatry: Judgement and insight appear normal. Mood & affect appropriate.   Data Reviewed: I have personally reviewed following labs and imaging studies  CBC: Recent Labs  Lab 05/25/2022 1720 06/13/22 0235 06/13/22 1901 06/14/22 0709 06/14/22 0844  WBC 3.3*  --  3.6*  --  2.4*  NEUTROABS  --   --  1.9  --  PENDING  HGB 7.2* 7.0* 8.1* 7.2* 7.3*  HCT 21.5* 21.2* 24.6* 22.2* 22.5*  MCV 82.7  --  85.4  --  86.2  PLT 42*  --  80*  --  43*   Basic Metabolic Panel: Recent Labs  Lab 05/30/2022 1720 06/13/22 1901 06/14/22 0254  NA 127* 125* 132*  K 4.4 4.3 4.0  CL 96* 98 103  CO2 20* 18* 20*  GLUCOSE 94 142* 110*  BUN 36* 30* 28*  CREATININE 1.24 1.17 1.12  CALCIUM 7.7* 6.9* 6.9*  MG  --  2.1  --    GFR: Estimated Creatinine Clearance: 72.9 mL/min (by C-G formula based on SCr of 1.12 mg/dL). Liver Function Tests: Recent Labs  Lab 06/05/2022 1720  AST 18  ALT 11  ALKPHOS 70  BILITOT 0.6  PROT 6.4*  ALBUMIN 3.6   Recent Labs  Lab 05/18/2022 1720  LIPASE 10*   No results for input(s): "AMMONIA" in the last 168 hours. Coagulation Profile: Recent Labs  Lab 06/13/22 1901  INR 1.7*   Cardiac Enzymes: No results for input(s): "CKTOTAL", "CKMB", "CKMBINDEX", "TROPONINI" in the last 168 hours. BNP (last 3 results) No results for input(s): "PROBNP" in the last 8760 hours. HbA1C: No results for input(s): "HGBA1C" in the last 72 hours. CBG: Recent Labs  Lab 06/14/22 1056  GLUCAP 121*   Lipid Profile: No results for input(s): "CHOL", "HDL", "LDLCALC", "TRIG", "CHOLHDL", "LDLDIRECT" in the last 72 hours. Thyroid Function Tests: Recent Labs    06/11/2022 1932  TSH 19.527*  FREET4 0.70   Anemia Panel: No results for input(s): "VITAMINB12", "FOLATE", "FERRITIN", "TIBC", "IRON", "RETICCTPCT" in the last 72 hours. Sepsis  Labs: Recent Labs  Lab 06/14/22 0254  LATICACIDVEN 1.0    Recent Results (from the past 240 hour(s))  Culture, blood (Routine X 2) w Reflex to ID Panel     Status: None (Preliminary result)   Collection Time: 06/13/22  7:01 PM   Specimen: BLOOD  Result Value Ref Range Status   Specimen Description   Final    BLOOD LEFT ANTECUBITAL Performed at [facility], [place] [address] [place], [place] [address]    Special Requests   Final    BOTTLES DRAWN AEROBIC ONLY Blood Culture adequate volume Performed at [facility] [address], [place], [place] [address]    Culture   Final    NO GROWTH < 12 HOURS Performed at [facility] [address] [place],  [address]    Report Status PENDING  Incomplete  Culture, blood (  Routine X 2) w Reflex to ID Panel     Status: None (Preliminary result)   Collection Time: 06/13/22  7:04 PM   Specimen: BLOOD  Result Value Ref Range Status   Specimen Description   Final    BLOOD BLOOD RIGHT HAND Performed at [facility] [address] [place], [place] [address]    Special Requests   Final    BOTTLES DRAWN AEROBIC ONLY Blood Culture results may not be optimal due to an inadequate volume of blood received in culture bottles Performed at [facility] [address] [place], [place] [address]    Culture   Final    NO GROWTH < 12 HOURS Performed at [facility] [address], [place], [place] [address]    Report Status PENDING  Incomplete      Radiology Studies: DG CHEST PORT 1 VIEW  Result Date: 06/13/2022 CLINICAL DATA:  Encounter for SOB 141880 SOB (shortness of breath) 141880 EXAM: PORTABLE CHEST 1 VIEW COMPARISON:  06/26/2014 FINDINGS: LEFT-sided pacemaker overlies normal cardiac silhouette. No effusion, infiltrate or pneumothorax. Versus arthroplasty LEFT shoulder. IMPRESSION: No acute cardiopulmonary process. Electronically Signed   By:  Suzy Bouchard M.D.   On: 06/13/2022 14:34      Scheduled Meds:  sodium chloride   Intravenous Once   atorvastatin  20 mg Oral Daily   ciprofloxacin  500 mg Oral BID   irbesartan  75 mg Oral Daily   lidocaine  1 patch Transdermal Q24H   predniSONE  10 mg Oral q morning   tamsulosin  0.4 mg Oral QPC supper   Continuous Infusions:  sodium chloride irrigation     sodium chloride irrigation       LOS: 2 days     Dessa Phi, DO [facility] 06/14/2022, 1:14 PM   Available via Epic secure chat 7am-7pm After these hours, please refer to coverage provider listed on [email or web address]

## 2022-06-14 NOTE — Progress Notes (Addendum)
Called to patient's room due to low BP and increased drowsiness. Patient was arousable but not answering all questions. Patient was able to hold cup during morning medication pass and can no longer grip cup or lift it to his mouth.MD and rapid response notified of change in condition. Rapid response and MD en route to assess patient.   06/14/22 1050  Charting Type  Focused Reassessment Changes Noted Neurological  Neurological  Neuro ([facility]) X  Orientation Level Oriented to person;Oriented to place;Disoriented to time  R Finger to Nose ([place]) Ataxia  Neuro Symptoms Drowsiness

## 2022-06-14 NOTE — Significant Event (Signed)
Rapid Response Event Note   Reason for Call :  Increased Lethargy/change of level of consciousness   Initial Focused Assessment:  Per bedside RN, Patient got to chair with assistance earlier in shift and was alert. On arrival patient resting in chair with eyes closed. When spoken to immediately opened eyes. Patient answered correctly to person, place and situation orientation questions. Could not appropriately answer to what year it was on assessment, but then explained to RN why he was admitted to hospital including details of canceling a vacation due to hospitalization. When asked his name patient gave RN both name and birthday then stated "I have been asked that many times". Patient tracking RN throughout assessment. No apparent deficits or distress noted.   Vitals taken on arrival and were as followed:  BP 103/65 (72) EEG HR 99 (paced) SpO2 98% 2L [place] Temp 98.7 No obvious respiratory distress. Lungs auscultated bilaterally- clear upper lobes and diminished lower lobes. No cough on assessment. Patient denies any new pain.  Patient previously febrile (T. Max 103), but fever had broke prior to assessment (see MAR for tylenol administration times, bags of ice were also applied previously per patient statement)  RRRN asked patient to grip hands. Patient had hands resting in lap without tremor and lifted off lap to squeeze RN's hands. Patient does report having limited mobility in left shoulder which can cause pain at times- denies at time of assessment. Grip bilaterally equal and strong. No signs of weakness/tremor on assessment at this time. Patient wiggled toes on command. Pulses equal and 2+ bilaterally in hands and feet.   RN concerned about patient being pale. Choi MD in room at this time and discussed with this RN and bedside RN about repeating CBC, doing bladder scan and possibly replacing foley catheter if there was a increased urine in bladder as previous foley was removed prior to my  assessment. Patient had been on Continuous bladder irrigation- concern about re-bleeding discussed. CBC and bladder scan ordered.    Interventions:  CBC and bladder scan ordered by Maylene Roes MD  Plan of Care:  Transfer to progressive for more frequent monitoring and in case of need to restart CBI.    Event Summary:   MD Notified: Notified by bedside RN prior to arrival- arrived shortly after RR RN did.  Call Time: [place] Time: 1055  End Time: [place], RN

## 2022-06-14 NOTE — Progress Notes (Addendum)
  PROGRESS NOTE  Patient's wife noted that while patient was trying to drink from a straw, he showed cerebellar signs and bumping his cup to his face. I evaluated patient at bedside, RN also present. Patient is back in bed and states he is feeling better from my earlier eval. He is conversational and oriented. Exam showed CN 2-12 grossly in tact, strength 5/5 all extremities, grip strength equal bilaterally, and no sensation deficits noted. Finger-to-nose on right is grossly abnormal. RN states that patient did not have trouble taking morning meds around 9am.   -Stat CT head -Cannot obtain MRI due to incompatible pacemaker. There is a note from [facility] in 2021 where they state "MRI is contraindicated due to the pacemaker (our staff called the DMP staff and they did not feel this could be done safely with this model)"  -Neuro consult, spoke with Dr. Curly Shores  Total critical care time: 42 minutes. Time: 2:14pm - 2:56pm Critical care time was exclusive of separately billable procedures and treating other patients. Critical care was necessary to treat or prevent imminent or life-threatening deterioration. Critical care was time spent personally by me on the following activities: development of treatment plan with patient and/or surrogate as well as nursing, discussions with consultants, evaluation of patient's response to treatment, examination of patient, obtaining history from patient or surrogate, ordering and performing treatments and interventions, ordering and review of laboratory studies, ordering and review of radiographic studies, pulse oximetry and re-evaluation of patient's condition.    Dessa Phi, DO [facility] 06/14/2022, 2:37 PM  Available via Epic secure chat 7am-7pm After these hours, please refer to coverage provider listed on [email or web address]

## 2022-06-14 NOTE — Progress Notes (Signed)
Temp back up to 103.0.  Ice packs replaced.  Tylenol given early per provider order.      06/13/22 2350  Vitals  Temp (!) 103 F (39.4 C)  Temp Source Oral  BP (!) 145/73  MAP (mmHg) 94  BP Location Left Arm  BP Method Automatic  Patient Position (if appropriate) Lying  Pulse Rate (!) 104  Pulse Rate Source Dinamap  ECG Heart Rate (!) 101  Resp (!) 28  Level of Consciousness  Level of Consciousness Alert  MEWS COLOR  MEWS Score Color Red  Oxygen Therapy  SpO2 99 %  O2 Device Nasal Cannula  O2 Flow Rate (L/min) 2 L/min  MEWS Score  MEWS Temp 2  MEWS Systolic 0  MEWS Pulse 1  MEWS RR 2  MEWS LOC 0  MEWS Score 5   Ayesha Mohair BSN RN [facility] 06/14/2022, 12:44 AM

## 2022-06-15 ENCOUNTER — Inpatient Hospital Stay (HOSPITAL_COMMUNITY): Payer: Medicare PPO

## 2022-06-15 DIAGNOSIS — R338 Other retention of urine: Secondary | ICD-10-CM | POA: Diagnosis not present

## 2022-06-15 DIAGNOSIS — I480 Paroxysmal atrial fibrillation: Secondary | ICD-10-CM | POA: Diagnosis not present

## 2022-06-15 DIAGNOSIS — I442 Atrioventricular block, complete: Secondary | ICD-10-CM | POA: Diagnosis not present

## 2022-06-15 DIAGNOSIS — I10 Essential (primary) hypertension: Secondary | ICD-10-CM

## 2022-06-15 DIAGNOSIS — E876 Hypokalemia: Secondary | ICD-10-CM

## 2022-06-15 DIAGNOSIS — I63432 Cerebral infarction due to embolism of left posterior cerebral artery: Secondary | ICD-10-CM

## 2022-06-15 DIAGNOSIS — D61818 Other pancytopenia: Secondary | ICD-10-CM | POA: Diagnosis not present

## 2022-06-15 DIAGNOSIS — C7951 Secondary malignant neoplasm of bone: Secondary | ICD-10-CM

## 2022-06-15 DIAGNOSIS — I639 Cerebral infarction, unspecified: Secondary | ICD-10-CM | POA: Diagnosis not present

## 2022-06-15 DIAGNOSIS — R310 Gross hematuria: Secondary | ICD-10-CM | POA: Diagnosis not present

## 2022-06-15 LAB — ECHOCARDIOGRAM COMPLETE
Calc EF: 45.2 %
Height: 72 in
S' Lateral: 3.3 cm
Single Plane A2C EF: 42 %
Single Plane A4C EF: 47.7 %
Weight: 3633.18 oz

## 2022-06-15 LAB — CBC
HCT: 23.8 % — ABNORMAL LOW (ref 39.0–52.0)
HCT: 23 % — ABNORMAL LOW (ref 39.0–52.0)
Hemoglobin: 8.2 g/dL — ABNORMAL LOW (ref 13.0–17.0)
Hemoglobin: 7.7 g/dL — ABNORMAL LOW (ref 13.0–17.0)
MCH: 28.4 pg (ref 26.0–34.0)
MCH: 27.6 pg (ref 26.0–34.0)
MCHC: 34.5 g/dL (ref 30.0–36.0)
MCHC: 33.5 g/dL (ref 30.0–36.0)
MCV: 82.4 fL (ref 80.0–100.0)
MCV: 82.4 fL (ref 80.0–100.0)
Platelets: 41 10*3/uL — ABNORMAL LOW (ref 150–400)
Platelets: 33 10*3/uL — ABNORMAL LOW (ref 150–400)
RBC: 2.89 MIL/uL — ABNORMAL LOW (ref 4.22–5.81)
RBC: 2.79 MIL/uL — ABNORMAL LOW (ref 4.22–5.81)
RDW: 17.2 % — ABNORMAL HIGH (ref 11.5–15.5)
RDW: 17.4 % — ABNORMAL HIGH (ref 11.5–15.5)
WBC: 5.4 10*3/uL (ref 4.0–10.5)
WBC: 3.9 10*3/uL — ABNORMAL LOW (ref 4.0–10.5)
nRBC: 0 % (ref 0.0–0.2)
nRBC: 0 % (ref 0.0–0.2)

## 2022-06-15 LAB — BASIC METABOLIC PANEL
Anion gap: 10 (ref 5–15)
BUN: 28 mg/dL — ABNORMAL HIGH (ref 8–23)
CO2: 18 mmol/L — ABNORMAL LOW (ref 22–32)
Calcium: 6.4 mg/dL — CL (ref 8.9–10.3)
Chloride: 102 mmol/L (ref 98–111)
Creatinine, Ser: 1.43 mg/dL — ABNORMAL HIGH (ref 0.61–1.24)
GFR, Estimated: 52 mL/min — ABNORMAL LOW (ref >60)
Glucose, Bld: 109 mg/dL — ABNORMAL HIGH (ref 70–99)
Potassium: 3.5 mmol/L (ref 3.5–5.1)
Sodium: 130 mmol/L — ABNORMAL LOW (ref 135–145)

## 2022-06-15 LAB — PROCALCITONIN: Procalcitonin: 14.08 ng/mL

## 2022-06-15 LAB — T3, FREE: T3, Free: 1.9 pg/mL — ABNORMAL LOW (ref 2.0–4.4)

## 2022-06-15 MED ORDER — CALCIUM GLUCONATE-NACL 1-0.675 GM/50ML-% IV SOLN
1.0000 g | Freq: Once | INTRAVENOUS | Status: AC
  Administered 2022-06-15: 1000 mg via INTRAVENOUS
  Filled 2022-06-15: qty 50

## 2022-06-15 MED ORDER — FAMOTIDINE 20 MG PO TABS
20.0000 mg | ORAL_TABLET | Freq: Every day | ORAL | Status: DC
  Administered 2022-06-15 – 2022-06-22 (×8): 20 mg via ORAL
  Filled 2022-06-15 (×8): qty 1

## 2022-06-15 MED ORDER — LORAZEPAM 2 MG/ML IJ SOLN: 0.5000 mg | Freq: Once | INTRAMUSCULAR | Status: DC | PRN

## 2022-06-15 MED ORDER — SODIUM BICARBONATE 650 MG PO TABS
650.0000 mg | ORAL_TABLET | Freq: Three times a day (TID) | ORAL | Status: DC
  Administered 2022-06-15 – 2022-06-22 (×22): 650 mg via ORAL
  Filled 2022-06-15 (×24): qty 1

## 2022-06-15 NOTE — Progress Notes (Signed)
PROGRESS NOTE    Devin Mercado  UDJ:[id number] DOB: Nov 02, 1950 DOA: 05/27/2022 PCP: [name], MD     Brief Narrative:   Devin Mercado is a 71 year old male with past medical history significant for prostate cancer with bone mets, followed by oncology at [facility], anemia associated with bone marrow infiltration, pancytopenia from bone marrow suppression from [place].  Who presents to the hospital with hematuria.  He presented to his oncologist office, Foley catheter was placed and irrigated and was removed and sent home.  During the night, he woke up with pain and presented to the emergency department.  At that time, the Foley catheter was placed and bladder irrigation was started.  Patient admitted and urology consulted.  Status post CBI, Foley catheter removed 12/2, patient was transferred to [facility] 12/2 after code stroke was called as he noted to have right upper extremity weakness, with CT head significant for embolic CVA.  New events last 24 hours / Subjective:  Tmax is 100.5 over last 24 hours, as well patient did develop diarrhea, watery, he denies any abdominal pain, nausea or vomiting.  Assessment & Plan:   Principal Problem:   Hematuria Active Problems:   Hyperlipidemia   Essential hypertension   Atrioventricular block, complete (HCC)   PPM-[employer]   Prostate cancer (HCC)   Malignant neoplasm metastatic to bone [facility])   Acute urinary retention   Hyponatremia   Hypokalemia   Pancytopenia (HCC)   Acute urinary retention with hematuria, hemorrhagic cystitis -Urology following -Continuous bladder irrigation discontinued 12/2. V -No further evidence of retention -Flomax  Acute CVA -Management per neurology team, felt to be secondary to embolic source -Plan for 2D echo, and TTE -Discussed with Dr. Lovena Le, he will interrogate his pacemaker -Continue with telemonitoring, appears to be with paced rhythm -Not on antiplatelet therapy due to  thrombocytopenia and anemia. .   Acute blood loss anemia, pancytopenia, symptomatic anemia -Transfused 2 units platelets -Transfused 1 unit packed red blood cell -Continue to monitor CBC closely and transfuse as needed.   Fever -Fever of 103 on 12/2 -Chest x-ray without acute cardiopulmonary process -Blood cultures pending, but so far remains negative -UA was unremarkable.   -Patient in C[place], but he did develop watery diarrhea today, so we will hold for today. -Will check GI panel -Will consult ID   Metastatic prostate cancer to bone, pancytopenia -Followed by oncology at [facility] -Prednisone   Hypertension -On valsartan, will hold to allow for permissive hypertension   Hyperlipidemia -Atorvastatin   Euthyroid sick syndrome -Elevated TSH but normal T4 -Repeat TSH as outpatient in 4 to 6 weeks   Hyponatremia -Improving  Hypocalcemia  -Repleted   DVT prophylaxis:  SCDs Start: 06/13/22 0135  Code Status: Full code Family Communication: Discussed with wife Dr. [name] at bedside Disposition Plan:  Status is: Inpatient Remains inpatient appropriate because: Giving blood transfusion today  Antimicrobials:  Anti-infectives (From admission, onward)    Start     Dose/Rate Route Frequency Ordered Stop   06/14/22 0900  ciprofloxacin (CIPRO) tablet 500 mg  Status:  Discontinued        500 mg Oral 2 times daily 06/14/22 0813 06/15/22 0927        Objective: Vitals:   06/15/22 0005 06/15/22 0205 06/15/22 0322 06/15/22 0955  BP: 119/67 121/67 (!) 109/57 (!) 120/59  Pulse: 95 (!) 106 (!) 52 (!) 44  Resp: (!) 21 17 (!) 22 16  Temp: 99 F (37.2 C) (!) 100.4 F (38  C) 99.6 F (37.6 C) 98.3 F (36.8 C)  TempSrc: Oral Axillary Oral Oral  SpO2:  95% 97% 98%  Weight:      Height:        Intake/Output Summary (Last 24 hours) at 06/15/2022 1219 Last data filed at 06/14/2022 2100 Gross per 24 hour  Intake 630 ml  Output --  Net 630 ml   Filed Weights   05/14/2022 1716  06/03/2022 2359  Weight: 102.1 kg 103 kg    Examination:   Awake Alert, Oriented X 3, frail, overall he is appropriate, answering most questions appropriately, appears to be minimally confused. Symmetrical Chest wall movement, Good air movement bilaterally, CTAB RRR,No Gallops,Rubs or new Murmurs, No Parasternal Heave +ve B.Sounds, Abd Soft, No tenderness, No rebound - guarding or rigidity. No Cyanosis, Clubbing or edema, No new Rash or bruise , he remains with residual right upper extremity weakness.  Data Reviewed: I have personally reviewed following labs and imaging studies  CBC: Recent Labs  Lab 05/29/2022 1720 06/13/22 0235 06/13/22 1901 06/14/22 0709 06/14/22 0844 06/14/22 2302 06/15/22 0814  WBC 3.3*  --  3.6*  --  2.4* 5.4 3.9*  NEUTROABS  --   --  1.9  --  1.4*  --   --   HGB 7.2*   < > 8.1* 7.2* 7.3* 8.2* 7.7*  HCT 21.5*   < > 24.6* 22.2* 22.5* 23.8* 23.0*  MCV 82.7  --  85.4  --  86.2 82.4 82.4  PLT 42*  --  80*  --  43* 41* 33*   < > = values in this interval not displayed.   Basic Metabolic Panel: Recent Labs  Lab 05/30/2022 1720 06/13/22 1901 06/14/22 0254 06/15/22 0814  NA 127* 125* 132* 130*  K 4.4 4.3 4.0 3.5  CL 96* 98 103 102  CO2 20* 18* 20* 18*  GLUCOSE 94 142* 110* 109*  BUN 36* 30* 28* 28*  CREATININE 1.24 1.17 1.12 1.43*  CALCIUM 7.7* 6.9* 6.9* 6.4*  MG  --  2.1  --   --    GFR: Estimated Creatinine Clearance: 57.1 mL/min (A) (by C-G formula based on SCr of 1.43 mg/dL (H)). Liver Function Tests: Recent Labs  Lab 05/31/2022 1720  AST 18  ALT 11  ALKPHOS 70  BILITOT 0.6  PROT 6.4*  ALBUMIN 3.6   Recent Labs  Lab 05/29/2022 1720  LIPASE 10*   No results for input(s): "AMMONIA" in the last 168 hours. Coagulation Profile: Recent Labs  Lab 06/13/22 1901  INR 1.7*   Cardiac Enzymes: No results for input(s): "CKTOTAL", "CKMB", "CKMBINDEX", "TROPONINI" in the last 168 hours. BNP (last 3 results) No results for input(s): "PROBNP" in  the last 8760 hours. HbA1C: No results for input(s): "HGBA1C" in the last 72 hours. CBG: Recent Labs  Lab 06/14/22 1056 06/14/22 1537  GLUCAP 121* 172*   Lipid Profile: No results for input(s): "CHOL", "HDL", "LDLCALC", "TRIG", "CHOLHDL", "LDLDIRECT" in the last 72 hours. Thyroid Function Tests: Recent Labs    06/07/2022 1932  TSH 19.527*  FREET4 0.70   Anemia Panel: No results for input(s): "VITAMINB12", "FOLATE", "FERRITIN", "TIBC", "IRON", "RETICCTPCT" in the last 72 hours. Sepsis Labs: Recent Labs  Lab 06/14/22 0254 06/15/22 0814  PROCALCITON  --  14.08  LATICACIDVEN 1.0  --     Recent Results (from the past 240 hour(s))  Culture, blood (Routine X 2) w Reflex to ID Panel     Status: None (Preliminary result)  Collection Time: 06/13/22  7:01 PM   Specimen: BLOOD  Result Value Ref Range Status   Specimen Description   Final    BLOOD LEFT ANTECUBITAL Performed at [facility] [address], [place], [place] [address]    Special Requests   Final    BOTTLES DRAWN AEROBIC ONLY Blood Culture adequate volume Performed at [facility] [address] [place], [place] [address]    Culture   Final    NO GROWTH 2 DAYS Performed at [facility] [address], [place], [place] [address]    Report Status PENDING  Incomplete  Culture, blood (Routine X 2) w Reflex to ID Panel     Status: None (Preliminary result)   Collection Time: 06/13/22  7:04 PM   Specimen: BLOOD  Result Value Ref Range Status   Specimen Description   Final    BLOOD BLOOD RIGHT HAND Performed at [facility] [address], [place], [place] [address]    Special Requests   Final    BOTTLES DRAWN AEROBIC ONLY Blood Culture results may not be optimal due to an inadequate volume of blood received in culture bottles Performed at [facility] [address], [place], [place] [address]    Culture   Final    NO GROWTH 2  DAYS Performed at [facility] [address], [place], [place] [address]    Report Status PENDING  Incomplete      Radiology Studies: CT ANGIO HEAD NECK W WO CM W PERF (CODE STROKE)  Result Date: 06/14/2022 CLINICAL DATA:  Neuro deficit, acute, stroke suspected. Right-sided weakness. EXAM: CT ANGIOGRAPHY HEAD AND NECK TECHNIQUE: Multidetector CT imaging of the head and neck was performed using the standard protocol during bolus administration of intravenous contrast. Multiplanar CT image reconstructions and MIPs were obtained to evaluate the vascular anatomy. Carotid stenosis measurements (when applicable) are obtained utilizing NASCET criteria, using the distal internal carotid diameter as the denominator. RADIATION DOSE REDUCTION: This exam was performed according to the departmental dose-optimization program which includes automated exposure control, adjustment of the mA and/or kV according to patient size and/or use of iterative reconstruction technique. CONTRAST:  177m OMNIPAQUE IOHEXOL 350 MG/ML SOLN COMPARISON:  CT head without contrast 06/14/2022 at 2:50 p.m. FINDINGS: CTA NECK FINDINGS Aortic arch: Minimal atherosclerotic changes are present the distal aorta. Three vessel arch configuration is present. No significant stenosis is present at the great vessel origins. Right carotid system: Right common carotid artery is within normal limits. Minimal calcifications are present proximal right ICA without significant stenosis. Mild tortuosity is present in the distal cervical right ICA without significant stenosis. Left carotid system: The left common carotid artery is within normal limits. Bifurcation is unremarkable. Mild tortuosity is present cervical left ICA without significant stenosis. Vertebral arteries: The left vertebral artery is the dominant vessel. Both vertebral arteries originate from the subclavian arteries without significant stenosis. No significant stenosis is present in  either vertebral artery in the neck. Skeleton: Multiple sclerotic lesions are present throughout the cervical and upper thoracic spine. Diffuse vertebral body involvement is present at T2 and T3. Asymmetric right-sided involvement is present at T1 and T2. No pathologic fractures are present. Sclerotic lesions are present within ribs bilaterally. Other neck: Soft tissues the neck are otherwise unremarkable. Salivary glands are within normal limits. Thyroid is normal. No significant adenopathy is present. No focal mucosal or submucosal lesions are present. Upper chest: The lung  apices are clear. Review of the MIP images confirms the above findings CTA HEAD FINDINGS Anterior circulation: Minimal atherosclerotic changes are present within the cavernous internal carotid arteries bilaterally. The A1 and M1 segments are normal. The anterior communicating artery is patent. MCA bifurcations are within normal limits bilaterally. The ACA and MCA branch vessels are within normal limits bilaterally. Posterior circulation: The left vertebral artery is dominant vessel. PICA origins are visualized and normal. Vertebrobasilar junction and basilar artery is normal. Both posterior cerebral arteries originate from basilar tip. The PCA branch vessels are normal. Venous sinuses: The dural sinuses are patent. The straight sinus and deep cerebral veins are intact. Cortical veins are within normal limits. No significant vascular malformation is evident. Anatomic variants: None Review of the MIP images confirms the above findings IMPRESSION: 1. No emergent large vessel occlusion. 2. Minimal atherosclerotic changes within the proximal right ICA and cavernous internal carotid arteries bilaterally without significant stenosis. 3. Multiple sclerotic lesions throughout the cervical and upper thoracic spine and ribs bilaterally consistent with metastatic disease. No pathologic fractures are present. Electronically Signed   By: San Morelle  M.D.   On: 06/14/2022 18:26   CT HEAD WO CONTRAST (5MM)  Result Date: 06/14/2022 CLINICAL DATA:  Abnormal CT of the head. Delayed images were obtained after CTA head to evaluate for possible metastatic disease. EXAM: CT HEAD WITHOUT CONTRAST TECHNIQUE: Contiguous axial images were obtained from the base of the skull through the vertex without intravenous contrast. RADIATION DOSE REDUCTION: This exam was performed according to the departmental dose-optimization program which includes automated exposure control, adjustment of the mA and/or kV according to patient size and/or use of iterative reconstruction technique. COMPARISON:  CT head without contrast 06/14/2022 at 2:50 p.m. FINDINGS: Brain: Delayed images demonstrate no pathologic enhancement. Infarcts involving the posterior right parietal lobe and left occipital lobe are again seen. Central left cerebellar infarct is better visualized. Mild atrophy and white matter changes are stable. No significant interval change is present. Vascular: Normal intravascular enhancement is present. Skull: Calvarium is intact. No focal lytic or blastic lesions are present. No significant extracranial soft tissue lesion is present. Sinuses/Orbits: The paranasal sinuses and mastoid air cells are clear. The globes and orbits are within normal limits. IMPRESSION: 1. No pathologic enhancement to suggest metastatic disease. 2. Stable infarcts involving the posterior right parietal lobe and left occipital lobe. 3. Central left cerebellar infarct is better visualized. 4. Stable atrophy and white matter disease. This likely reflects the sequela of chronic microvascular ischemia. Electronically Signed   By: San Morelle M.D.   On: 06/14/2022 18:14   CT HEAD CODE STROKE WO CONTRAST  Result Date: 06/14/2022 CLINICAL DATA:  Code stroke. Neuro deficit, acute, stroke suspected. Right-sided weakness. EXAM: CT HEAD WITHOUT CONTRAST TECHNIQUE: Contiguous axial images were obtained  from the base of the skull through the vertex without intravenous contrast. RADIATION DOSE REDUCTION: This exam was performed according to the departmental dose-optimization program which includes automated exposure control, adjustment of the mA and/or kV according to patient size and/or use of iterative reconstruction technique. COMPARISON:  . FINDINGS: Brain: Focal hypoattenuation is present in the posterior right parietal lobe measuring up to 2.5 cm. Focal cortical hypoattenuation is also present in the posterior left occipital lobe on image 12 series 3 and image 41 of series 7. No other acute or focal cortical abnormalities are present. Deep brain nuclei are within normal limits. Insular ribbon is normal bilaterally. Cavum septum pellucidum noted. Ventricles are otherwise within normal  limits. No significant extraaxial fluid collection is present. The brainstem and cerebellum are within normal limits. Vascular: No hyperdense vessel or unexpected calcification. Skull: Calvarium is intact. No focal lytic or blastic lesions are present. No significant extracranial soft tissue lesion is present. Sinuses/Orbits: The paranasal sinuses and mastoid air cells are clear. The globes and orbits are within normal limits. ASPECTS [facility] Stroke Program Early CT Score) - Ganglionic level infarction (caudate, lentiform nuclei, internal capsule, insula, M1-M3 cortex): 6/7 - Supraganglionic infarction (M4-M6 cortex): 3/3 Total score (0-10 with 10 being normal): 9/10 IMPRESSION: 1. Focal hypoattenuation in the posterior right parietal lobe measuring up to 2.5 cm. This is concerning for acute or subacute infarct. 2. Focal cortical hypoattenuation in the posterior left occipital lobe is also concerning for acute or subacute infarct. 3. No other acute or focal cortical abnormalities. 4. Aspects is 9/10. The above was relayed via text pager to Dr. Lyn Records on 06/14/2022 at 15:08. Page Electronically Signed   By: San Morelle  M.D.   On: 06/14/2022 15:09   DG CHEST PORT 1 VIEW  Result Date: 06/13/2022 CLINICAL DATA:  Encounter for SOB [id number] SOB (shortness of breath) [id number] EXAM: PORTABLE CHEST 1 VIEW COMPARISON:  06/26/2014 FINDINGS: LEFT-sided pacemaker overlies normal cardiac silhouette. No effusion, infiltrate or pneumothorax. Versus arthroplasty LEFT shoulder. IMPRESSION: No acute cardiopulmonary process. Electronically Signed   By: Suzy Bouchard M.D.   On: 06/13/2022 14:34      Scheduled Meds:  atorvastatin  20 mg Oral Daily   lidocaine  1 patch Transdermal Q24H   predniSONE  10 mg Oral q morning   tamsulosin  0.4 mg Oral QPC supper   Continuous Infusions:  sodium chloride 100 mL/hr at 06/15/22 0617     LOS: 3 days     Phillips Climes, MD [facility] 06/15/2022, 12:19 PM   Available via Epic secure chat 7am-7pm After these hours, please refer to coverage provider listed on [email or web address]

## 2022-06-15 NOTE — Progress Notes (Signed)
SLP Cancellation Note  Patient Details Name: HAMED DEBELLA MRN: [id number] DOB: [birth year]   Cancelled treatment:       Reason Eval/Treat Not Completed: Other (comment) (Passed [facility] swallow screen. RN reports doing well with pO diet, thus no formalized SLP swallow eval warranted per protocol.)   Juan Quam Laurice 06/15/2022, 12:25 PM Shannon Kirkendall L. Tivis Ringer, MA CCC/SLP Clinical Specialist - [facility] Office number [phone]

## 2022-06-15 NOTE — Progress Notes (Signed)
Echocardiogram 2D Echocardiogram has been performed.  Oneal Deputy Nishika Parkhurst RDCS 06/15/2022, 3:26 PM

## 2022-06-15 NOTE — Progress Notes (Signed)
0040 Report given to Baxter Flattery at [facility]. Transportation via [facility]. Wife remains at bedside.   0125 pt transported via [facility]. All belongings taken by pt's wife.

## 2022-06-15 NOTE — Consult Note (Addendum)
Cardiology Consultation   Patient ID: Devin Mercado MRN: [id number]; DOB: 27-Jul-1950  Admit date: 05/23/2022 Date of Consult: 06/15/2022  PCP:  [name], MD   [facility] [place] [facility] Cardiologist:  Cristopher Peru, MD  Cardiology APP:  [name], NP (Inactive)       Patient Profile:   Devin Mercado is a 71 y.o. male with a hx of CHB, s/p PPM who is being seen 06/15/2022 for the evaluation of acute stroke to have Devin PPM interrogated at the request of Dr. Waldron Labs.  History of Present Illness:   Devin Mercado is well known to me with CHB dating back over 20 years. He has been stable. He has HTN. He was diagnosed with prostate CA and found to have bone mets. He has undergone therapy at [facility]. He developed gross hematuria and presented for eval and was then found to have an acute stroke. He is undergoing additional evaluation. He denies palpitations.    Past Medical History:  Diagnosis Date   Arthritis    OA AND PAIN LEFT HIP   CHB (complete heart block) ([place])    a. MDT dual chamber pacemaker   HTN (hypertension)    Hyperlipidemia    Presence of permanent cardiac pacemaker    2000   Prostate cancer ([place]) 09/2014    Past Surgical History:  Procedure Laterality Date   ADENOIDECTOMY AS A CHILD     COLONOSCOPY WITH PROPOFOL N/A 12/30/2017   Procedure: COLONOSCOPY WITH PROPOFOL;  Surgeon: Ladene Artist, MD;  Location: [facility];  Service: Endoscopy;  Laterality: N/A;   JOINT REPLACEMENT     Right hip replacement Dr. Alvan Dame 01-05-18   LYMPHADENECTOMY Bilateral 11/06/2014   Procedure: PELVIC LYMPHADENECTOMY;  Surgeon: [name], MD;  Location: [facility];  Service: Urology;  Laterality: Bilateral;   PACEMAKER INSERTION  2000; 2012   MDT dual chamber pacemaker implanted 2000 with gen change 2012   POLYPECTOMY  12/30/2017   Procedure: POLYPECTOMY;  Surgeon: Ladene Artist, MD;  Location: [facility];  Service: Endoscopy;;   PROSTATE BIOPSY  09/05/14    REVERSE SHOULDER ARTHROPLASTY Left 10/15/2021   Procedure: REVERSE SHOULDER ARTHROPLASTY;  Surgeon: Justice Britain, MD;  Location: [facility];  Service: Orthopedics;  Laterality: Left;   ROBOT ASSISTED LAPAROSCOPIC RADICAL PROSTATECTOMY N/A 11/06/2014   Procedure: ROBOTIC ASSISTED LAPAROSCOPIC RADICAL PROSTATECTOMY LEVEL 2;  Surgeon: [name], MD;  Location: [facility];  Service: Urology;  Laterality: N/A;   TOTAL HIP ARTHROPLASTY Left 07/03/2014   Procedure: LEFT TOTAL HIP ARTHROPLASTY ANTERIOR APPROACH;  Surgeon: [name], MD;  Location: [facility];  Service: Orthopedics;  Laterality: Left;   TOTAL HIP ARTHROPLASTY Right 01/05/2018   Procedure: RIGHT TOTAL HIP ARTHROPLASTY ANTERIOR APPROACH;  Surgeon: Paralee Cancel, MD;  Location: [facility];  Service: Orthopedics;  Laterality: Right;  70 mins   WISDOM TEETH EXTRACTIONS       Home Medications:  Prior to Admission medications   Medication Sig Start Date End Date Taking? Authorizing Provider  acetaminophen (TYLENOL) 325 MG tablet Take 650 mg by mouth every 6 (six) hours as needed for mild pain or headache.   Yes [provider]  amoxicillin (AMOXIL) 500 MG capsule Take 4 capsules by mouth 1 hour prior to dental appointment for pre-medication. Patient taking differently: Take 2,000 mg by mouth See admin instructions. Take 2,000 mg by mouth one hour prior to dental appointments for pre-medication 02/17/22  Yes   atorvastatin (LIPITOR) 20 MG tablet  Take 1 tablet by mouth once daily Patient taking differently: Take 20 mg by mouth daily. 07/29/21  Yes [name], MD  calcium carbonate (OS-CAL - DOSED IN MG OF ELEMENTAL CALCIUM) 1250 (500 Ca) MG tablet Take 1 tablet by mouth 2 (two) times a week.   Yes [provider]  denosumab (XGEVA) 120 MG/1.7ML SOLN injection Inject 120 mg into the skin every 2 (two) months.   Yes [provider]  docusate sodium (COLACE) 100 MG capsule Take 100 mg by mouth daily as needed for mild  constipation.   Yes [provider]  ferrous sulfate 325 (65 FE) MG tablet Take 325 mg by mouth daily with breakfast.   Yes [provider]  HYDROcodone-acetaminophen (NORCO/VICODIN) 5-325 MG tablet Take 1 tablet by mouth at bedtime as needed. 05/31/2022  Yes   leuprolide, 6 Month, (ELIGARD) 45 MG injection Inject 45 mg into the skin every 6 (six) months.   Yes [provider]  predniSONE (DELTASONE) 10 MG tablet Take 1 tablet (10 mg total) by mouth every morning. 05/01/22  Yes   prochlorperazine (COMPAZINE) 10 MG tablet Take 1 tablet (10 mg total) by mouth every 6 (six) hours as needed as needed for nausea or vomiting. 05/01/22  Yes   traMADol (ULTRAM) 50 MG tablet Take 1 tablet (50 mg total) by mouth every 8 (eight) hours as needed for Pain for up to 20 days 05/27/22  Yes   valsartan (DIOVAN) 160 MG tablet Take 1 tablet (160 mg total) by mouth daily. 10/10/21  Yes [name], MD    Inpatient Medications: Scheduled Meds:  atorvastatin  20 mg Oral Daily   lidocaine  1 patch Transdermal Q24H   predniSONE  10 mg Oral q morning   tamsulosin  0.4 mg Oral QPC supper   Continuous Infusions:  sodium chloride 100 mL/hr at 06/15/22 0617   PRN Meds: acetaminophen **OR** acetaminophen, LORazepam, morphine injection, oxyCODONE, senna-docusate, traMADol  Allergies:   No Known Allergies  Social History:   Social History   Socioeconomic History   Marital status: Married    Spouse name: Not on file   Number of children: 4   Years of education: Not on file   Highest education level: Not on file  Occupational History   Occupation: attorney  Tobacco Use   Smoking status: Never   Smokeless tobacco: Never  Vaping Use   Vaping Use: Never used  Substance and Sexual Activity   Alcohol use: Yes    Alcohol/week: 2.0 - 3.0 standard drinks of alcohol    Types: 2 - 3 Standard drinks or equivalent per week    Comment: 5 TO 7 DRINKS A WEEK stopped etoh use pending surgery     Drug use: No   Sexual activity: Yes  Other Topics Concern   Not on file  Social History Narrative   Married to Dr. [name] 1979. 4 kids (1 lost to auto accident-3 surviving), no grandkids. 1 daughter married in 2016 and works as Forensic psychologist in [place].       Retired Forensic psychologist       Hobbies: [place], vacation   Social Determinants of Radio broadcast assistant Strain: Not on file  Food Insecurity: No [place] (06/13/2022)   Hunger Vital Sign    Worried About Running Out of Food in the Last Year: Never true    Ran Out of Food in the Last Year: Never true  Transportation Needs: No Transportation Needs (06/13/2022)  PRAPARE - Hydrologist (Medical): No    Lack of Transportation (Non-Medical): No  Physical Activity: Not on file  Stress: Not on file  Social Connections: Not on file  Intimate Partner Violence: Not At Risk (06/13/2022)   Humiliation, Afraid, Rape, and Kick questionnaire    Fear of Current or Ex-Partner: No    Emotionally Abused: No    Physically Abused: No    Sexually Abused: No    Family History:    Family History  Problem Relation Age of Onset   Stroke Mother        multiple. 44.    Heart disease Mother        hx heart valve problem   Prostate cancer Father        38 of prostate cancer   Heart attack Neg Hx    Hypertension Neg Hx      ROS:  Please see the history of present illness.   All other ROS reviewed and negative.     Physical Exam/Data:   Vitals:   06/15/22 0005 06/15/22 0205 06/15/22 0322 06/15/22 0955  BP: 119/67 121/67 (!) 109/57 (!) 120/59  Pulse: 95 (!) 106 (!) 52 (!) 44  Resp: (!) 21 17 (!) 22 16  Temp: 99 F (37.2 C) (!) 100.4 F (38 C) 99.6 F (37.6 C) 98.3 F (36.8 C)  TempSrc: Oral Axillary Oral Oral  SpO2:  95% 97% 98%  Weight:      Height:        Intake/Output Summary (Last 24 hours) at 06/15/2022 1224 Last data filed at 06/14/2022 2100 Gross per 24 hour  Intake 630 ml  Output  --  Net 630 ml      06/11/2022   11:59 PM 05/26/2022    5:16 PM 05/19/2022    9:50 AM  Last 3 Weights  Weight (lbs) 227 lb 1.2 oz 225 lb 223 lb 9.6 oz  Weight (kg) 103 kg 102.059 kg 101.424 kg     Body mass index is 30.8 kg/m.  General:  Well nourished, well developed, in no acute distress HEENT: normal Neck: no JVD Vascular: No carotid bruits; Distal pulses 2+ bilaterally Cardiac:  normal S1, S2; RRR; no murmur  Lungs:  clear to auscultation bilaterally, no wheezing, rhonchi or rales  Abd: soft, nontender, no hepatomegaly  Ext: no edema Musculoskeletal:  No deformities, BUE and BLE strength normal and equal Skin: warm and dry  Neuro:  CNs 2-12 intact, no focal abnormalities noted Psych:  Normal affect   EKG:  The EKG was personally reviewed and demonstrates:  nsr Telemetry:  Telemetry was personally reviewed and demonstrates:  nsr  Relevant CV Studies: PM interrogation performed by me demonstrates 16 hours of atrial fib on 11/30.  Laboratory Data:  High Sensitivity Troponin:  No results for input(s): "TROPONINIHS" in the last 720 hours.   Chemistry Recent Labs  Lab 06/13/22 1901 06/14/22 0254 06/15/22 0814  NA 125* 132* 130*  K 4.3 4.0 3.5  CL 98 103 102  CO2 18* 20* 18*  GLUCOSE 142* 110* 109*  BUN 30* 28* 28*  CREATININE 1.17 1.12 1.43*  CALCIUM 6.9* 6.9* 6.4*  MG 2.1  --   --   GFRNONAA >60 >60 52*  ANIONGAP '9 9 10    '$ Recent Labs  Lab 05/29/2022 1720  PROT 6.4*  ALBUMIN 3.6  AST 18  ALT 11  ALKPHOS 70  BILITOT 0.6   Lipids No results for input(s): "  CHOL", "TRIG", "HDL", "LABVLDL", "LDLCALC", "CHOLHDL" in the last 168 hours.  Hematology Recent Labs  Lab 06/14/22 0844 06/14/22 2302 06/15/22 0814  WBC 2.4* 5.4 3.9*  RBC 2.61* 2.89* 2.79*  HGB 7.3* 8.2* 7.7*  HCT 22.5* 23.8* 23.0*  MCV 86.2 82.4 82.4  MCH 28.0 28.4 27.6  MCHC 32.4 34.5 33.5  RDW 17.9* 17.2* 17.4*  PLT 43* 41* 33*   Thyroid  Recent Labs  Lab 05/25/2022 1932  TSH 19.527*   FREET4 0.70    BNPNo results for input(s): "BNP", "PROBNP" in the last 168 hours.  DDimer No results for input(s): "DDIMER" in the last 168 hours.   Radiology/Studies:  CT ANGIO HEAD NECK W WO CM W PERF (CODE STROKE)  Result Date: 06/14/2022 CLINICAL DATA:  Neuro deficit, acute, stroke suspected. Right-sided weakness. EXAM: CT ANGIOGRAPHY HEAD AND NECK TECHNIQUE: Multidetector CT imaging of the head and neck was performed using the standard protocol during bolus administration of intravenous contrast. Multiplanar CT image reconstructions and MIPs were obtained to evaluate the vascular anatomy. Carotid stenosis measurements (when applicable) are obtained utilizing NASCET criteria, using the distal internal carotid diameter as the denominator. RADIATION DOSE REDUCTION: This exam was performed according to the departmental dose-optimization program which includes automated exposure control, adjustment of the mA and/or kV according to patient size and/or use of iterative reconstruction technique. CONTRAST:  165m OMNIPAQUE IOHEXOL 350 MG/ML SOLN COMPARISON:  CT head without contrast 06/14/2022 at 2:50 p.m. FINDINGS: CTA NECK FINDINGS Aortic arch: Minimal atherosclerotic changes are present the distal aorta. Three vessel arch configuration is present. No significant stenosis is present at the great vessel origins. Right carotid system: Right common carotid artery is within normal limits. Minimal calcifications are present proximal right ICA without significant stenosis. Mild tortuosity is present in the distal cervical right ICA without significant stenosis. Left carotid system: The left common carotid artery is within normal limits. Bifurcation is unremarkable. Mild tortuosity is present cervical left ICA without significant stenosis. Vertebral arteries: The left vertebral artery is the dominant vessel. Both vertebral arteries originate from the subclavian arteries without significant stenosis. No significant  stenosis is present in either vertebral artery in the neck. Skeleton: Multiple sclerotic lesions are present throughout the cervical and upper thoracic spine. Diffuse vertebral body involvement is present at T2 and T3. Asymmetric right-sided involvement is present at T1 and T2. No pathologic fractures are present. Sclerotic lesions are present within ribs bilaterally. Other neck: Soft tissues the neck are otherwise unremarkable. Salivary glands are within normal limits. Thyroid is normal. No significant adenopathy is present. No focal mucosal or submucosal lesions are present. Upper chest: The lung apices are clear. Review of the MIP images confirms the above findings CTA HEAD FINDINGS Anterior circulation: Minimal atherosclerotic changes are present within the cavernous internal carotid arteries bilaterally. The A1 and M1 segments are normal. The anterior communicating artery is patent. MCA bifurcations are within normal limits bilaterally. The ACA and MCA branch vessels are within normal limits bilaterally. Posterior circulation: The left vertebral artery is dominant vessel. PICA origins are visualized and normal. Vertebrobasilar junction and basilar artery is normal. Both posterior cerebral arteries originate from basilar tip. The PCA branch vessels are normal. Venous sinuses: The dural sinuses are patent. The straight sinus and deep cerebral veins are intact. Cortical veins are within normal limits. No significant vascular malformation is evident. Anatomic variants: None Review of the MIP images confirms the above findings IMPRESSION: 1. No emergent large vessel occlusion. 2. Minimal atherosclerotic changes  within the proximal right ICA and cavernous internal carotid arteries bilaterally without significant stenosis. 3. Multiple sclerotic lesions throughout the cervical and upper thoracic spine and ribs bilaterally consistent with metastatic disease. No pathologic fractures are present. Electronically Signed    By: San Morelle M.D.   On: 06/14/2022 18:26   CT HEAD WO CONTRAST (5MM)  Result Date: 06/14/2022 CLINICAL DATA:  Abnormal CT of the head. Delayed images were obtained after CTA head to evaluate for possible metastatic disease. EXAM: CT HEAD WITHOUT CONTRAST TECHNIQUE: Contiguous axial images were obtained from the base of the skull through the vertex without intravenous contrast. RADIATION DOSE REDUCTION: This exam was performed according to the departmental dose-optimization program which includes automated exposure control, adjustment of the mA and/or kV according to patient size and/or use of iterative reconstruction technique. COMPARISON:  CT head without contrast 06/14/2022 at 2:50 p.m. FINDINGS: Brain: Delayed images demonstrate no pathologic enhancement. Infarcts involving the posterior right parietal lobe and left occipital lobe are again seen. Central left cerebellar infarct is better visualized. Mild atrophy and white matter changes are stable. No significant interval change is present. Vascular: Normal intravascular enhancement is present. Skull: Calvarium is intact. No focal lytic or blastic lesions are present. No significant extracranial soft tissue lesion is present. Sinuses/Orbits: The paranasal sinuses and mastoid air cells are clear. The globes and orbits are within normal limits. IMPRESSION: 1. No pathologic enhancement to suggest metastatic disease. 2. Stable infarcts involving the posterior right parietal lobe and left occipital lobe. 3. Central left cerebellar infarct is better visualized. 4. Stable atrophy and white matter disease. This likely reflects the sequela of chronic microvascular ischemia. Electronically Signed   By: San Morelle M.D.   On: 06/14/2022 18:14   CT HEAD CODE STROKE WO CONTRAST  Result Date: 06/14/2022 CLINICAL DATA:  Code stroke. Neuro deficit, acute, stroke suspected. Right-sided weakness. EXAM: CT HEAD WITHOUT CONTRAST TECHNIQUE: Contiguous  axial images were obtained from the base of the skull through the vertex without intravenous contrast. RADIATION DOSE REDUCTION: This exam was performed according to the departmental dose-optimization program which includes automated exposure control, adjustment of the mA and/or kV according to patient size and/or use of iterative reconstruction technique. COMPARISON:  . FINDINGS: Brain: Focal hypoattenuation is present in the posterior right parietal lobe measuring up to 2.5 cm. Focal cortical hypoattenuation is also present in the posterior left occipital lobe on image 12 series 3 and image 41 of series 7. No other acute or focal cortical abnormalities are present. Deep brain nuclei are within normal limits. Insular ribbon is normal bilaterally. Cavum septum pellucidum noted. Ventricles are otherwise within normal limits. No significant extraaxial fluid collection is present. The brainstem and cerebellum are within normal limits. Vascular: No hyperdense vessel or unexpected calcification. Skull: Calvarium is intact. No focal lytic or blastic lesions are present. No significant extracranial soft tissue lesion is present. Sinuses/Orbits: The paranasal sinuses and mastoid air cells are clear. The globes and orbits are within normal limits. ASPECTS [facility] Stroke Program Early CT Score) - Ganglionic level infarction (caudate, lentiform nuclei, internal capsule, insula, M1-M3 cortex): 6/7 - Supraganglionic infarction (M4-M6 cortex): 3/3 Total score (0-10 with 10 being normal): 9/10 IMPRESSION: 1. Focal hypoattenuation in the posterior right parietal lobe measuring up to 2.5 cm. This is concerning for acute or subacute infarct. 2. Focal cortical hypoattenuation in the posterior left occipital lobe is also concerning for acute or subacute infarct. 3. No other acute or focal cortical abnormalities. 4. Aspects is  9/10. The above was relayed via text pager to Dr. Lyn Records on 06/14/2022 at 15:08. Page Electronically Signed    By: San Morelle M.D.   On: 06/14/2022 15:09   DG CHEST PORT 1 VIEW  Result Date: 06/13/2022 CLINICAL DATA:  Encounter for SOB [id number] SOB (shortness of breath) [id number] EXAM: PORTABLE CHEST 1 VIEW COMPARISON:  06/26/2014 FINDINGS: LEFT-sided pacemaker overlies normal cardiac silhouette. No effusion, infiltrate or pneumothorax. Versus arthroplasty LEFT shoulder. IMPRESSION: No acute cardiopulmonary process. Electronically Signed   By: Suzy Bouchard M.D.   On: 06/13/2022 14:34     Assessment and Plan:   PAF - PM interrogation demonstrates a 16 hour episode of atrial fib on 11/30. This is the cause of Devin stroke. An [place] is indicated, though I'll defer Devin ability to receive systemic anti-coagulation to the urology team and neurology. Specifically does Devin prostate CA with hemauria and Devin recent stroke with the potential for hemorrhagic transformation prevent the use of an [place]. If he cannot be anti-coagulated I would start amiodarone 200 mg bid. CHB - he is asymptomatic, as Devin PPM is working normally. HTN - Devin bp is well controlled.  PM interrogation - Devin device is working normally. He has about 33 months of battery longevity.  Fever - I discussed with Devin Mercado. He has had intermittent fevers for about a month. Consider TEE.  CHA2DS2-VASc Score =   4 {  For questions or updates, please contact [place] Please consult [email or web address] for contact info under   I discussed all of the above with the patient's Mercado. [name].  Signed, Cristopher Peru, MD  06/15/2022 12:24 PM

## 2022-06-15 NOTE — Evaluation (Signed)
Physical Therapy Evaluation Patient Details Name: Devin Mercado MRN: [id number] DOB: 1951/03/17 Today's Date: 06/15/2022  History of Present Illness  This is a 71 year old male presents with hematuria. 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 PHMx: prostate cancer with bone mets, anemia, pancytopenia.   Clinical Impression  Pt admitted with above diagnosis. PTA pt lived at home with wife, independent. Pt currently with functional limitations due to the deficits listed below (see PT Problem List). On eval, he required mod assist bed mobility, and mod assist transfers with RW. Pt limited by fatigue, having just completed OT eval prior to PT arrival. Deficits noted in strength, balance, and activity tolerance. Pt will benefit from skilled PT to increase their independence and safety with mobility to allow discharge to the venue listed below.          Recommendations for follow up therapy are one component of a multi-disciplinary discharge planning process, led by the attending physician.  Recommendations may be updated based on patient status, additional functional criteria and insurance authorization.  Follow Up Recommendations Acute inpatient rehab (3hours/day)      Assistance Recommended at Discharge Frequent or constant Supervision/Assistance  Patient can return home with the following  A lot of help with walking and/or transfers;A lot of help with bathing/dressing/bathroom;Assistance with cooking/housework;Assist for transportation;Help with stairs or ramp for entrance    Equipment Recommendations None recommended by PT  Recommendations for Other Services  Rehab consult    Functional Status Assessment Patient has had a recent decline in their functional status and demonstrates the ability to make significant improvements in function in a reasonable and predictable amount  of time.     Precautions / Restrictions Precautions Precautions: Fall Precaution Comments: having diarrhea on 12/3 Restrictions Weight Bearing Restrictions: No      Mobility  Bed Mobility Overal bed mobility: Needs Assistance Bed Mobility: Supine to Sit     Supine to sit: Mod assist          Transfers Overall transfer level: Needs assistance Equipment used: Rolling walker (2 wheels) Transfers: Sit to/from Stand Sit to Stand: Mod assist           General transfer comment: cues for sequencing, increased time, assist to power up    Ambulation/Gait               General Gait Details: limited to pregait activities due to fatigue and dizziness. Marching in place with RW support, min assist. 2/4 DOE  Financial trader Rankin (Stroke Patients Only) Modified Rankin (Stroke Patients Only) Pre-Morbid Rankin Score: No symptoms Modified Rankin: Moderately severe disability     Balance Overall balance assessment: Needs assistance Sitting-balance support: Feet supported, No upper extremity supported Sitting balance-Leahy Scale: Fair     Standing balance support: Bilateral upper extremity supported, During functional activity, Reliant on assistive device for balance Standing balance-Leahy Scale: Poor                               Pertinent Vitals/Pain Pain Assessment Pain Assessment: Faces Faces Pain Scale: No hurt    Home Living Family/patient expects to be discharged to:: Private residence Living Arrangements: Spouse/significant other Available Help at Discharge: Family;Available PRN/intermittently Type of Home: House Home Access: Stairs  to enter Entrance Stairs-Rails: None Entrance Stairs-Number of Steps: 2 (front); several with rail (at back)--normal entry for them   Home Layout: Two level;Laundry or work area in basement;Able to live on main level with bedroom/bathroom Home Equipment: Chartered certified accountant (2 wheels) Additional Comments: retired Engineer, materials    Prior Function Prior Level of Function : Independent/Modified Independent                     Hand Dominance   Dominant Hand: Right    Extremity/Trunk Assessment   Upper Extremity Assessment Upper Extremity Assessment: Defer to OT evaluation RUE Deficits / Details: ataxia, dysmetria, tingling in fingers (since LUE arm shoulder in April 2023); overall 3+/5 RUE Coordination: decreased fine motor;decreased gross motor    Lower Extremity Assessment Lower Extremity Assessment: Generalized weakness (LE strength appears symmetrical but overall weak)    Cervical / Trunk Assessment Cervical / Trunk Assessment: Normal  Communication   Communication: No difficulties  Cognition Arousal/Alertness: Awake/alert Behavior During Therapy: Flat affect Overall Cognitive Status: Impaired/Different from baseline Area of Impairment: Following commands, Problem solving, Awareness                       Following Commands: Follows one step commands with increased time   Awareness: Emergent Problem Solving: Slow processing, Difficulty sequencing          General Comments General comments (skin integrity, edema, etc.): Pt recevied 2 units PRBC at [facility]. Hgb is now trending down again. 8.2 (12/2 2300) to 7.7 (12/3 0814).    Exercises     Assessment/Plan    PT Assessment Patient needs continued PT services  PT Problem List Decreased strength;Decreased balance;Decreased cognition;Decreased mobility;Decreased knowledge of use of DME;Decreased activity tolerance       PT Treatment Interventions DME instruction;Functional mobility training;Balance training;Patient/family education;Gait training;Therapeutic activities;Stair training;Therapeutic exercise;Cognitive remediation    PT Goals (Current goals can be found in the Care Plan section)  Acute Rehab PT Goals Patient Stated Goal: home PT Goal Formulation: With  patient/family Time For Goal Achievement: 06/29/22 Potential to Achieve Goals: Good    Frequency Min 4X/week     Co-evaluation               AM-PAC PT "6 Clicks" Mobility  Outcome Measure Help needed turning from your back to your side while in a flat bed without using bedrails?: A Little Help needed moving from lying on your back to sitting on the side of a flat bed without using bedrails?: A Lot Help needed moving to and from a bed to a chair (including a wheelchair)?: A Little Help needed standing up from a chair using your arms (e.g., wheelchair or bedside chair)?: A Lot Help needed to walk in hospital room?: A Lot Help needed climbing 3-5 steps with a railing? : Total 6 Click Score: 13    End of Session Equipment Utilized During Treatment: Gait belt Activity Tolerance: Patient limited by fatigue Patient left: in chair;with call bell/phone within reach;with chair alarm set Nurse Communication: Mobility status PT Visit Diagnosis: Other abnormalities of gait and mobility (R26.89);Muscle weakness (generalized) (M62.81)    Time: 9622-2979 PT Time Calculation (min) (ACUTE ONLY): 21 min   Charges:   PT Evaluation $PT Eval Moderate Complexity: 1 Mod          Lorrin Goodell, PT  Office # [phone] Pager [phone]   Lorriane Shire 06/15/2022, 11:34 AM

## 2022-06-15 NOTE — Progress Notes (Signed)
Inpatient Rehab Admissions Coordinator Note:   Per therapy patient was screened for CIR candidacy by Proctor Carriker Danford Bad, CCC-SLP. At this time, pt appears to be a potential candidate for CIR. I will place an order for rehab consult for full assessment, per our protocol.  Please contact me any with questions.Gayland Curry, [place], [place] Admissions Coordinator [phone] 06/15/22 2:10 PM

## 2022-06-15 NOTE — Progress Notes (Signed)
The patient is urinating w/o difficulty and w/o hematuria.  PVR-0

## 2022-06-15 NOTE — Progress Notes (Addendum)
STROKE TEAM PROGRESS NOTE   INTERVAL HISTORY His wife is at the bedside. Admitted October 2023 to [facility] for acute urinary hematuria, urinary retention. Rcvd 2 units PRBC and 2 units Platelets. Urology office visit 12/1 with gross hematuria and difficulty voiding, hematuria catheter was placed. Patient came to [facility] ED, started on CBI, admitted to [facility].  Transferred to [facility] for stroke work-up after acute onset of RUE ataxia and increased drowsiness.  CT showed L occipital and R parietal infarcts. Will repeat CT today.   Patient also reports "off and on fevers" x 1 month and recent COVID infection with Paxlovid.   Vitals:   06/15/22 0005 06/15/22 0205 06/15/22 0322 06/15/22 0955  BP: 119/67 121/67 (!) 109/57 (!) 120/59  Pulse: 95 (!) 106 (!) 52 (!) 44  Resp: (!) 21 17 (!) 22 16  Temp: 99 F (37.2 C) (!) 100.4 F (38 C) 99.6 F (37.6 C) 98.3 F (36.8 C)  TempSrc: Oral Axillary Oral Oral  SpO2:  95% 97% 98%  Weight:      Height:       CBC:  Recent Labs  Lab 06/13/22 1901 06/14/22 0709 06/14/22 0844 06/14/22 2302 06/15/22 0814  WBC 3.6*  --  2.4* 5.4 3.9*  NEUTROABS 1.9  --  1.4*  --   --   HGB 8.1*   < > 7.3* 8.2* 7.7*  HCT 24.6*   < > 22.5* 23.8* 23.0*  MCV 85.4  --  86.2 82.4 82.4  PLT 80*  --  43* 41* 33*   < > = values in this interval not displayed.   Basic Metabolic Panel:  Recent Labs  Lab 06/13/22 1901 06/14/22 0254 06/15/22 0814  NA 125* 132* 130*  K 4.3 4.0 3.5  CL 98 103 102  CO2 18* 20* 18*  GLUCOSE 142* 110* 109*  BUN 30* 28* 28*  CREATININE 1.17 1.12 1.43*  CALCIUM 6.9* 6.9* 6.4*  MG 2.1  --   --    Lipid Panel: No results for input(s): "CHOL", "TRIG", "HDL", "CHOLHDL", "VLDL", "LDLCALC" in the last 168 hours. HgbA1c: No results for input(s): "HGBA1C" in the last 168 hours. Urine Drug Screen: No results for input(s): "LABOPIA", "COCAINSCRNUR", "LABBENZ", "AMPHETMU", "THCU", "LABBARB" in the last 168 hours.  Alcohol Level No results  for input(s): "ETH" in the last 168 hours.  IMAGING past 24 hours CT ANGIO HEAD NECK W WO CM W PERF (CODE STROKE)  Result Date: 06/14/2022 CLINICAL DATA:  Neuro deficit, acute, stroke suspected. Right-sided weakness. EXAM: CT ANGIOGRAPHY HEAD AND NECK TECHNIQUE: Multidetector CT imaging of the head and neck was performed using the standard protocol during bolus administration of intravenous contrast. Multiplanar CT image reconstructions and MIPs were obtained to evaluate the vascular anatomy. Carotid stenosis measurements (when applicable) are obtained utilizing NASCET criteria, using the distal internal carotid diameter as the denominator. RADIATION DOSE REDUCTION: This exam was performed according to the departmental dose-optimization program which includes automated exposure control, adjustment of the mA and/or kV according to patient size and/or use of iterative reconstruction technique. CONTRAST:  167m OMNIPAQUE IOHEXOL 350 MG/ML SOLN COMPARISON:  CT head without contrast 06/14/2022 at 2:50 p.m. FINDINGS: CTA NECK FINDINGS Aortic arch: Minimal atherosclerotic changes are present the distal aorta. Three vessel arch configuration is present. No significant stenosis is present at the great vessel origins. Right carotid system: Right common carotid artery is within normal limits. Minimal calcifications are present proximal right ICA without significant stenosis. Mild tortuosity is present  in the distal cervical right ICA without significant stenosis. Left carotid system: The left common carotid artery is within normal limits. Bifurcation is unremarkable. Mild tortuosity is present cervical left ICA without significant stenosis. Vertebral arteries: The left vertebral artery is the dominant vessel. Both vertebral arteries originate from the subclavian arteries without significant stenosis. No significant stenosis is present in either vertebral artery in the neck. Skeleton: Multiple sclerotic lesions are present  throughout the cervical and upper thoracic spine. Diffuse vertebral body involvement is present at T2 and T3. Asymmetric right-sided involvement is present at T1 and T2. No pathologic fractures are present. Sclerotic lesions are present within ribs bilaterally. Other neck: Soft tissues the neck are otherwise unremarkable. Salivary glands are within normal limits. Thyroid is normal. No significant adenopathy is present. No focal mucosal or submucosal lesions are present. Upper chest: The lung apices are clear. Review of the MIP images confirms the above findings CTA HEAD FINDINGS Anterior circulation: Minimal atherosclerotic changes are present within the cavernous internal carotid arteries bilaterally. The A1 and M1 segments are normal. The anterior communicating artery is patent. MCA bifurcations are within normal limits bilaterally. The ACA and MCA branch vessels are within normal limits bilaterally. Posterior circulation: The left vertebral artery is dominant vessel. PICA origins are visualized and normal. Vertebrobasilar junction and basilar artery is normal. Both posterior cerebral arteries originate from basilar tip. The PCA branch vessels are normal. Venous sinuses: The dural sinuses are patent. The straight sinus and deep cerebral veins are intact. Cortical veins are within normal limits. No significant vascular malformation is evident. Anatomic variants: None Review of the MIP images confirms the above findings IMPRESSION: 1. No emergent large vessel occlusion. 2. Minimal atherosclerotic changes within the proximal right ICA and cavernous internal carotid arteries bilaterally without significant stenosis. 3. Multiple sclerotic lesions throughout the cervical and upper thoracic spine and ribs bilaterally consistent with metastatic disease. No pathologic fractures are present. Electronically Signed   By: San Morelle M.D.   On: 06/14/2022 18:26   CT HEAD WO CONTRAST (5MM)  Result Date:  06/14/2022 CLINICAL DATA:  Abnormal CT of the head. Delayed images were obtained after CTA head to evaluate for possible metastatic disease. EXAM: CT HEAD WITHOUT CONTRAST TECHNIQUE: Contiguous axial images were obtained from the base of the skull through the vertex without intravenous contrast. RADIATION DOSE REDUCTION: This exam was performed according to the departmental dose-optimization program which includes automated exposure control, adjustment of the mA and/or kV according to patient size and/or use of iterative reconstruction technique. COMPARISON:  CT head without contrast 06/14/2022 at 2:50 p.m. FINDINGS: Brain: Delayed images demonstrate no pathologic enhancement. Infarcts involving the posterior right parietal lobe and left occipital lobe are again seen. Central left cerebellar infarct is better visualized. Mild atrophy and white matter changes are stable. No significant interval change is present. Vascular: Normal intravascular enhancement is present. Skull: Calvarium is intact. No focal lytic or blastic lesions are present. No significant extracranial soft tissue lesion is present. Sinuses/Orbits: The paranasal sinuses and mastoid air cells are clear. The globes and orbits are within normal limits. IMPRESSION: 1. No pathologic enhancement to suggest metastatic disease. 2. Stable infarcts involving the posterior right parietal lobe and left occipital lobe. 3. Central left cerebellar infarct is better visualized. 4. Stable atrophy and white matter disease. This likely reflects the sequela of chronic microvascular ischemia. Electronically Signed   By: San Morelle M.D.   On: 06/14/2022 18:14   CT HEAD CODE STROKE WO CONTRAST  Result Date: 06/14/2022 CLINICAL DATA:  Code stroke. Neuro deficit, acute, stroke suspected. Right-sided weakness. EXAM: CT HEAD WITHOUT CONTRAST TECHNIQUE: Contiguous axial images were obtained from the base of the skull through the vertex without intravenous contrast.  RADIATION DOSE REDUCTION: This exam was performed according to the departmental dose-optimization program which includes automated exposure control, adjustment of the mA and/or kV according to patient size and/or use of iterative reconstruction technique. COMPARISON:  . FINDINGS: Brain: Focal hypoattenuation is present in the posterior right parietal lobe measuring up to 2.5 cm. Focal cortical hypoattenuation is also present in the posterior left occipital lobe on image 12 series 3 and image 41 of series 7. No other acute or focal cortical abnormalities are present. Deep brain nuclei are within normal limits. Insular ribbon is normal bilaterally. Cavum septum pellucidum noted. Ventricles are otherwise within normal limits. No significant extraaxial fluid collection is present. The brainstem and cerebellum are within normal limits. Vascular: No hyperdense vessel or unexpected calcification. Skull: Calvarium is intact. No focal lytic or blastic lesions are present. No significant extracranial soft tissue lesion is present. Sinuses/Orbits: The paranasal sinuses and mastoid air cells are clear. The globes and orbits are within normal limits. ASPECTS [facility] Stroke Program Early CT Score) - Ganglionic level infarction (caudate, lentiform nuclei, internal capsule, insula, M1-M3 cortex): 6/7 - Supraganglionic infarction (M4-M6 cortex): 3/3 Total score (0-10 with 10 being normal): 9/10 IMPRESSION: 1. Focal hypoattenuation in the posterior right parietal lobe measuring up to 2.5 cm. This is concerning for acute or subacute infarct. 2. Focal cortical hypoattenuation in the posterior left occipital lobe is also concerning for acute or subacute infarct. 3. No other acute or focal cortical abnormalities. 4. Aspects is 9/10. The above was relayed via text pager to Dr. Lyn Records on 06/14/2022 at 15:08. Page Electronically Signed   By: San Morelle M.D.   On: 06/14/2022 15:09    PHYSICAL EXAM  Temp:  [98.3 F (36.8  C)-100.5 F (38.1 C)] 98.3 F (36.8 C) (12/03 0955) Pulse Rate:  [44-106] 44 (12/03 0955) Resp:  [16-22] 16 (12/03 0955) BP: (100-149)/(44-76) 120/59 (12/03 0955) SpO2:  [95 %-98 %] 98 % (12/03 0955)  General - Well nourished, well developed, in no apparent distress.Sitting up in chair.   Mental Status -  Level of arousal and orientation to time, place, and person were intact. Language including expression, naming, repetition, comprehension was assessed, slight dysarthria.  Fund of Knowledge was assessed and was intact.  Cranial Nerves II - XII - II - left upper visual cut. III, IV, VI - Extraocular movements intact. V - Facial sensation intact bilaterally. VII - Facial movement intact bilaterally. VIII - Hearing & vestibular intact bilaterally. X - Palate elevates symmetrically. XI - Chin turning & shoulder shrug intact unsymmetrical. Patient had recent shoulder surgery and said this is his baseline. XII - Tongue protrusion intact.  Motor Strength - The patient's strength was normal in all extremities. Rue exhibited slight pronotor drift, decreased grip.  He has left shoulder replacement in the past and this may be limiting range of motion. Motor Tone - Muscle tone was assessed at the neck and appendages and was normal.  Sensory - Light touch, temperature/pinprick were assessed and were symmetrical.    Coordination - Ataxia/Decreased coordination present in RUE. Patient able to hold cup bit had problems setting cup back down on table and was unable to properly do FNT on RUE.   Gait and Station - deferred.  ASSESSMENT/PLAN Mr. Devin Ishaq  Mercado is a 71 y.o. male with history of hypertension, hyperlipidemia,complete heart block, s/p non-MRI-compatible pacemaker, metastatic prostate cancer with bone mets (treated at [facility]), presenting with acute right arm ataxia while being treated at [facility] for gross hematuria. CT showed L occipital and R parietal infarct. Transferred to [facility]  for stroke work-up. Repeat CT ordered today.   Stroke Etiology:  pending  CT head: Focal hypoattenuation in the posterior right parietal lobe measuring up to 2.5 cm. This is concerning for acute or subacute infarct. Focal cortical hypoattenuation in the posterior left occipital lobe is also concerning for acute or subacute infarct. No other acute or focal cortical abnormalities. Aspects is 9/10. Stable atrophy and white matter disease. CTA head & neck: No emergent large vessel occlusion. Minimal atherosclerotic changes within the proximal right ICA and cavernous internal carotid arteries bilaterally without significant stenosis. Multiple sclerotic lesions throughout the cervical and upper thoracic spine and ribs bilaterally consistent with metastatic disease.  MRI  unable to obtain d/t non-MRI-compatible pacemaker 2D Echo pending TEE pending. LDL 98 HgbA1c 5.6 VTE prophylaxis - SCDs    Diet   Diet Heart Room service appropriate? Yes; Fluid consistency: Thin   No antithrombotic prior to admission Therapy recommendations:  pending Disposition:  pending  Hypertension Home meds:  valsartan Permissive hypertension (OK if < 220/120) but gradually normalize in 5-7 days Long-term BP goal normotensive  Hyperlipidemia Home meds:  lipitor '20mg'$ , resumed in hospital LDL 98, goal < 70 Continue statin at discharge  Other Stroke Risk Factors Advanced Age >/= 48  Obesity, Body mass index is 30.8 kg/m., BMI >/= 30 associated with increased stroke risk, recommend weight loss, diet and exercise as appropriate  Family hx stroke (mother)  Other Active Problems (per Primary Team) Prostate Cancer Leukopenia  (2.4->5.4->3.9) Urinary retention, hematuria  Urology consulted Anemia (7.2->7.3->8.2->7.7)   Hospital day # 3  E. Cherylann Banas, DNP, ACAGNP-BC [facility] Pager: [phone]  ATTENDING ATTESTATION:  71 year old gentleman admitted to [facility] for hematuria and had  acute change with ataxia found to have multifocal posterior circulation strokes and transferred to [facility] for further workup.  His wife is a primary care physician with [facility] health Outpatient.  Exam as above.  He is worried about his ongoing fevers we discussed ID consult.  His pacemaker will be interrogated.  He will need a T EE on Monday to rule out cardiac thrombus or vegetations.  This plan was also discussed with patient's hospitalist Dr. Waldron Labs.  Unable to give antiplatelet therapy due to hematuria and anemia.  Dr. Reeves Forth evaluated pt independently, reviewed imaging, chart, labs. Discussed and formulated plan with the Resident/APP. Changes were made to the note where appropriate. Please see APP/resident note above for details.    Altha Sweitzer,MD   To contact Stroke Continuity provider, please refer to [email or web address] After hours, contact General Neurology

## 2022-06-15 NOTE — Progress Notes (Addendum)
TRH night cross cover note:   I was notified by RN of the patient's arrival from was able [facility] he was admitted on 06/13/2022, urgently for acute urinary retention, gross hematuria, acute blood loss anemia before being transferred to [facility] for stroke work-up. Had received transfusion of 2 units prbc and 2 units platelets at [facility], with reported reassuring post-transfusion cbc.   RN conveys that patient c/o of some anxiety upon arrival at [facility]. Will order single dose of prn ativan.    Per patient's RN, tele showing v paced rhythm with some pauses. Feels less anxious now. Per my chart review: pt w/ h/o complete heart block, s/p pacemaker. Checking updated EKG.    Babs Bertin, DO Hospitalist

## 2022-06-15 NOTE — Progress Notes (Signed)
Patient arrived in the unit at 1:50 am from [facility], A&Ox 3, initiated telemetry monitor, will notify oncall Neurologist, and continue to monitor closely.

## 2022-06-15 NOTE — Evaluation (Signed)
Occupational Therapy Evaluation Patient Details Name: Devin Mercado MRN: [id number] DOB: 1951/04/07 Today's Date: 06/15/2022   History of Present Illness This is a 71 year old male presents with hematuria. 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 PHMx: prostate cancer with bone mets, anemia, pancytopenia.   Clinical Impression   This 71 yo male admitted with above presents to acute OT with PLOF of being totally independent with all basic ADLs, IADLs, and driving. He currently has decreased use of RUE, delayed processing of information, visual disturbance, and decreased balance all affecting his safety and independence with ADLs and IADLs. He will continue to benefit from acute OT with follow up on AIR to return home at hopeful Mod I to Independent level.      Recommendations for follow up therapy are one component of a multi-disciplinary discharge planning process, led by the attending physician.  Recommendations may be updated based on patient status, additional functional criteria and insurance authorization.   Follow Up Recommendations  Acute inpatient rehab (3hours/day)     Assistance Recommended at Discharge Frequent or constant Supervision/Assistance  Patient can return home with the following A lot of help with walking and/or transfers;A lot of help with bathing/dressing/bathroom;Assistance with cooking/housework;Assist for transportation;Help with stairs or ramp for entrance;Direct supervision/assist for financial management;Direct supervision/assist for medications management    Functional Status Assessment  Patient has had a recent decline in their functional status and demonstrates the ability to make significant improvements in function in a reasonable and predictable amount of time.  Equipment Recommendations  Other (comment) (TBD next venue)     Recommendations for Other Services Rehab consult     Precautions / Restrictions Precautions Precautions: Fall Precaution Comments: having diarrhea on 12/3 Restrictions Weight Bearing Restrictions: No      Mobility Bed Mobility Overal bed mobility: Needs Assistance Bed Mobility: Supine to Sit     Supine to sit: Mod assist     General bed mobility comments: A to move legs, roll and come up with trunk as well as to scoot to EOB to get feet on floor    Transfers Overall transfer level: Needs assistance Equipment used: 1 person hand held assist Transfers: Sit to/from Stand, Bed to chair/wheelchair/BSC Sit to Stand: Mod assist     Step pivot transfers: Min assist            Balance Overall balance assessment: Needs assistance Sitting-balance support: Feet supported, No upper extremity supported Sitting balance-Leahy Scale: Fair     Standing balance support: Bilateral upper extremity supported Standing balance-Leahy Scale: Poor                             ADL either performed or assessed with clinical judgement   ADL Overall ADL's : Needs assistance/impaired Eating/Feeding: Supervision/ safety;Set up;Sitting   Grooming: Set up;Supervision/safety;Sitting   Upper Body Bathing: Set up;Supervision/ safety;Sitting   Lower Body Bathing: Moderate assistance Lower Body Bathing Details (indicate cue type and reason): Mod A sit<>stand Upper Body Dressing : Moderate assistance;Sitting   Lower Body Dressing: Maximal assistance Lower Body Dressing Details (indicate cue type and reason): Mod A sit<>stand Toilet Transfer: Minimal assistance;Stand-pivot;BSC/3in1   Toileting- Clothing Manipulation and Hygiene: Total assistance Toileting - Clothing Manipulation Details (indicate cue type and reason): Mod A sit<>stand  Vision Baseline Vision/History: 1 Wears glasses Ability to See in Adequate Light: 0 Adequate Patient Visual Report:  (says it  is different, but can't exactly explain how) Vision Assessment?: Yes;Vision impaired- to be further tested in functional context Eye Alignment: Within Functional Limits Ocular Range of Motion: Within Functional Limits Alignment/Gaze Preference:  (occassionally tilts his head) Tracking/Visual Pursuits:  (slowness of tracking) Convergence: Within functional limits Visual Fields: Other (comment) (appears of have a lower field cut in right eye lateral and nasal AND lower field cut left eye in lateral only. There are also times you place a pen at midline and he says he cannot see it at all.)            Pertinent Vitals/Pain Pain Assessment Pain Assessment: Faces Faces Pain Scale: Hurts little more Pain Location: gut Pain Descriptors / Indicators: Cramping Pain Intervention(s): Limited activity within patient's tolerance, Repositioned (got pt on BSC)     Hand Dominance Right   Extremity/Trunk Assessment Upper Extremity Assessment Upper Extremity Assessment: RUE deficits/detail RUE Deficits / Details: ataxia, dysmetria, tingling in fingers (since LUE arm shoulder in April 2023); overall 3+/5 RUE Coordination: decreased fine motor;decreased gross motor           Communication Communication Communication: No difficulties   Cognition Arousal/Alertness: Awake/alert Behavior During Therapy: WFL for tasks assessed/performed Overall Cognitive Status: Impaired/Different from baseline Area of Impairment: Following commands                       Following Commands: Follows one step commands with increased time (somtimes)                        Home Living Family/patient expects to be discharged to:: Private residence Living Arrangements: Spouse/significant other Available Help at Discharge: Family;Available PRN/intermittently Type of Home: House Home Access: Stairs to enter [employer] of Steps: 2 (front); several with rail (at back)--normal entry for  them Entrance Stairs-Rails: None Home Layout: Two level;Laundry or work area in basement;Able to live on main level with bedroom/bathroom     Bathroom Shower/Tub:  (roll in [employer])   Bathroom Toilet: Handicapped height     Home Equipment: Conservation officer, nature (2 wheels)   Additional Comments: retired Engineer, materials      Prior Functioning/Environment Prior Level of Function : Independent/Modified Independent                        OT Problem List: Decreased strength;Impaired balance (sitting and/or standing);Impaired vision/perception;Decreased coordination;Decreased cognition;Impaired UE functional use      OT Treatment/Interventions: Self-care/ADL training;DME and/or AE instruction;Patient/family education;Balance training;Visual/perceptual remediation/compensation;Therapeutic activities;Therapeutic exercise    OT Goals(Current goals can be found in the care plan section) Acute Rehab OT Goals Patient Stated Goal: to go to inpatient rehab before home OT Goal Formulation: With patient/family Time For Goal Achievement: 06/29/22 Potential to Achieve Goals: Good  OT Frequency: Min 2X/week       AM-PAC OT "6 Clicks" Daily Activity     Outcome Measure Help from another person eating meals?: A Little Help from another person taking care of personal grooming?: A Little Help from another person toileting, which includes using toliet, bedpan, or urinal?: A Lot Help from another person bathing (including washing, rinsing, drying)?: A Lot Help from another person to put on and taking off regular upper body clothing?: A Lot Help from another person to put on and taking off regular lower body clothing?: A  Lot 6 Click Score: 14   End of Session Equipment Utilized During Treatment: Gait belt Nurse Communication: Mobility status (diarrhea)  Activity Tolerance: Patient tolerated treatment well Patient left: in chair;with family/visitor present (PT getting ready to work with pt)  OT  Visit Diagnosis: Unsteadiness on feet (R26.81);Other abnormalities of gait and mobility (R26.89);Muscle weakness (generalized) (M62.81);Ataxia, unspecified (R27.0);Low vision, both eyes (H54.2);Cognitive communication deficit (R41.841);Hemiplegia and hemiparesis Symptoms and signs involving cognitive functions: Cerebral infarction Hemiplegia - Right/Left: Right Hemiplegia - dominant/non-dominant: Dominant Hemiplegia - caused by: Cerebral infarction                Time: 0810-0913 OT Time Calculation (min): 63 min Charges:  OT General Charges $OT Visit: 1 Visit OT Evaluation $OT Eval Moderate Complexity: 1 Mod OT Treatments $Self Care/Home Management : 38-52 mins  [address] [address], OTR/L Acute Rehab Services [facility] [phone] Office [phone]    Devin Mercado 06/15/2022, 10:07 AM

## 2022-06-16 DIAGNOSIS — R338 Other retention of urine: Secondary | ICD-10-CM | POA: Diagnosis not present

## 2022-06-16 DIAGNOSIS — I10 Essential (primary) hypertension: Secondary | ICD-10-CM | POA: Diagnosis not present

## 2022-06-16 DIAGNOSIS — I63432 Cerebral infarction due to embolism of left posterior cerebral artery: Secondary | ICD-10-CM

## 2022-06-16 DIAGNOSIS — R509 Fever, unspecified: Secondary | ICD-10-CM | POA: Diagnosis not present

## 2022-06-16 DIAGNOSIS — I442 Atrioventricular block, complete: Secondary | ICD-10-CM | POA: Diagnosis not present

## 2022-06-16 DIAGNOSIS — I480 Paroxysmal atrial fibrillation: Secondary | ICD-10-CM | POA: Diagnosis not present

## 2022-06-16 DIAGNOSIS — I639 Cerebral infarction, unspecified: Secondary | ICD-10-CM | POA: Diagnosis not present

## 2022-06-16 LAB — CBC
HCT: 21.5 % — ABNORMAL LOW (ref 39.0–52.0)
HCT: 20.4 % — ABNORMAL LOW (ref 39.0–52.0)
Hemoglobin: 7.6 g/dL — ABNORMAL LOW (ref 13.0–17.0)
Hemoglobin: 6.9 g/dL — CL (ref 13.0–17.0)
MCH: 28.4 pg (ref 26.0–34.0)
MCH: 27.8 pg (ref 26.0–34.0)
MCHC: 35.3 g/dL (ref 30.0–36.0)
MCHC: 33.8 g/dL (ref 30.0–36.0)
MCV: 80.2 fL (ref 80.0–100.0)
MCV: 82.3 fL (ref 80.0–100.0)
Platelets: 25 10*3/uL — CL (ref 150–400)
Platelets: 53 10*3/uL — ABNORMAL LOW (ref 150–400)
RBC: 2.68 MIL/uL — ABNORMAL LOW (ref 4.22–5.81)
RBC: 2.48 MIL/uL — ABNORMAL LOW (ref 4.22–5.81)
RDW: 17.2 % — ABNORMAL HIGH (ref 11.5–15.5)
RDW: 17.2 % — ABNORMAL HIGH (ref 11.5–15.5)
WBC: 3.7 10*3/uL — ABNORMAL LOW (ref 4.0–10.5)
WBC: 3.8 10*3/uL — ABNORMAL LOW (ref 4.0–10.5)
nRBC: 0 % (ref 0.0–0.2)
nRBC: 0 % (ref 0.0–0.2)

## 2022-06-16 LAB — BPAM RBC
Blood Product Expiration Date: 202312222359
Blood Product Expiration Date: 202312272359
ISSUE DATE / TIME: 202312010418
ISSUE DATE / TIME: 202312021707
Unit Type and Rh: 6200
Unit Type and Rh: 6200

## 2022-06-16 LAB — BASIC METABOLIC PANEL
Anion gap: 12 (ref 5–15)
BUN: 32 mg/dL — ABNORMAL HIGH (ref 8–23)
CO2: 17 mmol/L — ABNORMAL LOW (ref 22–32)
Calcium: 6.6 mg/dL — ABNORMAL LOW (ref 8.9–10.3)
Chloride: 100 mmol/L (ref 98–111)
Creatinine, Ser: 1.18 mg/dL (ref 0.61–1.24)
GFR, Estimated: >60 mL/min (ref >60)
Glucose, Bld: 114 mg/dL — ABNORMAL HIGH (ref 70–99)
Potassium: 3.2 mmol/L — ABNORMAL LOW (ref 3.5–5.1)
Sodium: 129 mmol/L — ABNORMAL LOW (ref 135–145)

## 2022-06-16 LAB — GASTROINTESTINAL PANEL BY PCR, STOOL (REPLACES STOOL CULTURE)

## 2022-06-16 LAB — TYPE AND SCREEN
ABO/RH(D): A POS
Antibody Screen: NEGATIVE
Unit division: 0
Unit division: 0

## 2022-06-16 LAB — PROCALCITONIN: Procalcitonin: 7.37 ng/mL

## 2022-06-16 LAB — T4, FREE: Free T4: 0.69 ng/dL (ref 0.61–1.12)

## 2022-06-16 LAB — TSH: TSH: 19.164 u[IU]/mL — ABNORMAL HIGH (ref 0.350–4.500)

## 2022-06-16 MED ORDER — SODIUM CHLORIDE 0.9% IV SOLUTION: Freq: Once | INTRAVENOUS | Status: AC

## 2022-06-16 MED ORDER — POTASSIUM CHLORIDE CRYS ER 20 MEQ PO TBCR
40.0000 meq | EXTENDED_RELEASE_TABLET | Freq: Four times a day (QID) | ORAL | Status: AC
  Administered 2022-06-16 (×2): 40 meq via ORAL
  Filled 2022-06-16 (×2): qty 2

## 2022-06-16 MED ORDER — CALCIUM GLUCONATE-NACL 1-0.675 GM/50ML-% IV SOLN
1.0000 g | Freq: Once | INTRAVENOUS | Status: AC
  Administered 2022-06-16: 1000 mg via INTRAVENOUS
  Filled 2022-06-16: qty 50

## 2022-06-16 MED ORDER — SODIUM CHLORIDE 0.9% IV SOLUTION: Freq: Once | INTRAVENOUS | Status: DC

## 2022-06-16 NOTE — Consult Note (Signed)
[facility]       Reason for Consult:fever, intermittent    Referring Physician: Dr. Waldron Labs  Principal Problem:   Hematuria Active Problems:   Hyperlipidemia   Essential hypertension   Atrioventricular block, complete (HCC)   PPM-[employer]   Prostate cancer ([place])   Malignant neoplasm metastatic to bone (HCC)   Acute urinary retention   Hyponatremia   Hypokalemia   Pancytopenia (HCC)    atorvastatin  20 mg Oral Daily   famotidine  20 mg Oral Daily   lidocaine  1 patch Transdermal Q24H   predniSONE  10 mg Oral q morning   sodium bicarbonate  650 mg Oral TID   tamsulosin  0.4 mg Oral QPC supper    Recommendations: Continue to observe off of antibiotics Monitor cultures   Assessment: Intermittent fever with no source.  I suspect this to be non-infection related since it continues intermittently with no concerning signs.  I will continue to monitor for any changes.   Antibiotics: None presently  HPI: Devin Mercado is a 71 y.o. male with a history of metastatic prostate cancer under the care at [facility] who came in 12/1 with hematuria.  He has known hemorrhagic cystitis and bleeding without any anticoagulation.  He was found to have an embolic-type stroke.  He had noted intermittent fevers up to 101 for several weeks though not sustained and associated only with chills.  He has a pacemaker and several joint replacements but no issues.  He is having some loose stools but less than 5 per day.  He was on cipro empirically but this has stopped.  He has pancytopenia believed to be from [place].     Review of Systems:  Constitutional: positive for fevers and chills All other systems reviewed and are negative    Past Medical History:  Diagnosis Date   Arthritis    OA AND PAIN LEFT HIP   CHB (complete heart block) ([place])    a. MDT dual chamber pacemaker   HTN (hypertension)    Hyperlipidemia    Presence of permanent cardiac pacemaker     2000   Prostate cancer ([place]) 09/2014    Social History   Tobacco Use   Smoking status: Never   Smokeless tobacco: Never  Vaping Use   Vaping Use: Never used  Substance Use Topics   Alcohol use: Yes    Alcohol/week: 2.0 - 3.0 standard drinks of alcohol    Types: 2 - 3 Standard drinks or equivalent per week    Comment: 5 TO 7 DRINKS A WEEK stopped etoh use pending surgery    Drug use: No    Family History  Problem Relation Age of Onset   Stroke Mother        multiple. 46.    Heart disease Mother        hx heart valve problem   Prostate cancer Father        59 of prostate cancer   Heart attack Neg Hx    Hypertension Neg Hx     No Known Allergies  Physical Exam: Constitutional: in no apparent distress  Vitals:   06/16/22 0900 06/16/22 1311  BP: (!) 146/74 (!) 155/86  Pulse: 99 91  Resp: 20 20  Temp: 97.6 F (36.4 C) 98.8 F (37.1 C)  SpO2: 98% 99%   EYES: anicteric ENMT: no thrush Respiratory: normal respiratory effort Chest: pacemaker site with no warmth, no erythema GI: soft Musculoskeletal: left shoulder with  no warmth or swelling Bilateral knees with no warmth or swelling  Lab Results  Component Value Date   WBC 3.7 (L) 06/16/2022   HGB 7.6 (L) 06/16/2022   HCT 21.5 (L) 06/16/2022   MCV 80.2 06/16/2022   PLT 25 (LL) 06/16/2022    Lab Results  Component Value Date   CREATININE 1.18 06/16/2022   BUN 32 (H) 06/16/2022   NA 129 (L) 06/16/2022   K 3.2 (L) 06/16/2022   CL 100 06/16/2022   CO2 17 (L) 06/16/2022    Lab Results  Component Value Date   ALT 11 06/06/2022   AST 18 06/04/2022   ALKPHOS 70 05/21/2022     Microbiology: Recent Results (from the past 240 hour(s))  Culture, blood (Routine X 2) w Reflex to ID Panel     Status: None (Preliminary result)   Collection Time: 06/13/22  7:01 PM   Specimen: BLOOD  Result Value Ref Range Status   Specimen Description   Final    BLOOD LEFT ANTECUBITAL Performed at [facility], [place] [address], [place], [place] [address]    Special Requests   Final    BOTTLES DRAWN AEROBIC ONLY Blood Culture adequate volume Performed at [facility] [address] [place], [place] [address]    Culture   Final    NO GROWTH 3 DAYS Performed at [facility], [place] [address] [place], [place] [address]    Report Status PENDING  Incomplete  Culture, blood (Routine X 2) w Reflex to ID Panel     Status: None (Preliminary result)   Collection Time: 06/13/22  7:04 PM   Specimen: BLOOD  Result Value Ref Range Status   Specimen Description   Final    BLOOD BLOOD RIGHT HAND Performed at [facility] [address] [place], [place] [address]    Special Requests   Final    BOTTLES DRAWN AEROBIC ONLY Blood Culture results may not be optimal due to an inadequate volume of blood received in culture bottles Performed at [facility] [address], [place],  [address]    Culture   Final    NO GROWTH 3 DAYS Performed at [facility], [place] [address], [place],  [address]    Report Status PENDING  Incomplete  Gastrointestinal Panel by PCR , Stool     Status: None   Collection Time: 06/16/22  4:01 AM   Specimen: Stool  Result Value Ref Range Status   Campylobacter species NOT DETECTED NOT DETECTED Final   Plesimonas shigelloides NOT DETECTED NOT DETECTED Final   Salmonella species NOT DETECTED NOT DETECTED Final   Yersinia enterocolitica NOT DETECTED NOT DETECTED Final   Vibrio species NOT DETECTED NOT DETECTED Final   Vibrio cholerae NOT DETECTED NOT DETECTED Final   Enteroaggregative E coli (EAEC) NOT DETECTED NOT DETECTED Final   Enteropathogenic E coli (EPEC) NOT DETECTED NOT DETECTED Final   Enterotoxigenic E coli (ETEC) NOT DETECTED NOT DETECTED Final   Shiga like toxin producing E coli (STEC) NOT DETECTED NOT DETECTED Final   Shigella/Enteroinvasive E coli (EIEC) NOT DETECTED NOT  DETECTED Final   Cryptosporidium NOT DETECTED NOT DETECTED Final   Cyclospora cayetanensis NOT DETECTED NOT DETECTED Final   Entamoeba histolytica NOT DETECTED NOT DETECTED Final   Giardia lamblia NOT DETECTED NOT DETECTED Final   Adenovirus F40/41 NOT DETECTED NOT DETECTED Final   Astrovirus NOT DETECTED NOT DETECTED Final  Norovirus GI/GII NOT DETECTED NOT DETECTED Final   Rotavirus A NOT DETECTED NOT DETECTED Final   Sapovirus (I, II, IV, and V) NOT DETECTED NOT DETECTED Final    Comment: Performed at [facility], [address] [place],  [address]    Thayer Headings, [email or web address] 06/16/2022, 1:32 PM

## 2022-06-16 NOTE — Progress Notes (Signed)
STROKE TEAM PROGRESS NOTE   INTERVAL HISTORY Devin wife  Dr Regis Mercado is at the bedside. Difficult clinical scenario with patient having active hematuria as well as hemotherapy induced thrombocytopenia and atrial fibrillation with embolic strokes.  Not a candidate for anticoagulation at the present time and probably not the long-term as well and remains at risk for recurrent strokes.  And not sure is a candidate for Watchman device either due to Devin malignancy and limited life span Patient is awake alert and interactive.  Still has some right sided incoordination but otherwise is improving.  Vital signs stable. Vitals:   06/16/22 0023 06/16/22 0407 06/16/22 0900 06/16/22 1311  BP: (!) 154/86 (!) 144/76 (!) 146/74 (!) 155/86  Pulse: 92 88 99 91  Resp: '18  20 20  '$ Temp: 99 F (37.2 C) 99.6 F (37.6 C) 97.6 F (36.4 C) 98.8 F (37.1 C)  TempSrc: Oral Oral Oral Oral  SpO2: 99%  98% 99%  Weight:      Height:       CBC:  Recent Labs  Lab 06/13/22 1901 06/14/22 0709 06/14/22 0844 06/14/22 2302 06/15/22 0814 06/16/22 0715  WBC 3.6*  --  2.4*   < > 3.9* 3.7*  NEUTROABS 1.9  --  1.4*  --   --   --   HGB 8.1*   < > 7.3*   < > 7.7* 7.6*  HCT 24.6*   < > 22.5*   < > 23.0* 21.5*  MCV 85.4  --  86.2   < > 82.4 80.2  PLT 80*  --  43*   < > 33* 25*   < > = values in this interval not displayed.   Basic Metabolic Panel:  Recent Labs  Lab 06/13/22 1901 06/14/22 0254 06/15/22 0814 06/16/22 0715  NA 125*   < > 130* 129*  K 4.3   < > 3.5 3.2*  CL 98   < > 102 100  CO2 18*   < > 18* 17*  GLUCOSE 142*   < > 109* 114*  BUN 30*   < > 28* 32*  CREATININE 1.17   < > 1.43* 1.18  CALCIUM 6.9*   < > 6.4* 6.6*  MG 2.1  --   --   --    < > = values in this interval not displayed.   Lipid Panel: No results for input(s): "CHOL", "TRIG", "HDL", "CHOLHDL", "VLDL", "LDLCALC" in the last 168 hours. HgbA1c: No results for input(s): "HGBA1C" in the last 168 hours. Urine Drug Screen: No results for  input(s): "LABOPIA", "COCAINSCRNUR", "LABBENZ", "AMPHETMU", "THCU", "LABBARB" in the last 168 hours.  Alcohol Level No results for input(s): "ETH" in the last 168 hours.  IMAGING past 24 hours CT HEAD WO CONTRAST (5MM)  Result Date: 06/15/2022 CLINICAL DATA:  Stroke follow-up EXAM: CT HEAD WITHOUT CONTRAST TECHNIQUE: Contiguous axial images were obtained from the base of the skull through the vertex without intravenous contrast. RADIATION DOSE REDUCTION: This exam was performed according to the departmental dose-optimization program which includes automated exposure control, adjustment of the mA and/or kV according to patient size and/or use of iterative reconstruction technique. COMPARISON:  06/14/2022 FINDINGS: Brain: There are areas of loss of gray-white differentiation in the left occipital lobe and right parietal lobe, unchanged. No acute hemorrhage. Mild generalized volume loss. There is periventricular hypoattenuation compatible with chronic microvascular disease. Incidentally noted cavum septum pellucidum et vergae. Vascular: No hyperdense vessel or unexpected calcification. Skull: Normal. Negative for fracture or  focal lesion. Sinuses/Orbits: No acute finding. Other: None. IMPRESSION: Unchanged areas of loss of gray-white differentiation in the left occipital lobe and right parietal lobe, consistent with acute/subacute infarcts. No acute hemorrhage. Electronically Signed   By: Ulyses Jarred M.D.   On: 06/15/2022 21:54   ECHOCARDIOGRAM COMPLETE  Result Date: 06/15/2022    ECHOCARDIOGRAM REPORT   Patient Name:   Devin Mercado [facility] Date of Exam: 06/15/2022 Medical Rec #:  [id number]        Height:       72.0 in Accession #:    [id number]       Weight:       227.1 lb Date of Birth:  23-Oct-1950         BSA:          2.249 m Patient Age:    71 years         BP:           120/59 mmHg Patient Gender: M                HR:           88 bpm. Exam Location:  Inpatient Procedure: 2D Echo, Color Doppler and Cardiac  Doppler Indications:    Stroke i63.9  History:        Patient has no prior history of Echocardiogram examinations.                 Pacemaker; Risk Factors:Hypertension and Dyslipidemia.  Sonographer:    Raquel Sarna Senior RDCS Referring Phys: [phone] [facility]  Sonographer Comments: Technically difficult due to poor echo windows. IMPRESSIONS  1. Difficult acoustic windows limit study Would recomm limited echo with Definity to confirm LVEF and wall motion. There appears to be hypokinesis of the mid/distal lateral, mid/distal septal, distal inferior walls and apical akinesis.. Left ventricular  ejection fraction, by estimation, is 30%. The left ventricle has moderately decreased function.  2. Right ventricular systolic function is normal. The right ventricular size is normal. There is normal pulmonary artery systolic pressure.  3. The mitral valve is normal in structure. Trivial mitral valve regurgitation.  4. The aortic valve is normal in structure. Aortic valve regurgitation is not visualized.  5. There is mild dilatation of the ascending aorta, measuring 42 mm.  6. The inferior vena cava is normal in size with greater than 50% respiratory variability, suggesting right atrial pressure of 3 mmHg. FINDINGS  Left Ventricle: Difficult acoustic windows limit study Would recomm limited echo with Definity to confirm LVEF and wall motion. There appears to be hypokinesis of the mid/distal lateral, mid/distal septal, distal inferior walls and apical akinesis. Left  ventricular ejection fraction, by estimation, is 30%. The left ventricle has moderately decreased function. The left ventricular internal cavity size was normal in size. There is no left ventricular hypertrophy. Right Ventricle: The right ventricular size is normal. Right vetricular wall thickness was not assessed. Right ventricular systolic function is normal. There is normal pulmonary artery systolic pressure. The tricuspid regurgitant velocity is 2.31 m/s, and  with an assumed right atrial pressure of 3 mmHg, the estimated right ventricular systolic pressure is 70.9 mmHg. Left Atrium: Left atrial size was normal in size. Right Atrium: Right atrial size was normal in size. Pericardium: Trivial pericardial effusion is present. Mitral Valve: The mitral valve is normal in structure. Trivial mitral valve regurgitation. Tricuspid Valve: The tricuspid valve is grossly normal. Tricuspid valve regurgitation is mild. Aortic Valve: The aortic valve is normal in structure.  Aortic valve regurgitation is not visualized. Pulmonic Valve: The pulmonic valve was not well visualized. Pulmonic valve regurgitation is not visualized. No evidence of pulmonic stenosis. Aorta: There is mild dilatation of the ascending aorta, measuring 42 mm. Venous: The inferior vena cava is normal in size with greater than 50% respiratory variability, suggesting right atrial pressure of 3 mmHg. IAS/Shunts: No atrial level shunt detected by color flow Doppler. Additional Comments: A device lead is visualized.  LEFT VENTRICLE PLAX 2D LVIDd:         5.50 cm LVIDs:         3.30 cm LV PW:         1.10 cm LV IVS:        1.20 cm LVOT diam:     2.40 cm LV SV:         85 LV SV Index:   38 LVOT Area:     4.52 cm  LV Volumes (MOD) LV vol d, MOD A2C: 112.0 ml LV vol d, MOD A4C: 139.0 ml LV vol s, MOD A2C: 65.0 ml LV vol s, MOD A4C: 72.7 ml LV SV MOD A2C:     47.0 ml LV SV MOD A4C:     139.0 ml LV SV MOD BP:      56.6 ml RIGHT VENTRICLE RV S prime:     17.70 cm/s TAPSE (M-mode): 2.2 cm LEFT ATRIUM             Index        RIGHT ATRIUM           Index LA diam:        3.60 cm 1.60 cm/m   RA Area:     21.10 cm LA Vol (A2C):   66.4 ml 29.53 ml/m  RA Volume:   60.60 ml  26.95 ml/m LA Vol (A4C):   63.5 ml 28.24 ml/m LA Biplane Vol: 65.8 ml 29.26 ml/m  AORTIC VALVE LVOT Vmax:   102.00 cm/s LVOT Vmean:  71.100 cm/s LVOT VTI:    0.188 m  AORTA Ao Root diam: 4.00 cm Ao Asc diam:  4.20 cm TRICUSPID VALVE TR Peak grad:   21.3 mmHg  TR Vmax:        231.00 cm/s  SHUNTS Systemic VTI:  0.19 m Systemic Diam: 2.40 cm Dorris Carnes MD Electronically signed by Dorris Carnes MD Signature Date/Time: 06/15/2022/4:20:16 PM    Final     PHYSICAL EXAM  Temp:  [97.6 F (36.4 C)-99.6 F (37.6 C)] 98.8 F (37.1 C) (12/04 1311) Pulse Rate:  [88-99] 91 (12/04 1311) Resp:  [16-20] 20 (12/04 1311) BP: (121-155)/(56-86) 155/86 (12/04 1311) SpO2:  [97 %-99 %] 99 % (12/04 1311)  General - Well nourished, well developed pleasant middle-aged Caucasian male, in no apparent distress.Sitting up in chair.   Mental Status -  Level of arousal and orientation to time, place, and person were intact. Language including expression, naming, repetition, comprehension was assessed, slight dysarthria.  Fund of Knowledge was assessed and was intact.  Cranial Nerves II - XII - II - left upper visual cut. III, IV, VI - Extraocular movements intact. V - Facial sensation intact bilaterally. VII - Facial movement intact bilaterally. VIII - Hearing & vestibular intact bilaterally. X - Palate elevates symmetrically. XI - Chin turning & shoulder shrug intact unsymmetrical. Patient had recent shoulder surgery and said this is Devin baseline. XII - Tongue protrusion intact.  Motor Strength - The patient's strength was normal in all extremities. RUE  exhibited slight pronotor drift, decreased grip.  He has left shoulder replacement in the past and this may be limiting range of motion. Motor Tone - Muscle tone was assessed at the neck and appendages and was normal.  Sensory - Light touch, temperature/pinprick were assessed and were symmetrical.    Coordination - Ataxia/Decreased coordination present in RUE. Patient able to hold cup bit had problems setting cup back down on table and was unable to properly do FNT on RUE.   Gait and Station - deferred.  ASSESSMENT/PLAN Devin Mercado is a 71 y.o. male with history of hypertension, hyperlipidemia,complete heart  block, s/p non-MRI-compatible pacemaker, metastatic prostate cancer with bone mets (treated at [facility]), presenting with acute right arm ataxia while being treated at [facility] for gross hematuria. CT showed L occipital and R parietal infarct. Transferred to [facility] for stroke work-up. Repeat CT ordered today.   Stroke Etiology: Likely cardioembolic from new onset A-fib CT head: Focal hypoattenuation in the posterior right parietal lobe measuring up to 2.5 cm. This is concerning for acute or subacute infarct. Focal cortical hypoattenuation in the posterior left occipital lobe is also concerning for acute or subacute infarct. No other acute or focal cortical abnormalities. Aspects is 9/10. Stable atrophy and white matter disease. CTA head & neck: No emergent large vessel occlusion. Minimal atherosclerotic changes within the proximal right ICA and cavernous internal carotid arteries bilaterally without significant stenosis. Multiple sclerotic lesions throughout the cervical and upper thoracic spine and ribs bilaterally consistent with metastatic disease.  MRI  unable to obtain d/t non-MRI-compatible pacemaker 2D Echo LVEF 30% with apical kinesis and diffuse hypokinesis  TEE pending. LDL 98 HgbA1c 5.6 VTE prophylaxis - SCDs    Diet   Diet Heart Room service appropriate? Yes; Fluid consistency: Thin   Diet NPO time specified Except for: Sips with Meds   No antithrombotic prior to admission.  On no antithrombotic at present because of frank hematuria and thrombocytopenia Therapy recommendations:  pending Disposition:  pending  Hypertension Home meds:  valsartan Permissive hypertension (OK if < 220/120) but gradually normalize in 5-7 days Long-term BP goal normotensive  Hyperlipidemia Home meds:  lipitor '20mg'$ , resumed in hospital LDL 98, goal < 70 Continue statin at discharge  Other Stroke Risk Factors Advanced Age >/= 45  Obesity, Body mass index is 30.8 kg/m., BMI >/= 30 associated with  increased stroke risk, recommend weight loss, diet and exercise as appropriate  Family hx stroke (mother)  Other Active Problems (per Primary Team) Prostate Cancer Leukopenia  (2.4->5.4->3.9) Urinary retention, hematuria  Urology consulted Anemia (7.2->7.3->8.2->7.7)   Hospital day # 4   Patient has presented with by cerebral embolic strokes from new onset A-fib but unfortunately is not a candidate for anticoagulation at the present time due to ongoing hematuria from radiation proctitis as well as thrombocytopenia from chemotherapy.  He is not a long-term anticoagulation candidate and may not be a good candidate for Watchman device either due to Devin metastatic prostate cancer and limited life expectancy.  Consideration for palliative care consultation for discussion about goals of care is recommended.  Long discussion with patient and Devin wife Dr. Regis Mercado as well as Dr. Emeline Gins and answered questions.  Greater than 50% time during this 35-minute visit was spent in counseling and coordination of care about Devin embolic strokes, atrial fibrillation, discussion about risk benefit of anticoagulation and alternatives Watchman device discussion about goals of care and answering questions.  Mariadelcarmen Corella,MD   To contact Stroke Continuity  provider, please refer to [email or web address] After hours, contact General Neurology

## 2022-06-16 NOTE — Progress Notes (Signed)
TRH night cross cover note:   I was notified by RN of PVR bladder scan of 416 cc's. Patient feels like he has additional urine in his bladder, but is unable to pass additional urine at this time. Straight cath x 1 now ordered. Continue to monitor strict I&O's, including ensuing urine output in this patient with history of urinary rentention.      Babs Bertin, DO Hospitalist

## 2022-06-16 NOTE — Progress Notes (Signed)
  Transition of Care [facility]) Screening Note   Patient Details  Name: CARMELLO CABINESS Date of Birth: 14-Apr-1951   [facility] CM/SW Contact:    Pollie Friar, RN Phone Number: 06/16/2022, 12:51 PM   Pt is from home  with spouse. CIR starting insurance auth for potential IR admit.  Transition of Care Department [facility]) has reviewed patient. We will continue to monitor patient advancement through interdisciplinary progression rounds. If new patient transition needs arise, please place a TOC consult.

## 2022-06-16 NOTE — Progress Notes (Signed)
Physical Therapy Treatment Patient Details Name: Devin Mercado MRN: [id number] DOB: December 14, 1950 Today's Date: 06/16/2022   History of Present Illness This is a 71 year old male presents with hematuria. 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 PHMx: prostate cancer with bone mets, anemia, pancytopenia.    PT Comments    Pt greeted seated EOB eager for OOB mobility. Pt able to complete step pivot EOB>BSC>recliner with up to mod assist to power up and steady on rise. Pt with increased activation of RUE, placing R hand on RW without assist and manipulating underwear, however noted continued impairment in fine motor skills. Pt progressing gait this session with grossly min guard for safety, x1 LOB during turning needing mod assist to correct. Pt able to maintain R hand on RW throughout without assist. Pt continues to be limited by decreased safety awareness, weakness, fatigue and impaired balance/postural reactions. Current plan remains appropriate to address deficits and maximize functional independence and decrease caregiver burden, will continue to follow acutely.   HR 89-114bpm during activity   Recommendations for follow up therapy are one component of a multi-disciplinary discharge planning process, led by the attending physician.  Recommendations may be updated based on patient status, additional functional criteria and insurance authorization.  Follow Up Recommendations  Acute inpatient rehab (3hours/day)     Assistance Recommended at Discharge Frequent or constant Supervision/Assistance  Patient can return home with the following A lot of help with walking and/or transfers;A lot of help with bathing/dressing/bathroom;Assistance with cooking/housework;Assist for transportation;Help with stairs or ramp for entrance   Equipment Recommendations  None recommended by PT     Recommendations for Other Services       Precautions / Restrictions Precautions Precautions: Fall Precaution Comments: having diarrhea on 12/4 Restrictions Weight Bearing Restrictions: No     Mobility  Bed Mobility Overal bed mobility: Needs Assistance             General bed mobility comments: seated eob ON ARRIVAL    Transfers Overall transfer level: Needs assistance Equipment used: Rolling walker (2 wheels) Transfers: Sit to/from Stand Sit to Stand: Mod assist, Min assist   Step pivot transfers: Min assist       General transfer comment: cues for sequencing, increased time, assist to power up EOB>BSC>recliner    Ambulation/Gait Ambulation/Gait assistance: Min guard, Mod assist Gait Distance (Feet): 100 Feet Assistive device: Rolling walker (2 wheels) Gait Pattern/deviations: Step-through pattern, Trunk flexed, Shuffle       General Gait Details: shuffling gait with standing rest every ~5 steps, gorssly min guard, mod asssit to correct x1 LOB during turning, cues to step into RW with pt able to maintain throghout,   Stairs             Wheelchair Mobility    Modified Rankin (Stroke Patients Only) Modified Rankin (Stroke Patients Only) Pre-Morbid Rankin Score: No symptoms Modified Rankin: Moderately severe disability     Balance Overall balance assessment: Needs assistance Sitting-balance support: Feet supported, No upper extremity supported Sitting balance-Leahy Scale: Fair     Standing balance support: Bilateral upper extremity supported, During functional activity, Reliant on assistive device for balance Standing balance-Leahy Scale: Poor Standing balance comment: reliance on RW for support                            Cognition Arousal/Alertness: Awake/alert  Behavior During Therapy: Flat affect Overall Cognitive Status: Impaired/Different from baseline Area of Impairment: Following commands, Problem solving, Awareness                        Following Commands: Follows one step commands with increased time   Awareness: Emergent Problem Solving: Slow processing, Difficulty sequencing          Exercises      General Comments General comments (skin integrity, edema, etc.): HR up to 114bpm during activity, no c/o dizziness      Pertinent Vitals/Pain Pain Assessment Pain Assessment: No/denies pain    Home Living                          Prior Function            PT Goals (current goals can now be found in the care plan section) Acute Rehab PT Goals Patient Stated Goal: home PT Goal Formulation: With patient/family Time For Goal Achievement: 06/29/22    Frequency    Min 4X/week      PT Plan      Co-evaluation              AM-PAC PT "6 Clicks" Mobility   Outcome Measure  Help needed turning from your back to your side while in a flat bed without using bedrails?: A Little Help needed moving from lying on your back to sitting on the side of a flat bed without using bedrails?: A Lot Help needed moving to and from a bed to a chair (including a wheelchair)?: A Little Help needed standing up from a chair using your arms (e.g., wheelchair or bedside chair)?: A Lot Help needed to walk in hospital room?: A Lot Help needed climbing 3-5 steps with a railing? : Total 6 Click Score: 13    End of Session Equipment Utilized During Treatment: Gait belt Activity Tolerance: Patient tolerated treatment well Patient left: in chair;with call bell/phone within reach;with chair alarm set Nurse Communication: Mobility status PT Visit Diagnosis: Other abnormalities of gait and mobility (R26.89);Muscle weakness (generalized) (M62.81)     Time: 2353-6144 PT Time Calculation (min) (ACUTE ONLY): 32 min  Charges:  $Gait Training: 8-22 mins $Therapeutic Activity: 8-22 mins                     Emalee Knies R. PTA Acute Rehabilitation Services Office: [place] 06/16/2022, 9:40 AM

## 2022-06-16 NOTE — Progress Notes (Signed)
TRH night cross cover note:   I was notified by RN of finding of urinary retnetion with elevated PVR bladder scan on multiple occasions over course of this night shift. Early in night shift, this prompted straight cath yielding 300 cc of bloody urine associated with clot.  Vital signs at the time will for notable for systolic blood pressures in the 130s 140s, with heart rate in the 70s to 80s.  In the setting of thrombocytopenia, he has received transfusion 2 units of platelets earlier today, in addition to a total of 2 units PRBC transfused earlier in this hospitalization, with 1 unit on 06/13/2022 and another unit PRBC on 06/14/2022.  Given additional gross hematuria on the above straight cath and this patient required PRBC and platelet transfusion during this hospitalization, ordered updated CBC around 2300, which showed hemoglobin 6.9, down from 7.6 earlier this morning, as well as platelet count of 53, up from 25 following interval transfusion of 2 units of platelets.  I subsequently ordered transfusion of 1 unit PRBC over 3 hours, and noted existing order for CBC in the morning.  Subsequent postvoid residual bladder scan around 6 AM showed PVR 401 cc's, a/w urge to urinate. Did not under straight cath at that time, rather will defer to rounding hospitalist regarding additional decision making a/w  urinary retention.    Newton Pigg, DO Hospitalist

## 2022-06-16 NOTE — Progress Notes (Signed)
Reported to DR [facility] latest bladder scan done to pt with urine retention of 416, pt is in pain, felling full and bladder is distended

## 2022-06-16 NOTE — Progress Notes (Signed)
1 unit platelet completed and terminated, no BT reaction was noted

## 2022-06-16 NOTE — Progress Notes (Signed)
In and out straight cath done, under aseptic technique with urine output 300 ml bloody urine with small blood clot, procedure witnessed by RN Amy informed DR Casilda Carls

## 2022-06-16 NOTE — Progress Notes (Signed)
Patient ID: Devin Mercado, male   DOB: January 05, 1951, 71 y.o.   MRN: [id number]  Events from the weekend noted and I have spoken with Dr. Lovena Neighbours.  Pt noted to have CVA likely related to atrial fibrillation.  Dr. Tanna Furry note reviewed.  Devin Mercado will remain at high risk for urologic bleeding requiring transfusion if anticoagulated due to radiation cystitis and thrombocytopenia/anemia.  Will defer to primary team/cardiology if the benefit outweighs the risk but the risk would be significant.

## 2022-06-16 NOTE — Progress Notes (Signed)
Inpatient Rehab Admissions Coordinator:    I met with  Pt. To discuss potential CIR admit. He is interested. Wife works part time but says she can reduce her hours and will ask other family to assist for 24/7 support at d/c. I will open a case with pt.'s insurance auth pursue for CIR admit.    , MS, CCC-SLP Rehab Admissions Coordinator  [phone] (celll) [phone] (office)  

## 2022-06-16 NOTE — Progress Notes (Signed)
Secured message sent to Dr Casilda Carls, pt latest hgb 6.9

## 2022-06-16 NOTE — Progress Notes (Signed)
   Devin Mercado has been requested to perform a transesophageal echocardiogram on Devin Mercado for fever of unknown origin.  After careful review of history and examination, the risks and benefits of transesophageal echocardiogram have been explained including risks of esophageal damage, perforation (1:10,000 risk), bleeding, pharyngeal hematoma as well as other potential complications associated with anesthesia including aspiration, arrhythmia, respiratory failure and death. Alternatives to treatment were discussed, questions were answered. Patient is willing to proceed.   Patient with PMH of prostate CA with bone mets followed by [facility],  anemia, pancytopenia from bone marrow suprresion on Pluvicto presented with CVA and afib and fever of unknown origin. Vital sign stable, however platelet is too low at 25, discussed with Dr. Gasper Sells and Dr. Waldron Labs, will delay TEE until this Thursday. Dr. Waldron Labs plans to give the patient platelet the day before.   [place], [place] 06/16/2022 1:37 PM

## 2022-06-16 NOTE — PMR Pre-admission (Shared)
PMR Admission Coordinator Pre-Admission Assessment  Patient: Devin Mercado is an 71 y.o., male MRN: [id number] DOB: 11/05/1950 Height: 6' (182.9 cm) Weight: 103 kg  Insurance Information HMO:  PPO: yes     PCP:      IPA:      80/20:      OTHER:  PRIMARY: [employer]      Policy#: [id number]      Subscriber: pt  CM Name: Luster Landsberg       Phone:[phone]     Fax#: [phone]  I received a call from [facility] at [facility] regarding potential CIR admit  12/5 for 7 days with update due 44/31 Pre-Cert#: [id number]      Employer:  Benefits:  Phone #:      Name:  Irene Shipper Date: 07/14/2020- still active  Deductible: does not have  OOP Max: $3,300 ($2,560.73 met)  CIR: $125/day co-pay with a max co-pay of $1,250/admission (10 days) SNF: $0/day co-pay for days 1-20, $50/day co-pay for days 21-100, limited to 100 days/cal yr   Outpatient:  $20/visit co-pay  Home Health:  100% coverage  DME: 80% coverage; 20% co-insurance  Providers: in [employer]  SECONDARY:       Policy#:      Phone#:    Development worker, community:       Phone#:   The Engineer, petroleum" for patients in Inpatient Rehabilitation Facilities with attached "Privacy Act [place] Records" was provided and verbally reviewed with: Patient  Emergency Contact Information Contact Information     Name Relation Home Work [place] Spouse [phone]       Current Medical History  Patient Admitting Diagnosis: CVA History of Present Illness:  Pt is a 71 year old male with history of prostate cancer with bone mets, treated at [facility].  He also has anemia associated with bone marrow infiltration for which he receives outpatient transfusions.  He also has pancytopenia from bone marrow suppression from Pluvicto.  He presented to the [facility]  ED 06/13/22 with hematuria.  the day prior to admission,  he woke with blood in his pants.  He went to his oncologist office,  Foley was placed, he was irrigated until clear.  He has Foley was removed, he urinated in the office, and he was discharged home.  During the night he woke up with pain, and realized he was again blocked up.  He went to [facility], three-way Foley placed, bladder irrigation started.  He was transferred to [facility] Pt found to have RUE weakness.  CT revealed: infarcts involving the posterior right parietal lobe and left occipital lobe and Pt. Was transferred to [facility] for furhter management.  Pt. Seen by PT/OT who recommended CIR to assist return to PLOF.    Complete NIHSS TOTAL: 2  Patient's medical record from [facility]  has been reviewed by the rehabilitation admission coordinator and physician.  Past Medical History  Past Medical History:  Diagnosis Date   Arthritis    OA AND PAIN LEFT HIP   CHB (complete heart block) ([facility])    a. MDT dual chamber pacemaker   HTN (hypertension)    Hyperlipidemia    Presence of permanent cardiac pacemaker    2000   Prostate cancer ([place]) 09/2014    Has the patient had major surgery during 100 days prior to admission? Yes  Family History   family history includes Heart disease in his mother; Prostate cancer in his  father; Stroke in his mother.  Current Medications  Current Facility-Administered Medications:    0.9 %  sodium chloride infusion (Manually program via Guardrails IV Fluids), , Intravenous, Once, Elgergawy, Silver Huguenin, MD   0.9 %  sodium chloride infusion, , Intravenous, Continuous, Elgergawy, Silver Huguenin, MD, Stopped at 06/16/22 1409   acetaminophen (TYLENOL) tablet 650 mg, 650 mg, Oral, Q6H PRN, 650 mg at 06/15/22 0218 **OR** acetaminophen (TYLENOL) suppository 650 mg, 650 mg, Rectal, Q6H PRN, Crosley, Debby, MD   atorvastatin (LIPITOR) tablet 20 mg, 20 mg, Oral, Daily, Dessa Phi, DO, 20 mg at 06/16/22 0902   famotidine (PEPCID) tablet 20 mg, 20 mg, Oral, Daily, Elgergawy, Silver Huguenin, MD, 20 mg at  06/16/22 0903   lidocaine (LIDODERM) 5 % 1 patch, 1 patch, Transdermal, Q24H, Dessa Phi, DO, 1 patch at 06/16/22 0902   LORazepam (ATIVAN) injection 0.5 mg, 0.5 mg, Intravenous, Once PRN, Howerter, Justin B, DO   morphine (PF) 2 MG/ML injection 2 mg, 2 mg, Intravenous, Q4H PRN, Crosley, Debby, MD, 2 mg at 06/13/22 0920   oxyCODONE (Oxy IR/ROXICODONE) immediate release tablet 5 mg, 5 mg, Oral, Q6H PRN, Crosley, Debby, MD, 5 mg at 06/13/22 0411   potassium chloride SA (KLOR-CON M) CR tablet 40 mEq, 40 mEq, Oral, Q6H, Elgergawy, Silver Huguenin, MD, 40 mEq at 06/16/22 1407   predniSONE (DELTASONE) tablet 10 mg, 10 mg, Oral, q morning, Crosley, Debby, MD, 10 mg at 06/16/22 0902   senna-docusate (Senokot-S) tablet 1 tablet, 1 tablet, Oral, QHS PRN, Crosley, Debby, MD   sodium bicarbonate tablet 650 mg, 650 mg, Oral, TID, Elgergawy, Silver Huguenin, MD, 650 mg at 06/16/22 0902   tamsulosin (FLOMAX) capsule 0.4 mg, 0.4 mg, Oral, QPC supper, Crosley, Debby, MD, 0.4 mg at 06/15/22 1751   traMADol (ULTRAM) tablet 50 mg, 50 mg, Oral, Q8H PRN, Dessa Phi, DO  Patients Current Diet:  Diet Order             Diet NPO time specified Except for: Sips with Meds  Diet effective midnight           Diet Heart Room service appropriate? Yes; Fluid consistency: Thin  Diet effective now                   Precautions / Restrictions Precautions Precautions: Fall Precaution Comments: having diarrhea on 12/4 Restrictions Weight Bearing Restrictions: No   Has the patient had 2 or more falls or a fall with injury in the past year? Yes  Prior Activity Level Community (5-7x/wk): Active in the community PTA  Prior Functional Level Self Care: Did the patient need help bathing, dressing, using the toilet or eating? Independent  Indoor Mobility: Did the patient need assistance with walking from room to room (with or without device)? Independent  Stairs: Did the patient need assistance with internal or external  stairs (with or without device)? Independent  Functional Cognition: Did the patient need help planning regular tasks such as shopping or remembering to take medications? Independent  Patient Information Are you of Hispanic, Latino/a,or Spanish origin?: A. No, not of Hispanic, Latino/a, or Spanish origin What is your race?: A. White Do you need or want an interpreter to communicate with a doctor or health care staff?: 0. No  Patient's Response To:  Health Literacy and Transportation Is the patient able to respond to health literacy and transportation needs?: Yes Health Literacy - How often do you need to have someone help you when you read instructions, pamphlets, or  other written material from your doctor or pharmacy?: Never In the past 12 months, has lack of transportation kept you from medical appointments or from getting medications?: No In the past 12 months, has lack of transportation kept you from meetings, work, or from getting things needed for daily living?: No  Home Assistive Devices / [place] Devices/Equipment: None Home Equipment: Conservation officer, nature (2 wheels)  Prior Device Use: Indicate devices/aids used by the patient prior to current illness, exacerbation or injury?  None of the above  Current Functional Level Cognition  Overall Cognitive Status: Impaired/Different from baseline Orientation Level: Oriented X4 Following Commands: Follows one step commands with increased time    Extremity Assessment (includes Sensation/Coordination)  Upper Extremity Assessment: Defer to OT evaluation RUE Deficits / Details: ataxia, dysmetria, tingling in fingers (since LUE arm shoulder in April 2023); overall 3+/5 RUE Coordination: decreased fine motor, decreased gross motor  Lower Extremity Assessment: Generalized weakness (LE strength appears symmetrical but overall weak)    ADLs  Overall ADL's : Needs assistance/impaired Eating/Feeding: Supervision/ safety, Set up,  Sitting Grooming: Set up, Supervision/safety, Sitting Upper Body Bathing: Set up, Supervision/ safety, Sitting Lower Body Bathing: Moderate assistance Lower Body Bathing Details (indicate cue type and reason): Mod A sit<>stand Upper Body Dressing : Moderate assistance, Sitting Lower Body Dressing: Maximal assistance Lower Body Dressing Details (indicate cue type and reason): Mod A sit<>stand Toilet Transfer: Minimal assistance, Stand-pivot, BSC/3in1 Toileting- Clothing Manipulation and Hygiene: Total assistance Toileting - Clothing Manipulation Details (indicate cue type and reason): Mod A sit<>stand    Mobility  Overal bed mobility: Needs Assistance Bed Mobility: Supine to Sit Supine to sit: Mod assist General bed mobility comments: seated eob ON ARRIVAL    Transfers  Overall transfer level: Needs assistance Equipment used: Rolling walker (2 wheels) Transfers: Sit to/from Stand Sit to Stand: Mod assist, Min assist Bed to/from chair/wheelchair/BSC transfer type:: Step pivot Step pivot transfers: Min assist General transfer comment: cues for sequencing, increased time, assist to power up EOB>BSC>recliner    Ambulation / Gait / Stairs / Wheelchair Mobility  Ambulation/Gait Ambulation/Gait assistance: Min guard, Mod assist Gait Distance (Feet): 100 Feet Assistive device: Rolling walker (2 wheels) Gait Pattern/deviations: Step-through pattern, Trunk flexed, Shuffle General Gait Details: shuffling gait with standing rest every ~5 steps, gorssly min guard, mod asssit to correct x1 LOB during turning, cues to step into RW with pt able to maintain throghout,    Posture / Balance Balance Overall balance assessment: Needs assistance Sitting-balance support: Feet supported, No upper extremity supported Sitting balance-Leahy Scale: Fair Standing balance support: Bilateral upper extremity supported, During functional activity, Reliant on assistive device for balance Standing balance-Leahy  Scale: Poor Standing balance comment: reliance on RW for support    Special needs/care consideration Special service needs none   Previous Home Environment (from acute therapy documentation) Living Arrangements: Spouse/significant other  Lives With: Spouse Available Help at Discharge: Family, Available PRN/intermittently Type of Home: House Home Layout: Two level, Laundry or work area in basement, Able to live on main level with bedroom/bathroom Home Access: Stairs to enter Entrance Stairs-Rails: None Entrance Stairs-Number of Steps: 2 (front); several with rail (at back)--normal entry for them Bathroom Shower/Tub: Other (comment) (roll in shower) Bathroom Toilet: Handicapped [place]: No Additional Comments: retired Doctor, hospital for Discharge Living Setting: Patient's home Type of Home at Discharge: [place]: Two level, Able to live on main level with bedroom/bathroom Discharge Home Access: Stairs  to enter Entrance Stairs-Rails: None Entrance Stairs-Number of Steps: 2 Discharge Bathroom Shower/Tub: Other (comment) Discharge Bathroom Toilet: Handicapped height Discharge Bathroom Accessibility: No Does the patient have any problems obtaining your medications?: No  Social/Family/Support Systems Patient Roles: Spouse Contact Information: [phone] Anticipated Caregiver: Mariann Laster Anticipated Caregiver's Contact Information: [phone] Ability/Limitations of Caregiver: Min A Caregiver Availability: 24/7 Discharge Plan Discussed with Primary Caregiver: Yes Is Caregiver In Agreement with Plan?: Yes Does Caregiver/Family have Issues with Lodging/Transportation while Pt is in Rehab?: Yes  Goals Patient/Family Goal for Rehab: PT/OT/SLP Supervision Expected length of stay: 12-14 days Pt/Family Agrees to Admission and willing to participate: Yes Program Orientation Provided & Reviewed with Pt/Caregiver  Including Roles  & Responsibilities: No  Decrease burden of Care through IP rehab admission:none  Possible need for SNF placement upon discharge: none   Patient Condition: I have reviewed medical records from [facility] , spoken with CM, and patient. I met with patient at the bedside for inpatient rehabilitation assessment.  Patient will benefit from ongoing PT and OT, can actively participate in 3 hours of therapy a day 5 days of the week, and can make measurable gains during the admission.  Patient will also benefit from the coordinated team approach during an Inpatient Acute Rehabilitation admission.  The patient will receive intensive therapy as well as Rehabilitation physician, nursing, social worker, and care management interventions.  Due to safety, skin/wound care, disease management, medication administration, pain management, and patient education the patient requires 24 hour a day rehabilitation nursing.  The patient is currently *** with mobility and basic ADLs.  Discharge setting and therapy post discharge at home with home health is anticipated.  Patient has agreed to participate in the Acute Inpatient Rehabilitation Program and will admit {Time; today/tomorrow:10263}.  Preadmission Screen Completed By:  Genella Mech, 06/16/2022 6:31 PM ______________________________________________________________________   Discussed status with Dr. Marland Kitchen on *** at *** and received approval for admission today.  Admission Coordinator:  Genella Mech, CCC-SLP, time Marland KitchenSudie Grumbling ***   Assessment/Plan: Diagnosis: Does the need for close, 24 hr/day Medical supervision in concert with the patient's rehab needs make it unreasonable for this patient to be served in a less intensive setting? {yes_no_potentially:3041433} Co-Morbidities requiring supervision/potential complications: *** Due to {due KG:8811031}, does the patient require 24 hr/day rehab nursing? {yes_no_potentially:3041433} Does  the patient require coordinated care of a physician, rehab nurse, PT, OT, and SLP to address physical and functional deficits in the context of the above medical diagnosis(es)? {yes_no_potentially:3041433} Addressing deficits in the following areas: {deficits:3041436} Can the patient actively participate in an intensive therapy program of at least 3 hrs of therapy 5 days a week? {yes_no_potentially:3041433} The potential for patient to make measurable gains while on inpatient rehab is {potential:3041437} Anticipated functional outcomes upon discharge from inpatient rehab: {functional outcomes:304600100} PT, {functional outcomes:304600100} OT, {functional outcomes:304600100} SLP Estimated rehab length of stay to reach the above functional goals is: *** Anticipated discharge destination: {anticipated dc setting:21604} 10. Overall Rehab/Functional Prognosis: {potential:3041437}   MD Signature: ***

## 2022-06-16 NOTE — Progress Notes (Signed)
Transfuse 1 unit platelet with unit number [id number],  A positive, with expiry date 06/19/22 2359, as the 2nd unit, from previous order of BT platelet 2 units

## 2022-06-16 NOTE — Progress Notes (Addendum)
PROGRESS NOTE    Devin Mercado  UKG:[id number] DOB: Feb 02, 1951 DOA: 05/27/2022 PCP: [name], MD     Brief Narrative:   Devin Mercado is a 71 year old male with past medical history significant for prostate cancer with bone mets, followed by oncology at [facility], anemia associated with bone marrow infiltration, pancytopenia from bone marrow suppression from Pluvicto.  Who presents to the hospital with hematuria.  He presented to his oncologist office, Foley catheter was placed and irrigated and was removed and sent home.  During the night, he woke up with pain and presented to the emergency department.  At that time, the Foley catheter was placed and bladder irrigation was started.  Patient admitted and urology consulted.  Status post CBI, Foley catheter removed 12/2, patient was transferred to [facility] 12/2 after code stroke was called as he noted to have right upper extremity weakness, with CT head significant for embolic CVA.  New events last 24 hours / Subjective:  He is afebrile over last 24 hours, diarrhea still present but improving  Assessment & Plan:   Principal Problem:   Hematuria Active Problems:   Hyperlipidemia   Essential hypertension   Atrioventricular block, complete (HCC)   PPM-[employer]   Prostate cancer (HCC)   Malignant neoplasm metastatic to bone [facility])   Acute urinary retention   Hyponatremia   Hypokalemia   Pancytopenia ([place])   Acute urinary retention with hematuria, hemorrhagic cystitis -Urology following -Continuous bladder irrigation discontinued 12/2. V -No further evidence of retention -Flomax  Acute CVA -Management per neurology team, felt to be secondary to embolic source -The echo with depressed EF to 30%. -TTE scheduled for Thursday -Maker interrogation showing A-fib -Not on antiplatelet anticoagulation therapy due to thrombocytopenia and anemia. . Paroxysmal A-fib -Not a candidate for anticoagulation -Per  cardiology, they are considering amiodarone  Acute systolic CHF -Compensated -2D echo with wall regional wall motion abnormalities -Further management per cardiology. -Consider Entresto and beta-blocker once out of permissive hypertension window . Acute blood loss anemia, pancytopenia, symptomatic anemia -Transfused 2 units platelets -Transfused 1 unit packed red blood cell -Continue to monitor CBC closely and transfuse as needed. -At least is low at 25 K today, likely will need transfusion over the next 24 to 48 hours to get his platelet count> 50 K to get her TEE on Thursday   Fever -Fever of 103 on 12/2 -Chest x-ray without acute cardiopulmonary process -UA was unremarkable.   -Negative GI panel -Cultures remain negative   Metastatic prostate cancer to bone, pancytopenia -Followed by oncology at [facility] -Prednisone   Hypertension -On valsartan, will hold to allow for permissive hypertension   Hyperlipidemia -Atorvastatin   Euthyroid sick syndrome -Elevated TSH but normal T4 -Check his TSH/free T4 prior to amiodarone initiation   Hyponatremia -Improving  Hypocalcemia  -Repleted  Hypokalemia -repelted , recheck in a.m.   DVT prophylaxis:  SCDs Start: 06/13/22 0135  Code Status: Full code Family Communication: Discussed with wife Dr. [name] by phone  Disposition Plan:  Status is: Inpatient Remains inpatient appropriate because: Giving blood transfusion today  Consults: Urology Cardiology ID  Antimicrobials:  Anti-infectives (From admission, onward)    Start     Dose/Rate Route Frequency Ordered Stop   06/14/22 0900  ciprofloxacin (CIPRO) tablet 500 mg  Status:  Discontinued        500 mg Oral 2 times daily 06/14/22 0813 06/15/22 0927        Objective: Vitals:   06/16/22 0023 06/16/22  0407 06/16/22 0900 06/16/22 1311  BP: (!) 154/86 (!) 144/76 (!) 146/74 (!) 155/86  Pulse: 92 88 99 91  Resp: '18  20 20  '$ Temp: 99 F (37.2 C) 99.6 F (37.6 C)  97.6 F (36.4 C) 98.8 F (37.1 C)  TempSrc: Oral Oral Oral Oral  SpO2: 99%  98% 99%  Weight:      Height:        Intake/Output Summary (Last 24 hours) at 06/16/2022 1318 Last data filed at 06/16/2022 1000 Gross per 24 hour  Intake 1727.37 ml  Output 150 ml  Net 1577.37 ml   Filed Weights   05/21/2022 1716 05/27/2022 2359  Weight: 102.1 kg 103 kg    Examination:   Awake Alert, Oriented X 3, frail Symmetrical Chest wall movement, Good air movement bilaterally, CTAB RRR,No Gallops,Rubs or new Murmurs, No Parasternal Heave +ve B.Sounds, Abd Soft, No tenderness, No rebound - guarding or rigidity. No Cyanosis, Clubbing or edema, No new Rash or bruise     Data Reviewed: I have personally reviewed following labs and imaging studies  CBC: Recent Labs  Lab 06/13/22 1901 06/14/22 0709 06/14/22 0844 06/14/22 2302 06/15/22 0814 06/16/22 0715  WBC 3.6*  --  2.4* 5.4 3.9* 3.7*  NEUTROABS 1.9  --  1.4*  --   --   --   HGB 8.1* 7.2* 7.3* 8.2* 7.7* 7.6*  HCT 24.6* 22.2* 22.5* 23.8* 23.0* 21.5*  MCV 85.4  --  86.2 82.4 82.4 80.2  PLT 80*  --  43* 41* 33* 25*   Basic Metabolic Panel: Recent Labs  Lab 05/27/2022 1720 06/13/22 1901 06/14/22 0254 06/15/22 0814 06/16/22 0715  NA 127* 125* 132* 130* 129*  K 4.4 4.3 4.0 3.5 3.2*  CL 96* 98 103 102 100  CO2 20* 18* 20* 18* 17*  GLUCOSE 94 142* 110* 109* 114*  BUN 36* 30* 28* 28* 32*  CREATININE 1.24 1.17 1.12 1.43* 1.18  CALCIUM 7.7* 6.9* 6.9* 6.4* 6.6*  MG  --  2.1  --   --   --    GFR: Estimated Creatinine Clearance: 69.2 mL/min (by C-G formula based on SCr of 1.18 mg/dL). Liver Function Tests: Recent Labs  Lab 06/09/2022 1720  AST 18  ALT 11  ALKPHOS 70  BILITOT 0.6  PROT 6.4*  ALBUMIN 3.6   Recent Labs  Lab 05/29/2022 1720  LIPASE 10*   No results for input(s): "AMMONIA" in the last 168 hours. Coagulation Profile: Recent Labs  Lab 06/13/22 1901  INR 1.7*   Cardiac Enzymes: No results for input(s): "CKTOTAL",  "CKMB", "CKMBINDEX", "TROPONINI" in the last 168 hours. BNP (last 3 results) No results for input(s): "PROBNP" in the last 8760 hours. HbA1C: No results for input(s): "HGBA1C" in the last 72 hours. CBG: Recent Labs  Lab 06/14/22 1056 06/14/22 1537  GLUCAP 121* 172*   Lipid Profile: No results for input(s): "CHOL", "HDL", "LDLCALC", "TRIG", "CHOLHDL", "LDLDIRECT" in the last 72 hours. Thyroid Function Tests: No results for input(s): "TSH", "T4TOTAL", "FREET4", "T3FREE", "THYROIDAB" in the last 72 hours.  Anemia Panel: No results for input(s): "VITAMINB12", "FOLATE", "FERRITIN", "TIBC", "IRON", "RETICCTPCT" in the last 72 hours. Sepsis Labs: Recent Labs  Lab 06/14/22 0254 06/15/22 0814 06/16/22 0715  PROCALCITON  --  14.08 7.37  LATICACIDVEN 1.0  --   --     Recent Results (from the past 240 hour(s))  Culture, blood (Routine X 2) w Reflex to ID Panel     Status: None (Preliminary result)  Collection Time: 06/13/22  7:01 PM   Specimen: BLOOD  Result Value Ref Range Status   Specimen Description   Final    BLOOD LEFT ANTECUBITAL Performed at [facility] [address] [place], [place] [address]    Special Requests   Final    BOTTLES DRAWN AEROBIC ONLY Blood Culture adequate volume Performed at [facility] [address] [place], [place] [address]    Culture   Final    NO GROWTH 2 DAYS Performed at [facility] [address], [place], [place] [address]    Report Status PENDING  Incomplete  Culture, blood (Routine X 2) w Reflex to ID Panel     Status: None (Preliminary result)   Collection Time: 06/13/22  7:04 PM   Specimen: BLOOD  Result Value Ref Range Status   Specimen Description   Final    BLOOD BLOOD RIGHT HAND Performed at [facility] [address], [place], [place] [address]    Special Requests   Final    BOTTLES DRAWN AEROBIC ONLY Blood Culture results may not be optimal due to an  inadequate volume of blood received in culture bottles Performed at [facility] [address], [place], [place] [address]    Culture   Final    NO GROWTH 2 DAYS Performed at [facility] [address] [place], [place] [address]    Report Status PENDING  Incomplete  Gastrointestinal Panel by PCR , Stool     Status: None   Collection Time: 06/16/22  4:01 AM   Specimen: Stool  Result Value Ref Range Status   Campylobacter species NOT DETECTED NOT DETECTED Final   Plesimonas shigelloides NOT DETECTED NOT DETECTED Final   Salmonella species NOT DETECTED NOT DETECTED Final   Yersinia enterocolitica NOT DETECTED NOT DETECTED Final   Vibrio species NOT DETECTED NOT DETECTED Final   Vibrio cholerae NOT DETECTED NOT DETECTED Final   Enteroaggregative E coli (EAEC) NOT DETECTED NOT DETECTED Final   Enteropathogenic E coli (EPEC) NOT DETECTED NOT DETECTED Final   Enterotoxigenic E coli (ETEC) NOT DETECTED NOT DETECTED Final   Shiga like toxin producing E coli (STEC) NOT DETECTED NOT DETECTED Final   Shigella/Enteroinvasive E coli (EIEC) NOT DETECTED NOT DETECTED Final   Cryptosporidium NOT DETECTED NOT DETECTED Final   Cyclospora cayetanensis NOT DETECTED NOT DETECTED Final   Entamoeba histolytica NOT DETECTED NOT DETECTED Final   Giardia lamblia NOT DETECTED NOT DETECTED Final   Adenovirus F40/41 NOT DETECTED NOT DETECTED Final   Astrovirus NOT DETECTED NOT DETECTED Final   Norovirus GI/GII NOT DETECTED NOT DETECTED Final   Rotavirus A NOT DETECTED NOT DETECTED Final   Sapovirus (I, II, IV, and V) NOT DETECTED NOT DETECTED Final    Comment: Performed at [facility], [address] [place], [place] [address]      Radiology Studies: CT HEAD WO CONTRAST (5MM)  Result Date: 06/15/2022 CLINICAL DATA:  Stroke follow-up EXAM: CT HEAD WITHOUT CONTRAST TECHNIQUE: Contiguous axial images were obtained from the base of the skull through the vertex without  intravenous contrast. RADIATION DOSE REDUCTION: This exam was performed according to the departmental dose-optimization program which includes automated exposure control, adjustment of the mA and/or kV according to patient size and/or use of iterative reconstruction technique. COMPARISON:  06/14/2022 FINDINGS: Brain: There are areas of loss of gray-white differentiation in the left occipital lobe and right parietal lobe, unchanged. No  acute hemorrhage. Mild generalized volume loss. There is periventricular hypoattenuation compatible with chronic microvascular disease. Incidentally noted cavum septum pellucidum et vergae. Vascular: No hyperdense vessel or unexpected calcification. Skull: Normal. Negative for fracture or focal lesion. Sinuses/Orbits: No acute finding. Other: None. IMPRESSION: Unchanged areas of loss of gray-white differentiation in the left occipital lobe and right parietal lobe, consistent with acute/subacute infarcts. No acute hemorrhage. Electronically Signed   By: Ulyses Jarred M.D.   On: 06/15/2022 21:54   ECHOCARDIOGRAM COMPLETE  Result Date: 06/15/2022    ECHOCARDIOGRAM REPORT   Patient Name:   Devin Mercado [facility] Date of Exam: 06/15/2022 Medical Rec #:  [id number]        Height:       72.0 in Accession #:    [id number]       Weight:       227.1 lb Date of Birth:  March 17, 1951         BSA:          2.249 m Patient Age:    65 years         BP:           120/59 mmHg Patient Gender: M                HR:           88 bpm. Exam Location:  Inpatient Procedure: 2D Echo, Color Doppler and Cardiac Doppler Indications:    Stroke i63.9  History:        Patient has no prior history of Echocardiogram examinations.                 Pacemaker; Risk Factors:Hypertension and Dyslipidemia.  Sonographer:    Raquel Sarna Senior RDCS Referring Phys: [phone] [facility]  Sonographer Comments: Technically difficult due to poor echo windows. IMPRESSIONS  1. Difficult acoustic windows limit study Would recomm limited echo with  Definity to confirm LVEF and wall motion. There appears to be hypokinesis of the mid/distal lateral, mid/distal septal, distal inferior walls and apical akinesis.. Left ventricular  ejection fraction, by estimation, is 30%. The left ventricle has moderately decreased function.  2. Right ventricular systolic function is normal. The right ventricular size is normal. There is normal pulmonary artery systolic pressure.  3. The mitral valve is normal in structure. Trivial mitral valve regurgitation.  4. The aortic valve is normal in structure. Aortic valve regurgitation is not visualized.  5. There is mild dilatation of the ascending aorta, measuring 42 mm.  6. The inferior vena cava is normal in size with greater than 50% respiratory variability, suggesting right atrial pressure of 3 mmHg. FINDINGS  Left Ventricle: Difficult acoustic windows limit study Would recomm limited echo with Definity to confirm LVEF and wall motion. There appears to be hypokinesis of the mid/distal lateral, mid/distal septal, distal inferior walls and apical akinesis. Left  ventricular ejection fraction, by estimation, is 30%. The left ventricle has moderately decreased function. The left ventricular internal cavity size was normal in size. There is no left ventricular hypertrophy. Right Ventricle: The right ventricular size is normal. Right vetricular wall thickness was not assessed. Right ventricular systolic function is normal. There is normal pulmonary artery systolic pressure. The tricuspid regurgitant velocity is 2.31 m/s, and with an assumed right atrial pressure of 3 mmHg, the estimated right ventricular systolic pressure is 38.4 mmHg. Left Atrium: Left atrial size was normal in size. Right Atrium: Right atrial size was normal in size. Pericardium: Trivial pericardial effusion is present. Mitral  Valve: The mitral valve is normal in structure. Trivial mitral valve regurgitation. Tricuspid Valve: The tricuspid valve is grossly normal.  Tricuspid valve regurgitation is mild. Aortic Valve: The aortic valve is normal in structure. Aortic valve regurgitation is not visualized. Pulmonic Valve: The pulmonic valve was not well visualized. Pulmonic valve regurgitation is not visualized. No evidence of pulmonic stenosis. Aorta: There is mild dilatation of the ascending aorta, measuring 42 mm. Venous: The inferior vena cava is normal in size with greater than 50% respiratory variability, suggesting right atrial pressure of 3 mmHg. IAS/Shunts: No atrial level shunt detected by color flow Doppler. Additional Comments: A device lead is visualized.  LEFT VENTRICLE PLAX 2D LVIDd:         5.50 cm LVIDs:         3.30 cm LV PW:         1.10 cm LV IVS:        1.20 cm LVOT diam:     2.40 cm LV SV:         85 LV SV Index:   38 LVOT Area:     4.52 cm  LV Volumes (MOD) LV vol d, MOD A2C: 112.0 ml LV vol d, MOD A4C: 139.0 ml LV vol s, MOD A2C: 65.0 ml LV vol s, MOD A4C: 72.7 ml LV SV MOD A2C:     47.0 ml LV SV MOD A4C:     139.0 ml LV SV MOD BP:      56.6 ml RIGHT VENTRICLE RV S prime:     17.70 cm/s TAPSE (M-mode): 2.2 cm LEFT ATRIUM             Index        RIGHT ATRIUM           Index LA diam:        3.60 cm 1.60 cm/m   RA Area:     21.10 cm LA Vol (A2C):   66.4 ml 29.53 ml/m  RA Volume:   60.60 ml  26.95 ml/m LA Vol (A4C):   63.5 ml 28.24 ml/m LA Biplane Vol: 65.8 ml 29.26 ml/m  AORTIC VALVE LVOT Vmax:   102.00 cm/s LVOT Vmean:  71.100 cm/s LVOT VTI:    0.188 m  AORTA Ao Root diam: 4.00 cm Ao Asc diam:  4.20 cm TRICUSPID VALVE TR Peak grad:   21.3 mmHg TR Vmax:        231.00 cm/s  SHUNTS Systemic VTI:  0.19 m Systemic Diam: 2.40 cm Dorris Carnes MD Electronically signed by Dorris Carnes MD Signature Date/Time: 06/15/2022/4:20:16 PM    Final    CT ANGIO HEAD NECK W WO CM W PERF (CODE STROKE)  Result Date: 06/14/2022 CLINICAL DATA:  Neuro deficit, acute, stroke suspected. Right-sided weakness. EXAM: CT ANGIOGRAPHY HEAD AND NECK TECHNIQUE: Multidetector CT imaging  of the head and neck was performed using the standard protocol during bolus administration of intravenous contrast. Multiplanar CT image reconstructions and MIPs were obtained to evaluate the vascular anatomy. Carotid stenosis measurements (when applicable) are obtained utilizing NASCET criteria, using the distal internal carotid diameter as the denominator. RADIATION DOSE REDUCTION: This exam was performed according to the departmental dose-optimization program which includes automated exposure control, adjustment of the mA and/or kV according to patient size and/or use of iterative reconstruction technique. CONTRAST:  168m OMNIPAQUE IOHEXOL 350 MG/ML SOLN COMPARISON:  CT head without contrast 06/14/2022 at 2:50 p.m. FINDINGS: CTA NECK FINDINGS Aortic arch: Minimal atherosclerotic changes are present the  distal aorta. Three vessel arch configuration is present. No significant stenosis is present at the great vessel origins. Right carotid system: Right common carotid artery is within normal limits. Minimal calcifications are present proximal right ICA without significant stenosis. Mild tortuosity is present in the distal cervical right ICA without significant stenosis. Left carotid system: The left common carotid artery is within normal limits. Bifurcation is unremarkable. Mild tortuosity is present cervical left ICA without significant stenosis. Vertebral arteries: The left vertebral artery is the dominant vessel. Both vertebral arteries originate from the subclavian arteries without significant stenosis. No significant stenosis is present in either vertebral artery in the neck. Skeleton: Multiple sclerotic lesions are present throughout the cervical and upper thoracic spine. Diffuse vertebral body involvement is present at T2 and T3. Asymmetric right-sided involvement is present at T1 and T2. No pathologic fractures are present. Sclerotic lesions are present within ribs bilaterally. Other neck: Soft tissues the  neck are otherwise unremarkable. Salivary glands are within normal limits. Thyroid is normal. No significant adenopathy is present. No focal mucosal or submucosal lesions are present. Upper chest: The lung apices are clear. Review of the MIP images confirms the above findings CTA HEAD FINDINGS Anterior circulation: Minimal atherosclerotic changes are present within the cavernous internal carotid arteries bilaterally. The A1 and M1 segments are normal. The anterior communicating artery is patent. MCA bifurcations are within normal limits bilaterally. The ACA and MCA branch vessels are within normal limits bilaterally. Posterior circulation: The left vertebral artery is dominant vessel. PICA origins are visualized and normal. Vertebrobasilar junction and basilar artery is normal. Both posterior cerebral arteries originate from basilar tip. The PCA branch vessels are normal. Venous sinuses: The dural sinuses are patent. The straight sinus and deep cerebral veins are intact. Cortical veins are within normal limits. No significant vascular malformation is evident. Anatomic variants: None Review of the MIP images confirms the above findings IMPRESSION: 1. No emergent large vessel occlusion. 2. Minimal atherosclerotic changes within the proximal right ICA and cavernous internal carotid arteries bilaterally without significant stenosis. 3. Multiple sclerotic lesions throughout the cervical and upper thoracic spine and ribs bilaterally consistent with metastatic disease. No pathologic fractures are present. Electronically Signed   By: San Morelle M.D.   On: 06/14/2022 18:26   CT HEAD WO CONTRAST (5MM)  Result Date: 06/14/2022 CLINICAL DATA:  Abnormal CT of the head. Delayed images were obtained after CTA head to evaluate for possible metastatic disease. EXAM: CT HEAD WITHOUT CONTRAST TECHNIQUE: Contiguous axial images were obtained from the base of the skull through the vertex without intravenous contrast.  RADIATION DOSE REDUCTION: This exam was performed according to the departmental dose-optimization program which includes automated exposure control, adjustment of the mA and/or kV according to patient size and/or use of iterative reconstruction technique. COMPARISON:  CT head without contrast 06/14/2022 at 2:50 p.m. FINDINGS: Brain: Delayed images demonstrate no pathologic enhancement. Infarcts involving the posterior right parietal lobe and left occipital lobe are again seen. Central left cerebellar infarct is better visualized. Mild atrophy and white matter changes are stable. No significant interval change is present. Vascular: Normal intravascular enhancement is present. Skull: Calvarium is intact. No focal lytic or blastic lesions are present. No significant extracranial soft tissue lesion is present. Sinuses/Orbits: The paranasal sinuses and mastoid air cells are clear. The globes and orbits are within normal limits. IMPRESSION: 1. No pathologic enhancement to suggest metastatic disease. 2. Stable infarcts involving the posterior right parietal lobe and left occipital lobe. 3. Central left  cerebellar infarct is better visualized. 4. Stable atrophy and white matter disease. This likely reflects the sequela of chronic microvascular ischemia. Electronically Signed   By: San Morelle M.D.   On: 06/14/2022 18:14   CT HEAD CODE STROKE WO CONTRAST  Result Date: 06/14/2022 CLINICAL DATA:  Code stroke. Neuro deficit, acute, stroke suspected. Right-sided weakness. EXAM: CT HEAD WITHOUT CONTRAST TECHNIQUE: Contiguous axial images were obtained from the base of the skull through the vertex without intravenous contrast. RADIATION DOSE REDUCTION: This exam was performed according to the departmental dose-optimization program which includes automated exposure control, adjustment of the mA and/or kV according to patient size and/or use of iterative reconstruction technique. COMPARISON:  . FINDINGS: Brain: Focal  hypoattenuation is present in the posterior right parietal lobe measuring up to 2.5 cm. Focal cortical hypoattenuation is also present in the posterior left occipital lobe on image 12 series 3 and image 41 of series 7. No other acute or focal cortical abnormalities are present. Deep brain nuclei are within normal limits. Insular ribbon is normal bilaterally. Cavum septum pellucidum noted. Ventricles are otherwise within normal limits. No significant extraaxial fluid collection is present. The brainstem and cerebellum are within normal limits. Vascular: No hyperdense vessel or unexpected calcification. Skull: Calvarium is intact. No focal lytic or blastic lesions are present. No significant extracranial soft tissue lesion is present. Sinuses/Orbits: The paranasal sinuses and mastoid air cells are clear. The globes and orbits are within normal limits. ASPECTS [facility] Stroke Program Early CT Score) - Ganglionic level infarction (caudate, lentiform nuclei, internal capsule, insula, M1-M3 cortex): 6/7 - Supraganglionic infarction (M4-M6 cortex): 3/3 Total score (0-10 with 10 being normal): 9/10 IMPRESSION: 1. Focal hypoattenuation in the posterior right parietal lobe measuring up to 2.5 cm. This is concerning for acute or subacute infarct. 2. Focal cortical hypoattenuation in the posterior left occipital lobe is also concerning for acute or subacute infarct. 3. No other acute or focal cortical abnormalities. 4. Aspects is 9/10. The above was relayed via text pager to Dr. Lyn Records on 06/14/2022 at 15:08. Page Electronically Signed   By: San Morelle M.D.   On: 06/14/2022 15:09      Scheduled Meds:  atorvastatin  20 mg Oral Daily   famotidine  20 mg Oral Daily   lidocaine  1 patch Transdermal Q24H   predniSONE  10 mg Oral q morning   sodium bicarbonate  650 mg Oral TID   tamsulosin  0.4 mg Oral QPC supper   Continuous Infusions:  sodium chloride 50 mL/hr at 06/16/22 0415     LOS: 4 days      Phillips Climes, MD [facility] 06/16/2022, 1:18 PM   Available via Epic secure chat 7am-7pm After these hours, please refer to coverage provider listed on [email or web address]

## 2022-06-16 NOTE — Progress Notes (Addendum)
Electrophysiology Rounding Note  Patient Name: Devin Mercado Date of Encounter: 06/16/2022  Primary Cardiologist: Cristopher Peru, MD Electrophysiologist: None   Subjective   No new complaints this am.   Inpatient Medications    Scheduled Meds:  atorvastatin  20 mg Oral Daily   famotidine  20 mg Oral Daily   lidocaine  1 patch Transdermal Q24H   predniSONE  10 mg Oral q morning   sodium bicarbonate  650 mg Oral TID   tamsulosin  0.4 mg Oral QPC supper   Continuous Infusions:  sodium chloride 50 mL/hr at 06/16/22 0415   PRN Meds: acetaminophen **OR** acetaminophen, LORazepam, morphine injection, oxyCODONE, senna-docusate, traMADol   Vital Signs    Vitals:   06/15/22 1701 06/15/22 2046 06/16/22 0023 06/16/22 0407  BP: (!) 132/56 121/73 (!) 154/86 (!) 144/76  Pulse: 93  92 88  Resp: '16 18 18   '$ Temp: 99.3 F (37.4 C) 99.2 F (37.3 C) 99 F (37.2 C) 99.6 F (37.6 C)  TempSrc:  Oral Oral Oral  SpO2: 98% 97% 99%   Weight:      Height:        Intake/Output Summary (Last 24 hours) at 06/16/2022 0803 Last data filed at 06/16/2022 0031 Gross per 24 hour  Intake 1487.37 ml  Output 150 ml  Net 1337.37 ml   Filed Weights   06/09/2022 1716 05/21/2022 2359  Weight: 102.1 kg 103 kg    Physical Exam    GEN- The patient is well appearing, alert and oriented x 3 today.   HEENT- No gross abnormality.  Lungs- Clear to ausculation bilaterally, normal work of breathing Heart- Regular rate and rhythm, no murmurs, rubs or gallops GI- soft, NT, ND, + BS Extremities- no clubbing or cyanosis. No edema Neuro- No obvious focal abnormality.   Labs    CBC Recent Labs    06/13/22 1901 06/14/22 0709 06/14/22 0844 06/14/22 2302 06/15/22 0814  WBC 3.6*  --  2.4* 5.4 3.9*  NEUTROABS 1.9  --  1.4*  --   --   HGB 8.1*   < > 7.3* 8.2* 7.7*  HCT 24.6*   < > 22.5* 23.8* 23.0*  MCV 85.4  --  86.2 82.4 82.4  PLT 80*  --  43* 41* 33*   < > = values in this interval not displayed.    Basic Metabolic Panel Recent Labs    06/13/22 1901 06/14/22 0254 06/15/22 0814  NA 125* 132* 130*  K 4.3 4.0 3.5  CL 98 103 102  CO2 18* 20* 18*  GLUCOSE 142* 110* 109*  BUN 30* 28* 28*  CREATININE 1.17 1.12 1.43*  CALCIUM 6.9* 6.9* 6.4*  MG 2.1  --   --    Liver Function Tests No results for input(s): "AST", "ALT", "ALKPHOS", "BILITOT", "PROT", "ALBUMIN" in the last 72 hours. No results for input(s): "LIPASE", "AMYLASE" in the last 72 hours. Cardiac Enzymes No results for input(s): "CKTOTAL", "CKMB", "CKMBINDEX", "TROPONINI" in the last 72 hours.   Telemetry    AS-VP 80-90s (personally reviewed)  Radiology    CT HEAD WO CONTRAST (5MM)  Result Date: 06/15/2022 CLINICAL DATA:  Stroke follow-up EXAM: CT HEAD WITHOUT CONTRAST TECHNIQUE: Contiguous axial images were obtained from the base of the skull through the vertex without intravenous contrast. RADIATION DOSE REDUCTION: This exam was performed according to the departmental dose-optimization program which includes automated exposure control, adjustment of the mA and/or kV according to patient size and/or use of iterative reconstruction  technique. COMPARISON:  06/14/2022 FINDINGS: Brain: There are areas of loss of gray-white differentiation in the left occipital lobe and right parietal lobe, unchanged. No acute hemorrhage. Mild generalized volume loss. There is periventricular hypoattenuation compatible with chronic microvascular disease. Incidentally noted cavum septum pellucidum et vergae. Vascular: No hyperdense vessel or unexpected calcification. Skull: Normal. Negative for fracture or focal lesion. Sinuses/Orbits: No acute finding. Other: None. IMPRESSION: Unchanged areas of loss of gray-white differentiation in the left occipital lobe and right parietal lobe, consistent with acute/subacute infarcts. No acute hemorrhage. Electronically Signed   By: Ulyses Jarred M.D.   On: 06/15/2022 21:54   ECHOCARDIOGRAM COMPLETE  Result  Date: 06/15/2022    ECHOCARDIOGRAM REPORT   Patient Name:   Devin Mercado [facility] Date of Exam: 06/15/2022 Medical Rec #:  [id number]        Height:       72.0 in Accession #:    [id number]       Weight:       227.1 lb Date of Birth:  1950/12/01         BSA:          2.249 m Patient Age:    71 years         BP:           120/59 mmHg Patient Gender: M                HR:           88 bpm. Exam Location:  Inpatient Procedure: 2D Echo, Color Doppler and Cardiac Doppler Indications:    Stroke i63.9  History:        Patient has no prior history of Echocardiogram examinations.                 Pacemaker; Risk Factors:Hypertension and Dyslipidemia.  Sonographer:    Raquel Sarna Senior RDCS Referring Phys: [phone] [facility]  Sonographer Comments: Technically difficult due to poor echo windows. IMPRESSIONS  1. Difficult acoustic windows limit study Would recomm limited echo with Definity to confirm LVEF and wall motion. There appears to be hypokinesis of the mid/distal lateral, mid/distal septal, distal inferior walls and apical akinesis.. Left ventricular  ejection fraction, by estimation, is 30%. The left ventricle has moderately decreased function.  2. Right ventricular systolic function is normal. The right ventricular size is normal. There is normal pulmonary artery systolic pressure.  3. The mitral valve is normal in structure. Trivial mitral valve regurgitation.  4. The aortic valve is normal in structure. Aortic valve regurgitation is not visualized.  5. There is mild dilatation of the ascending aorta, measuring 42 mm.  6. The inferior vena cava is normal in size with greater than 50% respiratory variability, suggesting right atrial pressure of 3 mmHg. FINDINGS  Left Ventricle: Difficult acoustic windows limit study Would recomm limited echo with Definity to confirm LVEF and wall motion. There appears to be hypokinesis of the mid/distal lateral, mid/distal septal, distal inferior walls and apical akinesis. Left  ventricular  ejection fraction, by estimation, is 30%. The left ventricle has moderately decreased function. The left ventricular internal cavity size was normal in size. There is no left ventricular hypertrophy. Right Ventricle: The right ventricular size is normal. Right vetricular wall thickness was not assessed. Right ventricular systolic function is normal. There is normal pulmonary artery systolic pressure. The tricuspid regurgitant velocity is 2.31 m/s, and with an assumed right atrial pressure of 3 mmHg, the estimated right ventricular systolic pressure is 24.3  mmHg. Left Atrium: Left atrial size was normal in size. Right Atrium: Right atrial size was normal in size. Pericardium: Trivial pericardial effusion is present. Mitral Valve: The mitral valve is normal in structure. Trivial mitral valve regurgitation. Tricuspid Valve: The tricuspid valve is grossly normal. Tricuspid valve regurgitation is mild. Aortic Valve: The aortic valve is normal in structure. Aortic valve regurgitation is not visualized. Pulmonic Valve: The pulmonic valve was not well visualized. Pulmonic valve regurgitation is not visualized. No evidence of pulmonic stenosis. Aorta: There is mild dilatation of the ascending aorta, measuring 42 mm. Venous: The inferior vena cava is normal in size with greater than 50% respiratory variability, suggesting right atrial pressure of 3 mmHg. IAS/Shunts: No atrial level shunt detected by color flow Doppler. Additional Comments: A device lead is visualized.  LEFT VENTRICLE PLAX 2D LVIDd:         5.50 cm LVIDs:         3.30 cm LV PW:         1.10 cm LV IVS:        1.20 cm LVOT diam:     2.40 cm LV SV:         85 LV SV Index:   38 LVOT Area:     4.52 cm  LV Volumes (MOD) LV vol d, MOD A2C: 112.0 ml LV vol d, MOD A4C: 139.0 ml LV vol s, MOD A2C: 65.0 ml LV vol s, MOD A4C: 72.7 ml LV SV MOD A2C:     47.0 ml LV SV MOD A4C:     139.0 ml LV SV MOD BP:      56.6 ml RIGHT VENTRICLE RV S prime:     17.70 cm/s TAPSE  (M-mode): 2.2 cm LEFT ATRIUM             Index        RIGHT ATRIUM           Index LA diam:        3.60 cm 1.60 cm/m   RA Area:     21.10 cm LA Vol (A2C):   66.4 ml 29.53 ml/m  RA Volume:   60.60 ml  26.95 ml/m LA Vol (A4C):   63.5 ml 28.24 ml/m LA Biplane Vol: 65.8 ml 29.26 ml/m  AORTIC VALVE LVOT Vmax:   102.00 cm/s LVOT Vmean:  71.100 cm/s LVOT VTI:    0.188 m  AORTA Ao Root diam: 4.00 cm Ao Asc diam:  4.20 cm TRICUSPID VALVE TR Peak grad:   21.3 mmHg TR Vmax:        231.00 cm/s  SHUNTS Systemic VTI:  0.19 m Systemic Diam: 2.40 cm Dorris Carnes MD Electronically signed by Dorris Carnes MD Signature Date/Time: 06/15/2022/4:20:16 PM    Final    CT ANGIO HEAD NECK W WO CM W PERF (CODE STROKE)  Result Date: 06/14/2022 CLINICAL DATA:  Neuro deficit, acute, stroke suspected. Right-sided weakness. EXAM: CT ANGIOGRAPHY HEAD AND NECK TECHNIQUE: Multidetector CT imaging of the head and neck was performed using the standard protocol during bolus administration of intravenous contrast. Multiplanar CT image reconstructions and MIPs were obtained to evaluate the vascular anatomy. Carotid stenosis measurements (when applicable) are obtained utilizing NASCET criteria, using the distal internal carotid diameter as the denominator. RADIATION DOSE REDUCTION: This exam was performed according to the departmental dose-optimization program which includes automated exposure control, adjustment of the mA and/or kV according to patient size and/or use of iterative reconstruction technique. CONTRAST:  165m OMNIPAQUE IOHEXOL  350 MG/ML SOLN COMPARISON:  CT head without contrast 06/14/2022 at 2:50 p.m. FINDINGS: CTA NECK FINDINGS Aortic arch: Minimal atherosclerotic changes are present the distal aorta. Three vessel arch configuration is present. No significant stenosis is present at the great vessel origins. Right carotid system: Right common carotid artery is within normal limits. Minimal calcifications are present proximal right ICA  without significant stenosis. Mild tortuosity is present in the distal cervical right ICA without significant stenosis. Left carotid system: The left common carotid artery is within normal limits. Bifurcation is unremarkable. Mild tortuosity is present cervical left ICA without significant stenosis. Vertebral arteries: The left vertebral artery is the dominant vessel. Both vertebral arteries originate from the subclavian arteries without significant stenosis. No significant stenosis is present in either vertebral artery in the neck. Skeleton: Multiple sclerotic lesions are present throughout the cervical and upper thoracic spine. Diffuse vertebral body involvement is present at T2 and T3. Asymmetric right-sided involvement is present at T1 and T2. No pathologic fractures are present. Sclerotic lesions are present within ribs bilaterally. Other neck: Soft tissues the neck are otherwise unremarkable. Salivary glands are within normal limits. Thyroid is normal. No significant adenopathy is present. No focal mucosal or submucosal lesions are present. Upper chest: The lung apices are clear. Review of the MIP images confirms the above findings CTA HEAD FINDINGS Anterior circulation: Minimal atherosclerotic changes are present within the cavernous internal carotid arteries bilaterally. The A1 and M1 segments are normal. The anterior communicating artery is patent. MCA bifurcations are within normal limits bilaterally. The ACA and MCA branch vessels are within normal limits bilaterally. Posterior circulation: The left vertebral artery is dominant vessel. PICA origins are visualized and normal. Vertebrobasilar junction and basilar artery is normal. Both posterior cerebral arteries originate from basilar tip. The PCA branch vessels are normal. Venous sinuses: The dural sinuses are patent. The straight sinus and deep cerebral veins are intact. Cortical veins are within normal limits. No significant vascular malformation is  evident. Anatomic variants: None Review of the MIP images confirms the above findings IMPRESSION: 1. No emergent large vessel occlusion. 2. Minimal atherosclerotic changes within the proximal right ICA and cavernous internal carotid arteries bilaterally without significant stenosis. 3. Multiple sclerotic lesions throughout the cervical and upper thoracic spine and ribs bilaterally consistent with metastatic disease. No pathologic fractures are present. Electronically Signed   By: San Morelle M.D.   On: 06/14/2022 18:26   CT HEAD WO CONTRAST (5MM)  Result Date: 06/14/2022 CLINICAL DATA:  Abnormal CT of the head. Delayed images were obtained after CTA head to evaluate for possible metastatic disease. EXAM: CT HEAD WITHOUT CONTRAST TECHNIQUE: Contiguous axial images were obtained from the base of the skull through the vertex without intravenous contrast. RADIATION DOSE REDUCTION: This exam was performed according to the departmental dose-optimization program which includes automated exposure control, adjustment of the mA and/or kV according to patient size and/or use of iterative reconstruction technique. COMPARISON:  CT head without contrast 06/14/2022 at 2:50 p.m. FINDINGS: Brain: Delayed images demonstrate no pathologic enhancement. Infarcts involving the posterior right parietal lobe and left occipital lobe are again seen. Central left cerebellar infarct is better visualized. Mild atrophy and white matter changes are stable. No significant interval change is present. Vascular: Normal intravascular enhancement is present. Skull: Calvarium is intact. No focal lytic or blastic lesions are present. No significant extracranial soft tissue lesion is present. Sinuses/Orbits: The paranasal sinuses and mastoid air cells are clear. The globes and orbits are within normal limits.  IMPRESSION: 1. No pathologic enhancement to suggest metastatic disease. 2. Stable infarcts involving the posterior right parietal lobe  and left occipital lobe. 3. Central left cerebellar infarct is better visualized. 4. Stable atrophy and white matter disease. This likely reflects the sequela of chronic microvascular ischemia. Electronically Signed   By: San Morelle M.D.   On: 06/14/2022 18:14   CT HEAD CODE STROKE WO CONTRAST  Result Date: 06/14/2022 CLINICAL DATA:  Code stroke. Neuro deficit, acute, stroke suspected. Right-sided weakness. EXAM: CT HEAD WITHOUT CONTRAST TECHNIQUE: Contiguous axial images were obtained from the base of the skull through the vertex without intravenous contrast. RADIATION DOSE REDUCTION: This exam was performed according to the departmental dose-optimization program which includes automated exposure control, adjustment of the mA and/or kV according to patient size and/or use of iterative reconstruction technique. COMPARISON:  . FINDINGS: Brain: Focal hypoattenuation is present in the posterior right parietal lobe measuring up to 2.5 cm. Focal cortical hypoattenuation is also present in the posterior left occipital lobe on image 12 series 3 and image 41 of series 7. No other acute or focal cortical abnormalities are present. Deep brain nuclei are within normal limits. Insular ribbon is normal bilaterally. Cavum septum pellucidum noted. Ventricles are otherwise within normal limits. No significant extraaxial fluid collection is present. The brainstem and cerebellum are within normal limits. Vascular: No hyperdense vessel or unexpected calcification. Skull: Calvarium is intact. No focal lytic or blastic lesions are present. No significant extracranial soft tissue lesion is present. Sinuses/Orbits: The paranasal sinuses and mastoid air cells are clear. The globes and orbits are within normal limits. ASPECTS [facility] Stroke Program Early CT Score) - Ganglionic level infarction (caudate, lentiform nuclei, internal capsule, insula, M1-M3 cortex): 6/7 - Supraganglionic infarction (M4-M6 cortex): 3/3 Total score  (0-10 with 10 being normal): 9/10 IMPRESSION: 1. Focal hypoattenuation in the posterior right parietal lobe measuring up to 2.5 cm. This is concerning for acute or subacute infarct. 2. Focal cortical hypoattenuation in the posterior left occipital lobe is also concerning for acute or subacute infarct. 3. No other acute or focal cortical abnormalities. 4. Aspects is 9/10. The above was relayed via text pager to Dr. Lyn Records on 06/14/2022 at 15:08. Page Electronically Signed   By: San Morelle M.D.   On: 06/14/2022 15:09    Patient Profile     PHYLLIS WHITEFIELD is a 71 y.o. male with a hx of CHB, s/p PPM who is being seen 06/15/2022 for the evaluation of acute stroke to have his PPM interrogated at the request of Dr. Waldron Labs.   Assessment & Plan    PAF -  PPM interrogation demonstrated 16 hr episode of AF which is likely cause of CVA CHA2DS2/VASc is at least 4 Urology left a note today saying pt "will remain at high risk for urologic bleeding requiring transfusion if anticoagulated due to radiation cystitis and thrombocytopenia/anemia." Will discuss with Dr. Lovena Le. If he cannot be anti-coagulated we would recommend amiodarone 200 mg bid. 2. CHB  PPM functioning normally  3. HTN Stable on current regimen   4. Fever -  Blood cultures negative this admit.  With indwelling device, reasonable to obtain TEE to assess valves/device   5. Thrombocytopenia PLT  25* (12/04 0715) HGB  7.6* (12/04 0715) Unlikely to tolerate Galliano at this juncture.   For questions or updates, please contact [place] Please consult [email or web address] for contact info under Cardiology/STEMI.  Signed, Shirley Friar, PA-C  06/16/2022, 8:03 AM   EP attending  Patient seen and examined.  Agree with the findings as noted above.  The patient has had a stable post stroke course.  He appears to remain in sinus rhythm with ventricular pacing.  His platelet count has decreased.  I have discussed the  situation with multiple colleagues.  There are several issues. Assessment and plan 1.  Paroxysmal atrial fibrillation -he is asymptomatic from this.  Unfortunately we do not have a good way to protect him from additional strokes.  For this reason I would recommend amiodarone 200 mg twice a day in hopes of minimizing or eliminating his atrial fibrillation at least in the short-term.  Long-term, if his platelet count improves and his urologic problems stabilize, we could consider a trial of systemic anticoagulation followed by watchman followed by brief additional anticoagulation before discontinuation of systemic anticoagulation. 2.  New onset left ventricular dysfunction -he does not have overt heart failure symptoms at this time.  He denies anginal symptoms.  With all that is going on I would not recommend left heart catheterization.  Depending on his other medical problems, he would be a candidate at some point in time for upgrade to a biventricular pacemaker.  For now would recommend medical therapy for left ventricular dysfunction with low-dose beta-blocker and consider low-dose Entresto. 3.  Fever of unknown origin -note and agree with plans for transesophageal echocardiography.  Cristopher Peru, MD

## 2022-06-17 DIAGNOSIS — I63432 Cerebral infarction due to embolism of left posterior cerebral artery: Secondary | ICD-10-CM | POA: Diagnosis not present

## 2022-06-17 DIAGNOSIS — D61818 Other pancytopenia: Secondary | ICD-10-CM | POA: Diagnosis not present

## 2022-06-17 DIAGNOSIS — R310 Gross hematuria: Secondary | ICD-10-CM | POA: Diagnosis not present

## 2022-06-17 DIAGNOSIS — R509 Fever, unspecified: Secondary | ICD-10-CM | POA: Diagnosis not present

## 2022-06-17 DIAGNOSIS — R338 Other retention of urine: Secondary | ICD-10-CM | POA: Diagnosis not present

## 2022-06-17 LAB — BPAM PLATELET PHERESIS
Blood Product Expiration Date: 202312062359
Blood Product Expiration Date: 202312072359
ISSUE DATE / TIME: 202312041639
ISSUE DATE / TIME: 202312041956
Unit Type and Rh: 6200
Unit Type and Rh: 7300

## 2022-06-17 LAB — CBC
HCT: 23.1 % — ABNORMAL LOW (ref 39.0–52.0)
Hemoglobin: 8.3 g/dL — ABNORMAL LOW (ref 13.0–17.0)
MCH: 29 pg (ref 26.0–34.0)
MCHC: 35.9 g/dL (ref 30.0–36.0)
MCV: 80.8 fL (ref 80.0–100.0)
Platelets: 44 10*3/uL — ABNORMAL LOW (ref 150–400)
RBC: 2.86 MIL/uL — ABNORMAL LOW (ref 4.22–5.81)
RDW: 17 % — ABNORMAL HIGH (ref 11.5–15.5)
WBC: 3.5 10*3/uL — ABNORMAL LOW (ref 4.0–10.5)
nRBC: 0 % (ref 0.0–0.2)

## 2022-06-17 LAB — PREPARE PLATELET PHERESIS
Unit division: 0
Unit division: 0

## 2022-06-17 LAB — BASIC METABOLIC PANEL
Anion gap: 9 (ref 5–15)
BUN: 32 mg/dL — ABNORMAL HIGH (ref 8–23)
CO2: 18 mmol/L — ABNORMAL LOW (ref 22–32)
Calcium: 7.1 mg/dL — ABNORMAL LOW (ref 8.9–10.3)
Chloride: 107 mmol/L (ref 98–111)
Creatinine, Ser: 1.11 mg/dL (ref 0.61–1.24)
GFR, Estimated: >60 mL/min (ref >60)
Glucose, Bld: 102 mg/dL — ABNORMAL HIGH (ref 70–99)
Potassium: 4.1 mmol/L (ref 3.5–5.1)
Sodium: 134 mmol/L — ABNORMAL LOW (ref 135–145)

## 2022-06-17 LAB — PROCALCITONIN: Procalcitonin: 4 ng/mL

## 2022-06-17 LAB — PREPARE RBC (CROSSMATCH)

## 2022-06-17 MED ORDER — AMIODARONE HCL 200 MG PO TABS
200.0000 mg | ORAL_TABLET | Freq: Two times a day (BID) | ORAL | Status: DC
  Administered 2022-06-17 – 2022-06-22 (×12): 200 mg via ORAL
  Filled 2022-06-17 (×12): qty 1

## 2022-06-17 NOTE — H&P (Incomplete)
Physical Medicine and Rehabilitation Admission H&P    CC:   HPI: Devin Mercado is a 71 year old male who presented to [facility] ED with hematuria undergoing treatment for prostate cancer. He was treated day of admission with bladder irrigation at [facility] but resumed passing blood clots and retaining urine once at home.  ROS Past Medical History:  Diagnosis Date   Arthritis    OA AND PAIN LEFT HIP   CHB (complete heart block) ([place])    a. MDT dual chamber pacemaker   HTN (hypertension)    Hyperlipidemia    Presence of permanent cardiac pacemaker    2000   Prostate cancer ([place]) 09/2014   Past Surgical History:  Procedure Laterality Date   ADENOIDECTOMY AS A CHILD     COLONOSCOPY WITH PROPOFOL N/A 12/30/2017   Procedure: COLONOSCOPY WITH PROPOFOL;  Surgeon: Ladene Artist, MD;  Location: [facility];  Service: Endoscopy;  Laterality: N/A;   JOINT REPLACEMENT     Right hip replacement Dr. Alvan Dame 01-05-18   LYMPHADENECTOMY Bilateral 11/06/2014   Procedure: PELVIC LYMPHADENECTOMY;  Surgeon: [name], MD;  Location: [facility];  Service: Urology;  Laterality: Bilateral;   PACEMAKER INSERTION  2000; 2012   MDT dual chamber pacemaker implanted 2000 with gen change 2012   POLYPECTOMY  12/30/2017   Procedure: POLYPECTOMY;  Surgeon: Ladene Artist, MD;  Location: [facility];  Service: Endoscopy;;   PROSTATE BIOPSY  09/05/14   REVERSE SHOULDER ARTHROPLASTY Left 10/15/2021   Procedure: REVERSE SHOULDER ARTHROPLASTY;  Surgeon: Justice Britain, MD;  Location: [facility];  Service: Orthopedics;  Laterality: Left;   ROBOT ASSISTED LAPAROSCOPIC RADICAL PROSTATECTOMY N/A 11/06/2014   Procedure: ROBOTIC ASSISTED LAPAROSCOPIC RADICAL PROSTATECTOMY LEVEL 2;  Surgeon: [name], MD;  Location: [facility];  Service: Urology;  Laterality: N/A;   TOTAL HIP ARTHROPLASTY Left 07/03/2014   Procedure: LEFT TOTAL HIP ARTHROPLASTY ANTERIOR APPROACH;  Surgeon: [name], MD;  Location: [facility];   Service: Orthopedics;  Laterality: Left;   TOTAL HIP ARTHROPLASTY Right 01/05/2018   Procedure: RIGHT TOTAL HIP ARTHROPLASTY ANTERIOR APPROACH;  Surgeon: Paralee Cancel, MD;  Location: [facility];  Service: Orthopedics;  Laterality: Right;  70 mins   WISDOM TEETH EXTRACTIONS     Family History  Problem Relation Age of Onset   Stroke Mother        multiple. 63.    Heart disease Mother        hx heart valve problem   Prostate cancer Father        59 of prostate cancer   Heart attack Neg Hx    Hypertension Neg Hx    Social History:  reports that he has never smoked. He has never used smokeless tobacco. He reports current alcohol use of about 2.0 - 3.0 standard drinks of alcohol per week. He reports that he does not use drugs. Allergies: No Known Allergies Medications Prior to Admission  Medication Sig Dispense Refill   acetaminophen (TYLENOL) 325 MG tablet Take 650 mg by mouth every 6 (six) hours as needed for mild pain or headache.     amoxicillin (AMOXIL) 500 MG capsule Take 4 capsules by mouth 1 hour prior to dental appointment for pre-medication. (Patient taking differently: Take 2,000 mg by mouth See admin instructions. Take 2,000 mg by mouth one hour prior to dental appointments for pre-medication) 12 capsule 0   atorvastatin (LIPITOR) 20 MG tablet Take 1 tablet by mouth once daily (Patient taking differently: Take 20  mg by mouth daily.) 90 tablet 3   calcium carbonate (OS-CAL - DOSED IN MG OF ELEMENTAL CALCIUM) 1250 (500 Ca) MG tablet Take 1 tablet by mouth 2 (two) times a week.     denosumab (XGEVA) 120 MG/1.7ML SOLN injection Inject 120 mg into the skin every 2 (two) months.     docusate sodium (COLACE) 100 MG capsule Take 100 mg by mouth daily as needed for mild constipation.     ferrous sulfate 325 (65 FE) MG tablet Take 325 mg by mouth daily with breakfast.     HYDROcodone-acetaminophen (NORCO/VICODIN) 5-325 MG tablet Take 1 tablet by mouth at bedtime as needed. 6 tablet 0    leuprolide, 6 Month, (ELIGARD) 45 MG injection Inject 45 mg into the skin every 6 (six) months.     predniSONE (DELTASONE) 10 MG tablet Take 1 tablet (10 mg total) by mouth every morning. 30 tablet 11   prochlorperazine (COMPAZINE) 10 MG tablet Take 1 tablet (10 mg total) by mouth every 6 (six) hours as needed as needed for nausea or vomiting. 30 tablet 8   traMADol (ULTRAM) 50 MG tablet Take 1 tablet (50 mg total) by mouth every 8 (eight) hours as needed for Pain for up to 20 days 60 tablet 0   valsartan (DIOVAN) 160 MG tablet Take 1 tablet (160 mg total) by mouth daily. 90 tablet 3      Home: Home Living Family/patient expects to be discharged to:: Private residence Living Arrangements: Spouse/significant other Available Help at Discharge: Family, Available PRN/intermittently Type of Home: House Home Access: Stairs to enter [employer] of Steps: 2 (front); several with rail (at back)--normal entry for them Entrance Stairs-Rails: None Home Layout: Two level, Laundry or work area in basement, Able to live on main level with bedroom/bathroom Bathroom Shower/Tub: Other (comment) (roll in shower) Bathroom Toilet: Handicapped height Home Equipment: Conservation officer, nature (2 wheels) Additional Comments: retired Engineer, materials  Lives With: Spouse   Functional History: Prior Function Prior Level of Function : Independent/Modified Independent  Functional Status:  Mobility: Bed Mobility Overal bed mobility: Needs Assistance Bed Mobility: Supine to Sit Supine to sit: Mod assist General bed mobility comments: seated eob ON ARRIVAL Transfers Overall transfer level: Needs assistance Equipment used: Rolling walker (2 wheels) Transfers: Sit to/from Stand Sit to Stand: Mod assist, Min assist Bed to/from chair/wheelchair/BSC transfer type:: Step pivot Step pivot transfers: Min assist General transfer comment: cues for sequencing, increased time, assist to power up  EOB>BSC>recliner Ambulation/Gait Ambulation/Gait assistance: Min guard, Mod assist Gait Distance (Feet): 100 Feet Assistive device: Rolling walker (2 wheels) Gait Pattern/deviations: Step-through pattern, Trunk flexed, Shuffle General Gait Details: shuffling gait with standing rest every ~5 steps, gorssly min guard, mod asssit to correct x1 LOB during turning, cues to step into RW with pt able to maintain throghout,    ADL: ADL Overall ADL's : Needs assistance/impaired Eating/Feeding: Supervision/ safety, Set up, Sitting Grooming: Set up, Supervision/safety, Sitting Upper Body Bathing: Set up, Supervision/ safety, Sitting Lower Body Bathing: Moderate assistance Lower Body Bathing Details (indicate cue type and reason): Mod A sit<>stand Upper Body Dressing : Moderate assistance, Sitting Lower Body Dressing: Maximal assistance Lower Body Dressing Details (indicate cue type and reason): Mod A sit<>stand Toilet Transfer: Minimal assistance, Stand-pivot, BSC/3in1 Toileting- Clothing Manipulation and Hygiene: Total assistance Toileting - Clothing Manipulation Details (indicate cue type and reason): Mod A sit<>stand  Cognition: Cognition Overall Cognitive Status: Impaired/Different from baseline Orientation Level: Oriented X4 Cognition Arousal/Alertness: Awake/alert Behavior During Therapy: Flat  affect Overall Cognitive Status: Impaired/Different from baseline Area of Impairment: Following commands, Problem solving, Awareness Following Commands: Follows one step commands with increased time Awareness: Emergent Problem Solving: Slow processing, Difficulty sequencing  Physical Exam: Blood pressure (!) 157/88, pulse 91, temperature 99 F (37.2 C), temperature source Oral, resp. rate 14, height 6' (1.829 m), weight 103 kg, SpO2 98 %. Physical Exam  Results for orders placed or performed during the hospital encounter of 05/15/2022 (from the past 48 hour(s))  Type and screen [place]     Status: None (Preliminary result)   Collection Time: 06/15/22  4:02 PM  Result Value Ref Range   ABO/RH(D) A POS    Antibody Screen NEG    Sample Expiration 06/18/2022,2359    Unit Number [id number]    Blood Component Type RED CELLS,LR    Unit division 00    Status of Unit ISSUED    Transfusion Status OK TO TRANSFUSE    Crossmatch Result      Compatible Performed at [facility], [address], [place], [place] [address]   Gastrointestinal Panel by PCR , Stool     Status: None   Collection Time: 06/16/22  4:01 AM   Specimen: Stool  Result Value Ref Range   Campylobacter species NOT DETECTED NOT DETECTED   Plesimonas shigelloides NOT DETECTED NOT DETECTED   Salmonella species NOT DETECTED NOT DETECTED   Yersinia enterocolitica NOT DETECTED NOT DETECTED   Vibrio species NOT DETECTED NOT DETECTED   Vibrio cholerae NOT DETECTED NOT DETECTED   Enteroaggregative E coli (EAEC) NOT DETECTED NOT DETECTED   Enteropathogenic E coli (EPEC) NOT DETECTED NOT DETECTED   Enterotoxigenic E coli (ETEC) NOT DETECTED NOT DETECTED   Shiga like toxin producing E coli (STEC) NOT DETECTED NOT DETECTED   Shigella/Enteroinvasive E coli (EIEC) NOT DETECTED NOT DETECTED   Cryptosporidium NOT DETECTED NOT DETECTED   Cyclospora cayetanensis NOT DETECTED NOT DETECTED   Entamoeba histolytica NOT DETECTED NOT DETECTED   Giardia lamblia NOT DETECTED NOT DETECTED   Adenovirus F40/41 NOT DETECTED NOT DETECTED   Astrovirus NOT DETECTED NOT DETECTED   Norovirus GI/GII NOT DETECTED NOT DETECTED   Rotavirus A NOT DETECTED NOT DETECTED   Sapovirus (I, II, IV, and V) NOT DETECTED NOT DETECTED    Comment: Performed at [facility], [place] [place], [place] [address]  CBC     Status: Abnormal   Collection Time: 06/16/22  7:15 AM  Result Value Ref Range   WBC 3.7 (L) 4.0 - 10.5 K/uL   RBC 2.68 (L) 4.22 - 5.81 MIL/uL   Hemoglobin 7.6 (L) 13.0 - 17.0 g/dL   HCT 21.5  (L) 39.0 - 52.0 %   MCV 80.2 80.0 - 100.0 fL   MCH 28.4 26.0 - 34.0 pg   MCHC 35.3 30.0 - 36.0 g/dL   RDW 17.2 (H) 11.5 - 15.5 %   Platelets 25 (LL) 150 - 400 K/uL    Comment: Immature Platelet Fraction may be clinically indicated, consider ordering this additional test [id number] REPEATED TO VERIFY THIS CRITICAL RESULT HAS VERIFIED AND BEEN CALLED TO BAYNES,C RN BY AMANDA LEONARD ON 12 04 2023 AT 0834, AND HAS BEEN READ BACK.     nRBC 0.0 0.0 - 0.2 %    Comment: Performed at [facility], [place] [address], [place], [place] [address]  Basic metabolic panel     Status: Abnormal   Collection Time: 06/16/22  7:15 AM  Result Value Ref Range   Sodium 129 (L) 135 - 145 mmol/L   Potassium 3.2 (L) 3.5 - 5.1 mmol/L   Chloride 100 98 - 111 mmol/L   CO2 17 (L) 22 - 32 mmol/L   Glucose, Bld 114 (H) 70 - 99 mg/dL    Comment: Glucose reference range applies only to samples taken after fasting for at least 8 hours.   BUN 32 (H) 8 - 23 mg/dL   Creatinine, Ser 1.18 0.61 - 1.24 mg/dL   Calcium 6.6 (L) 8.9 - 10.3 mg/dL   GFR, Estimated >60 >60 mL/min    Comment: (NOTE) Calculated using the CKD-EPI Creatinine Equation (2021)    Anion gap 12 5 - 15    Comment: Performed at [facility] [address] [place], [place] [address]  Procalcitonin     Status: None   Collection Time: 06/16/22  7:15 AM  Result Value Ref Range   Procalcitonin 7.37 ng/mL    Comment:        Interpretation: PCT > 2 ng/mL: Systemic infection (sepsis) is likely, unless other causes are known. (NOTE)       Sepsis PCT Algorithm           Lower Respiratory Tract                                      Infection PCT Algorithm    ----------------------------     ----------------------------         PCT < 0.25 ng/mL                PCT < 0.10 ng/mL          Strongly encourage             Strongly discourage   discontinuation of antibiotics    initiation of antibiotics    ----------------------------      -----------------------------       PCT 0.25 - 0.50 ng/mL            PCT 0.10 - 0.25 ng/mL               OR       >80% decrease in PCT            Discourage initiation of                                            antibiotics      Encourage discontinuation           of antibiotics    ----------------------------     -----------------------------         PCT >= 0.50 ng/mL              PCT 0.26 - 0.50 ng/mL               AND       <80% decrease in PCT              Encourage initiation of                                             antibiotics  Encourage continuation           of antibiotics    ----------------------------     -----------------------------        PCT >= 0.50 ng/mL                  PCT > 0.50 ng/mL               AND         increase in PCT                  Strongly encourage                                      initiation of antibiotics    Strongly encourage escalation           of antibiotics                                     -----------------------------                                           PCT <= 0.25 ng/mL                                                 OR                                        > 80% decrease in PCT                                      Discontinue / Do not initiate                                             antibiotics  Performed at [facility], [address] [place], [place] [address]   TSH     Status: Abnormal   Collection Time: 06/16/22  7:48 AM  Result Value Ref Range   TSH 19.164 (H) 0.350 - 4.500 uIU/mL    Comment: Performed by a 3rd Generation assay with a functional sensitivity of <=0.01 uIU/mL. Performed at [facility], [place] [address] [place], [place] [address]   T4, free     Status: None   Collection Time: 06/16/22  7:48 AM  Result Value Ref Range   Free T4 0.69 0.61 - 1.12 ng/dL    Comment: (NOTE) Biotin ingestion may interfere with free T4 tests. If the results are inconsistent with the TSH level,  previous test results, or the clinical presentation, then consider biotin interference. If needed, order repeat testing after stopping biotin. Performed at [facility], [place] [address], [place], [place] [address]   Prepare platelet pheresis     Status: None   Collection Time: 06/16/22  4:06 PM  Result Value Ref Range   Unit Number [id number]    Blood Component Type PSORALEN TREATED    Unit division 00    Status of Unit ISSUED,FINAL    Transfusion Status      OK TO TRANSFUSE Performed at [facility], [address] [place], [place] [address]    Unit Number [id number]    Blood Component Type PLTP2 PSORALEN TREATED    Unit division 00    Status of Unit ISSUED,FINAL    Transfusion Status OK TO TRANSFUSE   CBC     Status: Abnormal   Collection Time: 06/16/22 11:09 PM  Result Value Ref Range   WBC 3.8 (L) 4.0 - 10.5 K/uL   RBC 2.48 (L) 4.22 - 5.81 MIL/uL   Hemoglobin 6.9 (LL) 13.0 - 17.0 g/dL    Comment: REPEATED TO VERIFY THIS CRITICAL RESULT HAS VERIFIED AND BEEN CALLED TO R. MISTICA, RN BY HAYLEE HOWARD ON 12 04 2023 AT 2336, AND HAS BEEN READ BACK.     HCT 20.4 (L) 39.0 - 52.0 %   MCV 82.3 80.0 - 100.0 fL   MCH 27.8 26.0 - 34.0 pg   MCHC 33.8 30.0 - 36.0 g/dL   RDW 17.2 (H) 11.5 - 15.5 %   Platelets 53 (L) 150 - 400 K/uL    Comment: Immature Platelet Fraction may be clinically indicated, consider ordering this additional test [id number] REPEATED TO VERIFY    nRBC 0.0 0.0 - 0.2 %    Comment: Performed at [facility], [place] [address], [place], [place] [address]  Prepare RBC (crossmatch)     Status: None   Collection Time: 06/16/22 11:58 PM  Result Value Ref Range   Order Confirmation      ORDER PROCESSED BY BLOOD BANK Performed at [facility], [place] [address], [place], [place] [address]   CBC     Status: Abnormal   Collection Time: 06/17/22  6:54 AM  Result Value Ref Range   WBC 3.5 (L) 4.0 - 10.5 K/uL   RBC 2.86 (L) 4.22 - 5.81 MIL/uL    Hemoglobin 8.3 (L) 13.0 - 17.0 g/dL   HCT 23.1 (L) 39.0 - 52.0 %   MCV 80.8 80.0 - 100.0 fL   MCH 29.0 26.0 - 34.0 pg   MCHC 35.9 30.0 - 36.0 g/dL   RDW 17.0 (H) 11.5 - 15.5 %   Platelets 44 (L) 150 - 400 K/uL    Comment: Immature Platelet Fraction may be clinically indicated, consider ordering this additional test [id number] REPEATED TO VERIFY    nRBC 0.0 0.0 - 0.2 %    Comment: Performed at [facility], [place] [address] [place], [place] [address]  Basic metabolic panel     Status: Abnormal   Collection Time: 06/17/22  6:54 AM  Result Value Ref Range   Sodium 134 (L) 135 - 145 mmol/L   Potassium 4.1 3.5 - 5.1 mmol/L   Chloride 107 98 - 111 mmol/L   CO2 18 (L) 22 - 32 mmol/L   Glucose, Bld 102 (H) 70 - 99 mg/dL    Comment: Glucose reference range applies only to samples taken after fasting for at least 8 hours.   BUN 32 (H) 8 - 23 mg/dL   Creatinine, Ser 1.11 0.61 - 1.24 mg/dL   Calcium 7.1 (L) 8.9 - 10.3 mg/dL   GFR, Estimated >60 >60 mL/min    Comment: (NOTE) Calculated using the CKD-EPI  Creatinine Equation (2021)    Anion gap 9 5 - 15    Comment: Performed at [facility], [place] [address] [place], [place] [address]  Procalcitonin     Status: None   Collection Time: 06/17/22  6:54 AM  Result Value Ref Range   Procalcitonin 4.00 ng/mL    Comment:        Interpretation: PCT > 2 ng/mL: Systemic infection (sepsis) is likely, unless other causes are known. (NOTE)       Sepsis PCT Algorithm           Lower Respiratory Tract                                      Infection PCT Algorithm    ----------------------------     ----------------------------         PCT < 0.25 ng/mL                PCT < 0.10 ng/mL          Strongly encourage             Strongly discourage   discontinuation of antibiotics    initiation of antibiotics    ----------------------------     -----------------------------       PCT 0.25 - 0.50 ng/mL            PCT 0.10 - 0.25 ng/mL                OR       >80% decrease in PCT            Discourage initiation of                                            antibiotics      Encourage discontinuation           of antibiotics    ----------------------------     -----------------------------         PCT >= 0.50 ng/mL              PCT 0.26 - 0.50 ng/mL               AND       <80% decrease in PCT              Encourage initiation of                                             antibiotics       Encourage continuation           of antibiotics    ----------------------------     -----------------------------        PCT >= 0.50 ng/mL                  PCT > 0.50 ng/mL               AND         increase in PCT                  Strongly encourage  initiation of antibiotics    Strongly encourage escalation           of antibiotics                                     -----------------------------                                           PCT <= 0.25 ng/mL                                                 OR                                        > 80% decrease in PCT                                      Discontinue / Do not initiate                                             antibiotics  Performed at [facility], [address], [place], [place] [address]    CT HEAD WO CONTRAST (5MM)  Result Date: 06/15/2022 CLINICAL DATA:  Stroke follow-up EXAM: CT HEAD WITHOUT CONTRAST TECHNIQUE: Contiguous axial images were obtained from the base of the skull through the vertex without intravenous contrast. RADIATION DOSE REDUCTION: This exam was performed according to the departmental dose-optimization program which includes automated exposure control, adjustment of the mA and/or kV according to patient size and/or use of iterative reconstruction technique. COMPARISON:  06/14/2022 FINDINGS: Brain: There are areas of loss of gray-white differentiation in the left occipital lobe and right parietal lobe,  unchanged. No acute hemorrhage. Mild generalized volume loss. There is periventricular hypoattenuation compatible with chronic microvascular disease. Incidentally noted cavum septum pellucidum et vergae. Vascular: No hyperdense vessel or unexpected calcification. Skull: Normal. Negative for fracture or focal lesion. Sinuses/Orbits: No acute finding. Other: None. IMPRESSION: Unchanged areas of loss of gray-white differentiation in the left occipital lobe and right parietal lobe, consistent with acute/subacute infarcts. No acute hemorrhage. Electronically Signed   By: Ulyses Jarred M.D.   On: 06/15/2022 21:54   ECHOCARDIOGRAM COMPLETE  Result Date: 06/15/2022    ECHOCARDIOGRAM REPORT   Patient Name:   Devin Mercado [facility] Date of Exam: 06/15/2022 Medical Rec #:  [id number]        Height:       72.0 in Accession #:    [id number]       Weight:       227.1 lb Date of Birth:  12-17-1950         BSA:          2.249 m Patient Age:    30 years         BP:           120/59 mmHg Patient  Gender: M                HR:           88 bpm. Exam Location:  Inpatient Procedure: 2D Echo, Color Doppler and Cardiac Doppler Indications:    Stroke i63.9  History:        Patient has no prior history of Echocardiogram examinations.                 Pacemaker; Risk Factors:Hypertension and Dyslipidemia.  Sonographer:    Raquel Sarna Senior RDCS Referring Phys: [phone] [facility]  Sonographer Comments: Technically difficult due to poor echo windows. IMPRESSIONS  1. Difficult acoustic windows limit study Would recomm limited echo with Definity to confirm LVEF and wall motion. There appears to be hypokinesis of the mid/distal lateral, mid/distal septal, distal inferior walls and apical akinesis.. Left ventricular  ejection fraction, by estimation, is 30%. The left ventricle has moderately decreased function.  2. Right ventricular systolic function is normal. The right ventricular size is normal. There is normal pulmonary artery systolic pressure.  3. The  mitral valve is normal in structure. Trivial mitral valve regurgitation.  4. The aortic valve is normal in structure. Aortic valve regurgitation is not visualized.  5. There is mild dilatation of the ascending aorta, measuring 42 mm.  6. The inferior vena cava is normal in size with greater than 50% respiratory variability, suggesting right atrial pressure of 3 mmHg. FINDINGS  Left Ventricle: Difficult acoustic windows limit study Would recomm limited echo with Definity to confirm LVEF and wall motion. There appears to be hypokinesis of the mid/distal lateral, mid/distal septal, distal inferior walls and apical akinesis. Left  ventricular ejection fraction, by estimation, is 30%. The left ventricle has moderately decreased function. The left ventricular internal cavity size was normal in size. There is no left ventricular hypertrophy. Right Ventricle: The right ventricular size is normal. Right vetricular wall thickness was not assessed. Right ventricular systolic function is normal. There is normal pulmonary artery systolic pressure. The tricuspid regurgitant velocity is 2.31 m/s, and with an assumed right atrial pressure of 3 mmHg, the estimated right ventricular systolic pressure is 14.4 mmHg. Left Atrium: Left atrial size was normal in size. Right Atrium: Right atrial size was normal in size. Pericardium: Trivial pericardial effusion is present. Mitral Valve: The mitral valve is normal in structure. Trivial mitral valve regurgitation. Tricuspid Valve: The tricuspid valve is grossly normal. Tricuspid valve regurgitation is mild. Aortic Valve: The aortic valve is normal in structure. Aortic valve regurgitation is not visualized. Pulmonic Valve: The pulmonic valve was not well visualized. Pulmonic valve regurgitation is not visualized. No evidence of pulmonic stenosis. Aorta: There is mild dilatation of the ascending aorta, measuring 42 mm. Venous: The inferior vena cava is normal in size with greater than 50%  respiratory variability, suggesting right atrial pressure of 3 mmHg. IAS/Shunts: No atrial level shunt detected by color flow Doppler. Additional Comments: A device lead is visualized.  LEFT VENTRICLE PLAX 2D LVIDd:         5.50 cm LVIDs:         3.30 cm LV PW:         1.10 cm LV IVS:        1.20 cm LVOT diam:     2.40 cm LV SV:         85 LV SV Index:   38 LVOT Area:     4.52 cm  LV Volumes (MOD) LV vol d, MOD  A2C: 112.0 ml LV vol d, MOD A4C: 139.0 ml LV vol s, MOD A2C: 65.0 ml LV vol s, MOD A4C: 72.7 ml LV SV MOD A2C:     47.0 ml LV SV MOD A4C:     139.0 ml LV SV MOD BP:      56.6 ml RIGHT VENTRICLE RV S prime:     17.70 cm/s TAPSE (M-mode): 2.2 cm LEFT ATRIUM             Index        RIGHT ATRIUM           Index LA diam:        3.60 cm 1.60 cm/m   RA Area:     21.10 cm LA Vol (A2C):   66.4 ml 29.53 ml/m  RA Volume:   60.60 ml  26.95 ml/m LA Vol (A4C):   63.5 ml 28.24 ml/m LA Biplane Vol: 65.8 ml 29.26 ml/m  AORTIC VALVE LVOT Vmax:   102.00 cm/s LVOT Vmean:  71.100 cm/s LVOT VTI:    0.188 m  AORTA Ao Root diam: 4.00 cm Ao Asc diam:  4.20 cm TRICUSPID VALVE TR Peak grad:   21.3 mmHg TR Vmax:        231.00 cm/s  SHUNTS Systemic VTI:  0.19 m Systemic Diam: 2.40 cm Dorris Carnes MD Electronically signed by Dorris Carnes MD Signature Date/Time: 06/15/2022/4:20:16 PM    Final       Blood pressure (!) 157/88, pulse 91, temperature 99 F (37.2 C), temperature source Oral, resp. rate 14, height 6' (1.829 m), weight 103 kg, SpO2 98 %.  Medical Problem List and Plan: 1. Functional deficits secondary to ***  -patient may *** shower  -ELOS/Goals: *** 2.  Antithrombotics: -DVT/anticoagulation:  {VTE PROPHYLAXIS/ANTICOAGULATION - [id number]}  -antiplatelet therapy: *** 3. Pain Management: *** 4. Mood/Behavior/Sleep: ***  -antipsychotic agents: *** 5. Neuropsych/cognition: This patient *** capable of making decisions on *** own behalf. 6. Skin/Wound Care: *** 7. Fluids/Electrolytes/Nutrition: *** Prostate  cancer with bone mets: followed at [facility]  -bone marrow suppression from [facility]icto Radiation cystitis with gross hematuria Urinary retention Febrile illness Embolic left PICA CVA ABLA s/p 3 units PRBCs Pancytopenia/Thrombocytopenia: s/p 4 units platelet pheresis A-V block s/p pacemaker Hyponatremia Elevated BUN Hypertension Hyperlipidemia  ***  Barbie Banner, PA-C 06/17/2022

## 2022-06-17 NOTE — Plan of Care (Signed)
  Problem: Education: Goal: Knowledge of General Education information will improve Description: Including pain rating scale, medication(s)/side effects and non-pharmacologic comfort measures Outcome: Progressing   Problem: Health Behavior/Discharge Planning: Goal: Ability to manage health-related needs will improve Outcome: Progressing   Problem: Clinical Measurements: Goal: Ability to maintain clinical measurements within normal limits will improve Outcome: Progressing Goal: Will remain free from infection Outcome: Progressing Goal: Diagnostic test results will improve Outcome: Progressing Goal: Respiratory complications will improve Outcome: Progressing Goal: Cardiovascular complication will be avoided Outcome: Progressing   Problem: Activity: Goal: Risk for activity intolerance will decrease Outcome: Progressing   Problem: Nutrition: Goal: Adequate nutrition will be maintained Outcome: Progressing   Problem: Coping: Goal: Level of anxiety will decrease Outcome: Progressing   Problem: Elimination: Goal: Will not experience complications related to bowel motility Outcome: Progressing Goal: Will not experience complications related to urinary retention Outcome: Progressing   Problem: Pain Managment: Goal: General experience of comfort will improve Outcome: Progressing   Problem: Safety: Goal: Ability to remain free from injury will improve Outcome: Progressing   Problem: Skin Integrity: Goal: Risk for impaired skin integrity will decrease Outcome: Progressing   Problem: Education: Goal: Knowledge of disease or condition will improve Outcome: Progressing Goal: Knowledge of secondary prevention will improve (MUST DOCUMENT ALL) Outcome: Progressing

## 2022-06-17 NOTE — Progress Notes (Signed)
DR Howerter advised to with hold the in and out straight cath to pt as a management to urine retention instead he said already communicated to AM duty doctor for the a possible insertion of FC

## 2022-06-17 NOTE — Progress Notes (Addendum)
Mobility Specialist Progress Note   06/17/22 1000  Pain Assessment  Pain Assessment 0-10  Pain Score 10  Pain Location R-Shoulder (Scapularis/subscapularis)  Pain Descriptors / Indicators Discomfort;Grimacing;Guarding  Pain Intervention(s) Limited activity within patient's tolerance;Heat applied  Mobility  Activity Ambulated with assistance in hallway (in recliner before and after ambulation)  Level of Assistance Contact guard assist, steadying assist  Assistive Device Front wheel walker  Distance Ambulated (ft) 120 ft  Range of Motion/Exercises Active;All extremities  Activity Response Tolerated well   Patient received in recliner, eager to participate. Progressing well with mobility but limited by weakness, pain, fatigue and safety awareness. Required increased time and mod A to stand from recliner. Ambulated min guard but required x5 standing rest breaks secondary to fatigue. Returned to room without incident. Patient with significant pain and discomfort in right shoulder which limited all functional tasks. OT and PT notified. Was left in recliner with all needs met, call bell in reach.   [place] Kirt Chew, BS EXP Mobility Specialist Please contact via SecureChat or Rehab office at [phone]

## 2022-06-17 NOTE — Progress Notes (Signed)
[facility] for Infectious Disease   Reason for visit: Follow up on fever  Interval History: he remains afebrile now, WBC 3.5.  more formed stool output.    GI pathogen panel negative  Physical Exam: Constitutional:  Vitals:   06/17/22 0736 06/17/22 1209  BP: 136/79 (!) 157/88  Pulse: 81 91  Resp: 20 14  Temp: 98.7 F (37.1 C) 99 F (37.2 C)  SpO2: 99% 98%   patient appears in NAD Respiratory: Normal respiratory effort MS: bilateral knees without effusion, no warmth  Review of Systems: Constitutional: negative for fevers and chills  Lab Results  Component Value Date   WBC 3.5 (L) 06/17/2022   HGB 8.3 (L) 06/17/2022   HCT 23.1 (L) 06/17/2022   MCV 80.8 06/17/2022   PLT 44 (L) 06/17/2022    Lab Results  Component Value Date   CREATININE 1.11 06/17/2022   BUN 32 (H) 06/17/2022   NA 134 (L) 06/17/2022   K 4.1 06/17/2022   CL 107 06/17/2022   CO2 18 (L) 06/17/2022    Lab Results  Component Value Date   ALT 11 06/11/2022   AST 18 06/08/2022   ALKPHOS 70 06/07/2022     Microbiology: Recent Results (from the past 240 hour(s))  Culture, blood (Routine X 2) w Reflex to ID Panel     Status: None (Preliminary result)   Collection Time: 06/13/22  7:01 PM   Specimen: BLOOD  Result Value Ref Range Status   Specimen Description   Final    BLOOD LEFT ANTECUBITAL Performed at [facility], [place] [address], [place], [place] [address]    Special Requests   Final    BOTTLES DRAWN AEROBIC ONLY Blood Culture adequate volume Performed at [facility] [address] [place], [place] [address]    Culture   Final    NO GROWTH 4 DAYS Performed at [facility], [place] [address] [place], [place] [address]    Report Status PENDING  Incomplete  Culture, blood (Routine X 2) w Reflex to ID Panel     Status: None (Preliminary result)   Collection Time: 06/13/22  7:04 PM   Specimen: BLOOD  Result Value Ref Range Status    Specimen Description   Final    BLOOD BLOOD RIGHT HAND Performed at [facility] [address] [place], [place] [address]    Special Requests   Final    BOTTLES DRAWN AEROBIC ONLY Blood Culture results may not be optimal due to an inadequate volume of blood received in culture bottles Performed at [facility] [address], [place], [place] [address]    Culture   Final    NO GROWTH 4 DAYS Performed at [facility], [place] [address] [place],  [address]    Report Status PENDING  Incomplete  Gastrointestinal Panel by PCR , Stool     Status: None   Collection Time: 06/16/22  4:01 AM   Specimen: Stool  Result Value Ref Range Status   Campylobacter species NOT DETECTED NOT DETECTED Final   Plesimonas shigelloides NOT DETECTED NOT DETECTED Final   Salmonella species NOT DETECTED NOT DETECTED Final   Yersinia enterocolitica NOT DETECTED NOT DETECTED Final   Vibrio species NOT DETECTED NOT DETECTED Final   Vibrio cholerae NOT DETECTED NOT DETECTED Final   Enteroaggregative E coli (EAEC) NOT DETECTED NOT DETECTED Final   Enteropathogenic E coli (EPEC) NOT DETECTED NOT DETECTED Final  Enterotoxigenic E coli (ETEC) NOT DETECTED NOT DETECTED Final   Shiga like toxin producing E coli (STEC) NOT DETECTED NOT DETECTED Final   Shigella/Enteroinvasive E coli (EIEC) NOT DETECTED NOT DETECTED Final   Cryptosporidium NOT DETECTED NOT DETECTED Final   Cyclospora cayetanensis NOT DETECTED NOT DETECTED Final   Entamoeba histolytica NOT DETECTED NOT DETECTED Final   Giardia lamblia NOT DETECTED NOT DETECTED Final   Adenovirus F40/41 NOT DETECTED NOT DETECTED Final   Astrovirus NOT DETECTED NOT DETECTED Final   Norovirus GI/GII NOT DETECTED NOT DETECTED Final   Rotavirus A NOT DETECTED NOT DETECTED Final   Sapovirus (I, II, IV, and V) NOT DETECTED NOT DETECTED Final    Comment: Performed at [facility], [address], [place],  [place] [address]    Impression/Plan:  1. Fever - intermittent and no particular infectious symptoms or sites of infection noted.  GI panel negative.  Diarrhea is improving without treatment.  No obvious etiology. Will await TEE to see if there are any concerns.  If none, will continue with supportive care.    2.  Pancytopenia - this is not new and stable.  Will continue to monitor.   3.  Hemorrhagic cystisis - + hematuria again overnight. Followed by urology.

## 2022-06-17 NOTE — Progress Notes (Signed)
Inpatient Rehab Admissions Coordinator:    I received insurance auth but Pt. Not quite medically ready. I will follow for potential admit pending medical readiness.  Clemens Catholic, [place], [place] Admissions Coordinator  [phone] ([place]) [phone] (office)

## 2022-06-17 NOTE — Progress Notes (Signed)
STROKE TEAM PROGRESS NOTE   INTERVAL HISTORY His wife  Dr Devin Mercado is at the bedside. He states neurological deficits are improving.  He still has hematuria the platelet count is stable at 44,000.  Still has some right sided incoordination but otherwise is improving.  Vital signs stable. Vitals:   06/17/22 0330 06/17/22 0736 06/17/22 1209 06/17/22 1543  BP: 123/65 136/79 (!) 157/88 (!) 141/78  Pulse: 71 81 91 85  Resp: '18 20 14 16  '$ Temp: 98.5 F (36.9 C) 98.7 F (37.1 C) 99 F (37.2 C) 99.1 F (37.3 C)  TempSrc: Axillary Oral Oral Oral  SpO2: 97% 99% 98% 98%  Weight:      Height:       CBC:  Recent Labs  Lab 06/13/22 1901 06/14/22 0709 06/14/22 0844 06/14/22 2302 06/16/22 2309 06/17/22 0654  WBC 3.6*  --  2.4*   < > 3.8* 3.5*  NEUTROABS 1.9  --  1.4*  --   --   --   HGB 8.1*   < > 7.3*   < > 6.9* 8.3*  HCT 24.6*   < > 22.5*   < > 20.4* 23.1*  MCV 85.4  --  86.2   < > 82.3 80.8  PLT 80*  --  43*   < > 53* 44*   < > = values in this interval not displayed.   Basic Metabolic Panel:  Recent Labs  Lab 06/13/22 1901 06/14/22 0254 06/16/22 0715 06/17/22 0654  NA 125*   < > 129* 134*  K 4.3   < > 3.2* 4.1  CL 98   < > 100 107  CO2 18*   < > 17* 18*  GLUCOSE 142*   < > 114* 102*  BUN 30*   < > 32* 32*  CREATININE 1.17   < > 1.18 1.11  CALCIUM 6.9*   < > 6.6* 7.1*  MG 2.1  --   --   --    < > = values in this interval not displayed.   Lipid Panel: No results for input(s): "CHOL", "TRIG", "HDL", "CHOLHDL", "VLDL", "LDLCALC" in the last 168 hours. HgbA1c: No results for input(s): "HGBA1C" in the last 168 hours. Urine Drug Screen: No results for input(s): "LABOPIA", "COCAINSCRNUR", "LABBENZ", "AMPHETMU", "THCU", "LABBARB" in the last 168 hours.  Alcohol Level No results for input(s): "ETH" in the last 168 hours.  IMAGING past 24 hours No results found.  PHYSICAL EXAM  Temp:  [98.3 F (36.8 C)-99.1 F (37.3 C)] 99.1 F (37.3 C) (12/05 1543) Pulse Rate:  [67-92] 85  (12/05 1543) Resp:  [14-20] 16 (12/05 1543) BP: (117-157)/(65-88) 141/78 (12/05 1543) SpO2:  [97 %-100 %] 98 % (12/05 1543)  General - Well nourished, well developed pleasant middle-aged Caucasian male, in no apparent distress.Sitting up in chair.   Mental Status -  Level of arousal and orientation to time, place, and person were intact. Language including expression, naming, repetition, comprehension was assessed, slight dysarthria.  Fund of Knowledge was assessed and was intact.  Cranial Nerves II - XII - II - left upper visual cut. III, IV, VI - Extraocular movements intact. V - Facial sensation intact bilaterally. VII - Facial movement intact bilaterally. VIII - Hearing & vestibular intact bilaterally. X - Palate elevates symmetrically. XI - Chin turning & shoulder shrug intact unsymmetrical. Patient had recent shoulder surgery and said this is his baseline. XII - Tongue protrusion intact.  Motor Strength - The patient's strength was normal in  all extremities. RUE exhibited slight pronotor drift, decreased grip.  He has left shoulder replacement in the past and this may be limiting range of motion. Motor Tone - Muscle tone was assessed at the neck and appendages and was normal.  Sensory - Light touch, temperature/pinprick were assessed and were symmetrical.    Coordination - Ataxia/Decreased coordination present in RUE. Patient able to hold cup bit had problems setting cup back down on table and was unable to properly do FNT on RUE.   Gait and Station - deferred.  ASSESSMENT/PLAN [name] is a 71 y.o. male with history of hypertension, hyperlipidemia,complete heart block, s/p non-MRI-compatible pacemaker, metastatic prostate cancer with bone mets (treated at [facility]), presenting with acute right arm ataxia while being treated at [facility] for gross hematuria. CT showed L occipital and R parietal infarct. Transferred to [facility] for stroke work-up. Repeat CT ordered today.    Stroke Etiology: Likely cardioembolic from new onset A-fib CT head: Focal hypoattenuation in the posterior right parietal lobe measuring up to 2.5 cm. This is concerning for acute or subacute infarct. Focal cortical hypoattenuation in the posterior left occipital lobe is also concerning for acute or subacute infarct. No other acute or focal cortical abnormalities. Aspects is 9/10. Stable atrophy and white matter disease. CTA head & neck: No emergent large vessel occlusion. Minimal atherosclerotic changes within the proximal right ICA and cavernous internal carotid arteries bilaterally without significant stenosis. Multiple sclerotic lesions throughout the cervical and upper thoracic spine and ribs bilaterally consistent with metastatic disease.  MRI  unable to obtain d/t non-MRI-compatible pacemaker 2D Echo LVEF 30% with apical kinesis and diffuse hypokinesis  TEE pending. LDL 98 HgbA1c 5.6 VTE prophylaxis - SCDs    Diet   Diet Heart Room service appropriate? Yes; Fluid consistency: Thin   Diet NPO time specified Except for: Sips with Meds   No antithrombotic prior to admission.  On no antithrombotic at present because of frank hematuria and thrombocytopenia Therapy recommendations:  CLR Disposition:  pending  Hypertension Home meds:  valsartan Permissive hypertension (OK if < 220/120) but gradually normalize in 5-7 days Long-term BP goal normotensive  Hyperlipidemia Home meds:  lipitor '20mg'$ , resumed in hospital LDL 98, goal < 70 Continue statin at discharge  Other Stroke Risk Factors Advanced Age >/= 63  Obesity, Body mass index is 30.8 kg/m., BMI >/= 30 associated with increased stroke risk, recommend weight loss, diet and exercise as appropriate  Family hx stroke (mother)  Other Active Problems (per Primary Team) Prostate Cancer Leukopenia  (2.4->5.4->3.9) Urinary retention, hematuria  Urology consulted Anemia (7.2->7.3->8.2->7.7)   Hospital day # 5   Patient has  presented with bicerebral embolic strokes from new onset A-fib but unfortunately is not a candidate for anticoagulation at the present time due to ongoing hematuria from radiation proctitis as well as 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  He is not a long-term good anticoagulation candidate and may not be a good candidate for Watchman device either due to his metastatic prostate cancer and limited life expectancy.  Consideration for palliative care consultation for discussion about goals of care is recommended.  Long discussion with patient and his wife Dr. Regis Mercado as well as Dr. Emeline Gins and answered questions.  May consider starting aspirin 81 mg daily once platelet count is stable above 50,000.  Stroke team will sign off.  Kindly call for questions.  Greater than 50% time during this 35-minute visit was spent in counseling and coordination of care about his embolic  strokes, atrial fibrillation, discussion about risk benefit of anticoagulation and alternatives Watchman device discussion about goals of care and answering questions.  Devin Corti,MD   To contact Stroke Continuity provider, please refer to [email or web address] After hours, contact General Neurology

## 2022-06-17 NOTE — Care Management Important Message (Signed)
Important Message  Patient Details  Name: Devin Mercado MRN: [id number] Date of Birth: October 11, 1950   Medicare Important Message Given:  Yes     [name] 06/17/2022, 3:20 PM

## 2022-06-17 NOTE — Progress Notes (Signed)
PROGRESS NOTE    Devin Mercado  TWS:[id number] DOB: [birth year] DOA: 05/31/2022 PCP: [name], MD     Brief Narrative:   Devin Mercado is a 71 year old male with past medical history significant for prostate cancer with bone mets, followed by oncology at [facility], anemia associated with bone marrow infiltration, pancytopenia from bone marrow suppression from Pluv[place].  Who presents to the hospital with hematuria.  Foley catheter was placed and bladder irrigation was started.  Patient admitted and urology consulted.  Status post CBI, Foley catheter removed 12/2, patient was transferred to [facility] 12/2 after code stroke was called as he noted to have right upper extremity weakness, with CT head significant for embolic CVA.  Able to obtain MRI due to pacemaker, pacemaker interrogation was significant for A-fib, followed by cardiology, unfortunately not a candidate for anticoagulation given his hematuria, acute blood loss anemia and thrombocytopenia.  New events last 24 hours / Subjective:  He remains afebrile, he did develop retention and hematuria overnight, he received 2 units platelets yesterday, and 1 unit packed red blood cell overnight  Assessment & Plan:   Principal Problem:   Hematuria Active Problems:   Hyperlipidemia   Essential hypertension   Atrioventricular block, complete (HCC)   PPM-[employer]   Prostate cancer (HCC)   Malignant neoplasm metastatic to bone [facility])   Acute urinary retention   Hyponatremia   Hypokalemia   Pancytopenia (HCC)   Cerebrovascular accident (CVA) due to embolism of left posterior cerebral artery ([place])   Acute urinary retention with hematuria, hemorrhagic cystitis -Urology following -Continuous bladder irrigation discontinued 12/2.  -Flomax -Patient developed further urinary retention and hematuria overnight.  Acute CVA -Management per neurology team, felt to be secondary to embolic source, A fib -The echo with depressed  EF to 30%. -PPM interrogation showing A-fib -Not on antiplatelet  or anticoagulation therapy due to thrombocytopenia and anemia.  Paroxysmal A-fib -Not a candidate for anticoagulation -Management per cardiology, started on amiodarone.  Acute systolic CHF -Compensated -2D echo with wall regional wall motion abnormalities -Further management per cardiology. -Consider Entresto and beta-blocker once out of permissive hypertension window . Acute blood loss anemia, pancytopenia, symptomatic anemia -Transfused 4  units platelets -Transfused 3 unit packed red blood cell -Continue to monitor CBC closely and transfuse as needed. -At least is low at 25 K today, likely will need transfusion over the next 24 to 48 hours to get his platelet count> 50 K to get her TEE on Thursday   Fever -Fever of 103 on 12/2 -Chest x-ray without acute cardiopulmonary process -UA was unremarkable.   -Negative GI panel -Cultures remain negative -Need to monitor off antibiotics -plan for TEE on Thursday.   Metastatic prostate cancer to bone, pancytopenia -Followed by oncology at [facility] -Prednisone   Hypertension -On valsartan, will hold to allow for permissive hypertension   Hyperlipidemia -Atorvastatin   Euthyroid sick syndrome -Elevated TSH but normal T4, repeat labs confirm the same, for now I will hold on initiating any Synthroid specially with new onset A-fib, and the T4 remains within normal limit, and he is asymptomatic, but this will need to be monitored closely.   Hyponatremia -Improving  Hypocalcemia  -Repleted  Hypokalemia -repelted , recheck in a.m.   DVT prophylaxis:  SCDs Start: 06/13/22 0135  Code Status: Full code Family Communication: Discussed with wife Dr. [name] at bedside Disposition Plan:  Status is: Inpatient Remains inpatient appropriate because: Giving blood transfusion today  Consults: Urology Cardiology ID  Antimicrobials:  Anti-infectives (From admission,  onward)    Start     Dose/Rate Route Frequency Ordered Stop   06/14/22 0900  ciprofloxacin (CIPRO) tablet 500 mg  Status:  Discontinued        500 mg Oral 2 times daily 06/14/22 0813 06/15/22 0927        Objective: Vitals:   06/17/22 0232 06/17/22 0300 06/17/22 0330 06/17/22 0736  BP: 117/73 132/70 123/65 136/79  Pulse: 67 72 71 81  Resp: '16 16 18 20  '$ Temp: 98.5 F (36.9 C) 98.4 F (36.9 C) 98.5 F (36.9 C) 98.7 F (37.1 C)  TempSrc: Oral Axillary Axillary Oral  SpO2: 98% 100% 97% 99%  Weight:      Height:        Intake/Output Summary (Last 24 hours) at 06/17/2022 0946 Last data filed at 06/17/2022 0900 Gross per 24 hour  Intake 2216.71 ml  Output 900 ml  Net 1316.71 ml   Filed Weights   05/24/2022 1716 06/08/2022 2359  Weight: 102.1 kg 103 kg    Examination:   Awake Alert, Oriented X 3, No new F.N deficits, Normal affect Symmetrical Chest wall movement, Good air movement bilaterally, CTAB RRR,No Gallops,Rubs or new Murmurs, No Parasternal Heave +ve B.Sounds, Abd Soft, mild suprapubic tenderness, No rebound - guarding or rigidity. No Cyanosis, Clubbing or edema, No new Rash or bruise      Data Reviewed: I have personally reviewed following labs and imaging studies  CBC: Recent Labs  Lab 06/13/22 1901 06/14/22 0709 06/14/22 0844 06/14/22 2302 06/15/22 0814 06/16/22 0715 06/16/22 2309 06/17/22 0654  WBC 3.6*  --  2.4* 5.4 3.9* 3.7* 3.8* 3.5*  NEUTROABS 1.9  --  1.4*  --   --   --   --   --   HGB 8.1*   < > 7.3* 8.2* 7.7* 7.6* 6.9* 8.3*  HCT 24.6*   < > 22.5* 23.8* 23.0* 21.5* 20.4* 23.1*  MCV 85.4  --  86.2 82.4 82.4 80.2 82.3 80.8  PLT 80*  --  43* 41* 33* 25* 53* 44*   < > = values in this interval not displayed.   Basic Metabolic Panel: Recent Labs  Lab 06/13/22 1901 06/14/22 0254 06/15/22 0814 06/16/22 0715 06/17/22 0654  NA 125* 132* 130* 129* 134*  K 4.3 4.0 3.5 3.2* 4.1  CL 98 103 102 100 107  CO2 18* 20* 18* 17* 18*  GLUCOSE 142* 110*  109* 114* 102*  BUN 30* 28* 28* 32* 32*  CREATININE 1.17 1.12 1.43* 1.18 1.11  CALCIUM 6.9* 6.9* 6.4* 6.6* 7.1*  MG 2.1  --   --   --   --    GFR: Estimated Creatinine Clearance: 73.6 mL/min (by C-G formula based on SCr of 1.11 mg/dL). Liver Function Tests: Recent Labs  Lab 05/19/2022 1720  AST 18  ALT 11  ALKPHOS 70  BILITOT 0.6  PROT 6.4*  ALBUMIN 3.6   Recent Labs  Lab 06/10/2022 1720  LIPASE 10*   No results for input(s): "AMMONIA" in the last 168 hours. Coagulation Profile: Recent Labs  Lab 06/13/22 1901  INR 1.7*   Cardiac Enzymes: No results for input(s): "CKTOTAL", "CKMB", "CKMBINDEX", "TROPONINI" in the last 168 hours. BNP (last 3 results) No results for input(s): "PROBNP" in the last 8760 hours. HbA1C: No results for input(s): "HGBA1C" in the last 72 hours. CBG: Recent Labs  Lab 06/14/22 1056 06/14/22 1537  GLUCAP 121* 172*   Lipid Profile: No results for input(s): "CHOL", "  HDL", "LDLCALC", "TRIG", "CHOLHDL", "LDLDIRECT" in the last 72 hours. Thyroid Function Tests: Recent Labs    06/16/22 0748  TSH 19.164*  FREET4 0.69    Anemia Panel: No results for input(s): "VITAMINB12", "FOLATE", "FERRITIN", "TIBC", "IRON", "RETICCTPCT" in the last 72 hours. Sepsis Labs: Recent Labs  Lab 06/14/22 0254 06/15/22 0814 06/16/22 0715 06/17/22 0654  PROCALCITON  --  14.08 7.37 4.00  LATICACIDVEN 1.0  --   --   --     Recent Results (from the past 240 hour(s))  Culture, blood (Routine X 2) w Reflex to ID Panel     Status: None (Preliminary result)   Collection Time: 06/13/22  7:01 PM   Specimen: BLOOD  Result Value Ref Range Status   Specimen Description   Final    BLOOD LEFT ANTECUBITAL Performed at [facility], [place] [address] [place], [place] [address]    Special Requests   Final    BOTTLES DRAWN AEROBIC ONLY Blood Culture adequate volume Performed at [place] [address] [place], [place] [address]     Culture   Final    NO GROWTH 4 DAYS Performed at [facility], [place] [address] [place], [place] [address]    Report Status PENDING  Incomplete  Culture, blood (Routine X 2) w Reflex to ID Panel     Status: None (Preliminary result)   Collection Time: 06/13/22  7:04 PM   Specimen: BLOOD  Result Value Ref Range Status   Specimen Description   Final    BLOOD BLOOD RIGHT HAND Performed at [place] [address], [place], [place] [address]    Special Requests   Final    BOTTLES DRAWN AEROBIC ONLY Blood Culture results may not be optimal due to an inadequate volume of blood received in culture bottles Performed at [facility] [address], [place], [place] [address]    Culture   Final    NO GROWTH 4 DAYS Performed at [facility], [place] [address], [place], [place] [address]    Report Status PENDING  Incomplete  Gastrointestinal Panel by PCR , Stool     Status: None   Collection Time: 06/16/22  4:01 AM   Specimen: Stool  Result Value Ref Range Status   Campylobacter species NOT DETECTED NOT DETECTED Final   Plesimonas shigelloides NOT DETECTED NOT DETECTED Final   Salmonella species NOT DETECTED NOT DETECTED Final   Yersinia enterocolitica NOT DETECTED NOT DETECTED Final   Vibrio species NOT DETECTED NOT DETECTED Final   Vibrio cholerae NOT DETECTED NOT DETECTED Final   Enteroaggregative E coli (EAEC) NOT DETECTED NOT DETECTED Final   Enteropathogenic E coli (EPEC) NOT DETECTED NOT DETECTED Final   Enterotoxigenic E coli (ETEC) NOT DETECTED NOT DETECTED Final   Shiga like toxin producing E coli (STEC) NOT DETECTED NOT DETECTED Final   Shigella/Enteroinvasive E coli (EIEC) NOT DETECTED NOT DETECTED Final   Cryptosporidium NOT DETECTED NOT DETECTED Final   Cyclospora cayetanensis NOT DETECTED NOT DETECTED Final   Entamoeba histolytica NOT DETECTED NOT DETECTED Final   Giardia lamblia NOT DETECTED NOT DETECTED Final    Adenovirus F40/41 NOT DETECTED NOT DETECTED Final   Astrovirus NOT DETECTED NOT DETECTED Final   Norovirus GI/GII NOT DETECTED NOT DETECTED Final   Rotavirus A NOT DETECTED NOT DETECTED Final   Sapovirus (I, II, IV, and V) NOT DETECTED NOT DETECTED Final    Comment: Performed at [facility], [address], [place], [place] [address]      Radiology Studies: CT HEAD WO CONTRAST (5MM)  Result Date: 06/15/2022 CLINICAL DATA:  Stroke follow-up EXAM: CT HEAD WITHOUT CONTRAST TECHNIQUE: Contiguous axial images were obtained from the base of the skull through the vertex without intravenous contrast. RADIATION DOSE REDUCTION: This exam was performed according to the departmental dose-optimization program which includes automated exposure control, adjustment of the mA and/or kV according to patient size and/or use of iterative reconstruction technique. COMPARISON:  06/14/2022 FINDINGS: Brain: There are areas of loss of gray-white differentiation in the left occipital lobe and right parietal lobe, unchanged. No acute hemorrhage. Mild generalized volume loss. There is periventricular hypoattenuation compatible with chronic microvascular disease. Incidentally noted cavum septum pellucidum et vergae. Vascular: No hyperdense vessel or unexpected calcification. Skull: Normal. Negative for fracture or focal lesion. Sinuses/Orbits: No acute finding. Other: None. IMPRESSION: Unchanged areas of loss of gray-white differentiation in the left occipital lobe and right parietal lobe, consistent with acute/subacute infarcts. No acute hemorrhage. Electronically Signed   By: Ulyses Jarred M.D.   On: 06/15/2022 21:54   ECHOCARDIOGRAM COMPLETE  Result Date: 06/15/2022    ECHOCARDIOGRAM REPORT   Patient Name:   Devin Mercado [facility] Date of Exam: 06/15/2022 Medical Rec #:  [id number]        Height:       72.0 in Accession #:    [id number]       Weight:       227.1 lb Date of Birth:  [birth year]         BSA:          2.249 m  Patient Age:    41 years         BP:           120/59 mmHg Patient Gender: M                HR:           88 bpm. Exam Location:  Inpatient Procedure: 2D Echo, Color Doppler and Cardiac Doppler Indications:    Stroke i63.9  History:        Patient has no prior history of Echocardiogram examinations.                 Pacemaker; Risk Factors:Hypertension and Dyslipidemia.  Sonographer:    Raquel Sarna Senior RDCS Referring Phys: [phone] [facility]  Sonographer Comments: Technically difficult due to poor echo windows. IMPRESSIONS  1. Difficult acoustic windows limit study Would recomm limited echo with Definity to confirm LVEF and wall motion. There appears to be hypokinesis of the mid/distal lateral, mid/distal septal, distal inferior walls and apical akinesis.. Left ventricular  ejection fraction, by estimation, is 30%. The left ventricle has moderately decreased function.  2. Right ventricular systolic function is normal. The right ventricular size is normal. There is normal pulmonary artery systolic pressure.  3. The mitral valve is normal in structure. Trivial mitral valve regurgitation.  4. The aortic valve is normal in structure. Aortic valve regurgitation is not visualized.  5. There is mild dilatation of the ascending aorta, measuring 42 mm.  6. The inferior vena cava is normal in size with greater than 50% respiratory variability, suggesting right atrial pressure of 3 mmHg. FINDINGS  Left Ventricle: Difficult acoustic windows limit study Would recomm limited echo with Definity to confirm LVEF and wall motion. There appears to be hypokinesis of the mid/distal lateral, mid/distal septal, distal inferior walls and apical akinesis.  Left  ventricular ejection fraction, by estimation, is 30%. The left ventricle has moderately decreased function. The left ventricular internal cavity size was normal in size. There is no left ventricular hypertrophy. Right Ventricle: The right ventricular size is normal. Right vetricular  wall thickness was not assessed. Right ventricular systolic function is normal. There is normal pulmonary artery systolic pressure. The tricuspid regurgitant velocity is 2.31 m/s, and with an assumed right atrial pressure of 3 mmHg, the estimated right ventricular systolic pressure is 62.7 mmHg. Left Atrium: Left atrial size was normal in size. Right Atrium: Right atrial size was normal in size. Pericardium: Trivial pericardial effusion is present. Mitral Valve: The mitral valve is normal in structure. Trivial mitral valve regurgitation. Tricuspid Valve: The tricuspid valve is grossly normal. Tricuspid valve regurgitation is mild. Aortic Valve: The aortic valve is normal in structure. Aortic valve regurgitation is not visualized. Pulmonic Valve: The pulmonic valve was not well visualized. Pulmonic valve regurgitation is not visualized. No evidence of pulmonic stenosis. Aorta: There is mild dilatation of the ascending aorta, measuring 42 mm. Venous: The inferior vena cava is normal in size with greater than 50% respiratory variability, suggesting right atrial pressure of 3 mmHg. IAS/Shunts: No atrial level shunt detected by color flow Doppler. Additional Comments: A device lead is visualized.  LEFT VENTRICLE PLAX 2D LVIDd:         5.50 cm LVIDs:         3.30 cm LV PW:         1.10 cm LV IVS:        1.20 cm LVOT diam:     2.40 cm LV SV:         85 LV SV Index:   38 LVOT Area:     4.52 cm  LV Volumes (MOD) LV vol d, MOD A2C: 112.0 ml LV vol d, MOD A4C: 139.0 ml LV vol s, MOD A2C: 65.0 ml LV vol s, MOD A4C: 72.7 ml LV SV MOD A2C:     47.0 ml LV SV MOD A4C:     139.0 ml LV SV MOD BP:      56.6 ml RIGHT VENTRICLE RV S prime:     17.70 cm/s TAPSE (M-mode): 2.2 cm LEFT ATRIUM             Index        RIGHT ATRIUM           Index LA diam:        3.60 cm 1.60 cm/m   RA Area:     21.10 cm LA Vol (A2C):   66.4 ml 29.53 ml/m  RA Volume:   60.60 ml  26.95 ml/m LA Vol (A4C):   63.5 ml 28.24 ml/m LA Biplane Vol: 65.8 ml 29.26  ml/m  AORTIC VALVE LVOT Vmax:   102.00 cm/s LVOT Vmean:  71.100 cm/s LVOT VTI:    0.188 m  AORTA Ao Root diam: 4.00 cm Ao Asc diam:  4.20 cm TRICUSPID VALVE TR Peak grad:   21.3 mmHg TR Vmax:        231.00 cm/s  SHUNTS Systemic VTI:  0.19 m Systemic Diam: 2.40 cm Dorris Carnes MD Electronically signed by Dorris Carnes MD Signature Date/Time: 06/15/2022/4:20:16 PM    Final       Scheduled Meds:  sodium chloride   Intravenous Once   amiodarone  200 mg Oral BID   atorvastatin  20 mg Oral Daily   famotidine  20 mg Oral Daily  lidocaine  1 patch Transdermal Q24H   predniSONE  10 mg Oral q morning   sodium bicarbonate  650 mg Oral TID   tamsulosin  0.4 mg Oral QPC supper   Continuous Infusions:  sodium chloride Stopped (06/16/22 1409)     LOS: 5 days     Phillips Climes, MD [facility] 06/17/2022, 9:46 AM   Available via Epic secure chat 7am-7pm After these hours, please refer to coverage provider listed on [email or web address]

## 2022-06-17 NOTE — Progress Notes (Addendum)
Rounding Note    Patient Name: Devin Mercado Date of Encounter: 06/17/2022  [facility] Cardiologist: Cristopher Peru, MD   Subjective   Neuro deficits are improving by his observation, feels weak, tired in general  Inpatient Medications    Scheduled Meds:  sodium chloride   Intravenous Once   amiodarone  200 mg Oral BID   atorvastatin  20 mg Oral Daily   famotidine  20 mg Oral Daily   lidocaine  1 patch Transdermal Q24H   predniSONE  10 mg Oral q morning   sodium bicarbonate  650 mg Oral TID   tamsulosin  0.4 mg Oral QPC supper   Continuous Infusions:  sodium chloride Stopped (06/16/22 1409)   PRN Meds: acetaminophen **OR** acetaminophen, LORazepam, morphine injection, oxyCODONE, senna-docusate, traMADol   Vital Signs    Vitals:   06/17/22 0232 06/17/22 0300 06/17/22 0330 06/17/22 0736  BP: 117/73 132/70 123/65 136/79  Pulse: 67 72 71 81  Resp: '16 16 18 20  '$ Temp: 98.5 F (36.9 C) 98.4 F (36.9 C) 98.5 F (36.9 C) 98.7 F (37.1 C)  TempSrc: Oral Axillary Axillary Oral  SpO2: 98% 100% 97% 99%  Weight:      Height:        Intake/Output Summary (Last 24 hours) at 06/17/2022 0904 Last data filed at 06/17/2022 0300 Gross per 24 hour  Intake 2216.71 ml  Output 300 ml  Net 1916.71 ml      06/08/2022   11:59 PM 05/15/2022    5:16 PM 05/19/2022    9:50 AM  Last 3 Weights  Weight (lbs) 227 lb 1.2 oz 225 lb 223 lb 9.6 oz  Weight (kg) 103 kg 102.059 kg 101.424 kg      Telemetry    SR/V paced - Personally Reviewed  ECG    No new EKGs - Personally Reviewed  Physical Exam   GEN: No acute distress.   Neck: No JVD Cardiac: RRR, no murmurs, rubs, or gallops.  Respiratory: CTA b/l GI: Soft, nontender, non-distended  MS: No edema; No deformity. Psych: Normal affect   Labs    High Sensitivity Troponin:  No results for input(s): "TROPONINIHS" in the last 720 hours.   Chemistry Recent Labs  Lab 05/15/2022 1720 06/13/22 1901 06/14/22 0254  06/15/22 0814 06/16/22 0715 06/17/22 0654  NA 127* 125*   < > 130* 129* 134*  K 4.4 4.3   < > 3.5 3.2* 4.1  CL 96* 98   < > 102 100 107  CO2 20* 18*   < > 18* 17* 18*  GLUCOSE 94 142*   < > 109* 114* 102*  BUN 36* 30*   < > 28* 32* 32*  CREATININE 1.24 1.17   < > 1.43* 1.18 1.11  CALCIUM 7.7* 6.9*   < > 6.4* 6.6* 7.1*  MG  --  2.1  --   --   --   --   PROT 6.4*  --   --   --   --   --   ALBUMIN 3.6  --   --   --   --   --   AST 18  --   --   --   --   --   ALT 11  --   --   --   --   --   ALKPHOS 70  --   --   --   --   --   BILITOT 0.6  --   --   --   --   --  GFRNONAA >60 >60   < > 52* >60 >60  ANIONGAP 11 9   < > '10 12 9   '$ < > = values in this interval not displayed.    Lipids No results for input(s): "CHOL", "TRIG", "HDL", "LABVLDL", "LDLCALC", "CHOLHDL" in the last 168 hours.  Hematology Recent Labs  Lab 06/16/22 0715 06/16/22 2309 06/17/22 0654  WBC 3.7* 3.8* 3.5*  RBC 2.68* 2.48* 2.86*  HGB 7.6* 6.9* 8.3*  HCT 21.5* 20.4* 23.1*  MCV 80.2 82.3 80.8  MCH 28.4 27.8 29.0  MCHC 35.3 33.8 35.9  RDW 17.2* 17.2* 17.0*  PLT 25* 53* 44*   Thyroid  Recent Labs  Lab 06/16/22 0748  TSH 19.164*  FREET4 0.69    BNPNo results for input(s): "BNP", "PROBNP" in the last 168 hours.  DDimer No results for input(s): "DDIMER" in the last 168 hours.   Radiology      Cardiac Studies   06/15/22: TTE 1. Difficult acoustic windows limit study Would recomm limited echo with  Definity to confirm LVEF and wall motion. There appears to be hypokinesis  of the mid/distal lateral, mid/distal septal, distal inferior walls and  apical akinesis.. Left ventricular   ejection fraction, by estimation, is 30%. The left ventricle has  moderately decreased function.   2. Right ventricular systolic function is normal. The right ventricular  size is normal. There is normal pulmonary artery systolic pressure.   3. The mitral valve is normal in structure. Trivial mitral valve  regurgitation.    4. The aortic valve is normal in structure. Aortic valve regurgitation is  not visualized.   5. There is mild dilatation of the ascending aorta, measuring 42 mm.   6. The inferior vena cava is normal in size with greater than 50%  respiratory variability, suggesting right atrial pressure of 3 mmHg.   Patient Profile     71 y.o. male w/PMHx of HTN, CHB w/PPM, prostate cancer with mets (bone) admitted with stroke, interrogation of his PPM noted new onset Afib  Ongoing struggles with hematuria/hemorrhagic cystitis  Assessment & Plan    New paroxysmal AFib CHA2DS2Vasc is 5, unable to anticoagulate with ongoing hematuria and thrombocytopenia  Dr Lovena Le discussed at length today with patient and his wife at bedside Will start amiodarone '200mg'$  BID in effort to suppress AF Elevated TSH noted with normal Free T4, ?sick euthyroid > treat as per IM team   2. New CM Unknown etiology, + WMA Compensated No ischemic w/u at this time though can revisit out patient  Look towards adding BB/Entresto as his clinical course progresses, BP allows, and when neurology cleared for BP normalization  Still getting blood and platelets   3. Recurrent fever for weeks/months ID on board Not felt to be infectious BC so fare are neg (3 days) x2 We still feel TEE is indicated given PPM and immunocompromised state (chemo), Dr. Lovena Le d/w pt/wife  For questions or updates, please contact [place] Please consult [email or web address] for contact info under     Signed, Baldwin Jamaica, PA-C  06/17/2022, 9:04 AM    EP Attending  Patient seen and examined. He appears in better spirits today. His hematuria appears improved. Note transfusion. Agree. His exam reveals a RRR and lungs are clear. Ext with trace edema. His tele demonstrates NSR with ventricular pacing.  A/P PAF - he has not had any in last 72 hours. I have reviewed the treatment options and I recommend initiation of amiodarone to try and  prevent atrial fib from recurring Coags - he is a very poor candidate for an [place] due to thrombocytopenia and likelihood of more urological bleeding. He might be a candidate for a [place] in the future.  New LV dysfunction - he will need to be placed on low dose GDMT prior to DC.  Fever of unknown origin - cultures remain sterile. I have would like a TEE. I have had seen several cases of culture negative endocarditis involving pacemaker leads.   Carleene Overlie Presleigh Feldstein,MD

## 2022-06-17 NOTE — Progress Notes (Signed)
Patient ID: Devin Mercado, male   DOB: Feb 21, 1951, 71 y.o.   MRN: [id number]    Subjective: Pt developed recurrent gross hematuria overnight with clots and intermittent clot urinary retention requiring in and out catheterization.  Since this morning he has voided twice (600 cc and 400 cc) with urine being mostly tea colored but with clots.  Objective: Vital signs in last 24 hours: Temp:  [98.3 F (36.8 C)-100 F (37.8 C)] 99 F (37.2 C) (12/05 1209) Pulse Rate:  [67-98] 91 (12/05 1209) Resp:  [14-20] 14 (12/05 1209) BP: (117-157)/(65-88) 157/88 (12/05 1209) SpO2:  [97 %-100 %] 98 % (12/05 1209)  Intake/Output from previous day: 12/04 0701 - 12/05 0700 In: 2216.7 [P.O.:600; I.V.:637; Blood:953.1; IV Piggyback:26.6] Out: 300 [Urine:300] Intake/Output this shift: Total I/O In: 320 [P.O.:320] Out: 600 [Urine:600]  Physical Exam:  General: Alert and oriented Abd: Soft, ND  Lab Results: Recent Labs    06/16/22 0715 06/16/22 2309 06/17/22 0654  HGB 7.6* 6.9* 8.3*  HCT 21.5* 20.4* 23.1*   BMET Recent Labs    06/16/22 0715 06/17/22 0654  NA 129* 134*  K 3.2* 4.1  CL 100 107  CO2 17* 18*  GLUCOSE 114* 102*  BUN 32* 32*  CREATININE 1.18 1.11  CALCIUM 6.6* 7.1*     Studies/Results: CT HEAD WO CONTRAST (5MM)  Result Date: 06/15/2022 CLINICAL DATA:  Stroke follow-up EXAM: CT HEAD WITHOUT CONTRAST TECHNIQUE: Contiguous axial images were obtained from the base of the skull through the vertex without intravenous contrast. RADIATION DOSE REDUCTION: This exam was performed according to the departmental dose-optimization program which includes automated exposure control, adjustment of the mA and/or kV according to patient size and/or use of iterative reconstruction technique. COMPARISON:  06/14/2022 FINDINGS: Brain: There are areas of loss of gray-white differentiation in the left occipital lobe and right parietal lobe, unchanged. No acute hemorrhage. Mild generalized volume  loss. There is periventricular hypoattenuation compatible with chronic microvascular disease. Incidentally noted cavum septum pellucidum et vergae. Vascular: No hyperdense vessel or unexpected calcification. Skull: Normal. Negative for fracture or focal lesion. Sinuses/Orbits: No acute finding. Other: None. IMPRESSION: Unchanged areas of loss of gray-white differentiation in the left occipital lobe and right parietal lobe, consistent with acute/subacute infarcts. No acute hemorrhage. Electronically Signed   By: Ulyses Jarred M.D.   On: 06/15/2022 21:54   ECHOCARDIOGRAM COMPLETE  Result Date: 06/15/2022    ECHOCARDIOGRAM REPORT   Patient Name:   Devin Mercado [facility] Date of Exam: 06/15/2022 Medical Rec #:  [id number]        Height:       72.0 in Accession #:    [id number]       Weight:       227.1 lb Date of Birth:  [birth year]         BSA:          2.249 m Patient Age:    66 years         BP:           120/59 mmHg Patient Gender: M                HR:           88 bpm. Exam Location:  Inpatient Procedure: 2D Echo, Color Doppler and Cardiac Doppler Indications:    Stroke i63.9  History:        Patient has no prior history of Echocardiogram examinations.  Pacemaker; Risk Factors:Hypertension and Dyslipidemia.  Sonographer:    Raquel Sarna Senior RDCS Referring Phys: [phone] [facility]  Sonographer Comments: Technically difficult due to poor echo windows. IMPRESSIONS  1. Difficult acoustic windows limit study Would recomm limited echo with Definity to confirm LVEF and wall motion. There appears to be hypokinesis of the mid/distal lateral, mid/distal septal, distal inferior walls and apical akinesis.. Left ventricular  ejection fraction, by estimation, is 30%. The left ventricle has moderately decreased function.  2. Right ventricular systolic function is normal. The right ventricular size is normal. There is normal pulmonary artery systolic pressure.  3. The mitral valve is normal in structure. Trivial mitral  valve regurgitation.  4. The aortic valve is normal in structure. Aortic valve regurgitation is not visualized.  5. There is mild dilatation of the ascending aorta, measuring 42 mm.  6. The inferior vena cava is normal in size with greater than 50% respiratory variability, suggesting right atrial pressure of 3 mmHg. FINDINGS  Left Ventricle: Difficult acoustic windows limit study Would recomm limited echo with Definity to confirm LVEF and wall motion. There appears to be hypokinesis of the mid/distal lateral, mid/distal septal, distal inferior walls and apical akinesis. Left  ventricular ejection fraction, by estimation, is 30%. The left ventricle has moderately decreased function. The left ventricular internal cavity size was normal in size. There is no left ventricular hypertrophy. Right Ventricle: The right ventricular size is normal. Right vetricular wall thickness was not assessed. Right ventricular systolic function is normal. There is normal pulmonary artery systolic pressure. The tricuspid regurgitant velocity is 2.31 m/s, and with an assumed right atrial pressure of 3 mmHg, the estimated right ventricular systolic pressure is 56.3 mmHg. Left Atrium: Left atrial size was normal in size. Right Atrium: Right atrial size was normal in size. Pericardium: Trivial pericardial effusion is present. Mitral Valve: The mitral valve is normal in structure. Trivial mitral valve regurgitation. Tricuspid Valve: The tricuspid valve is grossly normal. Tricuspid valve regurgitation is mild. Aortic Valve: The aortic valve is normal in structure. Aortic valve regurgitation is not visualized. Pulmonic Valve: The pulmonic valve was not well visualized. Pulmonic valve regurgitation is not visualized. No evidence of pulmonic stenosis. Aorta: There is mild dilatation of the ascending aorta, measuring 42 mm. Venous: The inferior vena cava is normal in size with greater than 50% respiratory variability, suggesting right atrial  pressure of 3 mmHg. IAS/Shunts: No atrial level shunt detected by color flow Doppler. Additional Comments: A device lead is visualized.  LEFT VENTRICLE PLAX 2D LVIDd:         5.50 cm LVIDs:         3.30 cm LV PW:         1.10 cm LV IVS:        1.20 cm LVOT diam:     2.40 cm LV SV:         85 LV SV Index:   38 LVOT Area:     4.52 cm  LV Volumes (MOD) LV vol d, MOD A2C: 112.0 ml LV vol d, MOD A4C: 139.0 ml LV vol s, MOD A2C: 65.0 ml LV vol s, MOD A4C: 72.7 ml LV SV MOD A2C:     47.0 ml LV SV MOD A4C:     139.0 ml LV SV MOD BP:      56.6 ml RIGHT VENTRICLE RV S prime:     17.70 cm/s TAPSE (M-mode): 2.2 cm LEFT ATRIUM  Index        RIGHT ATRIUM           Index LA diam:        3.60 cm 1.60 cm/m   RA Area:     21.10 cm LA Vol (A2C):   66.4 ml 29.53 ml/m  RA Volume:   60.60 ml  26.95 ml/m LA Vol (A4C):   63.5 ml 28.24 ml/m LA Biplane Vol: 65.8 ml 29.26 ml/m  AORTIC VALVE LVOT Vmax:   102.00 cm/s LVOT Vmean:  71.100 cm/s LVOT VTI:    0.188 m  AORTA Ao Root diam: 4.00 cm Ao Asc diam:  4.20 cm TRICUSPID VALVE TR Peak grad:   21.3 mmHg TR Vmax:        231.00 cm/s  SHUNTS Systemic VTI:  0.19 m Systemic Diam: 2.40 cm Dorris Carnes MD Electronically signed by Dorris Carnes MD Signature Date/Time: 06/15/2022/4:20:16 PM    Final     Assessment/Plan: 1) Radiation cystitis with gross hematuria: He has had recurrent hematuria requiring transfusion but improving today.  Platelets transfused yesterday due to severe thrombocytopenia.  I offered Mr. Craton the option of hematuria catheter replacement vs continued observation right now.  He has had a lot of experience with these symptoms and feels that he is voiding well enough right now and would like to avoid a catheter at this time.  Will collect samples of voided urines (discussed with nursing) to assess progress.  May in and out catheterize if patient develops bladder fullness and retention.  Will reassess in the morning.  Please call on call urologist if more severe  difficulties tonight. Hyperbaric oxygen therapy is pending to being on 12/19.   LOS: 5 days   [name] 06/17/2022, 1:06 PM

## 2022-06-17 NOTE — Progress Notes (Signed)
Sent secured message to Dr Velia Meyer, informed that urine retention based on bladder scan was 401 ml, pt has a feeling of urination and the bladder was slightly distended

## 2022-06-17 NOTE — Progress Notes (Signed)
Started 1 unit PRBC with unit number [id number], A positive with expiry date12/18/2023 at [place]

## 2022-06-17 NOTE — Progress Notes (Signed)
Occupational Therapy Treatment Patient Details Name: Devin Mercado MRN: [id number] DOB: October 28, 1950 Today's Date: 06/17/2022   History of present illness This is a 71 year old male presents with hematuria. 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 PHMx: prostate cancer with bone mets, anemia, pancytopenia. L Reverse TSA April 2023   OT comments  Pt making good progress towards OT goals this session. RUE, while ataxia still present - especially outside of base of support - has improved. Pt required min A for sit<>stand transfers today with good push up through BUE. Focus of session was on education for HEP for fine motor control as well as theraputty (provided yellow - soft) full handouts attached in Epic and generalized below in exercises. Also educated Pt and wife in vision strategies as listed below - although Pt reports improving vision. OT will continue to follow acutely with AIR remaining essential to maximize safety and independence in ADL and functional transfers.    Recommendations for follow up therapy are one component of a multi-disciplinary discharge planning process, led by the attending physician.  Recommendations may be updated based on patient status, additional functional criteria and insurance authorization.    Follow Up Recommendations  Acute inpatient rehab (3hours/day)     Assistance Recommended at Discharge Frequent or constant Supervision/Assistance  Patient can return home with the following  A lot of help with walking and/or transfers;A lot of help with bathing/dressing/bathroom;Assistance with cooking/housework;Assist for transportation;Help with stairs or ramp for entrance;Direct supervision/assist for financial management;Direct supervision/assist for medications management   Equipment Recommendations  Other (comment) (defer to next venue)     Recommendations for Other Services Rehab consult    Precautions / Restrictions Precautions Precautions: Fall Precaution Comments: L TSA 10/2021 - very painful Restrictions Weight Bearing Restrictions: No       Mobility Bed Mobility               General bed mobility comments: OOB in recliner at beginning and end of session    Transfers Overall transfer level: Needs assistance Equipment used: Rolling walker (2 wheels) Transfers: Sit to/from Stand Sit to Stand: Min assist           General transfer comment: good pushing through BUE off chair for power up     Balance Overall balance assessment: Needs assistance Sitting-balance support: Feet supported, No upper extremity supported Sitting balance-Leahy Scale: Fair     Standing balance support: Bilateral upper extremity supported, During functional activity, Reliant on assistive device for balance Standing balance-Leahy Scale: Poor Standing balance comment: reliance on RW for support                           ADL either performed or assessed with clinical judgement   ADL Overall ADL's : Needs assistance/impaired Eating/Feeding: Supervision/ safety;Set up;Sitting Eating/Feeding Details (indicate cue type and reason): Pt reaching out to grasp cup, ataxia noted - Pt reports improved from initial deficits                     Toilet Transfer: Minimal assistance;Rolling walker (2 wheels) Toilet Transfer Details (indicate cue type and reason): standing for urinal use Toileting- Clothing Manipulation and Hygiene: Minimal assistance;Sit to/from stand Toileting - Clothing Manipulation Details (indicate cue type and reason): able to utilize BUE to manage underwear, holding his own urinal, A for initial boost  General ADL Comments: focused on visual strategies and HEP for RUE    Extremity/Trunk Assessment Upper Extremity Assessment Upper Extremity Assessment: RUE deficits/detail RUE Deficits /  Details: improving, continued ataxia - especially when reaching away from BOS. RUE Coordination: decreased fine motor;decreased gross motor            Vision   Vision Assessment?: Yes;Vision impaired- to be further tested in functional context Additional Comments: Pt given strategies for reading as he was attempting it on his own. provided with blue line cue where he can use it one of 2 ways: horizontally to follow along (harder on newspaper due to short columns) or vertically - to find the blue line when he starts a new row.   Perception     Praxis      Cognition Arousal/Alertness: Awake/alert Behavior During Therapy: Flat affect Overall Cognitive Status: Impaired/Different from baseline Area of Impairment: Following commands, Problem solving, Awareness, Safety/judgement                       Following Commands: Follows one step commands consistently Safety/Judgement: Decreased awareness of deficits, Decreased awareness of safety Awareness: Emergent Problem Solving: Slow processing, Difficulty sequencing General Comments: Pt following most single step commands, required hands on assist for problem solving exercises, wife reports improved cognition        Exercises Exercises: Hand activities, Other exercises, Hand exercises Hand Exercises Digit Composite Flexion: AROM, Right, 10 reps, Seated Composite Extension: AROM, Right, 10 reps, Seated Composite Extension Limitations: also provided rubber band for resistance Digit Composite Abduction: AROM, Right, Seated Digit Composite Adduction: AROM, Right, Seated Digit Lifts: AROM, Right, Seated Digit Lifts Limitations: unable to get pinky off the table individually  - required assist Opposition: AROM, Right, Seated Hand Activities Paper Clips on Rim of Cup: Right, Seated Paper Clips on Rim of Cup Limitations: not paper clips - clothes pins Other Exercises Other Exercises: Provided theraputty handout and yellow  theraputty - reviewed in full Other Exercises: HEP for fine motor handout    Shoulder Instructions       General Comments DOE 2/4 with HEP and standing for urination    Pertinent Vitals/ Pain       Pain Assessment Pain Assessment: Faces Faces Pain Scale: Hurts whole lot Pain Location: L shoulder Pain Descriptors / Indicators: Discomfort, Grimacing, Guarding Pain Intervention(s): Monitored during session, Repositioned (scapula still moving)  Home Living                                          Prior Functioning/Environment              Frequency  Min 2X/week        Progress Toward Goals  OT Goals(current goals can now be found in the care plan section)  Progress towards OT goals: Progressing toward goals  Acute Rehab OT Goals Patient Stated Goal: go to inpatient rehab OT Goal Formulation: With patient/family Time For Goal Achievement: 06/29/22 Potential to Achieve Goals: Good  Plan Discharge plan remains appropriate    Co-evaluation                 AM-PAC OT "6 Clicks" Daily Activity     Outcome Measure   Help from another person eating meals?: A Little Help from another person taking care of personal grooming?: A Little Help from another person toileting, which includes  using toliet, bedpan, or urinal?: A Lot Help from another person bathing (including washing, rinsing, drying)?: A Lot Help from another person to put on and taking off regular upper body clothing?: A Lot Help from another person to put on and taking off regular lower body clothing?: A Lot 6 Click Score: 14    End of Session Equipment Utilized During Treatment: Gait belt  OT Visit Diagnosis: Unsteadiness on feet (R26.81);Other abnormalities of gait and mobility (R26.89);Muscle weakness (generalized) (M62.81);Ataxia, unspecified (R27.0);Low vision, both eyes (H54.2);Cognitive communication deficit (R41.841);Hemiplegia and hemiparesis Symptoms and signs involving  cognitive functions: Cerebral infarction Hemiplegia - Right/Left: Right Hemiplegia - dominant/non-dominant: Dominant Hemiplegia - caused by: Cerebral infarction   Activity Tolerance Patient tolerated treatment well   Patient Left in chair;with family/visitor present   Nurse Communication Mobility status        Time: 1116-1150 OT Time Calculation (min): 34 min  Charges: OT General Charges $OT Visit: 1 Visit OT Treatments $Self Care/Home Management : 8-22 mins $Therapeutic Exercise: 8-22 mins  Jesse Sans OTR/L Acute Rehabilitation Services Office: [place] 06/17/2022, 2:17 PM

## 2022-06-17 NOTE — Progress Notes (Signed)
Patient stood and voided in urinal 600ML, urine is bloody and small clots noted.  Hancel Ion, Tivis Ringer, RN

## 2022-06-18 ENCOUNTER — Inpatient Hospital Stay (HOSPITAL_COMMUNITY): Payer: Medicare PPO

## 2022-06-18 DIAGNOSIS — R310 Gross hematuria: Secondary | ICD-10-CM | POA: Diagnosis not present

## 2022-06-18 DIAGNOSIS — I480 Paroxysmal atrial fibrillation: Secondary | ICD-10-CM | POA: Diagnosis not present

## 2022-06-18 DIAGNOSIS — R509 Fever, unspecified: Secondary | ICD-10-CM | POA: Diagnosis not present

## 2022-06-18 LAB — TYPE AND SCREEN
ABO/RH(D): A POS
Antibody Screen: NEGATIVE
Unit division: 0

## 2022-06-18 LAB — BPAM RBC
Blood Product Expiration Date: 202312182359
ISSUE DATE / TIME: 202312050022
Unit Type and Rh: 6200

## 2022-06-18 LAB — CBC
HCT: 25.4 % — ABNORMAL LOW (ref 39.0–52.0)
Hemoglobin: 9 g/dL — ABNORMAL LOW (ref 13.0–17.0)
MCH: 28.8 pg (ref 26.0–34.0)
MCHC: 35.4 g/dL (ref 30.0–36.0)
MCV: 81.2 fL (ref 80.0–100.0)
Platelets: 31 10*3/uL — ABNORMAL LOW (ref 150–400)
RBC: 3.13 MIL/uL — ABNORMAL LOW (ref 4.22–5.81)
RDW: 17.2 % — ABNORMAL HIGH (ref 11.5–15.5)
WBC: 4 10*3/uL (ref 4.0–10.5)
nRBC: 0 % (ref 0.0–0.2)

## 2022-06-18 LAB — CULTURE, BLOOD (ROUTINE X 2)
Culture: NO GROWTH
Culture: NO GROWTH
Special Requests: ADEQUATE

## 2022-06-18 LAB — BASIC METABOLIC PANEL
Anion gap: 16 — ABNORMAL HIGH (ref 5–15)
BUN: 27 mg/dL — ABNORMAL HIGH (ref 8–23)
CO2: 18 mmol/L — ABNORMAL LOW (ref 22–32)
Calcium: 8 mg/dL — ABNORMAL LOW (ref 8.9–10.3)
Chloride: 97 mmol/L — ABNORMAL LOW (ref 98–111)
Creatinine, Ser: 1.1 mg/dL (ref 0.61–1.24)
GFR, Estimated: >60 mL/min (ref >60)
Glucose, Bld: 106 mg/dL — ABNORMAL HIGH (ref 70–99)
Potassium: 4.4 mmol/L (ref 3.5–5.1)
Sodium: 131 mmol/L — ABNORMAL LOW (ref 135–145)

## 2022-06-18 MED ORDER — SODIUM CHLORIDE 0.9% IV SOLUTION: Freq: Once | INTRAVENOUS | Status: AC

## 2022-06-18 NOTE — Progress Notes (Addendum)
Rounding Note    Patient Name: Devin Mercado Date of Encounter: 06/18/2022  [place] Cardiologist: Devin Peru, MD   Subjective   Remains weak, tired in general  Inpatient Medications    Scheduled Meds:  sodium chloride   Intravenous Once   amiodarone  200 mg Oral BID   atorvastatin  20 mg Oral Daily   famotidine  20 mg Oral Daily   lidocaine  1 patch Transdermal Q24H   predniSONE  10 mg Oral q morning   sodium bicarbonate  650 mg Oral TID   tamsulosin  0.4 mg Oral QPC supper   Continuous Infusions:  sodium chloride Stopped (06/16/22 1409)   PRN Meds: acetaminophen **OR** acetaminophen, LORazepam, morphine injection, oxyCODONE, senna-docusate, traMADol   Vital Signs    Vitals:   06/18/22 0210 06/18/22 0317 06/18/22 0405 06/18/22 0804  BP: 122/69 (!) 147/75 121/75 132/72  Pulse: 86 86 86 90  Resp: 20 (!) '21 20 18  '$ Temp: 99.4 F (37.4 C) 98.3 F (36.8 C) 98.4 F (36.9 C) 99.3 F (37.4 C)  TempSrc: Oral Oral Oral Oral  SpO2: 99% 100% 98% 100%  Weight:      Height:        Intake/Output Summary (Last 24 hours) at 06/18/2022 0930 Last data filed at 06/18/2022 0600 Gross per 24 hour  Intake 650 ml  Output 650 ml  Net 0 ml      05/18/2022   11:59 PM 05/26/2022    5:16 PM 05/19/2022    9:50 AM  Last 3 Weights  Weight (lbs) 227 lb 1.2 oz 225 lb 223 lb 9.6 oz  Weight (kg) 103 kg 102.059 kg 101.424 kg      Telemetry    SR/V paced, on NSVT 10 beats- Personally Reviewed  ECG    No new EKGs - Personally Reviewed  Physical Exam   Exam is unchanged GEN: No acute distress.   Neck: No JVD Cardiac: RRR, no murmurs, rubs, or gallops.  Respiratory: CTA b/l GI: Soft, nontender, non-distended  MS: No edema; No deformity. Psych: Normal affect   Labs    High Sensitivity Troponin:  No results for input(s): "TROPONINIHS" in the last 720 hours.   Chemistry Recent Labs  Lab 06/11/2022 1720 06/13/22 1901 06/14/22 0254 06/16/22 0715  06/17/22 0654 06/18/22 0330  NA 127* 125*   < > 129* 134* 131*  K 4.4 4.3   < > 3.2* 4.1 4.4  CL 96* 98   < > 100 107 97*  CO2 20* 18*   < > 17* 18* 18*  GLUCOSE 94 142*   < > 114* 102* 106*  BUN 36* 30*   < > 32* 32* 27*  CREATININE 1.24 1.17   < > 1.18 1.11 1.10  CALCIUM 7.7* 6.9*   < > 6.6* 7.1* 8.0*  MG  --  2.1  --   --   --   --   PROT 6.4*  --   --   --   --   --   ALBUMIN 3.6  --   --   --   --   --   AST 18  --   --   --   --   --   ALT 11  --   --   --   --   --   ALKPHOS 70  --   --   --   --   --   BILITOT 0.6  --   --   --   --   --  GFRNONAA >60 >60   < > >60 >60 >60  ANIONGAP 11 9   < > 12 9 16*   < > = values in this interval not displayed.    Lipids No results for input(s): "CHOL", "TRIG", "HDL", "LABVLDL", "LDLCALC", "CHOLHDL" in the last 168 hours.  Hematology Recent Labs  Lab 06/16/22 2309 06/17/22 0654 06/18/22 0330  WBC 3.8* 3.5* 4.0  RBC 2.48* 2.86* 3.13*  HGB 6.9* 8.3* 9.0*  HCT 20.4* 23.1* 25.4*  MCV 82.3 80.8 81.2  MCH 27.8 29.0 28.8  MCHC 33.8 35.9 35.4  RDW 17.2* 17.0* 17.2*  PLT 53* 44* 31*   Thyroid  Recent Labs  Lab 06/16/22 0748  TSH 19.164*  FREET4 0.69    BNPNo results for input(s): "BNP", "PROBNP" in the last 168 hours.  DDimer No results for input(s): "DDIMER" in the last 168 hours.   Radiology      Cardiac Studies   06/15/22: TTE 1. Difficult acoustic windows limit study Would recomm limited echo with  Definity to confirm LVEF and wall motion. There appears to be hypokinesis  of the mid/distal lateral, mid/distal septal, distal inferior walls and  apical akinesis.. Left ventricular   ejection fraction, by estimation, is 30%. The left ventricle has  moderately decreased function.   2. Right ventricular systolic function is normal. The right ventricular  size is normal. There is normal pulmonary artery systolic pressure.   3. The mitral valve is normal in structure. Trivial mitral valve  regurgitation.   4. The  aortic valve is normal in structure. Aortic valve regurgitation is  not visualized.   5. There is mild dilatation of the ascending aorta, measuring 42 mm.   6. The inferior vena cava is normal in size with greater than 50%  respiratory variability, suggesting right atrial pressure of 3 mmHg.   Patient Profile     71 y.o. male w/PMHx of HTN, CHB w/PPM, prostate cancer with mets (bone) admitted with stroke, interrogation of his PPM noted new onset Afib  Ongoing struggles with hematuria/hemorrhagic cystitis  Assessment & Plan    New paroxysmal AFib CHA2DS2Vasc is 5, unable to anticoagulate with ongoing hematuria and thrombocytopenia  Dr Devin Mercado discussed at length yesterday with patient and his wife at bedside Devin Mercado start amiodarone '200mg'$  BID in effort to suppress AF Elevated TSH noted with normal Free T4, ?sick euthyroid > treat as per IM team   2. New CM Unknown etiology, + WMA Compensated No ischemic w/u at this time though can revisit out patient Look towards adding BB/Entresto as his clinical course progresses, BP allows, and when neurology cleared for BP normalization  One NSVT noted Add BB (Toprol) when able to  Continue amiodarone   3. Recurrent fever for weeks/months ID on board Not felt to be infectious BC are neg (5 days) x2 Had fever 102 last night  We still feel TEE is indicated given PPM and immunocompromised state (chemo) Pt/wife both on board and agreeable after d/w Dr. Lovena Mercado yesterday NPO after MN Orders are in  4. Hematuria/hemorrhagic cystitis prostate cancer with bone mets, followed by oncology at [facility], anemia associated with bone marrow infiltration, pancytopenia from bone marrow suppression from Pluvicto  5. Anemia 6. Pancytopenia C/w IM and urology teams    For questions or updates, please contact Devin Mercado Please consult [email or web address] for contact info under     Signed, Devin Jamaica, PA-C  06/18/2022, 9:30 AM    I have seen  and examined this  patient with Devin Mercado.  Agree with above, note added to reflect my findings.  Remains tired and fatigued, otherwise no new complaints  GEN: Well nourished, well developed, in no acute distress  HEENT: normal  Neck: no JVD, carotid bruits, or masses Cardiac: RRR; no murmurs, rubs, or gallops,no edema  Respiratory:  clear to auscultation bilaterally, normal work of breathing GI: soft, nontender, nondistended, + BS MS: no deformity or atrophy  Skin: warm and dry Neuro:  Strength and sensation are intact Psych: euthymic mood, full affect   Paroxysmal atrial fibrillation: CHA2DS2-VASc of 5.  No plans for anticoagulation due to ongoing hematuria and thrombocytopenia.  Currently on amiodarone 200 mg twice daily.  Plan to continue amiodarone to keep him in sinus rhythm. New cardiomyopathy: Unknown cause.  Well compensated.  Devin Mercado potentially need an ischemic work-up as an outpatient Recurrent fever: Infectious disease on board.  Plan for TEE tomorrow.  Devin Mercado M. Carrye Goller MD 06/18/2022 11:10 AM

## 2022-06-18 NOTE — Progress Notes (Signed)
PT Cancellation Note  Patient Details Name: Devin Mercado MRN: [id number] DOB: Mar 17, 1951   Cancelled Treatment:    Reason Eval/Treat Not Completed: (P) Other (comment) (opt recieving platelets, RN adivising to hold) Will check back tomorrow to continue PT POC.   Audry Riles. PTA Acute Rehabilitation Services Office: [place] 06/18/2022, 2:01 PM

## 2022-06-18 NOTE — Progress Notes (Signed)
PROGRESS NOTE    Devin Mercado  QQV:[id number] DOB: December 30, 1950 DOA: 06/09/2022 PCP: [name], MD    Brief Narrative:   Devin Mercado is a 71 y.o. male with past medical history significant for prostate cancer with bone metastasis followed by oncology at [facility], anemia associate with bone marrow infiltration, pancytopenia 2/2 bone marrow suppression from Pluvicto who presented to [facility] ED on 11/30 with hematuria.  Patient was seen at [facility] urology earlier in the day and underwent bladder irrigation and subsequently when returned home started passing clots and unable to fully empty his bladder with increased pain and distention.  Patient was admitted to the hospital service at [facility] and urology was consulted and patient had Foley catheter placed with continuous bladder irrigation.  06/14/2022 at 9 AM patient developed right upper extremity ataxia and neurology was consulted given findings of CT head concerning for embolic CVA.  Unable to obtain MRI due to pacemaker with pacemaker interrogation notable for A-fib.  Unfortunate not a candidate for anticoagulation given his hematuria, acute blood loss anemia and thrombocytopenia.  Patient was transferred to [facility] for further neurology evaluation.  Assessment & Plan:   Acute CVA, suspicious for embolic Patient developed acute right upper extremity weakness on 12/2, CT with focal hypoattenuation posterior right parietal lobe measuring up to 2.5 cm concerning for acute versus subacute infarct, focal cortical hypoattenuation posterior left occipital lobe also concerning for infarct, suspect cardioembolic source from new onset A-fib.  CTA head/neck with no emergent LVO.  Unable to obtain MRI due to noncompatible PPM.  TTE with LVEF 30% with apical hypokinesis and diffuse hypokinesis.  LDL 98, hemoglobin A1c 5.6.  Patient is not a good long-term anticoagulation candidate due to his symptomatic anemia and  thrombocytopenia; but may be eligible for a Watchman device outpatient.  Neurology now signed off; recommend starting aspirin 81 mg p.o. daily once platelet count stable above 50 K. -- Neurology following, appreciate assistance -- TEE: Pending --Atorvastatin 20 g p.o. daily -- Continue monitor on telemetry  Acute urinary retention with hematuria, hemorrhagic cystitis Patient presenting to the ED following a recurrence of blood clots in his urine with recurrent urinary retention.  Underwent Foley catheter placement and continuous bladder irrigation per urology; now catheter has been discontinued -- Urology following, appreciate assistance -- Tamsulosin 0.4 mg p.o. daily -- Continue to monitor CBC daily, monitoring of strict I's and O's, urine output, bladder scan as needed -- If needs repeated in and out catheterizations, urology to consider hematuria catheter placement  Acute symptomatic blood loss anemia Pancytopenia Patient was transfused 3 unit PRBCs and 4 units of platelets during hospitalization.  Patient was previously on Pluvicto; but was stopped due to severe anemia/thrombocytopenia. --Hgb 7.2>>6.9>8.3>9.0 --Plt 42>>25>53>44>31 --Transfuse 2 units platelets today for planned TEE tomorrow -- CBC in a.m.  Fever of unclear origin Patient continues with intermittent fevers, Tmax 102.2 overnight.  Unclear etiology.  Chest x-ray without acute cardiopulmonary disease process.  Urinalysis unrevealing.  GI panel negative. -- Infectious disease following, appreciate assistance -- Blood Cultures 12/1: no growth x 5 days -- TEE planned for 12/7  Paroxysmal atrial fibrillation Seen by cardiology, started on amiodarone.  CHA2DS2-VASc score = 5, unable to anticoagulate due to ongoing hematuria and thrombocytopenia; but may be a candidate for a Watchman device in the future. -- Continue monitor on telemetry  Acute systolic congestive heart failure, new diagnosis; decided TTE with LVEF 30%,  regional wall motion normalities. -- Cardiology  following, appreciate assistance; plan outpatient ischemic workup possibly -- Cardiology to consider GDMT on discharge  Metastatic prostate cancer to bone Follows with oncology at [facility], Dr. Dorna Bloom.  Completed 5 cycles of Pluvicto, but was stopped due to severe anemia/thrombocytopenia.  Next plan to start cabazitaxel.  Continue outpatient follow-up with oncology.  Essential hypertension On valsartan 160 mg p.o. daily outpatient, currently on hold.  Blood pressure 121/75 this morning. -- Continue monitor BP closely  Hyperlipidemia -- Atorvastatin 10 mg p.o. daily  Complete heart block s/p PPM Entire patient has a PPM noted new onset A-fib.  CHADS2 Vascor = 5, unable to anticoagulate with ongoing hematuria and thrombocytopenia.  Euthyroid sick syndrome Elevated TSH but normal T4.  Recommend repeat TSH outpatient 4-6 weeks.  Hyponatremia Continue to encourage increased oral intake, repeat BMP in a.m.  Hypocalcemia Repleted during hospitalization  Hypokalemia Repleted, potassium 4.4 this morning.  Weakness/debility/deconditioning: --Continue PT/OT efforts while inpatient -- Plan CIR on discharge   DVT prophylaxis: SCDs Start: 06/13/22 0135    Code Status: Full Code Family Communication: No family present at bedside this morning  Disposition Plan:  Level of care: Telemetry Medical Status is: Inpatient Remains inpatient appropriate because: Pending TEE tomorrow, awaiting specialist sign off then plan CIR on discharge    Consultants:  Cardiology/electrophysiology Neurology Urology Infectious disease  Procedures:  TTE:  TEE: Pending  Antimicrobials:  Ciprofloxacin 10/2 - 10/2   Subjective: Patient seen examined bedside, resting comfortably.  Sitting in bedside chair.  States "mouth is dry" and requesting water.  RN present.  Discuss further platelet transfusion today as platelets are 31 and likely will need to be  greater than 50 for TEE planned for tomorrow.  No other specific complaints or concerns at this time.  Discussed with patient pending transesophageal echocardiogram tomorrow to ensure no vegetations on his pacemaker leads given his recurrent fevers of unclear origin.  Denies headache, no dizziness, no chills/night sweats, no nausea/vomiting/diarrhea, no chest pain, no palpitations, no dizziness, no abdominal pain, no paresthesias.  No acute events overnight per nursing staff.  Objective: Vitals:   06/18/22 0804 06/18/22 1138 06/18/22 1158 06/18/22 1233  BP: 132/72 135/82 135/75 132/80  Pulse: 90 96 90 86  Resp: '18 18 19 18  '$ Temp: 99.3 F (37.4 C) 99.8 F (37.7 C) 99 F (37.2 C) 98.8 F (37.1 C)  TempSrc: Oral Oral Oral Oral  SpO2: 100% 100%  99%  Weight:      Height:        Intake/Output Summary (Last 24 hours) at 06/18/2022 1321 Last data filed at 06/18/2022 0600 Gross per 24 hour  Intake 300 ml  Output 650 ml  Net -350 ml   Filed Weights   05/29/2022 1716 06/10/2022 2359  Weight: 102.1 kg 103 kg    Examination:  Physical Exam: GEN: NAD, alert and oriented x 3, wd/wn HEENT: NCAT, PERRL, EOMI, sclera clear, MMM PULM: CTAB w/o wheezes/crackles, normal respiratory effort, on room air CV: RRR w/o M/G/R GI: abd soft, NTND, NABS, no R/G/M MSK: no peripheral edema, moves all extremities independently NEURO: CN II-XII intact, sensation to light touch intact PSYCH: normal mood/affect Integumentary: dry/intact, no rashes or wounds    Data Reviewed: I have personally reviewed following labs and imaging studies  CBC: Recent Labs  Lab 06/13/22 1901 06/14/22 0709 06/14/22 0844 06/14/22 2302 06/15/22 0814 06/16/22 0715 06/16/22 2309 06/17/22 0654 06/18/22 0330  WBC 3.6*  --  2.4*   < > 3.9* 3.7* 3.8* 3.5* 4.0  NEUTROABS 1.9  --  1.4*  --   --   --   --   --   --   HGB 8.1*   < > 7.3*   < > 7.7* 7.6* 6.9* 8.3* 9.0*  HCT 24.6*   < > 22.5*   < > 23.0* 21.5* 20.4* 23.1* 25.4*   MCV 85.4  --  86.2   < > 82.4 80.2 82.3 80.8 81.2  PLT 80*  --  43*   < > 33* 25* 53* 44* 31*   < > = values in this interval not displayed.   Basic Metabolic Panel: Recent Labs  Lab 06/13/22 1901 06/14/22 0254 06/15/22 0814 06/16/22 0715 06/17/22 0654 06/18/22 0330  NA 125* 132* 130* 129* 134* 131*  K 4.3 4.0 3.5 3.2* 4.1 4.4  CL 98 103 102 100 107 97*  CO2 18* 20* 18* 17* 18* 18*  GLUCOSE 142* 110* 109* 114* 102* 106*  BUN 30* 28* 28* 32* 32* 27*  CREATININE 1.17 1.12 1.43* 1.18 1.11 1.10  CALCIUM 6.9* 6.9* 6.4* 6.6* 7.1* 8.0*  MG 2.1  --   --   --   --   --    GFR: Estimated Creatinine Clearance: 74.3 mL/min (by C-G formula based on SCr of 1.1 mg/dL). Liver Function Tests: Recent Labs  Lab 06/02/2022 1720  AST 18  ALT 11  ALKPHOS 70  BILITOT 0.6  PROT 6.4*  ALBUMIN 3.6   Recent Labs  Lab 05/14/2022 1720  LIPASE 10*   No results for input(s): "AMMONIA" in the last 168 hours. Coagulation Profile: Recent Labs  Lab 06/13/22 1901  INR 1.7*   Cardiac Enzymes: No results for input(s): "CKTOTAL", "CKMB", "CKMBINDEX", "TROPONINI" in the last 168 hours. BNP (last 3 results) No results for input(s): "PROBNP" in the last 8760 hours. HbA1C: No results for input(s): "HGBA1C" in the last 72 hours. CBG: Recent Labs  Lab 06/14/22 1056 06/14/22 1537  GLUCAP 121* 172*   Lipid Profile: No results for input(s): "CHOL", "HDL", "LDLCALC", "TRIG", "CHOLHDL", "LDLDIRECT" in the last 72 hours. Thyroid Function Tests: Recent Labs    06/16/22 0748  TSH 19.164*  FREET4 0.69   Anemia Panel: No results for input(s): "VITAMINB12", "FOLATE", "FERRITIN", "TIBC", "IRON", "RETICCTPCT" in the last 72 hours. Sepsis Labs: Recent Labs  Lab 06/14/22 0254 06/15/22 0814 06/16/22 0715 06/17/22 0654  PROCALCITON  --  14.08 7.37 4.00  LATICACIDVEN 1.0  --   --   --     Recent Results (from the past 240 hour(s))  Culture, blood (Routine X 2) w Reflex to ID Panel     Status: None    Collection Time: 06/13/22  7:01 PM   Specimen: BLOOD  Result Value Ref Range Status   Specimen Description   Final    BLOOD LEFT ANTECUBITAL Performed at [facility], [place] [address], [place], [place] [address]    Special Requests   Final    BOTTLES DRAWN AEROBIC ONLY Blood Culture adequate volume Performed at [facility] [address], [place],  [address]    Culture   Final    NO GROWTH 5 DAYS Performed at [facility], [place] [address], [place],  [address]    Report Status 06/18/2022 FINAL  Final  Culture, blood (Routine X 2) w Reflex to ID Panel     Status: None   Collection Time: 06/13/22  7:04 PM   Specimen: BLOOD  Result Value Ref Range  Status   Specimen Description   Final    BLOOD BLOOD RIGHT HAND Performed at [facility] [address], [place], [place] [address]    Special Requests   Final    BOTTLES DRAWN AEROBIC ONLY Blood Culture results may not be optimal due to an inadequate volume of blood received in culture bottles Performed at [facility] [address], [place], [place] [address]    Culture   Final    NO GROWTH 5 DAYS Performed at [facility], [place] [address], [place], [place] [address]    Report Status 06/18/2022 FINAL  Final  Gastrointestinal Panel by PCR , Stool     Status: None   Collection Time: 06/16/22  4:01 AM   Specimen: Stool  Result Value Ref Range Status   Campylobacter species NOT DETECTED NOT DETECTED Final   Plesimonas shigelloides NOT DETECTED NOT DETECTED Final   Salmonella species NOT DETECTED NOT DETECTED Final   Yersinia enterocolitica NOT DETECTED NOT DETECTED Final   Vibrio species NOT DETECTED NOT DETECTED Final   Vibrio cholerae NOT DETECTED NOT DETECTED Final   Enteroaggregative E coli (EAEC) NOT DETECTED NOT DETECTED Final   Enteropathogenic E coli (EPEC) NOT DETECTED NOT DETECTED Final   Enterotoxigenic E coli (ETEC)  NOT DETECTED NOT DETECTED Final   Shiga like toxin producing E coli (STEC) NOT DETECTED NOT DETECTED Final   Shigella/Enteroinvasive E coli (EIEC) NOT DETECTED NOT DETECTED Final   Cryptosporidium NOT DETECTED NOT DETECTED Final   Cyclospora cayetanensis NOT DETECTED NOT DETECTED Final   Entamoeba histolytica NOT DETECTED NOT DETECTED Final   Giardia lamblia NOT DETECTED NOT DETECTED Final   Adenovirus F40/41 NOT DETECTED NOT DETECTED Final   Astrovirus NOT DETECTED NOT DETECTED Final   Norovirus GI/GII NOT DETECTED NOT DETECTED Final   Rotavirus A NOT DETECTED NOT DETECTED Final   Sapovirus (I, II, IV, and V) NOT DETECTED NOT DETECTED Final    Comment: Performed at [facility], [address], [place],  [address]         Radiology Studies: DG Chest Port 1 View  Result Date: 06/18/2022 CLINICAL DATA:  Fever, urinary retention EXAM: PORTABLE CHEST 1 VIEW COMPARISON:  06/13/2022 FINDINGS: Left pacer unchanged. Mild cardiomegaly. No confluent opacities, effusions or edema. No acute bony abnormality. IMPRESSION: Mild cardiomegaly.  No active disease. Electronically Signed   By: Rolm Baptise M.D.   On: 06/18/2022 01:08        Scheduled Meds:  sodium chloride   Intravenous Once   amiodarone  200 mg Oral BID   atorvastatin  20 mg Oral Daily   famotidine  20 mg Oral Daily   lidocaine  1 patch Transdermal Q24H   predniSONE  10 mg Oral q morning   sodium bicarbonate  650 mg Oral TID   tamsulosin  0.4 mg Oral QPC supper   Continuous Infusions:  sodium chloride Stopped (06/16/22 1409)     LOS: 6 days    Time spent: 45 minutes spent on chart review, discussion with nursing staff, consultants, updating family and interview/physical exam; more than 50% of that time was spent in counseling and/or coordination of care.    Kayela Humphres J [place] DO [facility] Available via Epic secure chat 7am-7pm After these hours, please refer to coverage provider listed on  [email or web address] 06/18/2022, 1:21 PM

## 2022-06-18 NOTE — Progress Notes (Signed)
Inpatient Rehab Admissions Coordinator:    I have insurance auth for CIR but await medical readiness. I will follow for potential admit later this week.  Clemens Catholic, [place], [place] Admissions Coordinator  [phone] ([place]) [phone] (office)

## 2022-06-18 NOTE — Progress Notes (Signed)
Patient ID: Devin Mercado, male   DOB: 25-Nov-1950, 71 y.o.   MRN: [id number]    Subjective: No changes overnight.  Still voiding darker red urine but emptying.  No episode of retention or need for intermittent catheterization.  Objective: Vital signs in last 24 hours: Temp:  [98.3 F (36.8 C)-102.2 F (39 C)] 98.4 F (36.9 C) (12/06 0405) Pulse Rate:  [76-93] 86 (12/06 0405) Resp:  [14-27] 20 (12/06 0405) BP: (121-157)/(69-97) 121/75 (12/06 0405) SpO2:  [96 %-100 %] 98 % (12/06 0405)  Intake/Output from previous day: 12/05 0701 - 12/06 0700 In: 970 [P.O.:970] Out: 1250 [Urine:1250] Intake/Output this shift: No intake/output data recorded.  Physical Exam:  General: Alert and oriented GU: Voided urines look like red wine, no clots noted  Lab Results: Recent Labs    06/16/22 2309 06/17/22 0654 06/18/22 0330  HGB 6.9* 8.3* 9.0*  HCT 20.4* 23.1* 25.4*   BMET Recent Labs    06/17/22 0654 06/18/22 0330  NA 134* 131*  K 4.1 4.4  CL 107 97*  CO2 18* 18*  GLUCOSE 102* 106*  BUN 32* 27*  CREATININE 1.11 1.10  CALCIUM 7.1* 8.0*     Studies/Results: DG Chest Port 1 View  Result Date: 06/18/2022 CLINICAL DATA:  Fever, urinary retention EXAM: PORTABLE CHEST 1 VIEW COMPARISON:  06/13/2022 FINDINGS: Left pacer unchanged. Mild cardiomegaly. No confluent opacities, effusions or edema. No acute bony abnormality. IMPRESSION: Mild cardiomegaly.  No active disease. Electronically Signed   By: Rolm Baptise M.D.   On: 06/18/2022 01:08    Assessment/Plan: 1) Castrate resistant metastatic prostate cancer: On ADT. Chemotherapy held per Oncology at [facility]. 2) Radiation cystitis/hematuria: He is voiding and Hgb is stable this morning.  No absolute indication for catheter placement.  He prefers to avoid a catheter as well.  Continue to in and out catheterize prn if retention develops.  If unsuccessful or if requiring repeated in and out catheterization, will consider hematuria catheter  placement.  Plan to start hyperbaric oxygen therapy 12/19.  Continue to monitor serial Hgb.   LOS: 6 days   [name] 06/18/2022, 7:06 AM

## 2022-06-18 NOTE — Progress Notes (Signed)
[facility] night cross cover note:   I was notified by RN that this patient is febrile, with current temperature 102.2.  Initial vitals at this time noted to be blood pressure 136/71; respiratory rate 27; heart rate 87, oxygen saturation 98% on room air.  RN conveys that the patient is without acute complaint at this time.  Is receiving a dose of prn acetaminophen, and ice packs are being placed to help dissipate fever.  Per my chart review, this 71 year old male with metastatic prostate cancer undergoing chemotherapy, who is here with acute ischemic CVA in setting of new diagnosis of atrial fibrillation, hemorrhagic cystitis.  Per chart review, he has also spiked intermittent fevers of unclear etiology during his hospitalization.  Potential contribution from underlying metastatic disease.  The current temperature of 102.2 appears to represent a Tmax of at least the last 48 hours, most recent prior elevated temperature of 100.4 at 2 AM on 06/15/2022.  He has noted to have chest x-ray on 06/13/2022, which showed no evidence of acute process.  Will check updated chest x-ray at this time.    Babs Bertin, DO Hospitalist

## 2022-06-18 NOTE — Progress Notes (Signed)
[facility] for Infectious Disease   Reason for visit: Follow up on fever  Interval History: fever to 102.2 overnight; no new symptoms.    TEE planned for tomorrow  Physical Exam: Constitutional:  Vitals:   06/18/22 1519 06/18/22 1606  BP: (!) 111/45 (!) 153/83  Pulse: 84 99  Resp: 17 20  Temp: 98.2 F (36.8 C) 100.2 F (37.9 C)  SpO2: 100% 100%   patient appears in NAD Respiratory: Normal respiratory effort  Review of Systems: Constitutional: negative for chills  Lab Results  Component Value Date   WBC 4.0 06/18/2022   HGB 9.0 (L) 06/18/2022   HCT 25.4 (L) 06/18/2022   MCV 81.2 06/18/2022   PLT 31 (L) 06/18/2022    Lab Results  Component Value Date   CREATININE 1.10 06/18/2022   BUN 27 (H) 06/18/2022   NA 131 (L) 06/18/2022   K 4.4 06/18/2022   CL 97 (L) 06/18/2022   CO2 18 (L) 06/18/2022    Lab Results  Component Value Date   ALT 11 05/16/2022   AST 18 05/25/2022   ALKPHOS 70 06/03/2022     Microbiology: Recent Results (from the past 240 hour(s))  Culture, blood (Routine X 2) w Reflex to ID Panel     Status: None   Collection Time: 06/13/22  7:01 PM   Specimen: BLOOD  Result Value Ref Range Status   Specimen Description   Final    BLOOD LEFT ANTECUBITAL Performed at [facility], [place] [address] [place], [place] [address]    Special Requests   Final    BOTTLES DRAWN AEROBIC ONLY Blood Culture adequate volume Performed at [facility] [address], [place], [place] [address]    Culture   Final    NO GROWTH 5 DAYS Performed at [facility], [place] [address] [place], [place] [address]    Report Status 06/18/2022 FINAL  Final  Culture, blood (Routine X 2) w Reflex to ID Panel     Status: None   Collection Time: 06/13/22  7:04 PM   Specimen: BLOOD  Result Value Ref Range Status   Specimen Description   Final    BLOOD BLOOD RIGHT HAND Performed at [facility]  [address], [place], [place] [address]    Special Requests   Final    BOTTLES DRAWN AEROBIC ONLY Blood Culture results may not be optimal due to an inadequate volume of blood received in culture bottles Performed at [facility] [address], [place], [place] [address]    Culture   Final    NO GROWTH 5 DAYS Performed at [facility], [place] [address], [place], [place] [address]    Report Status 06/18/2022 FINAL  Final  Gastrointestinal Panel by PCR , Stool     Status: None   Collection Time: 06/16/22  4:01 AM   Specimen: Stool  Result Value Ref Range Status   Campylobacter species NOT DETECTED NOT DETECTED Final   Plesimonas shigelloides NOT DETECTED NOT DETECTED Final   Salmonella species NOT DETECTED NOT DETECTED Final   Yersinia enterocolitica NOT DETECTED NOT DETECTED Final   Vibrio species NOT DETECTED NOT DETECTED Final   Vibrio cholerae NOT DETECTED NOT DETECTED Final   Enteroaggregative E coli (EAEC) NOT DETECTED NOT DETECTED Final   Enteropathogenic E coli (EPEC) NOT DETECTED NOT DETECTED Final   Enterotoxigenic E coli (ETEC) NOT DETECTED NOT DETECTED Final   Shiga  like toxin producing E coli (STEC) NOT DETECTED NOT DETECTED Final   Shigella/Enteroinvasive E coli (EIEC) NOT DETECTED NOT DETECTED Final   Cryptosporidium NOT DETECTED NOT DETECTED Final   Cyclospora cayetanensis NOT DETECTED NOT DETECTED Final   Entamoeba histolytica NOT DETECTED NOT DETECTED Final   Giardia lamblia NOT DETECTED NOT DETECTED Final   Adenovirus F40/41 NOT DETECTED NOT DETECTED Final   Astrovirus NOT DETECTED NOT DETECTED Final   Norovirus GI/GII NOT DETECTED NOT DETECTED Final   Rotavirus A NOT DETECTED NOT DETECTED Final   Sapovirus (I, II, IV, and V) NOT DETECTED NOT DETECTED Final    Comment: Performed at [facility], [address], [place], [place] [address]    Impression/Plan:  1. Fever - still intermittent with a fever overnight.  No new localizing  symptoms.   Overall, seems to be non-infectious, though will see what the TEE shows. I will send of the Karius test as well to see if there are any findings from that.   Discussed with the patient and his wife.   I have personally spent 55 minutes involved in face-to-face and non-face-to-face activities for this patient on the day of the visit. Professional time spent includes the following activities: Preparing to see the patient (review of tests), Obtaining and/or reviewing separately obtained history (admission/discharge record), Performing a medically appropriate examination and/or evaluation , Ordering medications/tests/procedures, referring and communicating with other health care professionals including for the Mott test, Documenting clinical information in the EMR, Independently interpreting results (not separately reported), Communicating results to the patient/family/caregiver, Counseling and educating the patient/family/caregiver and Care coordination (not separately reported).

## 2022-06-18 NOTE — Progress Notes (Addendum)
Patient had Red MEWS due to temp 102.0 and RR 27, patient denied any other symptoms, BP 136/71(86), HR 87, 98% on RA.  Rapid Response and on call provider notified, CXR ordered. Acetaminophen given and ice packs applied in attempt to bring temp down. Increased vital signs frequency to Q1H x4. Last temp 98.4, RR 20.  Patient incontinent all shift, unable to collect urine. Patient soaked incontinent pad with small blood clots, blankets on chair, and found a puddle of bloody urine on the floor. Bladder scan showed 23 ml, patient denied full of bladder or pain in bladder. Will continue to monitor.

## 2022-06-19 ENCOUNTER — Inpatient Hospital Stay (HOSPITAL_COMMUNITY): Payer: Medicare PPO

## 2022-06-19 ENCOUNTER — Encounter (HOSPITAL_COMMUNITY): Payer: Self-pay | Admitting: Certified Registered"

## 2022-06-19 DIAGNOSIS — R509 Fever, unspecified: Secondary | ICD-10-CM | POA: Diagnosis not present

## 2022-06-19 DIAGNOSIS — C61 Malignant neoplasm of prostate: Secondary | ICD-10-CM | POA: Diagnosis not present

## 2022-06-19 DIAGNOSIS — I480 Paroxysmal atrial fibrillation: Secondary | ICD-10-CM | POA: Diagnosis not present

## 2022-06-19 DIAGNOSIS — D61818 Other pancytopenia: Secondary | ICD-10-CM | POA: Diagnosis not present

## 2022-06-19 LAB — BASIC METABOLIC PANEL
Anion gap: 10 (ref 5–15)
BUN: 25 mg/dL — ABNORMAL HIGH (ref 8–23)
CO2: 18 mmol/L — ABNORMAL LOW (ref 22–32)
Calcium: 7 mg/dL — ABNORMAL LOW (ref 8.9–10.3)
Chloride: 100 mmol/L (ref 98–111)
Creatinine, Ser: 1.14 mg/dL (ref 0.61–1.24)
GFR, Estimated: >60 mL/min (ref >60)
Glucose, Bld: 116 mg/dL — ABNORMAL HIGH (ref 70–99)
Potassium: 4.3 mmol/L (ref 3.5–5.1)
Sodium: 128 mmol/L — ABNORMAL LOW (ref 135–145)

## 2022-06-19 LAB — CBC
HCT: 20.7 % — ABNORMAL LOW (ref 39.0–52.0)
Hemoglobin: 7.2 g/dL — ABNORMAL LOW (ref 13.0–17.0)
MCH: 28.3 pg (ref 26.0–34.0)
MCHC: 34.8 g/dL (ref 30.0–36.0)
MCV: 81.5 fL (ref 80.0–100.0)
Platelets: 21 10*3/uL — CL (ref 150–400)
RBC: 2.54 MIL/uL — ABNORMAL LOW (ref 4.22–5.81)
RDW: 17 % — ABNORMAL HIGH (ref 11.5–15.5)
WBC: 3.3 10*3/uL — ABNORMAL LOW (ref 4.0–10.5)
nRBC: 0 % (ref 0.0–0.2)

## 2022-06-19 LAB — IMMATURE PLATELET FRACTION: Immature Platelet Fraction: 5.4 % (ref 1.2–8.6)

## 2022-06-19 LAB — PREPARE RBC (CROSSMATCH)

## 2022-06-19 MED ORDER — SODIUM CHLORIDE 0.9% IV SOLUTION: Freq: Once | INTRAVENOUS | Status: AC

## 2022-06-19 MED ORDER — SODIUM CHLORIDE 0.9 % IV SOLN: INTRAVENOUS | Status: DC

## 2022-06-19 MED ORDER — B COMPLEX-C PO TABS
1.0000 | ORAL_TABLET | Freq: Every day | ORAL | Status: DC
  Administered 2022-06-20 – 2022-06-22 (×3): 1 via ORAL
  Filled 2022-06-19 (×4): qty 1

## 2022-06-19 NOTE — Progress Notes (Signed)
   Notified by the lab of critically low platelet count of 21K. This has been persistently declining. TEE is invasive and carries a risk of mucosal bleeding and possible esophageal perforation which could cause significant bleeding with his degree of thrombocytopenia. Per ID, it is felt that fever is "non-infectious", however, the procedure was planned since he has a pacemaker. Would ideally want platelets to be stable and >50K. The case has been cancelled for today.  Pixie Casino, MD, [facility], [place] Director of the [facility] Lipid Disorders &  Cardiovascular Risk Reduction Clinic Diplomate of the American Board of Clinical Lipidology Attending Cardiologist  Direct Dial: [phone]  Fax: [phone]  Website:  [email or web address]

## 2022-06-19 NOTE — Progress Notes (Signed)
PROGRESS NOTE    Devin Mercado  RFF:[id number] DOB: October 20, 1950 DOA: 05/14/2022 PCP: [name], MD    Brief Narrative:   Devin Mercado is a 71 y.o. male with past medical history significant for prostate cancer with bone metastasis followed by oncology at [facility], anemia associate with bone marrow infiltration, pancytopenia 2/2 bone marrow suppression from Pluvicto who presented to [place] ED on 11/30 with hematuria.  Patient was seen at [facility] urology earlier in the day and underwent bladder irrigation and subsequently when returned home started passing clots and unable to fully empty his bladder with increased pain and distention.  Patient was admitted to the hospital service at [facility] and urology was consulted and patient had Foley catheter placed with continuous bladder irrigation.  06/14/2022 at 9 AM patient developed right upper extremity ataxia and neurology was consulted given findings of CT head concerning for embolic CVA.  Unable to obtain MRI due to pacemaker with pacemaker interrogation notable for A-fib.  Unfortunate not a candidate for anticoagulation given his hematuria, acute blood loss anemia and thrombocytopenia.  Patient was transferred to [facility] for further neurology evaluation.  Assessment & Plan:   Acute CVA, suspicious for embolic Patient developed acute right upper extremity weakness on 12/2, CT with focal hypoattenuation posterior right parietal lobe measuring up to 2.5 cm concerning for acute versus subacute infarct, focal cortical hypoattenuation posterior left occipital lobe also concerning for infarct, suspect cardioembolic source from new onset A-fib.  CTA head/neck with no emergent LVO.  Unable to obtain MRI due to noncompatible PPM.  TTE with LVEF 30% with apical hypokinesis and diffuse hypokinesis.  LDL 98, hemoglobin A1c 5.6.  Patient is not a good long-term anticoagulation candidate due to his symptomatic anemia and  thrombocytopenia; but may be eligible for a Watchman device outpatient.  Neurology now signed off; recommend starting aspirin 81 mg p.o. daily once platelet count stable above 50 K. -- Neurology following, appreciate assistance -- TEE: Pending; platlets need to be greater than 50K -- Atorvastatin 20 g p.o. daily -- Continue monitor on telemetry  Acute urinary retention with hematuria, hemorrhagic cystitis Patient presenting to the ED following a recurrence of blood clots in his urine with recurrent urinary retention.  Underwent Foley catheter placement and continuous bladder irrigation per urology; now catheter has been discontinued -- Urology following, appreciate assistance -- Tamsulosin 0.4 mg p.o. daily -- Continue to monitor CBC daily, monitoring of strict I's and O's, urine output, bladder scan as needed  Acute symptomatic blood loss anemia Pancytopenia Patient was transfused 3 unit PRBCs and 6 units of platelets during hospitalization.  Patient was previously on Pluvicto; but was stopped due to severe anemia/thrombocytopenia.  Unclear etiology for continued dropping of platelets, whether it is continued platelet consumption from unclear etiology versus continued marrow suppression from chemotherapy versus progression of underlying malignancy. -- Hgb 7.2>>6.9>8.3>9.0>7.2 -- Plt 42>>25>53>44>31 -- Transfuse 2 units platelets and pRBC today -- CBC in a.m. -- Will consult hematology for evaluation  Fever of unclear origin Patient continues with intermittent fevers, Tmax 103 overnight.  Unclear etiology.  Chest x-ray without acute cardiopulmonary disease process.  Urinalysis unrevealing.  GI panel negative. -- Infectious disease following, appreciate assistance -- Blood Cultures 12/1: no growth x 5 days -- ID ordered Karius test on 12/6: pending -- TEE pending if platelet count greater than 50 K  Paroxysmal atrial fibrillation Seen by cardiology, started on amiodarone.  CHA2DS2-VASc  score = 5, unable to anticoagulate  due to ongoing hematuria and thrombocytopenia; but may be a candidate for a Watchman device in the future. -- Amiodarone 200 mg p.o. twice daily -- EP recommended addition of Toprol 25 mg daily when able -- Continue monitor on telemetry -- Outpatient follow-up with electrophysiology  Acute systolic congestive heart failure, new diagnosis; decided TTE with LVEF 30%, regional wall motion normalities. -- Cardiology following, appreciate assistance; plan outpatient ischemic workup possibly -- Cardiology to consider GDMT on discharge  Metastatic prostate cancer to bone Follows with oncology at [facility], Dr. Lamonte [name].  Completed 5 cycles of Pluvicto, but was stopped due to severe anemia/thrombocytopenia.  Next plan to start cabazitaxel.  Continue outpatient follow-up with oncology. -- CT L-spine with contrast due to progressive low back pain: pending  Essential hypertension On valsartan 160 mg p.o. daily outpatient, currently on hold.  Blood pressure 121/75 this morning. -- Continue monitor BP closely  Hyperlipidemia -- Atorvastatin 10 mg p.o. daily  Complete heart block s/p PPM Entire patient has a PPM noted new onset A-fib.  CHADS2 Vascor = 5, unable to anticoagulate with ongoing hematuria and thrombocytopenia.  Euthyroid sick syndrome Elevated TSH but normal T4.  Recommend repeat TSH outpatient 4-6 weeks.  Hyponatremia Continue to encourage increased oral intake, repeat BMP in a.m.  Hypocalcemia Repleted during hospitalization  Hypokalemia Repleted, potassium 4.3 this morning.  Weakness/debility/deconditioning: --Continue PT/OT efforts while inpatient -- Plan CIR on discharge   DVT prophylaxis: SCDs Start: 06/13/22 0135    Code Status: Full Code Family Communication: No family present at bedside this morning  Disposition Plan:  Level of care: Telemetry Medical Status is: Inpatient Remains inpatient appropriate because: Pending TEE  tomorrow, awaiting specialist sign off then plan CIR on discharge    Consultants:  Cardiology/electrophysiology Neurology Urology Infectious disease Oncology/hematology  Procedures:  TTE:  TEE: Pending  Antimicrobials:  Ciprofloxacin 10/2 - 10/2   Subjective: Patient seen examined bedside, resting comfortably.  Sitting in bedside chair.  Spouse and daughter present.  Continue with weakness, worsening low back pain.  No other specific complaints or concerns at this time.  Continues with recurrent fevers.  Platelet count down from 31-21 today despite 2 units platelet transfusion yesterday.  Denies headache, no dizziness, no chills/night sweats, no nausea/vomiting/diarrhea, no chest pain, no palpitations, no dizziness, no abdominal pain, no paresthesias.  No acute events overnight per nursing staff.  Objective: Vitals:   06/18/22 2334 06/19/22 0337 06/19/22 0731 06/19/22 1158  BP: 123/67 122/75 119/65 111/62  Pulse: 85 (!) 105 88 (!) 105  Resp: '18 16 17 20  '$ Temp: 98.1 F (36.7 C) (!) 101.3 F (38.5 C) 99 F (37.2 C) 99.7 F (37.6 C)  TempSrc:   Oral Oral  SpO2: 98% 98% 99% 96%  Weight:      Height:        Intake/Output Summary (Last 24 hours) at 06/19/2022 1251 Last data filed at 06/19/2022 1610 Gross per 24 hour  Intake 700 ml  Output 1125 ml  Net -425 ml   Filed Weights   06/02/2022 1716 05/17/2022 2359  Weight: 102.1 kg 103 kg    Examination:  Physical Exam: GEN: NAD, alert and oriented x 3, ill in appearance HEENT: NCAT, PERRL, EOMI, sclera clear, MMM PULM: CTAB w/o wheezes/crackles, normal respiratory effort, on room air CV: RRR w/o M/G/R GI: abd soft, NTND, NABS, no R/G/M MSK: no peripheral edema, moves all extremities independently NEURO: CN II-XII intact, sensation to light touch intact PSYCH: normal mood/affect Integumentary: dry/intact, no rashes or  wounds    Data Reviewed: I have personally reviewed following labs and imaging studies  CBC: Recent  Labs  Lab 06/13/22 1901 06/14/22 0709 06/14/22 0844 06/14/22 2302 06/16/22 0715 06/16/22 2309 06/17/22 0654 06/18/22 0330 06/19/22 0555  WBC 3.6*  --  2.4*   < > 3.7* 3.8* 3.5* 4.0 3.3*  NEUTROABS 1.9  --  1.4*  --   --   --   --   --   --   HGB 8.1*   < > 7.3*   < > 7.6* 6.9* 8.3* 9.0* 7.2*  HCT 24.6*   < > 22.5*   < > 21.5* 20.4* 23.1* 25.4* 20.7*  MCV 85.4  --  86.2   < > 80.2 82.3 80.8 81.2 81.5  PLT 80*  --  43*   < > 25* 53* 44* 31* 21*   < > = values in this interval not displayed.   Basic Metabolic Panel: Recent Labs  Lab 06/13/22 1901 06/14/22 0254 06/15/22 0814 06/16/22 0715 06/17/22 0654 06/18/22 0330 06/19/22 0555  NA 125*   < > 130* 129* 134* 131* 128*  K 4.3   < > 3.5 3.2* 4.1 4.4 4.3  CL 98   < > 102 100 107 97* 100  CO2 18*   < > 18* 17* 18* 18* 18*  GLUCOSE 142*   < > 109* 114* 102* 106* 116*  BUN 30*   < > 28* 32* 32* 27* 25*  CREATININE 1.17   < > 1.43* 1.18 1.11 1.10 1.14  CALCIUM 6.9*   < > 6.4* 6.6* 7.1* 8.0* 7.0*  MG 2.1  --   --   --   --   --   --    < > = values in this interval not displayed.   GFR: Estimated Creatinine Clearance: 71.7 mL/min (by C-G formula based on SCr of 1.14 mg/dL). Liver Function Tests: Recent Labs  Lab 06/05/2022 1720  AST 18  ALT 11  ALKPHOS 70  BILITOT 0.6  PROT 6.4*  ALBUMIN 3.6   Recent Labs  Lab 05/19/2022 1720  LIPASE 10*   No results for input(s): "AMMONIA" in the last 168 hours. Coagulation Profile: Recent Labs  Lab 06/13/22 1901  INR 1.7*   Cardiac Enzymes: No results for input(s): "CKTOTAL", "CKMB", "CKMBINDEX", "TROPONINI" in the last 168 hours. BNP (last 3 results) No results for input(s): "PROBNP" in the last 8760 hours. HbA1C: No results for input(s): "HGBA1C" in the last 72 hours. CBG: Recent Labs  Lab 06/14/22 1056 06/14/22 1537  GLUCAP 121* 172*   Lipid Profile: No results for input(s): "CHOL", "HDL", "LDLCALC", "TRIG", "CHOLHDL", "LDLDIRECT" in the last 72 hours. Thyroid  Function Tests: No results for input(s): "TSH", "T4TOTAL", "FREET4", "T3FREE", "THYROIDAB" in the last 72 hours.  Anemia Panel: No results for input(s): "VITAMINB12", "FOLATE", "FERRITIN", "TIBC", "IRON", "RETICCTPCT" in the last 72 hours. Sepsis Labs: Recent Labs  Lab 06/14/22 0254 06/15/22 0814 06/16/22 0715 06/17/22 0654  PROCALCITON  --  14.08 7.37 4.00  LATICACIDVEN 1.0  --   --   --     Recent Results (from the past 240 hour(s))  Culture, blood (Routine X 2) w Reflex to ID Panel     Status: None   Collection Time: 06/13/22  7:01 PM   Specimen: BLOOD  Result Value Ref Range Status   Specimen Description   Final    BLOOD LEFT ANTECUBITAL Performed at [facility], [address] [place],  [place] [address]    Special Requests   Final    BOTTLES DRAWN AEROBIC ONLY Blood Culture adequate volume Performed at [facility] [address], [place], [place] [address]    Culture   Final    NO GROWTH 5 DAYS Performed at [facility], [place] [address], [place], [place] [address]    Report Status 06/18/2022 FINAL  Final  Culture, blood (Routine X 2) w Reflex to ID Panel     Status: None   Collection Time: 06/13/22  7:04 PM   Specimen: BLOOD  Result Value Ref Range Status   Specimen Description   Final    BLOOD BLOOD RIGHT HAND Performed at [facility] [address] [place], [place] [address]    Special Requests   Final    BOTTLES DRAWN AEROBIC ONLY Blood Culture results may not be optimal due to an inadequate volume of blood received in culture bottles Performed at [facility] [address], [place], [place] [address]    Culture   Final    NO GROWTH 5 DAYS Performed at [facility], [place] [address], [place], [place] [address]    Report Status 06/18/2022 FINAL  Final  Gastrointestinal Panel by PCR , Stool     Status: None   Collection Time: 06/16/22  4:01 AM   Specimen: Stool   Result Value Ref Range Status   Campylobacter species NOT DETECTED NOT DETECTED Final   Plesimonas shigelloides NOT DETECTED NOT DETECTED Final   Salmonella species NOT DETECTED NOT DETECTED Final   Yersinia enterocolitica NOT DETECTED NOT DETECTED Final   Vibrio species NOT DETECTED NOT DETECTED Final   Vibrio cholerae NOT DETECTED NOT DETECTED Final   Enteroaggregative E coli (EAEC) NOT DETECTED NOT DETECTED Final   Enteropathogenic E coli (EPEC) NOT DETECTED NOT DETECTED Final   Enterotoxigenic E coli (ETEC) NOT DETECTED NOT DETECTED Final   Shiga like toxin producing E coli (STEC) NOT DETECTED NOT DETECTED Final   Shigella/Enteroinvasive E coli (EIEC) NOT DETECTED NOT DETECTED Final   Cryptosporidium NOT DETECTED NOT DETECTED Final   Cyclospora cayetanensis NOT DETECTED NOT DETECTED Final   Entamoeba histolytica NOT DETECTED NOT DETECTED Final   Giardia lamblia NOT DETECTED NOT DETECTED Final   Adenovirus F40/41 NOT DETECTED NOT DETECTED Final   Astrovirus NOT DETECTED NOT DETECTED Final   Norovirus GI/GII NOT DETECTED NOT DETECTED Final   Rotavirus A NOT DETECTED NOT DETECTED Final   Sapovirus (I, II, IV, and V) NOT DETECTED NOT DETECTED Final    Comment: Performed at [facility], [address], [place],  [address]         Radiology Studies: DG Chest Port 1 View  Result Date: 06/18/2022 CLINICAL DATA:  Fever, urinary retention EXAM: PORTABLE CHEST 1 VIEW COMPARISON:  06/13/2022 FINDINGS: Left pacer unchanged. Mild cardiomegaly. No confluent opacities, effusions or edema. No acute bony abnormality. IMPRESSION: Mild cardiomegaly.  No active disease. Electronically Signed   By: Rolm Baptise M.D.   On: 06/18/2022 01:08        Scheduled Meds:  sodium chloride   Intravenous Once   sodium chloride   Intravenous Once   amiodarone  200 mg Oral BID   atorvastatin  20 mg Oral Daily   famotidine  20 mg Oral Daily   lidocaine  1 patch Transdermal Q24H    predniSONE  10 mg Oral q morning   sodium bicarbonate  650 mg  Oral TID   tamsulosin  0.4 mg Oral QPC supper   Continuous Infusions:  sodium chloride Stopped (06/16/22 1409)   sodium chloride 20 mL/hr at 06/19/22 0632     LOS: 7 days    Time spent: 45 minutes spent on chart review, discussion with nursing staff, consultants, updating family and interview/physical exam; more than 50% of that time was spent in counseling and/or coordination of care.    Aliena Ghrist J [place] DO [facility] Available via Epic secure chat 7am-7pm After these hours, please refer to coverage provider listed on [email or web address] 06/19/2022, 12:51 PM

## 2022-06-19 NOTE — TOC Progression Note (Signed)
Transition of Care [facility]) - Progression Note    Patient Details  Name: Devin Mercado MRN: [id number] Date of Birth: 22-Jun-1951  [facility] CM/SW Contact  Pollie Friar, RN Phone Number: 06/19/2022, 11:21 AM  Clinical Narrative:    [facility] Dates on hold for today. Pt with platelets of 21. Pt also with low grade fevers.  CIR has auth but pt not medically ready.  TOC following.   Expected Discharge Plan: IP Rehab Facility Barriers to Discharge: Continued Medical Work up  Expected Discharge Plan and Services Expected Discharge Plan: [place]   Discharge Planning Services: CM Consult   Living arrangements for the past 2 months: Single Family Home                                       Social Determinants of Health (SDOH) Interventions    Readmission Risk Interventions    05/08/2022    9:44 AM  Readmission Risk Prevention Plan  Post Dischage Appt Complete  Medication Screening Complete  Transportation Screening Complete

## 2022-06-19 NOTE — Progress Notes (Signed)
Physical Therapy Treatment Patient Details Name: Devin Mercado MRN: [id number] DOB: 1951/04/25 Today's Date: 06/19/2022   History of Present Illness This is a 71 year old male presents with hematuria. 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 PHMx: prostate cancer with bone mets, anemia, pancytopenia. L Reverse TSA April 2023    PT Comments    Pt greeted up in recliner on arrival, receiving blood, therefore session limited. Pt with good tolerance for LE therex and HEP handout provided with pt able to demo back all exercises provided and verbalizing understanding of education re; importance and benefits of compliance. Current plan remains appropriate to address deficits and maximize functional independence and decrease caregiver burden. Pt continues to benefit from skilled PT services to progress toward functional mobility goals.    Recommendations for follow up therapy are one component of a multi-disciplinary discharge planning process, led by the attending physician.  Recommendations may be updated based on patient status, additional functional criteria and insurance authorization.  Follow Up Recommendations  Acute inpatient rehab (3hours/day)     Assistance Recommended at Discharge Frequent or constant Supervision/Assistance  Patient can return home with the following A lot of help with walking and/or transfers;A lot of help with bathing/dressing/bathroom;Assistance with cooking/housework;Assist for transportation;Help with stairs or ramp for entrance   Equipment Recommendations  None recommended by PT    Recommendations for Other Services       Precautions / Restrictions Precautions Precautions: Fall Precaution Comments: L TSA 10/2021 - very painful Restrictions Weight Bearing Restrictions: No     Mobility  Bed Mobility Overal bed mobility: Needs Assistance              General bed mobility comments: OOB in recliner at beginning and end of session    Transfers Overall transfer level: Needs assistance                 General transfer comment: deferred as pt recieving blood    Ambulation/Gait                   Stairs             Wheelchair Mobility    Modified Rankin (Stroke Patients Only) Modified Rankin (Stroke Patients Only) Pre-Morbid Rankin Score: No symptoms Modified Rankin: Moderately severe disability     Balance Overall balance assessment: Needs assistance Sitting-balance support: Feet supported, No upper extremity supported Sitting balance-Leahy Scale: Fair     Standing balance support: Bilateral upper extremity supported, During functional activity, Reliant on assistive device for balance Standing balance-Leahy Scale: Poor Standing balance comment: reliance on RW for support                            Cognition Arousal/Alertness: Awake/alert Behavior During Therapy: Flat affect Overall Cognitive Status: Impaired/Different from baseline Area of Impairment: Following commands, Problem solving, Awareness, Safety/judgement                       Following Commands: Follows one step commands consistently Safety/Judgement: Decreased awareness of deficits, Decreased awareness of safety Awareness: Emergent Problem Solving: Slow processing, Difficulty sequencing General Comments: Pt following most single step commands, required hands on assist for problem solving exercises, wife reports improved cognition        Exercises General Exercises - Lower Extremity Long Arc Quad: AROM, Right,  Left, 10 reps, Seated Hip Flexion/Marching: AROM, Right, Left, 10 reps, Seated Toe Raises: AROM, Right, Left, 10 reps, Seated Heel Raises: AROM, Right, Left, 10 reps, Seated Other Exercises Other Exercises: HEP for seated LE therex provided Access Code: [email or web address]    General Comments         Pertinent Vitals/Pain Pain Assessment Pain Assessment: Faces Faces Pain Scale: No hurt Pain Intervention(s): Monitored during session    Home Living                          Prior Function            PT Goals (current goals can now be found in the care plan section) Acute Rehab PT Goals PT Goal Formulation: With patient/family Time For Goal Achievement: 06/29/22    Frequency    Min 4X/week      PT Plan      Co-evaluation              AM-PAC PT "6 Clicks" Mobility   Outcome Measure  Help needed turning from your back to your side while in a flat bed without using bedrails?: A Little Help needed moving from lying on your back to sitting on the side of a flat bed without using bedrails?: A Lot Help needed moving to and from a bed to a chair (including a wheelchair)?: A Little Help needed standing up from a chair using your arms (e.g., wheelchair or bedside chair)?: A Lot Help needed to walk in hospital room?: A Lot Help needed climbing 3-5 steps with a railing? : Total 6 Click Score: 13    End of Session   Activity Tolerance: Treatment limited secondary to medical complications (Comment) (limited by pt recieving blood) Patient left: in chair;with call bell/phone within reach;with chair alarm set;with family/visitor present;Other (comment) (with IV team present) Nurse Communication: Mobility status PT Visit Diagnosis: Other abnormalities of gait and mobility (R26.89);Muscle weakness (generalized) (M62.81)     Time: 4163-8453 PT Time Calculation (min) (ACUTE ONLY): 12 min  Charges:  $Therapeutic Exercise: 8-22 mins                    Guenevere Roorda R. PTA Acute Rehabilitation Services Office: [place] 06/19/2022, 3:24 PM

## 2022-06-19 NOTE — Progress Notes (Signed)
Platelets 21 and Hemoglobin 7.2. Dr. [place] made aware. Received an order to transfuse RBC and Platelets. Type and screen in process.

## 2022-06-19 NOTE — Progress Notes (Addendum)
Rounding Note    Patient Name: Devin Mercado Date of Encounter: 06/18/2022  [facility] Cardiologist: Cristopher Peru, MD   Subjective   Remains weak, tired in general, more conversational this AM  Inpatient Medications    Scheduled Meds:  sodium chloride   Intravenous Once   amiodarone  200 mg Oral BID   atorvastatin  20 mg Oral Daily   famotidine  20 mg Oral Daily   lidocaine  1 patch Transdermal Q24H   predniSONE  10 mg Oral q morning   sodium bicarbonate  650 mg Oral TID   tamsulosin  0.4 mg Oral QPC supper   Continuous Infusions:  sodium chloride Stopped (06/16/22 1409)   PRN Meds: acetaminophen **OR** acetaminophen, LORazepam, morphine injection, oxyCODONE, senna-docusate, traMADol   Vital Signs    Vitals:   06/18/22 0210 06/18/22 0317 06/18/22 0405 06/18/22 0804  BP: 122/69 (!) 147/75 121/75 132/72  Pulse: 86 86 86 90  Resp: 20 (!) '21 20 18  '$ Temp: 99.4 F (37.4 C) 98.3 F (36.8 C) 98.4 F (36.9 C) 99.3 F (37.4 C)  TempSrc: Oral Oral Oral Oral  SpO2: 99% 100% 98% 100%  Weight:      Height:        Intake/Output Summary (Last 24 hours) at 06/18/2022 0930 Last data filed at 06/18/2022 0600 Gross per 24 hour  Intake 650 ml  Output 650 ml  Net 0 ml      06/01/2022   11:59 PM 05/23/2022    5:16 PM 05/19/2022    9:50 AM  Last 3 Weights  Weight (lbs) 227 lb 1.2 oz 225 lb 223 lb 9.6 oz  Weight (kg) 103 kg 102.059 kg 101.424 kg      Telemetry    SR/V paced - Personally Reviewed  ECG    No new EKGs - Personally Reviewed  Physical Exam   Exam is unchanged, laying comfortably supine and near flat GEN: No acute distress.   Neck: No JVD Cardiac: RRR, no murmurs, rubs, or gallops.  Respiratory: CTA b/l GI: Soft, nontender, non-distended  MS: No edema; No deformity. Psych: Normal affect   Labs    High Sensitivity Troponin:  No results for input(s): "TROPONINIHS" in the last 720 hours.   Chemistry Recent Labs  Lab 05/21/2022 1720  06/13/22 1901 06/14/22 0254 06/16/22 0715 06/17/22 0654 06/18/22 0330  NA 127* 125*   < > 129* 134* 131*  K 4.4 4.3   < > 3.2* 4.1 4.4  CL 96* 98   < > 100 107 97*  CO2 20* 18*   < > 17* 18* 18*  GLUCOSE 94 142*   < > 114* 102* 106*  BUN 36* 30*   < > 32* 32* 27*  CREATININE 1.24 1.17   < > 1.18 1.11 1.10  CALCIUM 7.7* 6.9*   < > 6.6* 7.1* 8.0*  MG  --  2.1  --   --   --   --   PROT 6.4*  --   --   --   --   --   ALBUMIN 3.6  --   --   --   --   --   AST 18  --   --   --   --   --   ALT 11  --   --   --   --   --   ALKPHOS 70  --   --   --   --   --  BILITOT 0.6  --   --   --   --   --   GFRNONAA >60 >60   < > >60 >60 >60  ANIONGAP 11 9   < > 12 9 16*   < > = values in this interval not displayed.    Lipids No results for input(s): "CHOL", "TRIG", "HDL", "LABVLDL", "LDLCALC", "CHOLHDL" in the last 168 hours.  Hematology Recent Labs  Lab 06/16/22 2309 06/17/22 0654 06/18/22 0330  WBC 3.8* 3.5* 4.0  RBC 2.48* 2.86* 3.13*  HGB 6.9* 8.3* 9.0*  HCT 20.4* 23.1* 25.4*  MCV 82.3 80.8 81.2  MCH 27.8 29.0 28.8  MCHC 33.8 35.9 35.4  RDW 17.2* 17.0* 17.2*  PLT 53* 44* 31*   Thyroid  Recent Labs  Lab 06/16/22 0748  TSH 19.164*  FREET4 0.69    BNPNo results for input(s): "BNP", "PROBNP" in the last 168 hours.  DDimer No results for input(s): "DDIMER" in the last 168 hours.   Radiology      Cardiac Studies   06/15/22: TTE 1. Difficult acoustic windows limit study Would recomm limited echo with  Definity to confirm LVEF and wall motion. There appears to be hypokinesis  of the mid/distal lateral, mid/distal septal, distal inferior walls and  apical akinesis.. Left ventricular   ejection fraction, by estimation, is 30%. The left ventricle has  moderately decreased function.   2. Right ventricular systolic function is normal. The right ventricular  size is normal. There is normal pulmonary artery systolic pressure.   3. The mitral valve is normal in structure. Trivial  mitral valve  regurgitation.   4. The aortic valve is normal in structure. Aortic valve regurgitation is  not visualized.   5. There is mild dilatation of the ascending aorta, measuring 42 mm.   6. The inferior vena cava is normal in size with greater than 50%  respiratory variability, suggesting right atrial pressure of 3 mmHg.   Patient Profile     71 y.o. male w/PMHx of HTN, CHB w/PPM, prostate cancer with mets (bone) admitted with stroke, interrogation of his PPM noted new onset Afib  Ongoing struggles with hematuria/hemorrhagic cystitis/pancytopenia  Assessment & Plan    New paroxysmal AFib CHA2DS2Vasc is 5, unable to anticoagulate with ongoing hematuria and thrombocytopenia  Dr Lovena Le discussed at length 06/16/22 with patient and his wife at bedside Started on amiodarone '200mg'$  BID in effort to suppress AF Elevated TSH noted with normal Free T4, ?sick euthyroid > treat as per IM team   2. New CM Unknown etiology, + WMA Compensated No ischemic w/u at this time though can revisit out patient Look towards adding BB/Entresto as his clinical course progresses, BP allows, and when neurology cleared for BP normalization  He does not appear volume OL  Add BB (Toprol) '25mg'$  daily when able to  Continue amiodarone Lorisa Scheid plan to advanced GDMT out patient as able   3. Recurrent fever for weeks/months ID on board Not felt to be infectious BC are neg (5 days) x2 Had fever 101 again TEE is indicated given PPM and immunocompromised state (chemo), though again cancelled 2/2 significant thrombocytopenia   4. Hematuria/hemorrhagic cystitis prostate cancer with bone mets, followed by oncology at [facility], anemia associated with bone marrow infiltration, pancytopenia from bone marrow suppression from Pluvicto  5. Anemia 6. Pancytopenia C/w IM and urology teams   Unfortunately low platelets have caused his TEE to be cancelled. Would pursue again when/if able, though ultimately defer to  IM and  ID teams Please call card master to schedule [place] follow [place] Continue amiodarone '200mg'$  BID Add Toprol '25mg'$  daily when able EP follow up is in place Please recall if needed, for now we Skye Plamondon see PRN    For questions or updates, please contact [place] Please consult [email or web address] for contact info under     Signed, Baldwin [place], PA-C  06/18/2022, 9:30 AM    I have seen and examined this patient with [name].  Agree with above, note added to reflect my findings.  Patient remains in sinus rhythm.  Feeling well without complaint.  GEN: Well nourished, well developed, in no acute distress  HEENT: normal  Neck: no JVD, carotid bruits, or masses Cardiac: RRR; no murmurs, rubs, or gallops,no edema  Respiratory:  clear to auscultation bilaterally, normal work of breathing GI: soft, nontender, nondistended, + BS MS: no deformity or atrophy  Skin: warm and dry Neuro:  Strength and sensation are intact Psych: euthymic mood, full affect   Paroxysmal atrial fibrillation: CHA2DS2-VASc of 5.  Currently not anticoagulated due to hematuria and thrombocytopenia.  Currently on amiodarone 200 mg twice daily.  Doneta Bayman arrange for follow-up in EP clinic. New cardiomyopathy: Unknown etiology.  Has a wall motion abnormality.  Harrie Cazarez be addressed as an outpatient. Recurrent fever: Blood cultures negative x 2.  Has continued to have fevers.  Immunocompromised due to chemotherapy.  TEE canceled due to anemia and thrombocytopenia. EP to sign off for now.  Medication and follow-up recommendations as above.  Ann Groeneveld M. Kilea Mccarey MD 06/19/2022 11:18 AM

## 2022-06-19 NOTE — Progress Notes (Signed)
Pt. Complaint of feeling full in bladder, bladder scanned with greater than 232, text messaged Dr. Eric [place] to I/O cath. One time. Pt tolerated procedure well, drained 400 of hematuria. Pt did state feeling relieved. Pt placed back on male purewick. Louanne Skye 06/19/22 6:30 PM

## 2022-06-19 NOTE — Consult Note (Signed)
Marland Kitchen   HEMATOLOGY/ONCOLOGY CONSULTATION NOTE  Date of Service: 06/19/2022  Patient Care Team: [name], MD as PCP - General (Family Medicine) [name], MD as PCP - Cardiology (Cardiology) [name], NP (Inactive) as Nurse Practitioner (Cardiology) [facility] as Consulting Physician [name], [name], MD as Consulting Physician (Medical Oncology)  CHIEF COMPLAINTS/PURPOSE OF CONSULTATION:  Evaluation of Pancytopenia  HISTORY OF PRESENTING ILLNESS:   Devin Mercado is a wonderful 71 y.o. male who has been referred to [place] by [name] for evaluation and management of pancytopenia. Patient's wife [name] is at bedside. 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  Patient has a family history of prostate cancer in his father had PSA screening at age 3 with a PSA of 8.15. Prostate cancer hx as noted below --08/2014: Underwent PSA screening at age 89 due to a +FH of prostate cancer (Father). PSA was 8.15 --09/05/14: TRUS Bx: Gleason 4+3=7 LLB 70% and 4+4=8 LLM, overall 5/12 cores all on left, high risk. --09/15/14: bone scan: tiny focus of uptake right 4th rib only. Prosthetic left hip. No metastases seen. CT NED --09/18/14: rib film showed old scar right 4th rib from prior fracture. --11/06/14: radical prostatectomy. Pathology: Gleason 4+5=9 adenocarcinoma bilaterally with positive margins on the left (extensive), +ECE, LVI (pT3a N0 R1) but no SVI. 0/7 nodes. Grade Group 5, high risk. 51 gram prostate, pattern 5 was 15% overall, acinar histology only, and <15% of gland involved by cancer. --12/22/14: PSA nadir 0.01 --02/16/15: PSA 0.04 --05/2015: Completed adjuvant XRT (Dr. Valere Dross) with some increased urinary incontinence, ED issues. No  ADT was given with RT. --05/2015: PSA 0.26 --08/31/15: PSA 0.89 --10/04/15: PSA 1.64, T 230, CT CAP and Bone Scan NED --11/21/15: PSA 2.74 Started on Lupron --02/13/16 PSA <0.01 T 12 --07/16/16 PSA < 0.01 Lupron held --01/26/17 PSA <0.01 T 63 --03/18/17 PSA <0.04 T 93 --06/17/17 PSA 0.90 T 138 --10/07/17 PSA 6.55 restarted on Lupron --03/24/19 PSA 11.57, started on darolutamide --04/15/19: PSA 2.83 --05/26/19: PSA 1.18 --10/27/19 L1 SBRT PSA 2.43 --12/29/19: PSA 0.44 --03/26/20: PSA 1.65 --05/07/20: PSA 3.65 --05/17/20: radium #1 --07/03/20: PSA 10.83 --07/12/20: Radium #2 --07/26/20: PSA17.89 --08/09/20: Radium #3 --09/06/20: Radium #4 --10/04/20: Radium #5 --10/18/20: PSA 36.18 --11/01/20: Radium #6 --01/07/21: C1 docetaxel; PSA 138. Bone only mCRPC. Bone scan: Redemonstrated multiple areas of increased radiotracer uptake. Reference lesions include bilateral anterior and posterior ribs, right superior iliac bone, L1 vertebral body. --01/28/21: C2 docetaxel; PSA 136.39 --02/11/21: PSA 94.28 --02/27/21: PSA 77.87; C3 docetaxel -- 03/20/21: PSA 68.22; C4 docetaxel, completed 8 cycles through 07/04/2021 PSA decline 57 04/11/2021-->44 05/02/2021 but rise 50.7 05/27/2021 and 56.8 07/04/2021 --08/08/21 PSMA-Lu177 #1, 09/20/21 #2, 11/21/21 #3, PSA 12.8 11/19/21 nadir --PSA drop to 17.6 12/05/21 --PROMISE germline testing recommended 12/05/21  Patient apparently had his last Pluvicto fifth cycle in early August 2023. He was last seen by his medical oncology team at [name] on 05/27/2022 for consideration of cabazitaxel but was noted to have severe anemia and thrombocytopenia and this was not an option currently. He received 2 units of PRBCs and [employer]. Patient presented to [name] on 11/30 with hematuria.  Patient was previously seen by urology [name] for hematuria and underwent bladder irrigation.  Subsequently at home started passing clots  and was unable to fully empty  his bladder leading to hospitalization.  Foley's catheter placed and was placed under continuous bladder irrigation.  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  06/14/2022 the patient developed right upper extremity ataxia visual field defects neurology was consulted.  CT head shows concern for embolic CVA in the right parietal lobe left occipital lobe.  MRI could not be done due to presence of pacemaker.  Pacemaker interrogation was notable for presence of newly noted A-fib.  Patient was transferred to [name] for further neurologic evaluation. TTE with LV ejection fraction of 30%. TEE is being planned but has been held pending platelet count of less than 50k  Patient also has had issues with fever of unknown origin with significantly elevated procalcitonin levels which are gradually coming down.  Infectious diseases following and Karius test was sent out on 06/18/2022.  Hematology was consulted to weigh in on the patient's pancytopenia.   I discussed possible considerations with the patient and his wife [name] at bedside. Patient had developed pancytopenia especially with significant anemia and thrombocytopenia since his Pluvicto treatment.  He also has previously had radioactive radium treatments for bone only metastatic disease with no significant visceral involvement as well as previous docetaxel chemotherapy.  He recently has had significant progression of his metastatic prostate cancer with PSA levels going from 17.6 on 12/05/2021 up to 940 on 06/04/2022 suggesting significant progression of his castrate resistant prostate cancer. He has had significant acute blood loss anemia from his significant ongoing hematuria related to radiation cystitis with possible active bleeding spots. There is also  concern that the patient might have sepsis with elevated procalcitonin levels which could suppress his bone marrow. We discussed that his platelets could be low of additionally from consumption of his bleeding as well as from dilution from PRBC transfusions.  Cannot rule out DIC from the malignancy as well as significant bleeding.   MEDICAL HISTORY:  Past Medical History:  Diagnosis Date   Arthritis    OA AND PAIN LEFT HIP   CHB (complete heart block) ([place])    a. MDT dual chamber pacemaker   HTN (hypertension)    Hyperlipidemia    Presence of permanent cardiac pacemaker    2000   Prostate cancer ([place]) 09/2014    SURGICAL HISTORY: Past Surgical History:  Procedure Laterality Date   ADENOIDECTOMY AS A CHILD     COLONOSCOPY WITH PROPOFOL N/A 12/30/2017   Procedure: COLONOSCOPY WITH PROPOFOL;  Surgeon: Ladene Artist, MD;  Location: [facility];  Service: Endoscopy;  Laterality: N/A;   JOINT REPLACEMENT     Right hip replacement Dr. Alvan Dame 01-05-18   LYMPHADENECTOMY Bilateral 11/06/2014   Procedure: PELVIC LYMPHADENECTOMY;  Surgeon: [name], MD;  Location: [facility];  Service: Urology;  Laterality: Bilateral;   PACEMAKER INSERTION  2000; 2012   MDT dual chamber pacemaker implanted 2000 with gen change 2012   POLYPECTOMY  12/30/2017   Procedure: POLYPECTOMY;  Surgeon: Ladene Artist, MD;  Location: [facility];  Service: Endoscopy;;   PROSTATE BIOPSY  09/05/14   REVERSE SHOULDER ARTHROPLASTY Left 10/15/2021   Procedure: REVERSE SHOULDER ARTHROPLASTY;  Surgeon: Justice Britain, MD;  Location: [facility];  Service: Orthopedics;  Laterality: Left;   ROBOT ASSISTED LAPAROSCOPIC RADICAL PROSTATECTOMY N/A 11/06/2014   Procedure: ROBOTIC  ASSISTED LAPAROSCOPIC RADICAL PROSTATECTOMY LEVEL 2;  Surgeon: [name], MD;  Location: [facility];  Service: Urology;  Laterality: N/A;   TOTAL HIP ARTHROPLASTY Left 07/03/2014   Procedure: LEFT TOTAL HIP ARTHROPLASTY ANTERIOR APPROACH;  Surgeon: [name],  MD;  Location: [facility];  Service: Orthopedics;  Laterality: Left;   TOTAL HIP ARTHROPLASTY Right 01/05/2018   Procedure: RIGHT TOTAL HIP ARTHROPLASTY ANTERIOR APPROACH;  Surgeon: Paralee Cancel, MD;  Location: [facility];  Service: Orthopedics;  Laterality: Right;  70 mins   WISDOM TEETH EXTRACTIONS      SOCIAL HISTORY: Social History   Socioeconomic History   Marital status: Married    Spouse name: Not on file   Number of children: 4   Years of education: Not on file   Highest education level: Not on file  Occupational History   Occupation: attorney  Tobacco Use   Smoking status: Never   Smokeless tobacco: Never  Vaping Use   Vaping Use: Never used  Substance and Sexual Activity   Alcohol use: Yes    Alcohol/week: 2.0 - 3.0 standard drinks of alcohol    Types: 2 - 3 Standard drinks or equivalent per week    Comment: 5 TO 7 DRINKS A WEEK stopped etoh use pending surgery    Drug use: No   Sexual activity: Yes  Other Topics Concern   Not on file  Social History Narrative   Married to Dr. [name] 1979. 4 kids (1 lost to auto accident-3 surviving), no grandkids. 1 daughter married in 2016 and works as Forensic psychologist in [place].       Retired Forensic psychologist       Hobbies: [place], vacation   Social Determinants of Radio broadcast assistant Strain: Not on file  Food Insecurity: No [place] (06/13/2022)   Hunger Vital Sign    Worried About Running Out of Food in the Last Year: Never true    [place] in the Last Year: Never true  Transportation Needs: No Transportation Needs (06/13/2022)   PRAPARE - Hydrologist (Medical): No    Lack of Transportation (Non-Medical): No  Physical Activity: Not on file  Stress: Not on file  Social Connections: Not on file  Intimate Partner Violence: Not At Risk (06/13/2022)   Humiliation, Afraid, Rape, and Kick questionnaire    Fear of Current or Ex-Partner: No    Emotionally Abused: No    Physically Abused: No     Sexually Abused: No    FAMILY HISTORY: Family History  Problem Relation Age of Onset   Stroke Mother        multiple. 44.    Heart disease Mother        hx heart valve problem   Prostate cancer Father        69 of prostate cancer   Heart attack Neg Hx    Hypertension Neg Hx     ALLERGIES:  has No Known Allergies.  MEDICATIONS:  Current Facility-Administered Medications  Medication Dose Route Frequency Provider Last Rate Last Admin   0.9 %  sodium chloride infusion (Manually program via Guardrails IV Fluids)   Intravenous Once Elgergawy, Silver Huguenin, MD       0.9 %  sodium chloride infusion   Intravenous Continuous Elgergawy, Silver Huguenin, MD   Stopped at 06/16/22 1409   0.9 %  sodium chloride infusion   Intravenous Continuous Almyra Deforest, PA 20 mL/hr at 06/19/22 3825 New Bag at  06/19/22 7846   acetaminophen (TYLENOL) tablet 650 mg  650 mg Oral Q6H PRN Quintella Baton, MD   650 mg at 06/19/22 9629   Or   acetaminophen (TYLENOL) suppository 650 mg  650 mg Rectal Q6H PRN Crosley, Debby, MD       amiodarone (PACERONE) tablet 200 mg  200 mg Oral BID Baldwin [place], PA-C   200 mg at 06/19/22 5284   atorvastatin (LIPITOR) tablet 20 mg  20 mg Oral Daily Dessa Phi, DO   20 mg at 06/19/22 0951   famotidine (PEPCID) tablet 20 mg  20 mg Oral Daily Elgergawy, Silver Huguenin, MD   20 mg at 06/19/22 0952   lidocaine (LIDODERM) 5 % 1 patch  1 patch Transdermal Q24H Dessa Phi, DO   1 patch at 06/19/22 1324   LORazepam (ATIVAN) injection 0.5 mg  0.5 mg Intravenous Once PRN Howerter, Justin B, DO       morphine (PF) 2 MG/ML injection 2 mg  2 mg Intravenous Q4H PRN Crosley, Debby, MD   2 mg at 06/13/22 0920   oxyCODONE (Oxy IR/ROXICODONE) immediate release tablet 5 mg  5 mg Oral Q6H PRN Claria Dice, Debby, MD   5 mg at 06/19/22 1611   predniSONE (DELTASONE) tablet 10 mg  10 mg Oral q morning Crosley, Debby, MD   10 mg at 06/19/22 4010   senna-docusate (Senokot-S) tablet 1 tablet  1 tablet Oral QHS PRN  Quintella Baton, MD   1 tablet at 06/19/22 0125   sodium bicarbonate tablet 650 mg  650 mg Oral TID Elgergawy, Silver Huguenin, MD   650 mg at 06/19/22 1534   tamsulosin (FLOMAX) capsule 0.4 mg  0.4 mg Oral QPC supper Quintella Baton, MD   0.4 mg at 06/19/22 1810   traMADol (ULTRAM) tablet 50 mg  50 mg Oral Q8H PRN Dessa Phi, DO        REVIEW OF SYSTEMS:    10 Point review of Systems was done is negative except as noted above.  PHYSICAL EXAMINATION: ECOG PERFORMANCE STATUS: 3 - Symptomatic, >50% confined to bed  . Vitals:   06/19/22 1656 06/19/22 1830  BP: 117/60 119/62  Pulse: 78   Resp: 18 18  Temp:  98.8 F (37.1 C)  SpO2:  98%   Filed Weights   05/20/2022 1716 05/26/2022 2359  Weight: 225 lb (102.1 kg) 227 lb 1.2 oz (103 kg)   .Body mass index is 30.8 kg/m.  GENERAL:alert, tired appearing and resting in bed SKIN: Petechiae/ecchymosis over extremities OROPHARYNX: MMM LYMPH:  no palpable lymphadenopathy in the cervical, axillary or inguinal regions LUNGS: clear to auscultation b/l with normal respiratory effort HEART:irregular ABDOMEN:  normoactive bowel sounds , non tender, not distended. Extremity: b/l 2-3+ pedal edema PSYCH: alert & oriented x 3 with fluent speech  LABORATORY DATA:  I have reviewed the data as listed  .    Latest Ref Rng & Units 06/19/2022    5:55 AM 06/18/2022    3:30 AM 06/17/2022    6:54 AM  CBC  WBC 4.0 - 10.5 K/uL 3.3  4.0  3.5   Hemoglobin 13.0 - 17.0 g/dL 7.2  9.0  8.3   Hematocrit 39.0 - 52.0 % 20.7  25.4  23.1   Platelets 150 - 400 K/uL 21  31  44     .    Latest Ref Rng & Units 06/19/2022    5:55 AM 06/18/2022    3:30 AM 06/17/2022    6:54 AM  CMP  Glucose 70 - 99 mg/dL 116  106  102   BUN 8 - 23 mg/dL 25  27  32   Creatinine 0.61 - 1.24 mg/dL 1.14  1.10  1.11   Sodium 135 - 145 mmol/L 128  131  134   Potassium 3.5 - 5.1 mmol/L 4.3  4.4  4.1   Chloride 98 - 111 mmol/L 100  97  107   CO2 22 - 32 mmol/L '18  18  18   '$ Calcium 8.9 -  10.3 mg/dL 7.0  8.0  7.1      RADIOGRAPHIC STUDIES: I have personally reviewed the radiological images as listed and agreed with the findings in the report. DG Chest Port 1 View  Result Date: 06/18/2022 CLINICAL DATA:  Fever, urinary retention EXAM: PORTABLE CHEST 1 VIEW COMPARISON:  06/13/2022 FINDINGS: Left pacer unchanged. Mild cardiomegaly. No confluent opacities, effusions or edema. No acute bony abnormality. IMPRESSION: Mild cardiomegaly.  No active disease. Electronically Signed   By: Rolm Baptise M.D.   On: 06/18/2022 01:08   CT HEAD WO CONTRAST (5MM)  Result Date: 06/15/2022 CLINICAL DATA:  Stroke follow-up EXAM: CT HEAD WITHOUT CONTRAST TECHNIQUE: Contiguous axial images were obtained from the base of the skull through the vertex without intravenous contrast. RADIATION DOSE REDUCTION: This exam was performed according to the departmental dose-optimization program which includes automated exposure control, adjustment of the mA and/or kV according to patient size and/or use of iterative reconstruction technique. COMPARISON:  06/14/2022 FINDINGS: Brain: There are areas of loss of gray-white differentiation in the left occipital lobe and right parietal lobe, unchanged. No acute hemorrhage. Mild generalized volume loss. There is periventricular hypoattenuation compatible with chronic microvascular disease. Incidentally noted cavum septum pellucidum et vergae. Vascular: No hyperdense vessel or unexpected calcification. Skull: Normal. Negative for fracture or focal lesion. Sinuses/Orbits: No acute finding. Other: None. IMPRESSION: Unchanged areas of loss of gray-white differentiation in the left occipital lobe and right parietal lobe, consistent with acute/subacute infarcts. No acute hemorrhage. Electronically Signed   By: Ulyses Jarred M.D.   On: 06/15/2022 21:54   ECHOCARDIOGRAM COMPLETE  Result Date: 06/15/2022    ECHOCARDIOGRAM REPORT   Patient Name:   COE ANGELOS [facility] Date of Exam: 06/15/2022  Medical Rec #:  [id number]        Height:       72.0 in Accession #:    [id number]       Weight:       227.1 lb Date of Birth:  [birth year]         BSA:          2.249 m Patient Age:    18 years         BP:           120/59 mmHg Patient Gender: M                HR:           88 bpm. Exam Location:  Inpatient Procedure: 2D Echo, Color Doppler and Cardiac Doppler Indications:    Stroke i63.9  History:        Patient has no prior history of Echocardiogram examinations.                 Pacemaker; Risk Factors:Hypertension and Dyslipidemia.  Sonographer:    Raquel Sarna Senior RDCS Referring Phys: [phone] [facility]  Sonographer Comments: Technically difficult due to poor echo windows. IMPRESSIONS  1. Difficult acoustic windows limit study Would  recomm limited echo with Definity to confirm LVEF and wall motion. There appears to be hypokinesis of the mid/distal lateral, mid/distal septal, distal inferior walls and apical akinesis.. Left ventricular  ejection fraction, by estimation, is 30%. The left ventricle has moderately decreased function.  2. Right ventricular systolic function is normal. The right ventricular size is normal. There is normal pulmonary artery systolic pressure.  3. The mitral valve is normal in structure. Trivial mitral valve regurgitation.  4. The aortic valve is normal in structure. Aortic valve regurgitation is not visualized.  5. There is mild dilatation of the ascending aorta, measuring 42 mm.  6. The inferior vena cava is normal in size with greater than 50% respiratory variability, suggesting right atrial pressure of 3 mmHg. FINDINGS  Left Ventricle: Difficult acoustic windows limit study Would recomm limited echo with Definity to confirm LVEF and wall motion. There appears to be hypokinesis of the mid/distal lateral, mid/distal septal, distal inferior walls and apical akinesis. Left  ventricular ejection fraction, by estimation, is 30%. The left ventricle has moderately decreased function. The left  ventricular internal cavity size was normal in size. There is no left ventricular hypertrophy. Right Ventricle: The right ventricular size is normal. Right vetricular wall thickness was not assessed. Right ventricular systolic function is normal. There is normal pulmonary artery systolic pressure. The tricuspid regurgitant velocity is 2.31 m/s, and with an assumed right atrial pressure of 3 mmHg, the estimated right ventricular systolic pressure is 25.8 mmHg. Left Atrium: Left atrial size was normal in size. Right Atrium: Right atrial size was normal in size. Pericardium: Trivial pericardial effusion is present. Mitral Valve: The mitral valve is normal in structure. Trivial mitral valve regurgitation. Tricuspid Valve: The tricuspid valve is grossly normal. Tricuspid valve regurgitation is mild. Aortic Valve: The aortic valve is normal in structure. Aortic valve regurgitation is not visualized. Pulmonic Valve: The pulmonic valve was not well visualized. Pulmonic valve regurgitation is not visualized. No evidence of pulmonic stenosis. Aorta: There is mild dilatation of the ascending aorta, measuring 42 mm. Venous: The inferior vena cava is normal in size with greater than 50% respiratory variability, suggesting right atrial pressure of 3 mmHg. IAS/Shunts: No atrial level shunt detected by color flow Doppler. Additional Comments: A device lead is visualized.  LEFT VENTRICLE PLAX 2D LVIDd:         5.50 cm LVIDs:         3.30 cm LV PW:         1.10 cm LV IVS:        1.20 cm LVOT diam:     2.40 cm LV SV:         85 LV SV Index:   38 LVOT Area:     4.52 cm  LV Volumes (MOD) LV vol d, MOD A2C: 112.0 ml LV vol d, MOD A4C: 139.0 ml LV vol s, MOD A2C: 65.0 ml LV vol s, MOD A4C: 72.7 ml LV SV MOD A2C:     47.0 ml LV SV MOD A4C:     139.0 ml LV SV MOD BP:      56.6 ml RIGHT VENTRICLE RV S prime:     17.70 cm/s TAPSE (M-mode): 2.2 cm LEFT ATRIUM             Index        RIGHT ATRIUM           Index LA diam:        3.60 cm 1.60  cm/m  RA Area:     21.10 cm LA Vol (A2C):   66.4 ml 29.53 ml/m  RA Volume:   60.60 ml  26.95 ml/m LA Vol (A4C):   63.5 ml 28.24 ml/m LA Biplane Vol: 65.8 ml 29.26 ml/m  AORTIC VALVE LVOT Vmax:   102.00 cm/s LVOT Vmean:  71.100 cm/s LVOT VTI:    0.188 m  AORTA Ao Root diam: 4.00 cm Ao Asc diam:  4.20 cm TRICUSPID VALVE TR Peak grad:   21.3 mmHg TR Vmax:        231.00 cm/s  SHUNTS Systemic VTI:  0.19 m Systemic Diam: 2.40 cm Dorris Carnes MD Electronically signed by Dorris Carnes MD Signature Date/Time: 06/15/2022/4:20:16 PM    Final    CT ANGIO HEAD NECK W WO CM W PERF (CODE STROKE)  Result Date: 06/14/2022 CLINICAL DATA:  Neuro deficit, acute, stroke suspected. Right-sided weakness. EXAM: CT ANGIOGRAPHY HEAD AND NECK TECHNIQUE: Multidetector CT imaging of the head and neck was performed using the standard protocol during bolus administration of intravenous contrast. Multiplanar CT image reconstructions and MIPs were obtained to evaluate the vascular anatomy. Carotid stenosis measurements (when applicable) are obtained utilizing NASCET criteria, using the distal internal carotid diameter as the denominator. RADIATION DOSE REDUCTION: This exam was performed according to the departmental dose-optimization program which includes automated exposure control, adjustment of the mA and/or kV according to patient size and/or use of iterative reconstruction technique. CONTRAST:  125m OMNIPAQUE IOHEXOL 350 MG/ML SOLN COMPARISON:  CT head without contrast 06/14/2022 at 2:50 p.m. FINDINGS: CTA NECK FINDINGS Aortic arch: Minimal atherosclerotic changes are present the distal aorta. Three vessel arch configuration is present. No significant stenosis is present at the great vessel origins. Right carotid system: Right common carotid artery is within normal limits. Minimal calcifications are present proximal right ICA without significant stenosis. Mild tortuosity is present in the distal cervical right ICA without significant  stenosis. Left carotid system: The left common carotid artery is within normal limits. Bifurcation is unremarkable. Mild tortuosity is present cervical left ICA without significant stenosis. Vertebral arteries: The left vertebral artery is the dominant vessel. Both vertebral arteries originate from the subclavian arteries without significant stenosis. No significant stenosis is present in either vertebral artery in the neck. Skeleton: Multiple sclerotic lesions are present throughout the cervical and upper thoracic spine. Diffuse vertebral body involvement is present at T2 and T3. Asymmetric right-sided involvement is present at T1 and T2. No pathologic fractures are present. Sclerotic lesions are present within ribs bilaterally. Other neck: Soft tissues the neck are otherwise unremarkable. Salivary glands are within normal limits. Thyroid is normal. No significant adenopathy is present. No focal mucosal or submucosal lesions are present. Upper chest: The lung apices are clear. Review of the MIP images confirms the above findings CTA HEAD FINDINGS Anterior circulation: Minimal atherosclerotic changes are present within the cavernous internal carotid arteries bilaterally. The A1 and M1 segments are normal. The anterior communicating artery is patent. MCA bifurcations are within normal limits bilaterally. The ACA and MCA branch vessels are within normal limits bilaterally. Posterior circulation: The left vertebral artery is dominant vessel. PICA origins are visualized and normal. Vertebrobasilar junction and basilar artery is normal. Both posterior cerebral arteries originate from basilar tip. The PCA branch vessels are normal. Venous sinuses: The dural sinuses are patent. The straight sinus and deep cerebral veins are intact. Cortical veins are within normal limits. No significant vascular malformation is evident. Anatomic variants: None Review of the MIP images confirms the  above findings IMPRESSION: 1. No emergent  large vessel occlusion. 2. Minimal atherosclerotic changes within the proximal right ICA and cavernous internal carotid arteries bilaterally without significant stenosis. 3. Multiple sclerotic lesions throughout the cervical and upper thoracic spine and ribs bilaterally consistent with metastatic disease. No pathologic fractures are present. Electronically Signed   By: San Morelle M.D.   On: 06/14/2022 18:26   CT HEAD WO CONTRAST (5MM)  Result Date: 06/14/2022 CLINICAL DATA:  Abnormal CT of the head. Delayed images were obtained after CTA head to evaluate for possible metastatic disease. EXAM: CT HEAD WITHOUT CONTRAST TECHNIQUE: Contiguous axial images were obtained from the base of the skull through the vertex without intravenous contrast. RADIATION DOSE REDUCTION: This exam was performed according to the departmental dose-optimization program which includes automated exposure control, adjustment of the mA and/or kV according to patient size and/or use of iterative reconstruction technique. COMPARISON:  CT head without contrast 06/14/2022 at 2:50 p.m. FINDINGS: Brain: Delayed images demonstrate no pathologic enhancement. Infarcts involving the posterior right parietal lobe and left occipital lobe are again seen. Central left cerebellar infarct is better visualized. Mild atrophy and white matter changes are stable. No significant interval change is present. Vascular: Normal intravascular enhancement is present. Skull: Calvarium is intact. No focal lytic or blastic lesions are present. No significant extracranial soft tissue lesion is present. Sinuses/Orbits: The paranasal sinuses and mastoid air cells are clear. The globes and orbits are within normal limits. IMPRESSION: 1. No pathologic enhancement to suggest metastatic disease. 2. Stable infarcts involving the posterior right parietal lobe and left occipital lobe. 3. Central left cerebellar infarct is better visualized. 4. Stable atrophy and white  matter disease. This likely reflects the sequela of chronic microvascular ischemia. Electronically Signed   By: San Morelle M.D.   On: 06/14/2022 18:14   CT HEAD CODE STROKE WO CONTRAST  Result Date: 06/14/2022 CLINICAL DATA:  Code stroke. Neuro deficit, acute, stroke suspected. Right-sided weakness. EXAM: CT HEAD WITHOUT CONTRAST TECHNIQUE: Contiguous axial images were obtained from the base of the skull through the vertex without intravenous contrast. RADIATION DOSE REDUCTION: This exam was performed according to the departmental dose-optimization program which includes automated exposure control, adjustment of the mA and/or kV according to patient size and/or use of iterative reconstruction technique. COMPARISON:  . FINDINGS: Brain: Focal hypoattenuation is present in the posterior right parietal lobe measuring up to 2.5 cm. Focal cortical hypoattenuation is also present in the posterior left occipital lobe on image 12 series 3 and image 41 of series 7. No other acute or focal cortical abnormalities are present. Deep brain nuclei are within normal limits. Insular ribbon is normal bilaterally. Cavum septum pellucidum noted. Ventricles are otherwise within normal limits. No significant extraaxial fluid collection is present. The brainstem and cerebellum are within normal limits. Vascular: No hyperdense vessel or unexpected calcification. Skull: Calvarium is intact. No focal lytic or blastic lesions are present. No significant extracranial soft tissue lesion is present. Sinuses/Orbits: The paranasal sinuses and mastoid air cells are clear. The globes and orbits are within normal limits. ASPECTS [facility] Stroke Program Early CT Score) - Ganglionic level infarction (caudate, lentiform nuclei, internal capsule, insula, M1-M3 cortex): 6/7 - Supraganglionic infarction (M4-M6 cortex): 3/3 Total score (0-10 with 10 being normal): 9/10 IMPRESSION: 1. Focal hypoattenuation in the posterior right parietal lobe  measuring up to 2.5 cm. This is concerning for acute or subacute infarct. 2. Focal cortical hypoattenuation in the posterior left occipital lobe is also concerning for acute  or subacute infarct. 3. No other acute or focal cortical abnormalities. 4. Aspects is 9/10. The above was relayed via text pager to Dr. Lyn Records on 06/14/2022 at 15:08. Page Electronically Signed   By: San Morelle M.D.   On: 06/14/2022 15:09   DG CHEST PORT 1 VIEW  Result Date: 06/13/2022 CLINICAL DATA:  Encounter for SOB [id number] SOB (shortness of breath) [id number] EXAM: PORTABLE CHEST 1 VIEW COMPARISON:  06/26/2014 FINDINGS: LEFT-sided pacemaker overlies normal cardiac silhouette. No effusion, infiltrate or pneumothorax. Versus arthroplasty LEFT shoulder. IMPRESSION: No acute cardiopulmonary process. Electronically Signed   By: Suzy Bouchard M.D.   On: 06/13/2022 14:34    ASSESSMENT & PLAN:   71 year old with castrate resistant metastatic prostate cancer status post extensive treatments at [facility] as detailed above with  #1 Metastatic castrate resistant prostate cancer Status post extensive treatments as noted above in HPI. Getting treated at [facility] Recently on Pluvicto x 5 cycles with the last treatment in August. Has had extensive progression since then and could not be started on planned cabazitaxel chemotherapy due to progressive cytopenias.   Whole-body bone scan on 05/01/2022 showed  Impression:  New radiotracer uptake in the right posterior approximate ninth rib as well  as increased conspicuity of bilateral posterior rib uptake, which may  represent worsening metastatic disease.  Attention on follow up.   CT chest abdomen pelvis with contrast on 05/01/2022 showed  Impression:   1. No new metastatic disease in the chest, abdomen, pelvis.  2. Stable diffuse osseous metastatic disease. Correlate with same day bone  scan.   #2 pancytopenia -this appears to  primarily be related to the patient's previous history of multiple treatments that could cause bone marrow injury/suppression. Especially his previous use of radioactive radium and recent use of Pluvicto.  In addition patient has also had docetaxel previously. 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  Given significant anisopoikilocytosis with ovalocytes and teardrop cells as well as nucleated red blood cells and some immature Granulocytes in the peripheral blood cannot rule out the possibility of a myelophthisic picture with bone marrow infiltration by prostate cancer.  In addition the patient had elevated procalcitonin levels and fevers of unknown origin and sepsis could also cause additional bone marrow suppression.  Metastatic malignancy as well as sepsis can trigger DIC to be ruled out as well.   #3 hematuria thought to be related to radiation cystitis.  Ongoing and causing acute blood loss anemia.  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  #5 new onset atrial fibrillation  #6 newly noted systolic CHF.  #7 fever of unknown origin with elevated procalcitonin levels.  Infectious disease following and ruling out possible endocarditis or pacemaker lead infection. If no other infectious etiology is found tumor fever is certainly a possibility given significant progression. PLAN -Transfuse PRBC as needed for hemoglobin less than 8 in the context of possible ischemic cardiomyopathy and stroke as well as high risk of delirium. -Transfuse platelets if actively bleeding or to maintain platelets more than 20k .  Will have to transfuse platelets to maintain platelets close to 50,000 if TEE is strongly indicated or a cystoscopy is planned for fulguration of any actively  bleeding bladder lesions. -Will check DIC panel, immature platelet fraction, LDH, haptoglobin to rule out any  malignancy related thrombotic microangiopathy. -Check coags. -Discussed goals of care in detail and concerns for prostate cancer progression. -Also discussed possibility of getting a bone marrow examination to evaluate for possible bone marrow involvement by metastatic prostate cancer. -Hematology will be available for additional questions  .The total time spent in the appointment was 81 minutes* .  All of the patient's questions were answered with apparent satisfaction. The patient knows to call the clinic with any problems, questions or concerns.   Sullivan Lone MD MS AAHIVMS [facility] Hematology/Oncology Physician [facility]  .*Total Encounter Time as defined by the Centers for Medicare and Medicaid Services includes, in addition to the face-to-face time of a patient visit (documented in the note above) non-face-to-face time: obtaining and reviewing outside history, ordering and reviewing medications, tests or procedures, care coordination (communications with other health care professionals or caregivers) and documentation in the medical record.  06/19/2022 6:46 PM

## 2022-06-19 NOTE — Progress Notes (Addendum)
Patient ID: Devin Mercado, male   DOB: January 28, 1951, 71 y.o.   MRN: [id number]    Subjective: No changes over last 24 hrs.  Still voiding well and emptying bladder but urine remains dark red.  Denies clots and no need for in and out catheterization over last 24 hrs.  Objective: Vital signs in last 24 hours: Temp:  [98.1 F (36.7 C)-103 F (39.4 C)] 101.3 F (38.5 C) (12/07 0337) Pulse Rate:  [84-110] 105 (12/07 0337) Resp:  [16-20] 16 (12/07 0337) BP: (111-154)/(45-90) 122/75 (12/07 0337) SpO2:  [97 %-100 %] 98 % (12/07 0337)  Intake/Output from previous day: 12/06 0701 - 12/07 0700 In: 700 [Blood:700] Out: 1125 [Urine:1125] Intake/Output this shift: No intake/output data recorded.  Physical Exam:  General: Alert and oriented GU: Urine dark red  Lab Results: Recent Labs    06/16/22 2309 06/17/22 0654 06/18/22 0330  HGB 6.9* 8.3* 9.0*  HCT 20.4* 23.1* 25.4*   BMET Recent Labs    06/17/22 0654 06/18/22 0330  NA 134* 131*  K 4.1 4.4  CL 107 97*  CO2 18* 18*  GLUCOSE 102* 106*  BUN 32* 27*  CREATININE 1.11 1.10  CALCIUM 7.1* 8.0*     Studies/Results: DG Chest Port 1 View  Result Date: 06/18/2022 CLINICAL DATA:  Fever, urinary retention EXAM: PORTABLE CHEST 1 VIEW COMPARISON:  06/13/2022 FINDINGS: Left pacer unchanged. Mild cardiomegaly. No confluent opacities, effusions or edema. No acute bony abnormality. IMPRESSION: Mild cardiomegaly.  No active disease. Electronically Signed   By: Rolm Baptise M.D.   On: 06/18/2022 01:08    Assessment/Plan: 1) Castrate resistant metastatic prostate cancer: On ADT. Chemotherapy held per Oncology at [facility]. 2) Radiation cystitis/hematuria: No absolute indication for catheter placement.  He still prefers to avoid a catheter at this time.  Continue to in and out catheterize prn if retention develops.  If unsuccessful or if requiring repeated in and out catheterization, will consider hematuria catheter placement.  Plan to start  hyperbaric oxygen therapy 12/19.  Continue to monitor serial Hgb, awaiting AM labs.   LOS: 7 days   [name] 06/19/2022, 7:11 AM   Addendum: Hgb 7.2, PLT 44,000.  Continue to monitor serial Hgb as above and transfuse prn.  If persistent drop in Hgb requiring transfusions, will discuss cystoscopy in OR with fulguration/clot evacuation/etc to try to help stop any active bleeding.

## 2022-06-19 NOTE — Progress Notes (Addendum)
Brief ID Note:   Karius paperwork completed and taken to main lab for specimen processing.  Karius test ID [id number]  Noted TEE moved to another time d/t TTP (> 30K)  Temp ranges 101 - 103 last 24h w/o new localizing symptoms.    Janene Madeira, MSN, NP-C [facility] for Infectious Disease [email or web address] Pager: [phone] Office: [phone] [facility] Main Line: [place] Communication Welcome

## 2022-06-20 ENCOUNTER — Other Ambulatory Visit (HOSPITAL_COMMUNITY): Payer: Medicare PPO

## 2022-06-20 ENCOUNTER — Inpatient Hospital Stay (HOSPITAL_COMMUNITY): Payer: Medicare PPO | Admitting: Anesthesiology

## 2022-06-20 ENCOUNTER — Encounter (HOSPITAL_COMMUNITY): Admission: EM | Disposition: E | Payer: Self-pay | Source: Home / Self Care | Attending: Internal Medicine

## 2022-06-20 ENCOUNTER — Inpatient Hospital Stay (HOSPITAL_COMMUNITY): Payer: Medicare PPO

## 2022-06-20 DIAGNOSIS — N3041 Irradiation cystitis with hematuria: Secondary | ICD-10-CM

## 2022-06-20 DIAGNOSIS — R310 Gross hematuria: Secondary | ICD-10-CM

## 2022-06-20 DIAGNOSIS — R509 Fever, unspecified: Secondary | ICD-10-CM | POA: Diagnosis not present

## 2022-06-20 DIAGNOSIS — I4891 Unspecified atrial fibrillation: Secondary | ICD-10-CM | POA: Diagnosis not present

## 2022-06-20 DIAGNOSIS — D61818 Other pancytopenia: Secondary | ICD-10-CM

## 2022-06-20 HISTORY — PX: CYSTOSCOPY: SHX5120

## 2022-06-20 LAB — RETICULOCYTES
Immature Retic Fract: 8.1 % (ref 2.3–15.9)
RBC.: 3.04 MIL/uL — ABNORMAL LOW (ref 4.22–5.81)
Retic Count, Absolute: 18.5 10*3/uL — ABNORMAL LOW (ref 19.0–186.0)
Retic Ct Pct: 0.6 % (ref 0.4–3.1)

## 2022-06-20 LAB — BPAM RBC
Blood Product Expiration Date: 202312272359
Blood Product Expiration Date: 202312272359
ISSUE DATE / TIME: 202312071321
ISSUE DATE / TIME: 202312072114
Unit Type and Rh: 6200
Unit Type and Rh: 6200

## 2022-06-20 LAB — TYPE AND SCREEN
ABO/RH(D): A POS
Antibody Screen: NEGATIVE
Unit division: 0
Unit division: 0

## 2022-06-20 LAB — PREPARE PLATELET PHERESIS
Unit division: 0
Unit division: 0
Unit division: 0
Unit division: 0

## 2022-06-20 LAB — BASIC METABOLIC PANEL
Anion gap: 13 (ref 5–15)
BUN: 29 mg/dL — ABNORMAL HIGH (ref 8–23)
CO2: 19 mmol/L — ABNORMAL LOW (ref 22–32)
Calcium: 6.7 mg/dL — ABNORMAL LOW (ref 8.9–10.3)
Chloride: 97 mmol/L — ABNORMAL LOW (ref 98–111)
Creatinine, Ser: 1.27 mg/dL — ABNORMAL HIGH (ref 0.61–1.24)
GFR, Estimated: 60 mL/min — ABNORMAL LOW (ref >60)
Glucose, Bld: 115 mg/dL — ABNORMAL HIGH (ref 70–99)
Potassium: 4.2 mmol/L (ref 3.5–5.1)
Sodium: 129 mmol/L — ABNORMAL LOW (ref 135–145)

## 2022-06-20 LAB — BPAM PLATELET PHERESIS
Blood Product Expiration Date: 202312082359
Blood Product Expiration Date: 202312082359
Blood Product Expiration Date: 202312092359
Blood Product Expiration Date: 202312092359
ISSUE DATE / TIME: 202312061124
ISSUE DATE / TIME: 202312061455
ISSUE DATE / TIME: 202312071622
ISSUE DATE / TIME: 202312072026
Unit Type and Rh: 5100
Unit Type and Rh: 6200
Unit Type and Rh: 6200
Unit Type and Rh: 6200

## 2022-06-20 LAB — CBC
HCT: 24.5 % — ABNORMAL LOW (ref 39.0–52.0)
HCT: 24.8 % — ABNORMAL LOW (ref 39.0–52.0)
Hemoglobin: 8.8 g/dL — ABNORMAL LOW (ref 13.0–17.0)
Hemoglobin: 8.7 g/dL — ABNORMAL LOW (ref 13.0–17.0)
MCH: 29.1 pg (ref 26.0–34.0)
MCH: 29.1 pg (ref 26.0–34.0)
MCHC: 35.9 g/dL (ref 30.0–36.0)
MCHC: 35.1 g/dL (ref 30.0–36.0)
MCV: 81.1 fL (ref 80.0–100.0)
MCV: 82.9 fL (ref 80.0–100.0)
Platelets: 30 10*3/uL — ABNORMAL LOW (ref 150–400)
Platelets: 93 10*3/uL — ABNORMAL LOW (ref 150–400)
RBC: 3.02 MIL/uL — ABNORMAL LOW (ref 4.22–5.81)
RBC: 2.99 MIL/uL — ABNORMAL LOW (ref 4.22–5.81)
RDW: 16.3 % — ABNORMAL HIGH (ref 11.5–15.5)
RDW: 16.8 % — ABNORMAL HIGH (ref 11.5–15.5)
WBC: 3.9 10*3/uL — ABNORMAL LOW (ref 4.0–10.5)
WBC: 4.9 10*3/uL (ref 4.0–10.5)
nRBC: 0 % (ref 0.0–0.2)
nRBC: 0 % (ref 0.0–0.2)

## 2022-06-20 LAB — DIC (DISSEMINATED INTRAVASCULAR COAGULATION)PANEL
D-Dimer, Quant: >20 ug/mL-FEU — ABNORMAL HIGH (ref 0.00–0.50)
Fibrinogen: 325 mg/dL (ref 210–475)
INR: 1.6 — ABNORMAL HIGH (ref 0.8–1.2)
Platelets: 31 10*3/uL — ABNORMAL LOW (ref 150–400)
Prothrombin Time: 18.8 seconds — ABNORMAL HIGH (ref 11.4–15.2)
Smear Review: NONE SEEN
aPTT: 30 seconds (ref 24–36)

## 2022-06-20 LAB — CBC WITH DIFFERENTIAL/PLATELET
Abs Immature Granulocytes: 0.9 10*3/uL — ABNORMAL HIGH (ref 0.00–0.07)
Basophils Absolute: 0 10*3/uL (ref 0.0–0.1)
Basophils Relative: 0 %
Eosinophils Absolute: 0 10*3/uL (ref 0.0–0.5)
Eosinophils Relative: 0 %
HCT: 21 % — ABNORMAL LOW (ref 39.0–52.0)
Hemoglobin: 6.9 g/dL — CL (ref 13.0–17.0)
Lymphocytes Relative: 5 %
Lymphs Abs: 0.2 10*3/uL — ABNORMAL LOW (ref 0.7–4.0)
MCH: 29 pg (ref 26.0–34.0)
MCHC: 32.9 g/dL (ref 30.0–36.0)
MCV: 88.2 fL (ref 80.0–100.0)
Metamyelocytes Relative: 18 %
Monocytes Absolute: 0 10*3/uL — ABNORMAL LOW (ref 0.1–1.0)
Monocytes Relative: 0 %
Myelocytes: 9 %
Neutro Abs: 2.3 10*3/uL (ref 1.7–7.7)
Neutrophils Relative %: 68 %
Platelets: 14 10*3/uL — CL (ref 150–400)
RBC: 2.38 MIL/uL — ABNORMAL LOW (ref 4.22–5.81)
RDW: 17 % — ABNORMAL HIGH (ref 11.5–15.5)
WBC: 3.4 10*3/uL — ABNORMAL LOW (ref 4.0–10.5)
nRBC: 0 % (ref 0.0–0.2)
nRBC: 1 /100 WBC — ABNORMAL HIGH

## 2022-06-20 LAB — FERRITIN: Ferritin: >7500 ng/mL — ABNORMAL HIGH (ref 24–336)

## 2022-06-20 LAB — FOLATE: Folate: 8.4 ng/mL (ref >5.9)

## 2022-06-20 LAB — IRON AND TIBC
Iron: 56 ug/dL (ref 45–182)
Saturation Ratios: 32 % (ref 17.9–39.5)
TIBC: 175 ug/dL — ABNORMAL LOW (ref 250–450)
UIBC: 119 ug/dL

## 2022-06-20 LAB — PROTIME-INR
INR: 1.5 — ABNORMAL HIGH (ref 0.8–1.2)
Prothrombin Time: 18.4 seconds — ABNORMAL HIGH (ref 11.4–15.2)

## 2022-06-20 LAB — VITAMIN B12: Vitamin B-12: 392 pg/mL (ref 180–914)

## 2022-06-20 LAB — LACTATE DEHYDROGENASE: LDH: 1395 U/L — ABNORMAL HIGH (ref 98–192)

## 2022-06-20 SURGERY — CYSTOSCOPY
Anesthesia: General

## 2022-06-20 SURGERY — CANCELLED PROCEDURE

## 2022-06-20 MED ORDER — IOHEXOL 350 MG/ML SOLN
75.0000 mL | Freq: Once | INTRAVENOUS | Status: AC | PRN
  Administered 2022-06-20: 75 mL via INTRAVENOUS

## 2022-06-20 MED ORDER — PHENYLEPHRINE HCL (PRESSORS) 10 MG/ML IV SOLN
INTRAVENOUS | Status: DC | PRN
  Administered 2022-06-20: 240 ug via INTRAVENOUS
  Administered 2022-06-20: 160 ug via INTRAVENOUS
  Administered 2022-06-20: 240 ug via INTRAVENOUS
  Administered 2022-06-20: 80 ug via INTRAVENOUS

## 2022-06-20 MED ORDER — DEXAMETHASONE SODIUM PHOSPHATE 10 MG/ML IJ SOLN
INTRAMUSCULAR | Status: DC | PRN
  Administered 2022-06-20: 10 mg via INTRAVENOUS

## 2022-06-20 MED ORDER — CHLORHEXIDINE GLUCONATE 0.12 % MT SOLN
15.0000 mL | Freq: Once | OROMUCOSAL | Status: AC
  Administered 2022-06-20: 15 mL via OROMUCOSAL

## 2022-06-20 MED ORDER — SODIUM CHLORIDE 0.9 % IV SOLN
2.0000 g | Freq: Once | INTRAVENOUS | Status: AC
  Administered 2022-06-20: 2 g via INTRAVENOUS
  Filled 2022-06-20: qty 20

## 2022-06-20 MED ORDER — ORAL CARE MOUTH RINSE: 15.0000 mL | Freq: Once | OROMUCOSAL | Status: AC

## 2022-06-20 MED ORDER — 0.9 % SODIUM CHLORIDE (POUR BTL) OPTIME
TOPICAL | Status: DC | PRN
  Administered 2022-06-20: 1000 mL

## 2022-06-20 MED ORDER — SUGAMMADEX SODIUM 200 MG/2ML IV SOLN
INTRAVENOUS | Status: DC | PRN
  Administered 2022-06-20: 200 mg via INTRAVENOUS

## 2022-06-20 MED ORDER — ROCURONIUM BROMIDE 10 MG/ML (PF) SYRINGE
PREFILLED_SYRINGE | INTRAVENOUS | Status: AC
  Filled 2022-06-20: qty 20

## 2022-06-20 MED ORDER — SUCCINYLCHOLINE CHLORIDE 200 MG/10ML IV SOSY
PREFILLED_SYRINGE | INTRAVENOUS | Status: AC
  Filled 2022-06-20: qty 30

## 2022-06-20 MED ORDER — FENTANYL CITRATE (PF) 250 MCG/5ML IJ SOLN
INTRAMUSCULAR | Status: AC
  Filled 2022-06-20: qty 5

## 2022-06-20 MED ORDER — SODIUM CHLORIDE 0.9 % IV SOLN
INTRAVENOUS | Status: AC
  Filled 2022-06-20: qty 20

## 2022-06-20 MED ORDER — ROCURONIUM 10MG/ML (10ML) SYRINGE FOR MEDFUSION PUMP - OPTIME
INTRAVENOUS | Status: DC | PRN
  Administered 2022-06-20: 30 mg via INTRAVENOUS

## 2022-06-20 MED ORDER — DEXAMETHASONE SODIUM PHOSPHATE 10 MG/ML IJ SOLN
INTRAMUSCULAR | Status: AC
  Filled 2022-06-20: qty 1

## 2022-06-20 MED ORDER — PROPOFOL 10 MG/ML IV BOLUS
INTRAVENOUS | Status: AC
  Filled 2022-06-20: qty 20

## 2022-06-20 MED ORDER — FENTANYL CITRATE (PF) 100 MCG/2ML IJ SOLN: 25.0000 ug | INTRAMUSCULAR | Status: DC | PRN

## 2022-06-20 MED ORDER — STERILE WATER FOR IRRIGATION IR SOLN
Status: DC | PRN
  Administered 2022-06-20: 3000 mL

## 2022-06-20 MED ORDER — LACTATED RINGERS IV SOLN: INTRAVENOUS | Status: DC

## 2022-06-20 MED ORDER — FENTANYL CITRATE (PF) 250 MCG/5ML IJ SOLN
INTRAMUSCULAR | Status: DC | PRN
  Administered 2022-06-20 (×2): 50 ug via INTRAVENOUS

## 2022-06-20 MED ORDER — ONDANSETRON HCL 4 MG/2ML IJ SOLN
INTRAMUSCULAR | Status: AC
  Filled 2022-06-20: qty 4

## 2022-06-20 MED ORDER — ACETAMINOPHEN 10 MG/ML IV SOLN: 1000.0000 mg | Freq: Once | INTRAVENOUS | Status: DC | PRN

## 2022-06-20 MED ORDER — SUCCINYLCHOLINE 20MG/ML (10ML) SYRINGE FOR MEDFUSION PUMP - OPTIME
INTRAMUSCULAR | Status: DC | PRN
  Administered 2022-06-20: 100 mg via INTRAVENOUS

## 2022-06-20 MED ORDER — MIDAZOLAM HCL 2 MG/2ML IJ SOLN
INTRAMUSCULAR | Status: AC
  Filled 2022-06-20: qty 2

## 2022-06-20 MED ORDER — SODIUM CHLORIDE 0.9% IV SOLUTION: Freq: Once | INTRAVENOUS | Status: AC

## 2022-06-20 MED ORDER — MIDAZOLAM HCL 2 MG/2ML IJ SOLN
INTRAMUSCULAR | Status: DC | PRN
  Administered 2022-06-20: 1 mg via INTRAVENOUS

## 2022-06-20 MED ORDER — ONDANSETRON HCL 4 MG/2ML IJ SOLN
INTRAMUSCULAR | Status: DC | PRN
  Administered 2022-06-20: 4 mg via INTRAVENOUS

## 2022-06-20 MED ORDER — PROPOFOL 10 MG/ML IV BOLUS
INTRAVENOUS | Status: DC | PRN
  Administered 2022-06-20: 80 mg via INTRAVENOUS

## 2022-06-20 MED ORDER — LIDOCAINE HCL URETHRAL/MUCOSAL 2 % EX GEL
1.0000 | Freq: Once | CUTANEOUS | Status: AC
  Administered 2022-06-20: 1 via URETHRAL
  Filled 2022-06-20: qty 6

## 2022-06-20 MED ORDER — LIDOCAINE HCL (CARDIAC) PF 100 MG/5ML IV SOSY
PREFILLED_SYRINGE | INTRAVENOUS | Status: DC | PRN
  Administered 2022-06-20: 40 mg via INTRAVENOUS

## 2022-06-20 SURGICAL SUPPLY — 33 items
BAG URINE DRAIN 2000ML AR STRL (UROLOGICAL SUPPLIES) ×1 IMPLANT
BAG URO CATCHER STRL LF (MISCELLANEOUS) ×1 IMPLANT
CATH FOLEY 2WAY SLVR  5CC 16FR (CATHETERS)
CATH FOLEY 2WAY SLVR 5CC 16FR (CATHETERS) IMPLANT
CATH URETL OPEN END 6FR 70 (CATHETERS) IMPLANT
GLOVE BIOGEL M STRL SZ7.5 (GLOVE) IMPLANT
GLOVE BIOGEL PI IND STRL 7.5 (GLOVE) IMPLANT
GLOVE INDICATOR 6.5 STRL GRN (GLOVE) IMPLANT
GLOVE SURG SS PI 8.0 STRL IVOR (GLOVE) ×1 IMPLANT
GOWN STRL REUS W/ TWL LRG LVL3 (GOWN DISPOSABLE) ×1 IMPLANT
GOWN STRL REUS W/ TWL XL LVL3 (GOWN DISPOSABLE) ×1 IMPLANT
GOWN STRL REUS W/TWL LRG LVL3 (GOWN DISPOSABLE) ×1
GOWN STRL REUS W/TWL XL LVL3 (GOWN DISPOSABLE) ×1
GUIDEWIRE ANG ZIPWIRE 038X150 (WIRE) IMPLANT
GUIDEWIRE STR DUAL SENSOR (WIRE) IMPLANT
IV NS IRRIG 3000ML ARTHROMATIC (IV SOLUTION) IMPLANT
KIT TURNOVER KIT B (KITS) ×1 IMPLANT
LOOP CUT BIPOLAR 24F LRG (ELECTROSURGICAL) IMPLANT
MANIFOLD NEPTUNE II (INSTRUMENTS) IMPLANT
NS IRRIG 1000ML POUR BTL (IV SOLUTION) ×1 IMPLANT
PACK CYSTO (CUSTOM PROCEDURE TRAY) ×1 IMPLANT
PAD ARMBOARD 7.5X6 YLW CONV (MISCELLANEOUS) ×1 IMPLANT
SENSORWIRE 0.038 NOT ANGLED (WIRE) ×1
STENT URET 6FRX24 CONTOUR (STENTS) IMPLANT
STENT URET 6FRX26 CONTOUR (STENTS) IMPLANT
SYPHON OMNI JUG (MISCELLANEOUS) ×1 IMPLANT
SYR 10ML LL (SYRINGE) IMPLANT
SYR TOOMEY IRRIG 70ML (MISCELLANEOUS) ×1
SYRINGE TOOMEY IRRIG 70ML (MISCELLANEOUS) IMPLANT
TOWEL GREEN STERILE FF (TOWEL DISPOSABLE) ×1 IMPLANT
TUBE CONNECTING 12X1/4 (SUCTIONS) IMPLANT
WATER STERILE IRR 3000ML UROMA (IV SOLUTION) ×1 IMPLANT
WIRE SENSOR 0.038 NOT ANGLED (WIRE) IMPLANT

## 2022-06-20 NOTE — Transfer of Care (Signed)
Immediate Anesthesia Transfer of Care Note  Patient: Chino E Tindel  Procedure(s) Performed: CYSTOSCOPY WITH CLOT EVCAUATION  Patient Location: PACU  Anesthesia Type:General  Level of Consciousness: sedated  Airway & Oxygen Therapy: Patient connected to nasal cannula oxygen  Post-op Assessment: Report given to RN and Post -op Vital signs reviewed and stable  Post vital signs: Reviewed and stable  Last Vitals:  Vitals Value Taken Time  BP 119/59   Temp 98.2   Pulse 83 06/15/2022 2134  Resp 18 07/03/2022 2134  SpO2 92 % 07/10/2022 2134  Vitals shown include unvalidated device data.  Last Pain:  Vitals:   07/05/2022 1653  TempSrc: Oral  PainSc: 0-No pain      Patients Stated Pain Goal: 2 (97/94/99 7182)  Complications: No notable events documented.

## 2022-06-20 NOTE — Progress Notes (Signed)
Patient ID: Devin Mercado, male   DOB: April 05, 1951, 71 y.o.   MRN: [id number]  Day of Surgery Subjective: Pt developed clot retention overnight requiring catheter placement.  16 Fr Foley placed with > 600 cc of dark, red urine returned.  He is currently comfortable.  Objective: Vital signs in last 24 hours: Temp:  [98.7 F (37.1 C)-100.4 F (38 C)] 100.4 F (38 C) (12/08 0356) Pulse Rate:  [78-105] 103 (12/08 0356) Resp:  [16-20] 16 (12/08 0356) BP: (98-132)/(58-71) 113/68 (12/08 0356) SpO2:  [96 %-99 %] 97 % (12/08 0356)  Intake/Output from previous day: 12/07 0701 - 12/08 0700 In: 1998.4 [I.V.:1012.4; Blood:916] Out: 9937 [JIRCV:8938] Intake/Output this shift: No intake/output data recorded.  Physical Exam:  General: Alert and oriented GU: Urine draining well but still dark reddish color.  Lab Results: Recent Labs    06/19/22 0555 06/19/22 2047 06/18/2022 0312  HGB 7.2* 6.9* 8.8*  HCT 20.7* 21.0* 24.5*      Latest Ref Rng & Units 06/16/2022    3:12 AM 06/19/2022    8:47 PM 06/19/2022    5:55 AM  CBC  WBC 4.0 - 10.5 K/uL 3.9  3.4  3.3   Hemoglobin 13.0 - 17.0 g/dL 8.8  6.9  C 7.2   Hematocrit 39.0 - 52.0 % 24.5  21.0  20.7   Platelets 150 - 400 K/uL 150 - 400 K/uL '31    30  14  '$ C 21     C Corrected result   Multiple values from one day are sorted in reverse-chronological order     BMET Recent Labs    06/19/22 0555 07/11/2022 0312  NA 128* 129*  K 4.3 4.2  CL 100 97*  CO2 18* 19*  GLUCOSE 116* 115*  BUN 25* 29*  CREATININE 1.14 1.27*  CALCIUM 7.0* 6.7*     Studies/Results: DG Abd 1 View  Result Date: 06/19/2022 CLINICAL DATA:  Distension EXAM: ABDOMEN - 1 VIEW COMPARISON:  CT 09/14/2014 FINDINGS: Mild diffuse increased small and large bowel gas. Bilateral hip replacements. Pelvic phleboliths. Amorphous density in the left upper quadrant could represent something in the enteral stream. No definite radiopaque calculi. IMPRESSION: Mild diffuse increased  small and large bowel gas suggestive of mild ileus. Electronically Signed   By: Devin Mercado M.D.   On: 06/19/2022 23:28    Assessment/Plan: 1) CRPC: On ADT with progressive metastatic prostate cancer s/p treatment with docetaxel, Lutetium-177, and radium-223 all of which are contributory to pancytopenia.  Appreciate Dr. Grier Mercado involvement. 2) Hematuria/radiation cystitis:  This has been previously confirmed earlier in the fall to be due to radiation cystitis and he has been tentatively scheduled for hyperbaric oxygen therapy later this month.  However, he continues to have ongoing bleeding.  Earlier this week, this was felt to be old blood but there may be a component of active bleeding with Hgb dropping.  He is s/p 2 units of PRBCs last night with an appropriate increased in Hgb.  He also received platelet transfusion but his platelets remains low at 31,000.  It would be appropriate to consider cystoscopy in the OR with fulguration of bleeding sites and clot evacuation as last resort but would need to have platelet count > 50,000 for this to likely be useful.  Will discuss with primary team this morning and could consider this later today or tomorrow after further platelet transfusion.  Will discuss whether TEE should be done first.  In the meantime, continue transfusion as necessary and  again appreciate Dr. Grier Mercado input and advice here.     LOS: 8 days   Devin Mercado 06/16/2022, 7:14 AM

## 2022-06-20 NOTE — Progress Notes (Signed)
No UOP since 2000. Bladderscan reading is likely not accurate - it is reading @ 37cc. Previously pt was voiding dark red blood. Pt denies feeling like his bladder is full but is noticeably restless and uncomfortable. Urology paged d/t pt having low platelets and RN uncomfortable w/ potential repeated straight caths for retention. Urology PA ordered for foley to be placed w/PRN irrigation. 63f foley placed w/urojet with no issues. Minimal bloody urine return upon insertion. Foley irrigated and multiple clots aspirated. 675cc dark bloody urine drained from foley with multiple clot aspirations to keep urine flowing. Urine continues to slowly drain and is dark watermelon color with no visible clots at this time. Will reassess frequently to ensure foley is draining.

## 2022-06-20 NOTE — Anesthesia Procedure Notes (Signed)
Procedure Name: Intubation Date/Time: 06/14/2022 8:32 PM  Performed by: Valetta Fuller, CRNAPre-anesthesia Checklist: Patient identified, Emergency Drugs available, Suction available and Patient being monitored Patient Re-evaluated:Patient Re-evaluated prior to induction Oxygen Delivery Method: Circle system utilized Preoxygenation: Pre-oxygenation with 100% oxygen Induction Type: IV induction and Rapid sequence Laryngoscope Size: Miller and 2 Grade View: Grade II Tube size: 7.5 mm Number of attempts: 2 Airway Equipment and Method: Stylet Placement Confirmation: ETT inserted through vocal cords under direct vision, positive ETCO2 and breath sounds checked- equal and bilateral Secured at: 23 cm Tube secured with: Tape Dental Injury: Injury to lip  Comments: Small laceration on upper lip. Lubricant applied.

## 2022-06-20 NOTE — Anesthesia Preprocedure Evaluation (Addendum)
Anesthesia Evaluation  Patient identified by MRN, date of birth, ID band Patient awake    Reviewed: Allergy & Precautions, NPO status , Patient's Chart, lab work & pertinent test results  Airway Mallampati: II  TM Distance: >3 FB Neck ROM: Full    Dental no notable dental hx.    Pulmonary neg pulmonary ROS   Pulmonary exam normal        Cardiovascular hypertension, Pt. on medications + dysrhythmias Atrial Fibrillation + pacemaker  Rhythm:Regular Rate:Normal  ECHO 12/23:  1. Difficult acoustic windows limit study Would recomm limited echo with  Definity to confirm LVEF and wall motion. There appears to be hypokinesis  of the mid/distal lateral, mid/distal septal, distal inferior walls and  apical akinesis.. Left ventricular   ejection fraction, by estimation, is 30%. The left ventricle has  moderately decreased function.   2. Right ventricular systolic function is normal. The right ventricular  size is normal. There is normal pulmonary artery systolic pressure.   3. The mitral valve is normal in structure. Trivial mitral valve  regurgitation.   4. The aortic valve is normal in structure. Aortic valve regurgitation is  not visualized.   5. There is mild dilatation of the ascending aorta, measuring 42 mm.   6. The inferior vena cava is normal in size with greater than 50%  respiratory variability, suggesting right atrial pressure of 3 mmHg.      Neuro/Psych CVA  negative psych ROS   GI/Hepatic   Endo/Other  negative endocrine ROS    Renal/GU   negative genitourinary   Musculoskeletal  (+) Arthritis , Osteoarthritis,    Abdominal Normal abdominal exam  (+)   Peds  Hematology  (+) Blood dyscrasia, anemia Lab Results      Component                Value               Date                      WBC                      3.9 (L)             06/14/2022                HGB                      8.8 (L)              06/22/2022                HCT                      24.5 (L)            07/03/2022                MCV                      81.1                07/05/2022                PLT                      31 (L)  06/28/2022                PLT                      30 (L)              07/04/2022             Lab Results      Component                Value               Date                      NA                       129 (L)             06/15/2022                K                        4.2                 07/04/2022                CO2                      19 (L)              06/15/2022                GLUCOSE                  115 (H)             06/18/2022                BUN                      29 (H)              07/04/2022                CREATININE               1.27 (H)            07/06/2022                CALCIUM                  6.7 (L)             07/11/2022                GFRNONAA                 60 (L)              07/13/2022              Anesthesia Other Findings Prostate Ca with bone mets and bone marrow suppression pancytopenia  Reproductive/Obstetrics                             Anesthesia Physical Anesthesia Plan  ASA: 4  Anesthesia Plan: General   Post-op Pain Management:    Induction: Intravenous  PONV Risk Score and Plan: 2 and  Ondansetron, Dexamethasone and Treatment may vary due to age or medical condition  Airway Management Planned: Mask and LMA  Additional Equipment: None  Intra-op Plan:   Post-operative Plan: Extubation in OR  Informed Consent: I have reviewed the patients History and Physical, chart, labs and discussed the procedure including the risks, benefits and alternatives for the proposed anesthesia with the patient or authorized representative who has indicated his/her understanding and acceptance.     Dental advisory given  Plan Discussed with: CRNA  Anesthesia Plan Comments:        Anesthesia Quick  Evaluation

## 2022-06-20 NOTE — Op Note (Signed)
Preoperative diagnosis: Gross hematuria due to radiation cystitis  Postoperative diagnosis: Gross hematuria due to radiation cystitis  Procedures: 1.  Cystoscopy 2.  Clot evacuation 3.  Fulguration of bleeding sites within bladder and urethra  Surgeon: Pryor Curia MD  Anesthesia: General  Complication: None  EBL: Minimal  Specimens: None  Indication: Devin Mercado is a 71 year old gentleman with metastatic prostate cancer who has pancytopenia and radiation cystitis with hematuria requiring blood transfusion.  He has had ongoing bleeding and thrombocytopenia.  He received multiple platelet transfusions today to get his platelet count to 93,000.  It was felt that it would be beneficial to take him to the operating room for cystoscopy and clot evacuation and fulguration of any bleeding sites.  The potential risks, complications, and expected recovery process were discussed in detail.  Informed consent was obtained.  Description of procedure: The patient was taken the operating room and a general anesthetic was administered.  He was given preoperative antibiotics, placed in the dorsolithotomy position, and prepped and draped in usual sterile fashion.  Next the 49 French resectoscope was used with the visual obturator.  The bladder was entered and clot was evacuated from the bladder.  Once all clot was evacuated, bladder was examined.  There were no active bleeding sites within the majority of the bladder although there were erythematous areas that looked prone to bleeding.  These were each cauterized.  At the bladder neck and just with in the proximal urethra, there were noted to be multiple areas that were actively bleeding.  These were fulgurated with bipolar cautery.  Once all active bleeding sites were appropriately fulgurated, the bladder was emptied and the resectoscope was removed.  The patient tolerated the procedure well without complications.  He was able to be awakened and  transferred to recovery unit in satisfactory condition.

## 2022-06-20 NOTE — TOC Progression Note (Signed)
Transition of Care [facility]) - Progression Note    Patient Details  Name: ALDON HENGST MRN: [id number] Date of Birth: [birth year]  [facility] of [facility]) CM/SW Contact  Pollie Friar, RN Phone Number: 06/27/2022, 1:27 PM  Clinical Narrative:    Pt not medically ready for CIR. CIR may have to re-submit for insurance next week if pt closer to being ready for rehab.  TOC following.   Expected Discharge Plan: IP Rehab Facility Barriers to Discharge: Continued Medical Work up  Expected Discharge Plan and Services Expected Discharge Plan: [place]   Discharge Planning Services: CM Consult   Living arrangements for the past 2 months: Single Family Home                                       Social Determinants of Health (SDOH) Interventions    Readmission Risk Interventions    05/08/2022    9:44 AM  Readmission Risk Prevention Plan  Post Dischage Appt Complete  Medication Screening Complete  Transportation Screening Complete

## 2022-06-20 NOTE — Plan of Care (Signed)
  Problem: Education: Goal: Knowledge of General Education information will improve Description: Including pain rating scale, medication(s)/side effects and non-pharmacologic comfort measures Outcome: Not Progressing   Problem: Health Behavior/Discharge Planning: Goal: Ability to manage health-related needs will improve Outcome: Not Progressing   Problem: Clinical Measurements: Goal: Ability to maintain clinical measurements within normal limits will improve Outcome: Not Progressing Goal: Will remain free from infection Outcome: Not Progressing Goal: Diagnostic test results will improve Outcome: Not Progressing Goal: Respiratory complications will improve Outcome: Not Progressing Goal: Cardiovascular complication will be avoided Outcome: Not Progressing   Problem: Activity: Goal: Risk for activity intolerance will decrease Outcome: Not Progressing   Problem: Nutrition: Goal: Adequate nutrition will be maintained Outcome: Not Progressing   Problem: Coping: Goal: Level of anxiety will decrease Outcome: Not Progressing   Problem: Elimination: Goal: Will not experience complications related to bowel motility Outcome: Not Progressing Goal: Will not experience complications related to urinary retention Outcome: Not Progressing   Problem: Pain Managment: Goal: General experience of comfort will improve Outcome: Not Progressing   Problem: Safety: Goal: Ability to remain free from injury will improve Outcome: Not Progressing   Problem: Skin Integrity: Goal: Risk for impaired skin integrity will decrease Outcome: Not Progressing   Problem: Education: Goal: Knowledge of disease or condition will improve Outcome: Not Progressing Goal: Knowledge of secondary prevention will improve (MUST DOCUMENT ALL) Outcome: Not Progressing

## 2022-06-20 NOTE — Progress Notes (Signed)
Physical Therapy Treatment Patient Details Name: Devin Mercado MRN: [id number] DOB: April 08, 1951 Today's Date: 06/15/2022   History of Present Illness This is a 71 year old male presents with hematuria. 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 PHMx: prostate cancer with bone mets, anemia, pancytopenia. L Reverse TSA April 2023    PT Comments    Pt greeted supine in bed and agreeable to session, however limited by pain and weakness. Pt needing increased assist this date to come to sitting EOB, however pt able to maintain sitting balance without assist. Pt demonstrating short bout of gait in room with mod assist needed throughout for negotiation in room around obstacles as pt with poor problem solving. Pt with max c/o fatigue and "over doing it" with pt needing to sit, pt needing max cues to side step and steep backwards to center self at chair before coming to sit, pt with significant difficulty motor planning and execution side steps and retro steps. Current plan remains appropriate to address deficits and maximize functional independence and decrease caregiver burden. Pt continues to benefit from skilled PT services to progress toward functional mobility goals.     Recommendations for follow up therapy are one component of a multi-disciplinary discharge planning process, led by the attending physician.  Recommendations may be updated based on patient status, additional functional criteria and insurance authorization.  Follow Up Recommendations  Acute inpatient rehab (3hours/day)     Assistance Recommended at Discharge Frequent or constant Supervision/Assistance  Patient can return home with the following A lot of help with walking and/or transfers;A lot of help with bathing/dressing/bathroom;Assistance with cooking/housework;Assist for transportation;Help with stairs or ramp for  entrance   Equipment Recommendations  None recommended by PT    Recommendations for Other Services       Precautions / Restrictions Precautions Precautions: Fall Precaution Comments: L TSA 10/2021 - very painful Restrictions Weight Bearing Restrictions: No     Mobility  Bed Mobility Overal bed mobility: Needs Assistance Bed Mobility: Supine to Sit     Supine to sit: Mod assist     General bed mobility comments: mod assist to bring LEs to and off EOB and to elevate trunk    Transfers Overall transfer level: Needs assistance Equipment used: Rolling walker (2 wheels) Transfers: Sit to/from Stand Sit to Stand: Min assist           General transfer comment: min assist to power up and for cues for hand placement    Ambulation/Gait Ambulation/Gait assistance: Max assist, Mod assist Gait Distance (Feet): 6 Feet Assistive device: Rolling walker (2 wheels) Gait Pattern/deviations: Step-to pattern, Decreased stride length       General Gait Details: very slow step to gait with cues throughout for RW proximity and for navigation as pt unable to negotiate in room without hands on assist, max assist for pt to side step and step back to sit in chair, distance limited to pt tolerance as pt with c/o max fatigue and needing to sit   Stairs             Wheelchair Mobility    Modified Rankin (Stroke Patients Only) Modified Rankin (Stroke Patients Only) Pre-Morbid Rankin Score: No symptoms Modified Rankin: Moderately severe disability     Balance Overall balance assessment: Needs assistance Sitting-balance support: Feet supported, No upper extremity supported Sitting balance-Leahy Scale: Fair     Standing balance  support: Bilateral upper extremity supported, During functional activity, Reliant on assistive device for balance Standing balance-Leahy Scale: Poor Standing balance comment: reliance on RW for support                            Cognition  Arousal/Alertness: Awake/alert Behavior During Therapy: Flat affect Overall Cognitive Status: Impaired/Different from baseline Area of Impairment: Following commands, Problem solving, Awareness, Safety/judgement                       Following Commands: Follows one step commands consistently Safety/Judgement: Decreased awareness of deficits, Decreased awareness of safety Awareness: Emergent Problem Solving: Slow processing, Difficulty sequencing General Comments: Pt following most single step commands, required hands on assist for problem solving exercises, wife reports improved cognition        Exercises      General Comments General comments (skin integrity, edema, etc.): daughter and spouse present and supportive.      Pertinent Vitals/Pain Pain Assessment Pain Assessment: Faces Faces Pain Scale: Hurts worst Pain Location: general "everywhere" Pain Descriptors / Indicators: Moaning, Sore Pain Intervention(s): Monitored during session, Limited activity within patient's tolerance    Home Living                          Prior Function            PT Goals (current goals can now be found in the care plan section) Acute Rehab PT Goals PT Goal Formulation: With patient/family Time For Goal Achievement: 06/29/22    Frequency    Min 4X/week      PT Plan      Co-evaluation              AM-PAC PT "6 Clicks" Mobility   Outcome Measure  Help needed turning from your back to your side while in a flat bed without using bedrails?: A Little Help needed moving from lying on your back to sitting on the side of a flat bed without using bedrails?: A Lot Help needed moving to and from a bed to a chair (including a wheelchair)?: A Little Help needed standing up from a chair using your arms (e.g., wheelchair or bedside chair)?: A Lot Help needed to walk in hospital room?: A Lot Help needed climbing 3-5 steps with a railing? : Total 6 Click Score:  13    End of Session Equipment Utilized During Treatment: Gait belt Activity Tolerance: Patient limited by fatigue;Patient limited by pain Patient left: in chair;with call bell/phone within reach;with family/visitor present;with nursing/sitter in room;with chair alarm set Nurse Communication: Mobility status PT Visit Diagnosis: Other abnormalities of gait and mobility (R26.89);Muscle weakness (generalized) (M62.81)     Time: 2025-4270 PT Time Calculation (min) (ACUTE ONLY): 25 min  Charges:  $Gait Training: 8-22 mins $Therapeutic Activity: 8-22 mins                     Devin Mercado R. PTA Acute Rehabilitation Services Office: [place] 07/09/2022, 10:14 AM

## 2022-06-20 NOTE — Progress Notes (Signed)
    [facility]ious [facility]   Reason for visit: Follow up on fever  Interval History: T max 100.4.    Physical Exam: Constitutional:  Vitals:   07/13/2022 1222 07/01/2022 1237  BP: 124/76 111/75  Pulse: 95 94  Resp: 19 17  Temp: 99.4 F (37.4 C) 100 F (37.8 C)  SpO2: 98% 98%   No changes from ID standpoint.  Karius test pending and I have a low suspicion it will be positive.    Dr. Tommy Medal is available over the weekend if needed, otherwise I will follow up on Monday

## 2022-06-20 NOTE — Procedures (Signed)
Marland Kitchen   HEMATOLOGY/ONCOLOGY INPATIENT PROGRESS NOTE  Date of Service: 06/19/2022  Patient Care Team: [name], MD as PCP - General (Family Medicine) [name], MD as PCP - Cardiology (Cardiology) [name], NP (Inactive) as Nurse Practitioner (Cardiology) [facility] as Consulting Physician [name], [name], MD as Consulting Physician (Medical Oncology)  CHIEF COMPLAINTS/PURPOSE OF CONSULTATION:  Evaluation of Pancytopenia  HISTORY OF PRESENTING ILLNESS:   Devin Mercado is a wonderful 71 y.o. male who has been referred to [place] by [name] for evaluation and management of pancytopenia. Patient's wife [name] is at bedside. 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  Patient has a family history of prostate cancer in his father had PSA screening at age 34 with a PSA of 8.15. Prostate cancer hx as noted below --08/2014: Underwent PSA screening at age 48 due to a +FH of prostate cancer (Father). PSA was 8.15 --09/05/14: TRUS Bx: Gleason 4+3=7 LLB 70% and 4+4=8 LLM, overall 5/12 cores all on left, high risk. --09/15/14: bone scan: tiny focus of uptake right 4th rib only. Prosthetic left hip. No metastases seen. CT NED --09/18/14: rib film showed old scar right 4th rib from prior fracture. --11/06/14: radical prostatectomy. Pathology: Gleason 4+5=9 adenocarcinoma bilaterally with positive margins on the left (extensive), +ECE, LVI (pT3a N0 R1) but no SVI. 0/7 nodes. Grade Group 5, high risk. 51 gram prostate, pattern 5 was 15% overall, acinar histology only, and <15% of gland involved by cancer. --12/22/14: PSA nadir 0.01 --02/16/15: PSA 0.04 --05/2015: Completed adjuvant XRT (Dr. Valere Dross) with some increased urinary incontinence, ED  issues. No ADT was given with RT. --05/2015: PSA 0.26 --08/31/15: PSA 0.89 --10/04/15: PSA 1.64, T 230, CT CAP and Bone Scan NED --11/21/15: PSA 2.74 Started on Lupron --02/13/16 PSA <0.01 T 12 --07/16/16 PSA < 0.01 Lupron held --01/26/17 PSA <0.01 T 63 --03/18/17 PSA <0.04 T 93 --06/17/17 PSA 0.90 T 138 --10/07/17 PSA 6.55 restarted on Lupron --03/24/19 PSA 11.57, started on darolutamide --04/15/19: PSA 2.83 --05/26/19: PSA 1.18 --10/27/19 L1 SBRT PSA 2.43 --12/29/19: PSA 0.44 --03/26/20: PSA 1.65 --05/07/20: PSA 3.65 --05/17/20: radium #1 --07/03/20: PSA 10.83 --07/12/20: Radium #2 --07/26/20: PSA17.89 --08/09/20: Radium #3 --09/06/20: Radium #4 --10/04/20: Radium #5 --10/18/20: PSA 36.18 --11/01/20: Radium #6 --01/07/21: C1 docetaxel; PSA 138. Bone only mCRPC. Bone scan: Redemonstrated multiple areas of increased radiotracer uptake. Reference lesions include bilateral anterior and posterior ribs, right superior iliac bone, L1 vertebral body. --01/28/21: C2 docetaxel; PSA 136.39 --02/11/21: PSA 94.28 --02/27/21: PSA 77.87; C3 docetaxel -- 03/20/21: PSA 68.22; C4 docetaxel, completed 8 cycles through 07/04/2021 PSA decline 57 04/11/2021-->44 05/02/2021 but rise 50.7 05/27/2021 and 56.8 07/04/2021 --08/08/21 PSMA-Lu177 #1, 09/20/21 #2, 11/21/21 #3, PSA 12.8 11/19/21 nadir --PSA drop to 17.6 12/05/21 --PROMISE germline testing recommended 12/05/21  Patient apparently had his last Pluvicto fifth cycle in early August 2023. He was last seen by his medical oncology team at [facility] on 05/27/2022 for consideration of cabazitaxel but was noted to have severe anemia and thrombocytopenia and this was not an option currently. He received 2 units of PRBCs and [employer]. Patient presented to [place] on 11/30 with hematuria.  Patient was previously seen by urology [name] for hematuria and underwent bladder irrigation.  Subsequently at home started passing  clots and was unable to  fully empty his bladder leading to hospitalization.  Foley's catheter placed and was placed under continuous bladder irrigation.  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  06/14/2022 the patient developed right upper extremity ataxia visual field defects neurology was consulted.  CT head shows concern for embolic CVA in the right parietal lobe left occipital lobe.  MRI could not be done due to presence of pacemaker.  Pacemaker interrogation was notable for presence of newly noted A-fib.  Patient was transferred to [facility] for further neurologic evaluation. TTE with LV ejection fraction of 30%. TEE is being planned but has been held pending platelet count of less than 50k  Patient also has had issues with fever of unknown origin with significantly elevated procalcitonin levels which are gradually coming down.  Infectious diseases following and Karius test was sent out on 06/18/2022.  Hematology was consulted to weigh in on the patient's pancytopenia.   I discussed possible considerations with the patient and his wife [name] at bedside. Patient had developed pancytopenia especially with significant anemia and thrombocytopenia since his Pluvicto treatment.  He also has previously had radioactive radium treatments for bone only metastatic disease with no significant visceral involvement as well as previous docetaxel chemotherapy.  He recently has had significant progression of his metastatic prostate cancer with PSA levels going from 17.6 on 12/05/2021 up to 940 on 06/04/2022 suggesting significant progression of his castrate resistant prostate cancer. He has had significant acute blood loss anemia from his significant ongoing hematuria related to radiation cystitis with possible active bleeding  spots. There is also concern that the patient might have sepsis with elevated procalcitonin levels which could suppress his bone marrow. We discussed that his platelets could be low of additionally from consumption of his bleeding as well as from dilution from PRBC transfusions.  Cannot rule out DIC from the malignancy as well as significant bleeding.  INTERVAL HISTORY  Devin Mercado was seen in hematologic f/u today. Patient was seen with 2 son at bedside and his wife on the phone. We discussed workup results in details and goals of care. Devin Plessinger has been having ongoing hematuria and needed indwelling catheter placement again and is receiving platelets for cystoscopy by Dr [name] today.  MEDICAL HISTORY:  Past Medical History:  Diagnosis Date   Arthritis    OA AND PAIN LEFT HIP   CHB (complete heart block) ([place])    a. MDT dual chamber pacemaker   HTN (hypertension)    Hyperlipidemia    Presence of permanent cardiac pacemaker    2000   Prostate cancer ([place]) 09/2014    SURGICAL HISTORY: Past Surgical History:  Procedure Laterality Date   ADENOIDECTOMY AS A CHILD     COLONOSCOPY WITH PROPOFOL N/A 12/30/2017   Procedure: COLONOSCOPY WITH PROPOFOL;  Surgeon: Ladene Artist, MD;  Location: [facility];  Service: Endoscopy;  Laterality: N/A;   JOINT REPLACEMENT     Right hip replacement Dr. Alvan Dame 01-05-18   LYMPHADENECTOMY Bilateral 11/06/2014   Procedure: PELVIC LYMPHADENECTOMY;  Surgeon: [name], MD;  Location: [facility];  Service: Urology;  Laterality: Bilateral;   PACEMAKER INSERTION  2000; 2012   MDT dual chamber pacemaker implanted 2000 with gen change 2012   POLYPECTOMY  12/30/2017   Procedure: POLYPECTOMY;  Surgeon: Ladene Artist, MD;  Location: [facility];  Service: Endoscopy;;   PROSTATE BIOPSY  09/05/14   REVERSE SHOULDER ARTHROPLASTY Left 10/15/2021   Procedure: REVERSE SHOULDER ARTHROPLASTY;  Surgeon: Justice Britain, MD;  Location: [facility];  Service: Orthopedics;   Laterality: Left;   ROBOT ASSISTED LAPAROSCOPIC RADICAL PROSTATECTOMY N/A 11/06/2014   Procedure: ROBOTIC ASSISTED LAPAROSCOPIC RADICAL PROSTATECTOMY LEVEL 2;  Surgeon: [name], MD;  Location: [facility];  Service: Urology;  Laterality: N/A;   TOTAL HIP ARTHROPLASTY Left 07/03/2014   Procedure: LEFT TOTAL HIP ARTHROPLASTY ANTERIOR APPROACH;  Surgeon: [name], MD;  Location: [facility];  Service: Orthopedics;  Laterality: Left;   TOTAL HIP ARTHROPLASTY Right 01/05/2018   Procedure: RIGHT TOTAL HIP ARTHROPLASTY ANTERIOR APPROACH;  Surgeon: Paralee Cancel, MD;  Location: [facility];  Service: Orthopedics;  Laterality: Right;  70 mins   WISDOM TEETH EXTRACTIONS      SOCIAL HISTORY: Social History   Socioeconomic History   Marital status: Married    Spouse name: Not on file   Number of children: 4   Years of education: Not on file   Highest education level: Not on file  Occupational History   Occupation: attorney  Tobacco Use   Smoking status: Never   Smokeless tobacco: Never  Vaping Use   Vaping Use: Never used  Substance and Sexual Activity   Alcohol use: Yes    Alcohol/week: 2.0 - 3.0 standard drinks of alcohol    Types: 2 - 3 Standard drinks or equivalent per week    Comment: 5 TO 7 DRINKS A WEEK stopped etoh use pending surgery    Drug use: No   Sexual activity: Yes  Other Topics Concern   Not on file  Social History Narrative   Married to Dr. [name] 1979. 4 kids (1 lost to auto accident-3 surviving), no grandkids. 1 daughter married in 2016 and works as Forensic psychologist in [place].       Retired Forensic psychologist       Hobbies: [place], vacation   Social Determinants of Radio broadcast assistant Strain: Not on file  Food Insecurity: No [place] (06/13/2022)   Hunger Vital Sign    Worried About Running Out of Food in the Last Year: Never true    [place] in the Last Year: Never true  Transportation Needs: No Transportation Needs (06/13/2022)   PRAPARE -  Hydrologist (Medical): No    Lack of Transportation (Non-Medical): No  Physical Activity: Not on file  Stress: Not on file  Social Connections: Not on file  Intimate Partner Violence: Not At Risk (06/13/2022)   Humiliation, Afraid, Rape, and Kick questionnaire    Fear of Current or Ex-Partner: No    Emotionally Abused: No    Physically Abused: No    Sexually Abused: No    FAMILY HISTORY: Family History  Problem Relation Age of Onset   Stroke Mother        multiple. 43.    Heart disease Mother        hx heart valve problem   Prostate cancer Father        12 of prostate cancer   Heart attack Neg Hx    Hypertension Neg Hx     ALLERGIES:  has No Known Allergies.  MEDICATIONS:  Current Facility-Administered Medications  Medication Dose Route Frequency Provider Last Rate Last Admin   0.9 %  sodium chloride infusion (Manually program via Guardrails  IV Fluids)   Intravenous Once [name], MD       0.9 %  sodium chloride infusion   Intravenous Continuous [name], MD   Stopped at 06/13/2022 0831   acetaminophen (TYLENOL) tablet 650 mg  650 mg Oral Q6H PRN [name], MD   650 mg at 06/19/22 1607   Or   acetaminophen (TYLENOL) suppository 650 mg  650 mg Rectal Q6H PRN [name], MD       amiodarone (PACERONE) tablet 200 mg  200 mg Oral BID [name], MD   200 mg at 07/06/2022 0959   atorvastatin (LIPITOR) tablet 20 mg  20 mg Oral Daily [name], MD   20 mg at 06/16/2022 3710   B-complex with vitamin C tablet 1 tablet  1 tablet Oral Daily [name], MD   1 tablet at 07/09/2022 0959   famotidine (PEPCID) tablet 20 mg  20 mg Oral Daily [name], MD   20 mg at 06/25/2022 0959   lidocaine (LIDODERM) 5 % 1 patch  1 patch Transdermal Q24H [name], MD   1 patch at 07/08/2022 0953   LORazepam (ATIVAN) injection 0.5 mg  0.5 mg Intravenous Once PRN [name], MD       morphine (PF) 2 MG/ML injection 2 mg  2 mg Intravenous  Q4H PRN [name], MD   2 mg at 06/26/2022 1556   oxyCODONE (Oxy IR/ROXICODONE) immediate release tablet 5 mg  5 mg Oral Q6H PRN [name], MD   5 mg at 06/15/2022 1327   predniSONE (DELTASONE) tablet 10 mg  10 mg Oral q morning [name], MD   10 mg at 06/17/2022 6269   senna-docusate (Senokot-S) tablet 1 tablet  1 tablet Oral QHS PRN [name], MD   1 tablet at 06/19/22 2247   sodium bicarbonate tablet 650 mg  650 mg Oral TID [name], MD   650 mg at 06/25/2022 1555   traMADol (ULTRAM) tablet 50 mg  50 mg Oral Q8H PRN [name], MD        REVIEW OF SYSTEMS:    10 Point review of Systems was done is negative except as noted above.  PHYSICAL EXAMINATION: ECOG PERFORMANCE STATUS: 3 - Symptomatic, >50% confined to bed  . Vitals:   06/15/2022 2200 07/13/2022 2220  BP: 127/87 128/69  Pulse: 86 85  Resp: 17 20  Temp:  98.4 F (36.9 C)  SpO2: 93% 97%   Filed Weights   05/27/2022 1716 06/11/2022 2359  Weight: 225 lb (102.1 kg) 227 lb 1.2 oz (103 kg)   .Body mass index is 30.8 kg/m.  GENERAL:alert, tired appearing and resting in bed SKIN: Petechiae/ecchymosis over extremities OROPHARYNX: MMM LYMPH:  no palpable lymphadenopathy in the cervical, axillary or inguinal regions LUNGS: clear to auscultation b/l with normal respiratory effort HEART:irregular ABDOMEN:  normoactive bowel sounds , non tender, not distended.foleys with red urine Extremity: b/l 2-3+ pedal edema PSYCH: alert & oriented x 3 with fluent speech  LABORATORY DATA:  I have reviewed the data as listed  .    Latest Ref Rng & Units 06/19/2022    5:02 PM 06/30/2022    3:12 AM 06/19/2022    8:47 PM  CBC  WBC 4.0 - 10.5 K/uL 4.9  3.9  3.4   Hemoglobin 13.0 - 17.0 g/dL 8.7  8.8  6.9  C  Hematocrit 39.0 - 52.0 % 24.8  24.5  21.0   Platelets 150 - 400 K/uL 93  31  30  14  C    C Corrected result   Multiple values from one day are sorted in reverse-chronological order     .    Latest Ref Rng  & Units 06/13/2022    3:12 AM 06/19/2022    5:55 AM 06/18/2022    3:30 AM  CMP  Glucose 70 - 99 mg/dL 115  116  106   BUN 8 - 23 mg/dL '29  25  27   '$ Creatinine 0.61 - 1.24 mg/dL 1.27  1.14  1.10   Sodium 135 - 145 mmol/L 129  128  131   Potassium 3.5 - 5.1 mmol/L 4.2  4.3  4.4   Chloride 98 - 111 mmol/L 97  100  97   CO2 22 - 32 mmol/L '19  18  18   '$ Calcium 8.9 - 10.3 mg/dL 6.7  7.0  8.0    Component     Latest Ref Rng 06/19/2022 06/17/2022  Prothrombin Time     11.4 - 15.2 seconds  18.4 (H)   Prothrombin Time     11.4 - 15.2 seconds  18.8 (H)   INR     0.8 - 1.2   1.5 (H)   INR     0.8 - 1.2   1.6 (H)   APTT     24 - 36 seconds  30   Fibrinogen     210 - 475 mg/dL  325   D-Dimer, Quant     0.00 - 0.50 ug/mL-FEU  >20.00 (H)   Platelets     150 - 400 K/uL  31 (L)   Smear Review  NO SCHISTOCYTES SEEN   Iron     45 - 182 ug/dL  56   TIBC     250 - 450 ug/dL  175 (L)   Saturation Ratios     17.9 - 39.5 %  32   UIBC     ug/dL  119   Retic Ct Pct     0.4 - 3.1 %  0.6   RBC.     4.22 - 5.81 MIL/uL  3.04 (L)   Retic Count, Absolute     19.0 - 186.0 K/uL  18.5 (L)   Immature Retic Fract     2.3 - 15.9 %  8.1   Immature Platelet Fraction     1.2 - 8.6 % 5.4    Ferritin     24 - 336 ng/mL  >7,500 (H)   Vitamin B12     180 - 914 pg/mL  392   Folate     >5.9 ng/mL  8.4     Legend: (H) High (L) Low  RADIOGRAPHIC STUDIES: I have personally reviewed the radiological images as listed and agreed with the findings in the report. CT ABDOMEN PELVIS W CONTRAST  Result Date: 06/15/2022 CLINICAL DATA:  Metastatic prostate carcinoma bone pain EXAM: CT ABDOMEN AND PELVIS WITH CONTRAST TECHNIQUE: Multidetector CT imaging of the abdomen and pelvis was performed using the standard protocol following bolus administration of intravenous contrast. RADIATION DOSE REDUCTION: This exam was performed according to the departmental dose-optimization program which includes automated exposure  control, adjustment of the mA and/or kV according to patient size and/or use of iterative reconstruction technique. CONTRAST:  55m OMNIPAQUE IOHEXOL 350 MG/ML SOLN COMPARISON:  CT 09/26/2019 bone scan 09/26/2019 FINDINGS: Lower chest: Bilateral layering effusions with lower lobe mild passive atelectasis. Increased bilateral gynecomastia. Hepatobiliary: Benign cyst in the LEFT hepatic lobe. Several gallstones within  the lumen the gallbladder. No gallbladder inflammation. Pancreas: Pancreas is normal. No ductal dilatation. No pancreatic inflammation. Spleen: Normal spleen Adrenals/urinary tract: Adrenal glands and kidneys normal. No ureterolithiasis. Bladder is decompressed around a Foley catheter. Stomach/Bowel: Stomach, small bowel, appendix, and cecum are normal. The colon and rectosigmoid colon are normal. Vascular/Lymphatic: Abdominal aorta is normal caliber. No periportal or retroperitoneal adenopathy. No pelvic adenopathy. Reproductive: Post prostatectomy Other: No pelvic lymphadenopathy. Musculoskeletal: Multiple sclerotic lesions in the pelvis and spine. The number of lesions is increased from [place] 2021. Sclerotic lesion at L 1 is similar but he multiple new lesions in the spine and pelvis noted. Example new sclerotic lesion the central sacrum measuring 23 mm on image 68/3. Lesion in the RIGHT iliac wing measuring 12 mm on image 65/3. Approximately 40 lesions noted. IMPRESSION: 1. Progression sclerotic skeletal metastasis compared to CT 09/26/2019. 2. No pelvic or retroperitoneal lymphadenopathy. 3. Increase in gynecomastia compared to prior. 4. Cholelithiasis without evidence cholecystitis. Electronically Signed   By: Suzy Bouchard M.D.   On: 06/17/2022 09:42   CT L-SPINE NO CHARGE  Result Date: 07/03/2022 CLINICAL DATA:  Progressive lumbar pain EXAM: CT LUMBAR SPINE WITHOUT CONTRAST TECHNIQUE: Multidetector CT imaging of the lumbar spine was performed without intravenous contrast administration.  Multiplanar CT image reconstructions were also generated. RADIATION DOSE REDUCTION: This exam was performed according to the departmental dose-optimization program which includes automated exposure control, adjustment of the mA and/or kV according to patient size and/or use of iterative reconstruction technique. COMPARISON:  CT abdomen 09/14/2014 FINDINGS: Segmentation: 5 lumbar type vertebrae. Alignment: Minimal retrolisthesis of L2 on L3 and L3 on L4. Vertebrae: No acute fracture or subluxation. Sclerotic bone lesions throughout the visualized thoracolumbar spine, sacrum, and ilium concerning for metastatic disease. Paraspinal and other soft tissues: No acute paraspinal abnormality. Mild abdominal aortic atherosclerosis. Other: None Disc levels: Disc spaces: Degenerative disease with disc height loss at L3-4. T12-L1: Minimal broad-based disc bulge. Mild bilateral facet arthropathy. No foraminal or central canal stenosis. L1-L2: Mild broad-based disc bulge. Mild bilateral facet arthropathy. No foraminal or central canal stenosis. L2-L3: Broad-based disc bulge. Moderate bilateral facet arthropathy. Mild spinal stenosis. Mild left foraminal stenosis. No right foraminal stenosis. L3-L4: Broad-based disc bulge. Mild bilateral facet arthropathy. Moderate spinal stenosis. Moderate left foraminal stenosis. No right foraminal stenosis. L4-L5: Broad-based disc bulge. Mild bilateral facet arthropathy. Mild bilateral foraminal stenosis, left greater than right. No spinal stenosis. L5-S1: Broad-based disc bulge. Moderate left facet arthropathy. Mild left foraminal stenosis. No right foraminal stenosis. IMPRESSION: 1. No acute osseous injury of the lumbar spine. 2. Sclerotic bone lesions throughout the visualized thoracolumbar spine, sacrum, and ilium concerning for metastatic disease. 3. Diffuse lumbar spine spondylosis as described above. Electronically Signed   By: Kathreen Devoid M.D.   On: 06/17/2022 09:32   DG Abd 1  View  Result Date: 06/19/2022 CLINICAL DATA:  Distension EXAM: ABDOMEN - 1 VIEW COMPARISON:  CT 09/14/2014 FINDINGS: Mild diffuse increased small and large bowel gas. Bilateral hip replacements. Pelvic phleboliths. Amorphous density in the left upper quadrant could represent something in the enteral stream. No definite radiopaque calculi. IMPRESSION: Mild diffuse increased small and large bowel gas suggestive of mild ileus. Electronically Signed   By: Donavan Foil M.D.   On: 06/19/2022 23:28   DG Chest Port 1 View  Result Date: 06/18/2022 CLINICAL DATA:  Fever, urinary retention EXAM: PORTABLE CHEST 1 VIEW COMPARISON:  06/13/2022 FINDINGS: Left pacer unchanged. Mild cardiomegaly. No confluent opacities, effusions or edema. No  acute bony abnormality. IMPRESSION: Mild cardiomegaly.  No active disease. Electronically Signed   By: Rolm Baptise M.D.   On: 06/18/2022 01:08   CT HEAD WO CONTRAST (5MM)  Result Date: 06/15/2022 CLINICAL DATA:  Stroke follow-up EXAM: CT HEAD WITHOUT CONTRAST TECHNIQUE: Contiguous axial images were obtained from the base of the skull through the vertex without intravenous contrast. RADIATION DOSE REDUCTION: This exam was performed according to the departmental dose-optimization program which includes automated exposure control, adjustment of the mA and/or kV according to patient size and/or use of iterative reconstruction technique. COMPARISON:  06/14/2022 FINDINGS: Brain: There are areas of loss of gray-white differentiation in the left occipital lobe and right parietal lobe, unchanged. No acute hemorrhage. Mild generalized volume loss. There is periventricular hypoattenuation compatible with chronic microvascular disease. Incidentally noted cavum septum pellucidum et vergae. Vascular: No hyperdense vessel or unexpected calcification. Skull: Normal. Negative for fracture or focal lesion. Sinuses/Orbits: No acute finding. Other: None. IMPRESSION: Unchanged areas of loss of  gray-white differentiation in the left occipital lobe and right parietal lobe, consistent with acute/subacute infarcts. No acute hemorrhage. Electronically Signed   By: Ulyses Jarred M.D.   On: 06/15/2022 21:54   ECHOCARDIOGRAM COMPLETE  Result Date: 06/15/2022    ECHOCARDIOGRAM REPORT   Patient Name:   [facility] Date of Exam: 06/15/2022 Medical Rec #:  [id number]        Height:       72.0 in Accession #:    [id number]       Weight:       227.1 lb Date of Birth:  1950/11/22         BSA:          2.249 m Patient Age:    76 years         BP:           120/59 mmHg Patient Gender: M                HR:           88 bpm. Exam Location:  Inpatient Procedure: 2D Echo, Color Doppler and Cardiac Doppler Indications:    Stroke i63.9  History:        Patient has no prior history of Echocardiogram examinations.                 Pacemaker; Risk Factors:Hypertension and Dyslipidemia.  Sonographer:    Raquel Sarna Senior RDCS Referring Phys: [phone] [facility]  Sonographer Comments: Technically difficult due to poor echo windows. IMPRESSIONS  1. Difficult acoustic windows limit study Would recomm limited echo with Definity to confirm LVEF and wall motion. There appears to be hypokinesis of the mid/distal lateral, mid/distal septal, distal inferior walls and apical akinesis.. Left ventricular  ejection fraction, by estimation, is 30%. The left ventricle has moderately decreased function.  2. Right ventricular systolic function is normal. The right ventricular size is normal. There is normal pulmonary artery systolic pressure.  3. The mitral valve is normal in structure. Trivial mitral valve regurgitation.  4. The aortic valve is normal in structure. Aortic valve regurgitation is not visualized.  5. There is mild dilatation of the ascending aorta, measuring 42 mm.  6. The inferior vena cava is normal in size with greater than 50% respiratory variability, suggesting right atrial pressure of 3 mmHg. FINDINGS  Left Ventricle:  Difficult acoustic windows limit study Would recomm limited echo with Definity to confirm LVEF and wall motion. There appears to be hypokinesis of  the mid/distal lateral, mid/distal septal, distal inferior walls and apical akinesis. Left  ventricular ejection fraction, by estimation, is 30%. The left ventricle has moderately decreased function. The left ventricular internal cavity size was normal in size. There is no left ventricular hypertrophy. Right Ventricle: The right ventricular size is normal. Right vetricular wall thickness was not assessed. Right ventricular systolic function is normal. There is normal pulmonary artery systolic pressure. The tricuspid regurgitant velocity is 2.31 m/s, and with an assumed right atrial pressure of 3 mmHg, the estimated right ventricular systolic pressure is 40.9 mmHg. Left Atrium: Left atrial size was normal in size. Right Atrium: Right atrial size was normal in size. Pericardium: Trivial pericardial effusion is present. Mitral Valve: The mitral valve is normal in structure. Trivial mitral valve regurgitation. Tricuspid Valve: The tricuspid valve is grossly normal. Tricuspid valve regurgitation is mild. Aortic Valve: The aortic valve is normal in structure. Aortic valve regurgitation is not visualized. Pulmonic Valve: The pulmonic valve was not well visualized. Pulmonic valve regurgitation is not visualized. No evidence of pulmonic stenosis. Aorta: There is mild dilatation of the ascending aorta, measuring 42 mm. Venous: The inferior vena cava is normal in size with greater than 50% respiratory variability, suggesting right atrial pressure of 3 mmHg. IAS/Shunts: No atrial level shunt detected by color flow Doppler. Additional Comments: A device lead is visualized.  LEFT VENTRICLE PLAX 2D LVIDd:         5.50 cm LVIDs:         3.30 cm LV PW:         1.10 cm LV IVS:        1.20 cm LVOT diam:     2.40 cm LV SV:         85 LV SV Index:   38 LVOT Area:     4.52 cm  LV Volumes  (MOD) LV vol d, MOD A2C: 112.0 ml LV vol d, MOD A4C: 139.0 ml LV vol s, MOD A2C: 65.0 ml LV vol s, MOD A4C: 72.7 ml LV SV MOD A2C:     47.0 ml LV SV MOD A4C:     139.0 ml LV SV MOD BP:      56.6 ml RIGHT VENTRICLE RV S prime:     17.70 cm/s TAPSE (M-mode): 2.2 cm LEFT ATRIUM             Index        RIGHT ATRIUM           Index LA diam:        3.60 cm 1.60 cm/m   RA Area:     21.10 cm LA Vol (A2C):   66.4 ml 29.53 ml/m  RA Volume:   60.60 ml  26.95 ml/m LA Vol (A4C):   63.5 ml 28.24 ml/m LA Biplane Vol: 65.8 ml 29.26 ml/m  AORTIC VALVE LVOT Vmax:   102.00 cm/s LVOT Vmean:  71.100 cm/s LVOT VTI:    0.188 m  AORTA Ao Root diam: 4.00 cm Ao Asc diam:  4.20 cm TRICUSPID VALVE TR Peak grad:   21.3 mmHg TR Vmax:        231.00 cm/s  SHUNTS Systemic VTI:  0.19 m Systemic Diam: 2.40 cm Dorris Carnes MD Electronically signed by Dorris Carnes MD Signature Date/Time: 06/15/2022/4:20:16 PM    Final    CT ANGIO HEAD NECK W WO CM W PERF (CODE STROKE)  Result Date: 06/14/2022 CLINICAL DATA:  Neuro deficit, acute, stroke suspected. Right-sided weakness. EXAM: CT  ANGIOGRAPHY HEAD AND NECK TECHNIQUE: Multidetector CT imaging of the head and neck was performed using the standard protocol during bolus administration of intravenous contrast. Multiplanar CT image reconstructions and MIPs were obtained to evaluate the vascular anatomy. Carotid stenosis measurements (when applicable) are obtained utilizing NASCET criteria, using the distal internal carotid diameter as the denominator. RADIATION DOSE REDUCTION: This exam was performed according to the departmental dose-optimization program which includes automated exposure control, adjustment of the mA and/or kV according to patient size and/or use of iterative reconstruction technique. CONTRAST:  149m OMNIPAQUE IOHEXOL 350 MG/ML SOLN COMPARISON:  CT head without contrast 06/14/2022 at 2:50 p.m. FINDINGS: CTA NECK FINDINGS Aortic arch: Minimal atherosclerotic changes are present the  distal aorta. Three vessel arch configuration is present. No significant stenosis is present at the great vessel origins. Right carotid system: Right common carotid artery is within normal limits. Minimal calcifications are present proximal right ICA without significant stenosis. Mild tortuosity is present in the distal cervical right ICA without significant stenosis. Left carotid system: The left common carotid artery is within normal limits. Bifurcation is unremarkable. Mild tortuosity is present cervical left ICA without significant stenosis. Vertebral arteries: The left vertebral artery is the dominant vessel. Both vertebral arteries originate from the subclavian arteries without significant stenosis. No significant stenosis is present in either vertebral artery in the neck. Skeleton: Multiple sclerotic lesions are present throughout the cervical and upper thoracic spine. Diffuse vertebral body involvement is present at T2 and T3. Asymmetric right-sided involvement is present at T1 and T2. No pathologic fractures are present. Sclerotic lesions are present within ribs bilaterally. Other neck: Soft tissues the neck are otherwise unremarkable. Salivary glands are within normal limits. Thyroid is normal. No significant adenopathy is present. No focal mucosal or submucosal lesions are present. Upper chest: The lung apices are clear. Review of the MIP images confirms the above findings CTA HEAD FINDINGS Anterior circulation: Minimal atherosclerotic changes are present within the cavernous internal carotid arteries bilaterally. The A1 and M1 segments are normal. The anterior communicating artery is patent. MCA bifurcations are within normal limits bilaterally. The ACA and MCA branch vessels are within normal limits bilaterally. Posterior circulation: The left vertebral artery is dominant vessel. PICA origins are visualized and normal. Vertebrobasilar junction and basilar artery is normal. Both posterior cerebral  arteries originate from basilar tip. The PCA branch vessels are normal. Venous sinuses: The dural sinuses are patent. The straight sinus and deep cerebral veins are intact. Cortical veins are within normal limits. No significant vascular malformation is evident. Anatomic variants: None Review of the MIP images confirms the above findings IMPRESSION: 1. No emergent large vessel occlusion. 2. Minimal atherosclerotic changes within the proximal right ICA and cavernous internal carotid arteries bilaterally without significant stenosis. 3. Multiple sclerotic lesions throughout the cervical and upper thoracic spine and ribs bilaterally consistent with metastatic disease. No pathologic fractures are present. Electronically Signed   By: CSan MorelleM.D.   On: 06/14/2022 18:26   CT HEAD WO CONTRAST (5MM)  Result Date: 06/14/2022 CLINICAL DATA:  Abnormal CT of the head. Delayed images were obtained after CTA head to evaluate for possible metastatic disease. EXAM: CT HEAD WITHOUT CONTRAST TECHNIQUE: Contiguous axial images were obtained from the base of the skull through the vertex without intravenous contrast. RADIATION DOSE REDUCTION: This exam was performed according to the departmental dose-optimization program which includes automated exposure control, adjustment of the mA and/or kV according to patient size and/or use of iterative reconstruction technique. COMPARISON:  CT head without contrast 06/14/2022 at 2:50 p.m. FINDINGS: Brain: Delayed images demonstrate no pathologic enhancement. Infarcts involving the posterior right parietal lobe and left occipital lobe are again seen. Central left cerebellar infarct is better visualized. Mild atrophy and white matter changes are stable. No significant interval change is present. Vascular: Normal intravascular enhancement is present. Skull: Calvarium is intact. No focal lytic or blastic lesions are present. No significant extracranial soft tissue lesion is present.  Sinuses/Orbits: The paranasal sinuses and mastoid air cells are clear. The globes and orbits are within normal limits. IMPRESSION: 1. No pathologic enhancement to suggest metastatic disease. 2. Stable infarcts involving the posterior right parietal lobe and left occipital lobe. 3. Central left cerebellar infarct is better visualized. 4. Stable atrophy and white matter disease. This likely reflects the sequela of chronic microvascular ischemia. Electronically Signed   By: San Morelle M.D.   On: 06/14/2022 18:14   CT HEAD CODE STROKE WO CONTRAST  Result Date: 06/14/2022 CLINICAL DATA:  Code stroke. Neuro deficit, acute, stroke suspected. Right-sided weakness. EXAM: CT HEAD WITHOUT CONTRAST TECHNIQUE: Contiguous axial images were obtained from the base of the skull through the vertex without intravenous contrast. RADIATION DOSE REDUCTION: This exam was performed according to the departmental dose-optimization program which includes automated exposure control, adjustment of the mA and/or kV according to patient size and/or use of iterative reconstruction technique. COMPARISON:  . FINDINGS: Brain: Focal hypoattenuation is present in the posterior right parietal lobe measuring up to 2.5 cm. Focal cortical hypoattenuation is also present in the posterior left occipital lobe on image 12 series 3 and image 41 of series 7. No other acute or focal cortical abnormalities are present. Deep brain nuclei are within normal limits. Insular ribbon is normal bilaterally. Cavum septum pellucidum noted. Ventricles are otherwise within normal limits. No significant extraaxial fluid collection is present. The brainstem and cerebellum are within normal limits. Vascular: No hyperdense vessel or unexpected calcification. Skull: Calvarium is intact. No focal lytic or blastic lesions are present. No significant extracranial soft tissue lesion is present. Sinuses/Orbits: The paranasal sinuses and mastoid air cells are clear. The  globes and orbits are within normal limits. ASPECTS [facility] Stroke Program Early CT Score) - Ganglionic level infarction (caudate, lentiform nuclei, internal capsule, insula, M1-M3 cortex): 6/7 - Supraganglionic infarction (M4-M6 cortex): 3/3 Total score (0-10 with 10 being normal): 9/10 IMPRESSION: 1. Focal hypoattenuation in the posterior right parietal lobe measuring up to 2.5 cm. This is concerning for acute or subacute infarct. 2. Focal cortical hypoattenuation in the posterior left occipital lobe is also concerning for acute or subacute infarct. 3. No other acute or focal cortical abnormalities. 4. Aspects is 9/10. The above was relayed via text pager to Dr. Lyn Records on 06/14/2022 at 15:08. Page Electronically Signed   By: San Morelle M.D.   On: 06/14/2022 15:09   DG CHEST PORT 1 VIEW  Result Date: 06/13/2022 CLINICAL DATA:  Encounter for SOB [id number] SOB (shortness of breath) [id number] EXAM: PORTABLE CHEST 1 VIEW COMPARISON:  06/26/2014 FINDINGS: LEFT-sided pacemaker overlies normal cardiac silhouette. No effusion, infiltrate or pneumothorax. Versus arthroplasty LEFT shoulder. IMPRESSION: No acute cardiopulmonary process. Electronically Signed   By: Suzy Bouchard M.D.   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 Getting  treated at [facility] on Pluvicto x 5 cycles with the last treatment in August. Has had extensive progression since then and could not be started on planned cabazitaxel chemotherapy due to progressive cytopenias.   Whole-body bone scan on 05/01/2022 showed  Impression:  New radiotracer uptake in the right posterior approximate ninth rib as well  as increased conspicuity of  bilateral posterior rib uptake, which may  represent worsening metastatic disease.  Attention on follow up.   CT chest abdomen pelvis with contrast on 05/01/2022 showed  Impression:   1. No new metastatic disease in the chest, abdomen, pelvis.  2. Stable diffuse osseous metastatic disease. Correlate with same day bone  scan.   #2 pancytopenia -this appears to primarily be related to the patient's previous history of multiple treatments that could cause bone marrow injury/suppression. Especially his previous use of radioactive radium and recent use of Pluvicto.  In addition patient has also had docetaxel previously. 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  Given significant anisopoikilocytosis with ovalocytes and teardrop cells as well as nucleated red blood cells and some immature Granulocytes in the peripheral blood cannot rule out the possibility of a myelophthisic picture with bone marrow infiltration by prostate cancer.  In addition the patient had elevated procalcitonin levels and fevers of unknown origin and sepsis could also cause additional bone marrow suppression.  Metastatic malignancy as well as sepsis can trigger DIC to be ruled out as well.   #3 hematuria thought to be related to radiation cystitis.  Ongoing and causing acute blood loss anemia.  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  #5 new onset atrial fibrillation  #6 newly noted systolic CHF.  #7 fever of unknown origin with elevated procalcitonin levels.  Infectious disease following and ruling out possible endocarditis or pacemaker lead infection. If no other infectious etiology is found tumor fever is certainly a possibility given significant progression. PLAN -Transfuse PRBC as needed  for hemoglobin less than 8 in the context of possible ischemic cardiomyopathy and stroke as well as high risk of delirium. -Transfuse platelets if actively bleeding or to maintain platelets more than 20k .  Will have to transfuse platelets to maintain platelets close to 50,000 if TEE is strongly indicated or a cystoscopy is planned for fulguration of any actively bleeding bladder lesions. -reviewed results with patient and his wife. Suppressed immature platelet fraction and reticulocyte count along with near zero monocyte count suggestive of significant bone marrow suppression likey from Pluvicto and prior prostate cancer treatments. ( Less likely from sepsis). Additional myeloid left shift and significant anisopoikilocytosis with tear drop cells concerning for myelopthisic picture with bone marrow involvement from metastatic prostate cancer. BM Bx held off per patient/family preference. -additional contributing factors -- ongoing blood loss anemia from hematuria. -consumptive and dilutional thrombocytopenia from platelets usage for hematuria and dilutation from PRBC transfusions. Platelets transfused to >50k for cystoscopy today to try to reduce hematuria. - could consider vit k po @ '5mg'$  po daily x 5-7 days to address dilutional coagulopathy or vit k def with abnormal PT - discuss attempt at use of Nplate with pros and cons and decision was made to hold off on this currently   .The total time spent in the appointment was 35 minutes* .  All of the patient's questions  were answered with apparent satisfaction. The patient knows to call the clinic with any problems, questions or concerns.   Sullivan Lone MD MS AAHIVMS [facility] Hematology/Oncology Physician [facility]  .*Total Encounter Time as defined by the Centers for Medicare and Medicaid Services includes, in addition to the face-to-face time of a patient visit (documented in the note above) non-face-to-face time: obtaining and  reviewing outside history, ordering and reviewing medications, tests or procedures, care coordination (communications with other health care professionals or caregivers) and documentation in the medical record.

## 2022-06-20 NOTE — Progress Notes (Signed)
Inpatient Rehab Admissions Coordinator:     Pt. Is not medically ready for CIR at this time. Will continue to follow for medical stability. I will likely have to submit case to insurance again next week.   Clemens Catholic, [place], [place] Admissions Coordinator  [phone] ([place]) [phone] (office)

## 2022-06-20 NOTE — Anesthesia Postprocedure Evaluation (Signed)
Anesthesia Post Note  Patient: Devin Mercado  Procedure(s) Performed: [place]     Patient location during evaluation: PACU Anesthesia Type: General Level of consciousness: awake and alert Pain management: pain level controlled Vital Signs Assessment: post-procedure vital signs reviewed and stable Respiratory status: spontaneous breathing, nonlabored ventilation, respiratory function stable and patient connected to nasal cannula oxygen Cardiovascular status: blood pressure returned to baseline and stable Postop Assessment: no apparent nausea or vomiting Anesthetic complications: no   No notable events documented.  Last Vitals:  Vitals:   07/07/2022 2200 06/26/2022 2220  BP: 127/87 128/69  Pulse: 86 85  Resp: 17 20  Temp:  36.9 C  SpO2: 93% 97%    Last Pain:  Vitals:   06/30/2022 2220  TempSrc: Oral  PainSc: [place]

## 2022-06-20 NOTE — Progress Notes (Signed)
Occupational Therapy Treatment Patient Details Name: Devin Mercado MRN: [id number] DOB: May 21, 1951 Today's Date: 06/14/2022   History of present illness This is a 71 year old male presents with hematuria. 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 PHMx: prostate cancer with bone mets, anemia, pancytopenia. L Reverse TSA April 2023   OT comments  Pt found having a great amount of pain today in low back and declining therapy.  After speaking with pt about prior level of functioning, pt agreed to some positional changes. Worked on sit to stand transfers x2 with mod assist with focus on hand placement and visual training.  Gentle massage to pt's lower back completed as well as new positioning in chair which appeared to bring relief. Nursing checking to see if he can have some pain meds.  Will continue to see with focus on OOB.    Recommendations for follow up therapy are one component of a multi-disciplinary discharge planning process, led by the attending physician.  Recommendations may be updated based on patient status, additional functional criteria and insurance authorization.    Follow Up Recommendations        Assistance Recommended at Discharge    Patient can return home with the following      Equipment Recommendations       Recommendations for Other Services      Precautions / Restrictions Precautions Precautions: Fall Precaution Comments: L TSA 10/2021 - very painful Restrictions Weight Bearing Restrictions: No       Mobility Bed Mobility               General bed mobility comments: Pt in chair on arrival.    Transfers Overall transfer level: Needs assistance Equipment used: Rolling walker (2 wheels) Transfers: Sit to/from Stand Sit to Stand: Mod assist           General transfer comment: Mod assist to power up and constant cues for hand  placement. Pt with painful L shoulder from surgery.     Balance Overall balance assessment: Needs assistance Sitting-balance support: Feet supported, No upper extremity supported Sitting balance-Leahy Scale: Fair     Standing balance support: Bilateral upper extremity supported, During functional activity, Reliant on assistive device for balance Standing balance-Leahy Scale: Poor Standing balance comment: reliance on RW for support                           ADL either performed or assessed with clinical judgement   ADL Overall ADL's : Needs assistance/impaired Eating/Feeding: Minimal assistance;Sitting Eating/Feeding Details (indicate cue type and reason): Pt squeezes cup very hard with BUEs. Not sure pt is aware of how much pressure he puts on cup proprioceptively.                                 Functional mobility during ADLs: Moderate assistance;Rolling walker (2 wheels) General ADL Comments: Pt in a lot of pain today so session limited. Pt transferred sit to stand x2 to stretch out back muscles and relieve pain. Pt required mod assist to stand from recliner.    Extremity/Trunk Assessment Upper Extremity Assessment Upper Extremity Assessment: RUE deficits/detail;LUE deficits/detail RUE Deficits / Details: Ataxic when reaching up for walker and adl items away from body and toward tray RUE Coordination: decreased fine motor;decreased gross motor  LUE: Unable to fully assess due to pain            Vision   Additional Comments: Pt needs further visual testing. Pt declined doing this today as eyes were close most of time due to pain and fatigue.   Perception     Praxis      Cognition Arousal/Alertness: Awake/alert Behavior During Therapy: Flat affect Overall Cognitive Status: Impaired/Different from baseline Area of Impairment: Following commands, Awareness, Problem solving, Safety/judgement                       Following Commands:  Follows one step commands consistently Safety/Judgement: Decreased awareness of deficits, Decreased awareness of safety Awareness: Emergent Problem Solving: Slow processing, Difficulty sequencing General Comments: Pt impulsive at times. Pt in a lot of pain on arrival declining therapy due to back pain and so uncomfortable.  Pt normally very active and on his feet.  Spoke with pt about allowing some soft tissue massage of low back as well as getting up on his feet to attempt alleviate some back pain since pt is hurting so badly. Pt was agreeable and did well.        Exercises      Shoulder Instructions       General Comments Pt limited by pain today.  Did some massage to lower back to attempt to find pain relief as well as position changes.  Positioned pt in chair differently to attempt to find comfort which appeared successful.    Pertinent Vitals/ Pain       Pain Assessment Pain Assessment: Faces Faces Pain Scale: Hurts worst Pain Location: back Pain Descriptors / Indicators: Moaning, Sore Pain Intervention(s): Limited activity within patient's tolerance, Monitored during session, Repositioned, Patient requesting pain meds-RN notified  Home Living                                          Prior Functioning/Environment              Frequency           Progress Toward Goals  OT Goals(current goals can now be found in the care plan section)        Plan      Co-evaluation                 AM-PAC OT "6 Clicks" Daily Activity     Outcome Measure                    End of Session        Activity Tolerance     Patient Left     Nurse Communication          Time:  -     Charges:      Glenford Peers 06/25/2022, 1:39 PM

## 2022-06-20 NOTE — Progress Notes (Signed)
Mobility Specialist: Progress Note   06/19/2022 1623  Mobility  Activity Transferred from chair to bed  Level of Assistance Moderate assist, patient does 50-74%  Assistive Device Front wheel walker  Distance Ambulated (ft) 2 ft  Activity Response Tolerated well  Mobility Referral Yes  $Mobility charge 1 Mobility   Pt received in the chair and assisted back to bed per request. ModA to stand. Verbal cues for RW management and direction. No c/o throughout. Pt back in bed with RN present in the room.   Delta Michaeljohn Biss Mobility Specialist Please contact via SecureChat or Rehab office at [phone]

## 2022-06-20 NOTE — Plan of Care (Signed)
  Problem: Education: Goal: Knowledge of General Education information will improve Description: Including pain rating scale, medication(s)/side effects and non-pharmacologic comfort measures Outcome: Progressing   Problem: Health Behavior/Discharge Planning: Goal: Ability to manage health-related needs will improve Outcome: Progressing   Problem: Clinical Measurements: Goal: Ability to maintain clinical measurements within normal limits will improve Outcome: Progressing Goal: Will remain free from infection Outcome: Progressing Goal: Diagnostic test results will improve Outcome: Progressing Goal: Respiratory complications will improve Outcome: Progressing Goal: Cardiovascular complication will be avoided Outcome: Progressing   Problem: Activity: Goal: Risk for activity intolerance will decrease Outcome: Progressing   Problem: Nutrition: Goal: Adequate nutrition will be maintained Outcome: Progressing   Problem: Coping: Goal: Level of anxiety will decrease Outcome: Progressing   Problem: Elimination: Goal: Will not experience complications related to bowel motility Outcome: Progressing Goal: Will not experience complications related to urinary retention Outcome: Progressing   Problem: Pain Managment: Goal: General experience of comfort will improve Outcome: Progressing   Problem: Safety: Goal: Ability to remain free from injury will improve Outcome: Progressing   Problem: Skin Integrity: Goal: Risk for impaired skin integrity will decrease Outcome: Progressing   Problem: Education: Goal: Knowledge of disease or condition will improve Outcome: Progressing Goal: Knowledge of secondary prevention will improve (MUST DOCUMENT ALL) Outcome: Progressing

## 2022-06-20 NOTE — Progress Notes (Signed)
PROGRESS NOTE    Devin Mercado  NWG:[id number] DOB: January 20, 1951 DOA: 05/15/2022 PCP: [name], MD    Brief Narrative:   Devin Mercado is a 71 y.o. male with past medical history significant for prostate cancer with bone metastasis followed by oncology at [facility], anemia associate with bone marrow infiltration, pancytopenia 2/2 bone marrow suppression from Pluvicto who presented to [facility] ED on 11/30 with hematuria.  Patient was seen at [facility] urology earlier in the day and underwent bladder irrigation and subsequently when returned home started passing clots and unable to fully empty his bladder with increased pain and distention.  Patient was admitted to the hospital service at [facility] and urology was consulted and patient had Foley catheter placed with continuous bladder irrigation.  06/14/2022 at 9 AM patient developed right upper extremity ataxia and neurology was consulted given findings of CT head concerning for embolic CVA.  Unable to obtain MRI due to pacemaker with pacemaker interrogation notable for A-fib.  Unfortunate not a candidate for anticoagulation given his hematuria, acute blood loss anemia and thrombocytopenia.  Patient was transferred to [facility] for further neurology evaluation.  Assessment & Plan:   Acute CVA, suspicious for embolic Patient developed acute right upper extremity weakness on 12/2, CT with focal hypoattenuation posterior right parietal lobe measuring up to 2.5 cm concerning for acute versus subacute infarct, focal cortical hypoattenuation posterior left occipital lobe also concerning for infarct, suspect cardioembolic source from new onset A-fib.  CTA head/neck with no emergent LVO.  Unable to obtain MRI due to noncompatible PPM.  TTE with LVEF 30% with apical hypokinesis and diffuse hypokinesis.  LDL 98, hemoglobin A1c 5.6.  Patient is not a good long-term anticoagulation candidate due to his symptomatic anemia and  thrombocytopenia; but may be eligible for a Watchman device outpatient.  Neurology now signed off; recommend starting aspirin 81 mg p.o. daily once platelet count stable above 50 K. -- TEE: Pending; platlets need to be greater than 50K -- Atorvastatin 20 g p.o. daily -- Continue monitor on telemetry  Acute urinary retention with hematuria, hemorrhagic cystitis Patient presenting to the ED following a recurrence of blood clots in his urine with recurrent urinary retention.  Underwent Foley catheter placement and continuous bladder irrigation per urology; now catheter has been discontinued -- Urology following, appreciate assistance -- Tamsulosin 0.4 mg p.o. daily -- Continue to monitor CBC daily, monitoring of strict I's and O's, urine output, foley now placed 12/7 -- Urology plans cystoscopy this evening, n.p.o.  Acute symptomatic blood loss anemia Pancytopenia Patient was transfused 5 unit PRBCs and 10 units of platelets during hospitalization.  Patient was previously on Pluvicto; but was stopped due to severe anemia/thrombocytopenia.  Unclear etiology for continued dropping of platelets, whether it is continued platelet consumption from unclear etiology versus continued marrow suppression from chemotherapy versus progression of underlying malignancy.  Seen by hematology, Dr. Irene Limbo. -- Hgb 7.2>>6.9>8.3>9.0>7.2>8.8 -- Plt 42>>25>53>44>31>21>14>31 -- Transfuse 4 units platelets today -- Repeat platelet count prior to OR this evening and CBC in a.m.  Fever of unclear origin Patient continues with intermittent fevers, Tmax 100.4 overnight.  Unclear etiology.  Chest x-ray without acute cardiopulmonary disease process.  Urinalysis unrevealing.  GI panel negative. -- Infectious disease following, appreciate assistance -- Blood Cultures 12/1: no growth x 5 days -- ID ordered Karius test on 12/6: pending -- TEE pending if platelet count greater than 50 K  Paroxysmal atrial fibrillation Seen by  cardiology, started on  amiodarone.  CHA2DS2-VASc score = 5, unable to anticoagulate due to ongoing hematuria and thrombocytopenia; but may be a candidate for a Watchman device in the future. -- Amiodarone 200 mg p.o. twice daily -- EP recommended addition of Toprol 25 mg daily when able -- Continue monitor on telemetry -- Outpatient follow-up with electrophysiology  Acute systolic congestive heart failure, new diagnosis; decided TTE with LVEF 30%, regional wall motion normalities. -- Cardiology following, appreciate assistance; plan outpatient ischemic workup possibly -- Cardiology to consider GDMT on discharge  Metastatic prostate cancer to bone Follows with oncology at [facility], Dr. Lamonte [name].  Completed 5 cycles of Pluvicto, but was stopped due to severe anemia/thrombocytopenia.  Next plan to start cabazitaxel.  Continue outpatient follow-up with oncology. -- CT L-spine with contrast due to progressive low back pain: pending  Essential hypertension On valsartan 160 mg p.o. daily outpatient, currently on hold.  Blood pressure 121/75 this morning. -- Continue monitor BP closely  Hyperlipidemia -- Atorvastatin 10 mg p.o. daily  Complete heart block s/p PPM Entire patient has a PPM noted new onset A-fib.  CHADS2 Vasc score = 5, unable to anticoagulate with ongoing hematuria and thrombocytopenia.  Euthyroid sick syndrome Elevated TSH but normal T4.  Recommend repeat TSH outpatient 4-6 weeks.  Hyponatremia Continue to encourage increased oral intake, repeat BMP in a.m.  Hypocalcemia Repleted during hospitalization  Hypokalemia Repleted, potassium 4.2 this morning.  Weakness/debility/deconditioning: -- Continue PT/OT efforts while inpatient -- Plan CIR on discharge   DVT prophylaxis: SCDs Start: 06/13/22 0135    Code Status: Full Code Family Communication: No family present at bedside this morning, updated patient's daughter and spouse present at bedside on multiple occasions  yesterday  Disposition Plan:  Level of care: Telemetry Medical Status is: Inpatient Remains inpatient appropriate because: Pending TEE, cystoscopy today, awaiting specialist sign off then plan CIR on discharge    Consultants:  Cardiology/electrophysiology Neurology Urology, [name] Infectious disease Oncology/hematology, Dr. Irene Limbo  Procedures:  TTE:  TEE: Pending Cystoscopy: Pending  Antimicrobials:  Ciprofloxacin 10/2 - 10/2   Subjective: Patient seen examined bedside, resting comfortably.  Lying in bed.  No family present this morning.  Platelet count up to 31 this morning, still need to reach threshold of 50 so we can have TEE and planned cystoscopy this evening.  Discussed case with [name] this morning.  Transfusing 4 units of platelets today. No other specific complaints or concerns at this time per patient.  Continues with recurrent fevers, although improved with Tmax 100.4 past 24 hours.  Denies headache, no dizziness, no chills/night sweats, no nausea/vomiting/diarrhea, no chest pain, no palpitations, no dizziness, no abdominal pain, no paresthesias.  No acute events overnight per nursing staff.  Objective: Vitals:   06/19/2022 0819 07/04/2022 1137 06/22/2022 1152 06/18/2022 1214  BP: 120/72 123/82 112/70 124/76  Pulse: 94 (!) 101 95 95  Resp: 18 20 (!) 22 19  Temp: 99 F (37.2 C) 99.1 F (37.3 C) 99.4 F (37.4 C) 99.4 F (37.4 C)  TempSrc: Oral Axillary Axillary Axillary  SpO2: 98% 96% 98% 98%  Weight:      Height:        Intake/Output Summary (Last 24 hours) at 07/04/2022 1224 Last data filed at 07/03/2022 1214 Gross per 24 hour  Intake 2278.42 ml  Output 1475 ml  Net 803.42 ml   Filed Weights   05/28/2022 1716 05/17/2022 2359  Weight: 102.1 kg 103 kg    Examination:  Physical Exam: GEN: NAD, alert and oriented x  3, ill in appearance HEENT: NCAT, PERRL, EOMI, sclera clear, MMM PULM: CTAB w/o wheezes/crackles, normal respiratory effort, on room air CV:  RRR w/o M/G/R GI: abd soft, NTND, NABS, no R/G/M MSK: no peripheral edema, moves all extremities independently NEURO: CN II-XII intact, sensation to light touch intact PSYCH: normal mood/affect Integumentary: dry/intact, no rashes or wounds    Data Reviewed: I have personally reviewed following labs and imaging studies  CBC: Recent Labs  Lab 06/13/22 1901 06/14/22 0709 06/14/22 0844 06/14/22 2302 06/17/22 0654 06/18/22 0330 06/19/22 0555 06/19/22 2047 07/06/2022 0312  WBC 3.6*  --  2.4*   < > 3.5* 4.0 3.3* 3.4* 3.9*  NEUTROABS 1.9  --  1.4*  --   --   --   --  2.3  --   HGB 8.1*   < > 7.3*   < > 8.3* 9.0* 7.2* 6.9* 8.8*  HCT 24.6*   < > 22.5*   < > 23.1* 25.4* 20.7* 21.0* 24.5*  MCV 85.4  --  86.2   < > 80.8 81.2 81.5 88.2 81.1  PLT 80*  --  43*   < > 44* 31* 21* 14* 31*  30*   < > = values in this interval not displayed.   Basic Metabolic Panel: Recent Labs  Lab 06/13/22 1901 06/14/22 0254 06/16/22 0715 06/17/22 0654 06/18/22 0330 06/19/22 0555 07/06/2022 0312  NA 125*   < > 129* 134* 131* 128* 129*  K 4.3   < > 3.2* 4.1 4.4 4.3 4.2  CL 98   < > 100 107 97* 100 97*  CO2 18*   < > 17* 18* 18* 18* 19*  GLUCOSE 142*   < > 114* 102* 106* 116* 115*  BUN 30*   < > 32* 32* 27* 25* 29*  CREATININE 1.17   < > 1.18 1.11 1.10 1.14 1.27*  CALCIUM 6.9*   < > 6.6* 7.1* 8.0* 7.0* 6.7*  MG 2.1  --   --   --   --   --   --    < > = values in this interval not displayed.   GFR: Estimated Creatinine Clearance: 64.3 mL/min (A) (by C-G formula based on SCr of 1.27 mg/dL (H)). Liver Function Tests: No results for input(s): "AST", "ALT", "ALKPHOS", "BILITOT", "PROT", "ALBUMIN" in the last 168 hours.  No results for input(s): "LIPASE", "AMYLASE" in the last 168 hours.  No results for input(s): "AMMONIA" in the last 168 hours. Coagulation Profile: Recent Labs  Lab 06/13/22 1901 06/29/2022 0312  INR 1.7* 1.6*  1.5*   Cardiac Enzymes: No results for input(s): "CKTOTAL", "CKMB",  "CKMBINDEX", "TROPONINI" in the last 168 hours. BNP (last 3 results) No results for input(s): "PROBNP" in the last 8760 hours. HbA1C: No results for input(s): "HGBA1C" in the last 72 hours. CBG: Recent Labs  Lab 06/14/22 1056 06/14/22 1537  GLUCAP 121* 172*   Lipid Profile: No results for input(s): "CHOL", "HDL", "LDLCALC", "TRIG", "CHOLHDL", "LDLDIRECT" in the last 72 hours. Thyroid Function Tests: No results for input(s): "TSH", "T4TOTAL", "FREET4", "T3FREE", "THYROIDAB" in the last 72 hours.  Anemia Panel: Recent Labs    06/27/2022 0312  VITAMINB12 392  FOLATE 8.4  FERRITIN >7,500*  TIBC 175*  IRON 56   Sepsis Labs: Recent Labs  Lab 06/14/22 0254 06/15/22 0814 06/16/22 0715 06/17/22 0654  PROCALCITON  --  14.08 7.37 4.00  LATICACIDVEN 1.0  --   --   --     Recent Results (  from the past 240 hour(s))  Culture, blood (Routine X 2) w Reflex to ID Panel     Status: None   Collection Time: 06/13/22  7:01 PM   Specimen: BLOOD  Result Value Ref Range Status   Specimen Description   Final    BLOOD LEFT ANTECUBITAL Performed at [facility] [address], [place], [place] [address]    Special Requests   Final    BOTTLES DRAWN AEROBIC ONLY Blood Culture adequate volume Performed at [facility] [address] [place], [place] [address]    Culture   Final    NO GROWTH 5 DAYS Performed at [facility], [place] [address], [place], [place] [address]    Report Status 06/18/2022 FINAL  Final  Culture, blood (Routine X 2) w Reflex to ID Panel     Status: None   Collection Time: 06/13/22  7:04 PM   Specimen: BLOOD  Result Value Ref Range Status   Specimen Description   Final    BLOOD BLOOD RIGHT HAND Performed at [facility] [address], [place], [place] [address]    Special Requests   Final    BOTTLES DRAWN AEROBIC ONLY Blood Culture results may not be optimal due to an inadequate volume of blood  received in culture bottles Performed at [facility] [address], [place], [place] [address]    Culture   Final    NO GROWTH 5 DAYS Performed at [facility], [place] [address] [place], [place] [address]    Report Status 06/18/2022 FINAL  Final  Gastrointestinal Panel by PCR , Stool     Status: None   Collection Time: 06/16/22  4:01 AM   Specimen: Stool  Result Value Ref Range Status   Campylobacter species NOT DETECTED NOT DETECTED Final   Plesimonas shigelloides NOT DETECTED NOT DETECTED Final   Salmonella species NOT DETECTED NOT DETECTED Final   Yersinia enterocolitica NOT DETECTED NOT DETECTED Final   Vibrio species NOT DETECTED NOT DETECTED Final   Vibrio cholerae NOT DETECTED NOT DETECTED Final   Enteroaggregative E coli (EAEC) NOT DETECTED NOT DETECTED Final   Enteropathogenic E coli (EPEC) NOT DETECTED NOT DETECTED Final   Enterotoxigenic E coli (ETEC) NOT DETECTED NOT DETECTED Final   Shiga like toxin producing E coli (STEC) NOT DETECTED NOT DETECTED Final   Shigella/Enteroinvasive E coli (EIEC) NOT DETECTED NOT DETECTED Final   Cryptosporidium NOT DETECTED NOT DETECTED Final   Cyclospora cayetanensis NOT DETECTED NOT DETECTED Final   Entamoeba histolytica NOT DETECTED NOT DETECTED Final   Giardia lamblia NOT DETECTED NOT DETECTED Final   Adenovirus F40/41 NOT DETECTED NOT DETECTED Final   Astrovirus NOT DETECTED NOT DETECTED Final   Norovirus GI/GII NOT DETECTED NOT DETECTED Final   Rotavirus A NOT DETECTED NOT DETECTED Final   Sapovirus (I, II, IV, and V) NOT DETECTED NOT DETECTED Final    Comment: Performed at [facility], [address] [place], [place] [address]         Radiology Studies: CT ABDOMEN PELVIS W CONTRAST  Result Date: 06/29/2022 CLINICAL DATA:  Metastatic prostate carcinoma bone pain EXAM: CT ABDOMEN AND PELVIS WITH CONTRAST TECHNIQUE: Multidetector CT imaging of the abdomen and pelvis was performed using  the standard protocol following bolus administration of intravenous contrast. RADIATION DOSE REDUCTION: This exam was performed according to the departmental dose-optimization program which includes automated exposure control, adjustment of the mA and/or  kV according to patient size and/or use of iterative reconstruction technique. CONTRAST:  52m OMNIPAQUE IOHEXOL 350 MG/ML SOLN COMPARISON:  CT 09/26/2019 bone scan 09/26/2019 FINDINGS: Lower chest: Bilateral layering effusions with lower lobe mild passive atelectasis. Increased bilateral gynecomastia. Hepatobiliary: Benign cyst in the LEFT hepatic lobe. Several gallstones within the lumen the gallbladder. No gallbladder inflammation. Pancreas: Pancreas is normal. No ductal dilatation. No pancreatic inflammation. Spleen: Normal spleen Adrenals/urinary tract: Adrenal glands and kidneys normal. No ureterolithiasis. Bladder is decompressed around a Foley catheter. Stomach/Bowel: Stomach, small bowel, appendix, and cecum are normal. The colon and rectosigmoid colon are normal. Vascular/Lymphatic: Abdominal aorta is normal caliber. No periportal or retroperitoneal adenopathy. No pelvic adenopathy. Reproductive: Post prostatectomy Other: No pelvic lymphadenopathy. Musculoskeletal: Multiple sclerotic lesions in the pelvis and spine. The number of lesions is increased from [id number]. Sclerotic lesion at L 1 is similar but he multiple new lesions in the spine and pelvis noted. Example new sclerotic lesion the central sacrum measuring 23 mm on image 68/3. Lesion in the RIGHT iliac wing measuring 12 mm on image 65/3. Approximately 40 lesions noted. IMPRESSION: 1. Progression sclerotic skeletal metastasis compared to CT 09/26/2019. 2. No pelvic or retroperitoneal lymphadenopathy. 3. Increase in gynecomastia compared to prior. 4. Cholelithiasis without evidence cholecystitis. Electronically Signed   By: SSuzy BouchardM.D.   On: 06/19/2022 09:42   CT L-SPINE NO  CHARGE  Result Date: 06/28/2022 CLINICAL DATA:  Progressive lumbar pain EXAM: CT LUMBAR SPINE WITHOUT CONTRAST TECHNIQUE: Multidetector CT imaging of the lumbar spine was performed without intravenous contrast administration. Multiplanar CT image reconstructions were also generated. RADIATION DOSE REDUCTION: This exam was performed according to the departmental dose-optimization program which includes automated exposure control, adjustment of the mA and/or kV according to patient size and/or use of iterative reconstruction technique. COMPARISON:  CT abdomen 09/14/2014 FINDINGS: Segmentation: 5 lumbar type vertebrae. Alignment: Minimal retrolisthesis of L2 on L3 and L3 on L4. Vertebrae: No acute fracture or subluxation. Sclerotic bone lesions throughout the visualized thoracolumbar spine, sacrum, and ilium concerning for metastatic disease. Paraspinal and other soft tissues: No acute paraspinal abnormality. Mild abdominal aortic atherosclerosis. Other: None Disc levels: Disc spaces: Degenerative disease with disc height loss at L3-4. T12-L1: Minimal broad-based disc bulge. Mild bilateral facet arthropathy. No foraminal or central canal stenosis. L1-L2: Mild broad-based disc bulge. Mild bilateral facet arthropathy. No foraminal or central canal stenosis. L2-L3: Broad-based disc bulge. Moderate bilateral facet arthropathy. Mild spinal stenosis. Mild left foraminal stenosis. No right foraminal stenosis. L3-L4: Broad-based disc bulge. Mild bilateral facet arthropathy. Moderate spinal stenosis. Moderate left foraminal stenosis. No right foraminal stenosis. L4-L5: Broad-based disc bulge. Mild bilateral facet arthropathy. Mild bilateral foraminal stenosis, left greater than right. No spinal stenosis. L5-S1: Broad-based disc bulge. Moderate left facet arthropathy. Mild left foraminal stenosis. No right foraminal stenosis. IMPRESSION: 1. No acute osseous injury of the lumbar spine. 2. Sclerotic bone lesions throughout the  visualized thoracolumbar spine, sacrum, and ilium concerning for metastatic disease. 3. Diffuse lumbar spine spondylosis as described above. Electronically Signed   By: HKathreen DevoidM.D.   On: 06/24/2022 09:32   DG Abd 1 View  Result Date: 06/19/2022 CLINICAL DATA:  Distension EXAM: ABDOMEN - 1 VIEW COMPARISON:  CT 09/14/2014 FINDINGS: Mild diffuse increased small and large bowel gas. Bilateral hip replacements. Pelvic phleboliths. Amorphous density in the left upper quadrant could represent something in the enteral stream. No definite radiopaque calculi. IMPRESSION: Mild diffuse increased small and large bowel gas suggestive of mild ileus.  Electronically Signed   By: Donavan Foil M.D.   On: 06/19/2022 23:28        Scheduled Meds:  sodium chloride   Intravenous Once   amiodarone  200 mg Oral BID   atorvastatin  20 mg Oral Daily   B-complex with vitamin C  1 tablet Oral Daily   famotidine  20 mg Oral Daily   lidocaine  1 patch Transdermal Q24H   predniSONE  10 mg Oral q morning   sodium bicarbonate  650 mg Oral TID   tamsulosin  0.4 mg Oral QPC supper   Continuous Infusions:  sodium chloride 50 mL/hr at 07/10/2022 0600   sodium chloride 20 mL/hr at 07/12/2022 0600     LOS: 8 days    Time spent: 45 minutes spent on chart review, discussion with nursing staff, consultants, updating family and interview/physical exam; more than 50% of that time was spent in counseling and/or coordination of care.    Minerva Bluett J [place] DO [facility] Available via Epic secure chat 7am-7pm After these hours, please refer to coverage provider listed on [email or web address] 07/02/2022, 12:24 PM

## 2022-06-21 ENCOUNTER — Encounter (HOSPITAL_COMMUNITY): Payer: Self-pay | Admitting: Urology

## 2022-06-21 ENCOUNTER — Encounter (HOSPITAL_COMMUNITY): Admission: EM | Disposition: E | Payer: Self-pay | Source: Home / Self Care | Attending: Internal Medicine

## 2022-06-21 ENCOUNTER — Inpatient Hospital Stay (HOSPITAL_COMMUNITY): Payer: Medicare PPO

## 2022-06-21 ENCOUNTER — Inpatient Hospital Stay (HOSPITAL_COMMUNITY): Payer: Medicare PPO | Admitting: Anesthesiology

## 2022-06-21 DIAGNOSIS — R509 Fever, unspecified: Secondary | ICD-10-CM | POA: Diagnosis not present

## 2022-06-21 DIAGNOSIS — I63511 Cerebral infarction due to unspecified occlusion or stenosis of right middle cerebral artery: Secondary | ICD-10-CM | POA: Diagnosis present

## 2022-06-21 DIAGNOSIS — N289 Disorder of kidney and ureter, unspecified: Secondary | ICD-10-CM | POA: Diagnosis not present

## 2022-06-21 DIAGNOSIS — I10 Essential (primary) hypertension: Secondary | ICD-10-CM

## 2022-06-21 DIAGNOSIS — I4891 Unspecified atrial fibrillation: Secondary | ICD-10-CM

## 2022-06-21 HISTORY — PX: RADIOLOGY WITH ANESTHESIA: SHX6223

## 2022-06-21 LAB — BPAM PLATELET PHERESIS
Blood Product Expiration Date: 202312102359
Blood Product Expiration Date: 202312102359
Blood Product Expiration Date: 202312102359
Blood Product Expiration Date: 202312112359
ISSUE DATE / TIME: 202312081128
ISSUE DATE / TIME: 202312081128
ISSUE DATE / TIME: 202312081401
ISSUE DATE / TIME: 202312081401
Unit Type and Rh: 6200
Unit Type and Rh: 6200
Unit Type and Rh: 6200
Unit Type and Rh: 6200

## 2022-06-21 LAB — PREPARE PLATELET PHERESIS
Unit division: 0
Unit division: 0
Unit division: 0
Unit division: 0

## 2022-06-21 LAB — CBC
HCT: 23.4 % — ABNORMAL LOW (ref 39.0–52.0)
Hemoglobin: 8.5 g/dL — ABNORMAL LOW (ref 13.0–17.0)
MCH: 29.7 pg (ref 26.0–34.0)
MCHC: 36.3 g/dL — ABNORMAL HIGH (ref 30.0–36.0)
MCV: 81.8 fL (ref 80.0–100.0)
Platelets: 72 10*3/uL — ABNORMAL LOW (ref 150–400)
RBC: 2.86 MIL/uL — ABNORMAL LOW (ref 4.22–5.81)
RDW: 16.9 % — ABNORMAL HIGH (ref 11.5–15.5)
WBC: 4.3 10*3/uL (ref 4.0–10.5)
nRBC: 0 % (ref 0.0–0.2)

## 2022-06-21 LAB — GLUCOSE, CAPILLARY
Glucose-Capillary: 153 mg/dL — ABNORMAL HIGH (ref 70–99)
Glucose-Capillary: 198 mg/dL — ABNORMAL HIGH (ref 70–99)

## 2022-06-21 LAB — BASIC METABOLIC PANEL
Anion gap: 13 (ref 5–15)
BUN: 30 mg/dL — ABNORMAL HIGH (ref 8–23)
CO2: 19 mmol/L — ABNORMAL LOW (ref 22–32)
Calcium: 6.4 mg/dL — CL (ref 8.9–10.3)
Chloride: 98 mmol/L (ref 98–111)
Creatinine, Ser: 1.26 mg/dL — ABNORMAL HIGH (ref 0.61–1.24)
GFR, Estimated: >60 mL/min (ref >60)
Glucose, Bld: 156 mg/dL — ABNORMAL HIGH (ref 70–99)
Potassium: 4.3 mmol/L (ref 3.5–5.1)
Sodium: 130 mmol/L — ABNORMAL LOW (ref 135–145)

## 2022-06-21 LAB — SARS CORONAVIRUS 2 BY RT PCR: SARS Coronavirus 2 by RT PCR: NEGATIVE

## 2022-06-21 SURGERY — IR WITH ANESTHESIA
Anesthesia: General

## 2022-06-21 MED ORDER — LIDOCAINE HCL (CARDIAC) PF 100 MG/5ML IV SOSY
PREFILLED_SYRINGE | INTRAVENOUS | Status: DC | PRN
  Administered 2022-06-21: 30 mg via INTRAVENOUS

## 2022-06-21 MED ORDER — SODIUM CHLORIDE 0.9 % IV SOLN: INTRAVENOUS | Status: DC | PRN

## 2022-06-21 MED ORDER — TICAGRELOR 90 MG PO TABS
ORAL_TABLET | ORAL | Status: AC
  Filled 2022-06-21: qty 2

## 2022-06-21 MED ORDER — PROPOFOL 10 MG/ML IV BOLUS
INTRAVENOUS | Status: DC | PRN
  Administered 2022-06-21: 100 mg via INTRAVENOUS

## 2022-06-21 MED ORDER — TIROFIBAN HCL IN NACL 5-0.9 MG/100ML-% IV SOLN
INTRAVENOUS | Status: AC
  Filled 2022-06-21: qty 100

## 2022-06-21 MED ORDER — ASPIRIN 81 MG PO CHEW
CHEWABLE_TABLET | ORAL | Status: AC
  Filled 2022-06-21: qty 1

## 2022-06-21 MED ORDER — STROKE: EARLY STAGES OF RECOVERY BOOK
Freq: Once | Status: AC
  Filled 2022-06-21: qty 1

## 2022-06-21 MED ORDER — SODIUM CHLORIDE 0.9 % IV SOLN: INTRAVENOUS | Status: DC

## 2022-06-21 MED ORDER — IOHEXOL 350 MG/ML SOLN
75.0000 mL | Freq: Once | INTRAVENOUS | Status: AC | PRN
  Administered 2022-06-21: 75 mL via INTRAVENOUS

## 2022-06-21 MED ORDER — CEFAZOLIN SODIUM-DEXTROSE 2-3 GM-%(50ML) IV SOLR
INTRAVENOUS | Status: DC | PRN
  Administered 2022-06-21: 2 g via INTRAVENOUS

## 2022-06-21 MED ORDER — CANGRELOR TETRASODIUM 50 MG IV SOLR
INTRAVENOUS | Status: AC
  Filled 2022-06-21: qty 50

## 2022-06-21 MED ORDER — SODIUM CHLORIDE (PF) 0.9 % IJ SOLN
INTRAVENOUS | Status: DC | PRN
  Administered 2022-06-21: 25 ug via INTRA_ARTERIAL

## 2022-06-21 MED ORDER — PHENYLEPHRINE HCL-NACL 20-0.9 MG/250ML-% IV SOLN
INTRAVENOUS | Status: DC | PRN
  Administered 2022-06-21: 25 ug/min via INTRAVENOUS

## 2022-06-21 MED ORDER — ROCURONIUM 10MG/ML (10ML) SYRINGE FOR MEDFUSION PUMP - OPTIME
INTRAVENOUS | Status: DC | PRN
  Administered 2022-06-21: 50 mg via INTRAVENOUS

## 2022-06-21 MED ORDER — EPTIFIBATIDE 20 MG/10ML IV SOLN
INTRAVENOUS | Status: AC
  Filled 2022-06-21: qty 10

## 2022-06-21 MED ORDER — CEFAZOLIN SODIUM-DEXTROSE 2-4 GM/100ML-% IV SOLN
INTRAVENOUS | Status: AC
  Filled 2022-06-21: qty 100

## 2022-06-21 MED ORDER — CLOPIDOGREL BISULFATE 300 MG PO TABS
ORAL_TABLET | ORAL | Status: AC
  Filled 2022-06-21: qty 1

## 2022-06-21 MED ORDER — NITROGLYCERIN 1 MG/10 ML FOR IR/CATH LAB
INTRA_ARTERIAL | Status: AC
  Filled 2022-06-21: qty 10

## 2022-06-21 MED ORDER — LACTATED RINGERS IV SOLN: INTRAVENOUS | Status: DC | PRN

## 2022-06-21 MED ORDER — PHENYLEPHRINE HCL (PRESSORS) 10 MG/ML IV SOLN
INTRAVENOUS | Status: DC | PRN
  Administered 2022-06-21: 40 ug via INTRAVENOUS

## 2022-06-21 MED ORDER — SUCCINYLCHOLINE 20MG/ML (10ML) SYRINGE FOR MEDFUSION PUMP - OPTIME
INTRAMUSCULAR | Status: DC | PRN
  Administered 2022-06-21: 160 mg via INTRAVENOUS

## 2022-06-21 MED ORDER — VERAPAMIL HCL 2.5 MG/ML IV SOLN
INTRAVENOUS | Status: AC
  Filled 2022-06-21: qty 2

## 2022-06-21 NOTE — Progress Notes (Signed)
Patient ID: Devin Mercado, male   DOB: 1951/04/26, 71 y.o.   MRN: [id number]  1 Day Post-Op Subjective: S/P cystoscopy with fulguration of bladder/urethra last night.  Only diffuse oozing noted.  Catheter was left out and he has been unable to void since the procedure.  Bladder scan this morning was 500 cc. He is not uncomfortable.  Objective: Vital signs in last 24 hours: Temp:  [97.6 F (36.4 C)-100 F (37.8 C)] 97.6 F (36.4 C) (12/09 0748) Pulse Rate:  [70-101] 80 (12/09 0748) Resp:  [17-22] 19 (12/09 0748) BP: (111-144)/(56-87) 144/78 (12/09 0748) SpO2:  [93 %-100 %] 97 % (12/09 0748)  Intake/Output from previous day: 12/08 0701 - 12/09 0700 In: 3260.6 [I.V.:1577.4; Blood:1338.2; IV Piggyback:100] Out: 425 [Urine:425] Intake/Output this shift: No intake/output data recorded.  Physical Exam:  General: Alert and oriented GU: Scant bloody urine in external purewick device  Lab Results: Recent Labs    06/16/2022 0312 06/19/2022 1702 07/12/2022 0509  HGB 8.8* 8.7* 8.5*  HCT 24.5* 24.8* 23.4*   BMET Recent Labs    06/19/2022 0312 07/03/2022 0509  NA 129* 130*  K 4.2 4.3  CL 97* 98  CO2 19* 19*  GLUCOSE 115* 156*  BUN 29* 30*  CREATININE 1.27* 1.26*  CALCIUM 6.7* 6.4*     Procedure:  18 Fr catheter inserted under sterile conditions with return of 500 cc of tea colored old looking bloody urine.  Irrigation with saline reveals no clot but still darkish red urine.  Catheter removed.   Assessment/Plan: 1) CRPC: On ADT with progressive metastatic prostate cancer s/p treatment with docetaxel, Lutetium-177, and radium-223 all of which are contributory to pancytopenia.  Appreciate Dr. Grier Mitts involvement.  2) Hematuria/radiation cystitis:  This has been previously confirmed earlier in the fall to be due to radiation cystitis and he has been tentatively scheduled for hyperbaric oxygen therapy 12/19.  S/P cysto with fulguration 12/8.  Still with some hematuria based on evaluation  this morning.  Hgb stable and he is clearly benefiting from his platelet transfusions yesterday but this is likely only temporary.  Would try to keep platelets over 50,000 if possible to help bleeding stop.  I will leave his catheter out and let him try to void.  If unable to void, will place a 3 way catheter and discuss with pharmacy the possibility of alum solution irrigation as a next step.   LOS: 9 days   [name] 07/12/2022, 10:39 AM

## 2022-06-21 NOTE — Code Documentation (Addendum)
Called at 2127 by bedside RN d/t concern for stroke. Responded to bedside at 2130. Pt with new onset stroke symptoms. NIH-19, [id number], CBG-198. Pt transported to CT at 2145. CT head-1.New area of hypodensity in the left mid to inferior cerebellum, concerning for acute infarct. 2. 7 mm focus of hyperdensity in the right occipital lobe in an area of prior parieto-occipital infarct, concerning for a small area of hemorrhagic transformation. 3. New area of hypodensity in the left parietal lobe, concerning for additional acute infarct, adjacent to a previously noted area ofinfarct in the left occipital lobe, without evidence of hemorrhagic transformation. CTA-R distal M1 occlusion without evidence of reconstitution or flow in the R MCA territory. TNK not given-recent strokes, R occipital petechial hemorrhage. IR paged out at 2225 and pt transported to IR suite at 2228.

## 2022-06-21 NOTE — Significant Event (Signed)
Rapid Response Event Note   Reason for Call :  Stroke symptoms  Initial Focused Assessment:  Pt lying in bed with eyes closed, in no visible distress. He will answer questions and follow commands with delayed responses. NIH-19 for R sided forced gaze, L hemianopia, L facial droop, L sided weakness, L sensory deficit, dysarthria, and L sided neglect. [id number]. Code Stroke initiated and pt taken for STAT head CT.   T-97.6, HR-80, BP-144/87, RR-18, SpO2-995 on RA  Interventions:  CBG-198 Code Stroke activated-see Code Stroke note for details and plan of care.   Event Summary:   MD Notified:  Dr. Lorrin Goodell, bedside RN to notify PCP of happenings.  Call [place], Gemma Ruan Anderson, RN

## 2022-06-21 NOTE — Anesthesia Procedure Notes (Signed)
Procedure Name: Intubation Date/Time: 06/30/2022 11:05 PM  Performed by: Valetta Fuller, CRNAPre-anesthesia Checklist: Patient identified, Emergency Drugs available, Suction available and Patient being monitored Patient Re-evaluated:Patient Re-evaluated prior to induction Oxygen Delivery Method: Circle system utilized Preoxygenation: Pre-oxygenation with 100% oxygen Induction Type: IV induction, Rapid sequence and Cricoid Pressure applied Laryngoscope Size: Glidescope and 4 Grade View: Grade I Tube type: Oral Tube size: 8.0 mm Number of attempts: 1 Airway Equipment and Method: Stylet Placement Confirmation: ETT inserted through vocal cords under direct vision, positive ETCO2 and breath sounds checked- equal and bilateral Secured at: 24 cm Tube secured with: Tape Dental Injury: Teeth and Oropharynx as per pre-operative assessment

## 2022-06-21 NOTE — Progress Notes (Signed)
PROGRESS NOTE    Devin Mercado  VHQ:[id number] DOB: 10-27-1950 DOA: 06/08/2022 PCP: [name], MD    Brief Narrative:   Devin Mercado is a 71 y.o. male with past medical history significant for prostate cancer with bone metastasis followed by oncology at [facility], anemia associate with bone marrow infiltration, pancytopenia 2/2 bone marrow suppression from Pluvicto who presented to [place] ED on 11/30 with hematuria.  Patient was seen at [facility] urology earlier in the day and underwent bladder irrigation and subsequently when returned home started passing clots and unable to fully empty his bladder with increased pain and distention.  Patient was admitted to the hospital service at [facility] and urology was consulted and patient had Foley catheter placed with continuous bladder irrigation.  06/14/2022 at 9 AM patient developed right upper extremity ataxia and neurology was consulted given findings of CT head concerning for embolic CVA.  Unable to obtain MRI due to pacemaker with pacemaker interrogation notable for A-fib.  Unfortunate not a candidate for anticoagulation given his hematuria, acute blood loss anemia and thrombocytopenia.  Patient was transferred to [facility] for further neurology evaluation.  Assessment & Plan:   Acute CVA, suspicious for embolic Patient developed acute right upper extremity weakness on 12/2, CT with focal hypoattenuation posterior right parietal lobe measuring up to 2.5 cm concerning for acute versus subacute infarct, focal cortical hypoattenuation posterior left occipital lobe also concerning for infarct, suspect cardioembolic source from new onset A-fib.  CTA head/neck with no emergent LVO.  Unable to obtain MRI due to noncompatible PPM.  TTE with LVEF 30% with apical hypokinesis and diffuse hypokinesis.  LDL 98, hemoglobin A1c 5.6.  Patient is not a good long-term anticoagulation candidate due to his symptomatic anemia and  thrombocytopenia; but may be eligible for a Watchman device outpatient.  Neurology now signed off; recommend starting aspirin 81 mg p.o. daily once platelet count stable above 50 K. -- TEE: Pending; platlets need to be greater than 50K -- Atorvastatin 20 g p.o. daily -- Continue monitor on telemetry  Acute urinary retention with hematuria, hemorrhagic cystitis Patient presenting to the ED following a recurrence of blood clots in his urine with recurrent urinary retention.  Underwent Foley catheter placement and continuous bladder irrigation per urology; now catheter has been discontinued.  Patient underwent cystoscopy with fulguration of bladder/urethra on 12/8. -- Urology following, appreciate assistance -- Tamsulosin 0.4 mg p.o. daily -- Continue to monitor CBC daily, monitoring of strict I's and O's, urine output -- If continues with urinary retention/bleeding, urology plans to place a three-way catheter with possible aluminum solution irrigation as neck step  Acute symptomatic blood loss anemia Pancytopenia Patient was transfused 5 unit PRBCs and 10 units of platelets during hospitalization.  Patient was previously on Pluvicto; but was stopped due to severe anemia/thrombocytopenia.  Unclear etiology for continued dropping of platelets, whether it is continued platelet consumption from unclear etiology versus continued marrow suppression from chemotherapy versus progression of underlying malignancy.  Seen by hematology, Dr. Irene Limbo. -- Hgb 7.2>>6.9>8.3>9.0>7.2>6.9>8.8>8.7>8.5 -- Plt 42>>25>53>44>31>21>14>31>30>93>72 -- CBC daily; goal Plt >50K per hematology/urology  Fever of unclear origin Patient continues with intermittent fevers, Tmax 100.0 past 24 hours.  Unclear etiology.  Chest x-ray without acute cardiopulmonary disease process.  Urinalysis unrevealing.  GI panel negative. -- Infectious disease following, appreciate assistance -- Blood Cultures 12/1: no growth x 5 days -- ID ordered  Karius test on 12/6: pending -- TEE pending if platelet count greater than  50 K  Paroxysmal atrial fibrillation Seen by cardiology, started on amiodarone.  CHA2DS2-VASc score = 5, unable to anticoagulate due to ongoing hematuria and thrombocytopenia; but may be a candidate for a Watchman device in the future. -- Amiodarone 200 mg p.o. twice daily -- EP recommended addition of Toprol 25 mg daily when able -- Continue monitor on telemetry -- Outpatient follow-up with electrophysiology  Acute systolic congestive heart failure, new diagnosis; decided TTE with LVEF 30%, regional wall motion normalities. -- Cardiology following, appreciate assistance; plan outpatient ischemic workup possibly -- Cardiology to consider GDMT on discharge  Metastatic prostate cancer to bone Follows with oncology at [facility], Dr. Lamonte [name].  Completed 5 cycles of Pluvicto, but was stopped due to severe anemia/thrombocytopenia.  Next plan to start cabazitaxel.  CT L-spine with no acute osseous injury but notable sclerotic bone lesions throughout the visualized thoracolumbar spine, sacrum, ilium concerning for metastatic disease.  CT abdomen/pelvis notable for progression of sclerotic skeletal metastasis compared to previous CT 2021.  Continue outpatient follow-up with oncology.   Essential hypertension On valsartan 160 mg p.o. daily outpatient, currently on hold.  Blood pressure 134/77 this morning. -- Continue monitor BP closely  Hyperlipidemia -- Atorvastatin 10 mg p.o. daily  Complete heart block s/p PPM Entire patient has a PPM noted new onset A-fib.  CHADS2 Vasc score = 5, unable to anticoagulate with ongoing hematuria and thrombocytopenia.  Euthyroid sick syndrome Elevated TSH but normal T4.  Recommend repeat TSH outpatient 4-6 weeks.  Hyponatremia Continue to encourage increased oral intake, repeat BMP in a.m.  Hypocalcemia Repleted during hospitalization  Hypokalemia Repleted, potassium 4.3 this  morning.  Weakness/debility/deconditioning: -- Continue PT/OT efforts while inpatient -- Plan CIR on discharge   DVT prophylaxis: SCDs Start: 06/13/22 0135    Code Status: Full Code Family Communication: No family present at bedside this morning, updated patient's daughter and spouse present at bedside on multiple occasions yesterday  Disposition Plan:  Level of care: Telemetry Medical Status is: Inpatient Remains inpatient appropriate because: Pending TEE, awaiting specialist sign off then plan CIR on discharge    Consultants:  Cardiology/electrophysiology Neurology Urology, [name] Infectious disease Oncology/hematology, Dr. Irene Limbo  Procedures:  TTE:  TEE: Pending Cystoscopy 12/8, [name]  Antimicrobials:  Ciprofloxacin 10/2 - 10/2   Subjective: Patient seen examined bedside, resting comfortably.  Lying in bed.  Son present.  Platelet count stable this morning at 72 but down from 93 yesterday.  Underwent cystoscopy with fulguration of the bladder/urethra by urology yesterday.  Continues with urinary retention this morning s/p In-N-Out catheterization.  Tmax 100.0 past 24 hours which is improved.  No other specific questions or concerns at this time.  Denies headache, no dizziness, no chills/night sweats, no nausea/vomiting/diarrhea, no chest pain, no palpitations, no dizziness, no abdominal pain, no paresthesias.  No acute events overnight per nursing staff.  Objective: Vitals:   07/13/2022 2220 06/17/2022 0036 06/19/2022 0314 07/06/2022 0748  BP: 128/69 126/69 134/77 (!) 144/78  Pulse: 85 70 81 80  Resp: '20 19 18 19  '$ Temp: 98.4 F (36.9 C) 98.2 F (36.8 C) 97.9 F (36.6 C) 97.6 F (36.4 C)  TempSrc: Oral Oral Oral Oral  SpO2: 97% 100% 96% 97%  Weight:      Height:        Intake/Output Summary (Last 24 hours) at 07/09/2022 1118 Last data filed at 06/28/2022 0400 Gross per 24 hour  Intake 3250.61 ml  Output 200 ml  Net 3050.61 ml   [employer]  05/21/2022  1716 05/30/2022 2359  Weight: 102.1 kg 103 kg    Examination:  Physical Exam: GEN: NAD, alert and oriented x 3, ill in appearance HEENT: NCAT, PERRL, EOMI, sclera clear, MMM PULM: CTAB w/o wheezes/crackles, normal respiratory effort, on room air CV: RRR w/o M/G/R GI: abd soft, NTND, NABS, no R/G/M MSK: no peripheral edema, moves all extremities independently NEURO: CN II-XII intact, sensation to light touch intact PSYCH: normal mood/affect Integumentary: dry/intact, no rashes or wounds    Data Reviewed: I have personally reviewed following labs and imaging studies  CBC: Recent Labs  Lab 06/19/22 0555 06/19/22 2047 06/22/2022 0312 07/13/2022 1702 07/02/2022 0509  WBC 3.3* 3.4* 3.9* 4.9 4.3  NEUTROABS  --  2.3  --   --   --   HGB 7.2* 6.9* 8.8* 8.7* 8.5*  HCT 20.7* 21.0* 24.5* 24.8* 23.4*  MCV 81.5 88.2 81.1 82.9 81.8  PLT 21* 14* 31*  30* 93* 72*   Basic Metabolic Panel: Recent Labs  Lab 06/17/22 0654 06/18/22 0330 06/19/22 0555 06/13/2022 0312 07/08/2022 0509  NA 134* 131* 128* 129* 130*  K 4.1 4.4 4.3 4.2 4.3  CL 107 97* 100 97* 98  CO2 18* 18* 18* 19* 19*  GLUCOSE 102* 106* 116* 115* 156*  BUN 32* 27* 25* 29* 30*  CREATININE 1.11 1.10 1.14 1.27* 1.26*  CALCIUM 7.1* 8.0* 7.0* 6.7* 6.4*   GFR: Estimated Creatinine Clearance: 64.8 mL/min (A) (by C-G formula based on SCr of 1.26 mg/dL (H)). Liver Function Tests: No results for input(s): "AST", "ALT", "ALKPHOS", "BILITOT", "PROT", "ALBUMIN" in the last 168 hours.  No results for input(s): "LIPASE", "AMYLASE" in the last 168 hours.  No results for input(s): "AMMONIA" in the last 168 hours. Coagulation Profile: Recent Labs  Lab 06/16/2022 0312  INR 1.6*  1.5*   Cardiac Enzymes: No results for input(s): "CKTOTAL", "CKMB", "CKMBINDEX", "TROPONINI" in the last 168 hours. BNP (last 3 results) No results for input(s): "PROBNP" in the last 8760 hours. HbA1C: No results for input(s): "HGBA1C" in the last 72  hours. CBG: Recent Labs  Lab 06/14/22 1537 06/17/2022 0316  GLUCAP 172* 153*   Lipid Profile: No results for input(s): "CHOL", "HDL", "LDLCALC", "TRIG", "CHOLHDL", "LDLDIRECT" in the last 72 hours. Thyroid Function Tests: No results for input(s): "TSH", "T4TOTAL", "FREET4", "T3FREE", "THYROIDAB" in the last 72 hours.  Anemia Panel: Recent Labs    06/16/2022 0312  VITAMINB12 392  FOLATE 8.4  FERRITIN >7,500*  TIBC 175*  IRON 56  RETICCTPCT 0.6   Sepsis Labs: Recent Labs  Lab 06/15/22 0814 06/16/22 0715 06/17/22 0654  PROCALCITON 14.08 7.37 4.00    Recent Results (from the past 240 hour(s))  Culture, blood (Routine X 2) w Reflex to ID Panel     Status: None   Collection Time: 06/13/22  7:01 PM   Specimen: BLOOD  Result Value Ref Range Status   Specimen Description   Final    BLOOD LEFT ANTECUBITAL Performed at [facility], [place], [place] [address] [place], [place] [address]    Special Requests   Final    BOTTLES DRAWN AEROBIC ONLY Blood Culture adequate volume Performed at [place] [address], [place], [place] [address]    Culture   Final    NO GROWTH 5 DAYS Performed at [facility], [place] [address], [place], [place] [address]    Report Status 06/18/2022 FINAL  Final  Culture, blood (Routine X 2) w Reflex to ID  Panel     Status: None   Collection Time: 06/13/22  7:04 PM   Specimen: BLOOD  Result Value Ref Range Status   Specimen Description   Final    BLOOD BLOOD RIGHT HAND Performed at [place] [address] [place], [place] [address]    Special Requests   Final    BOTTLES DRAWN AEROBIC ONLY Blood Culture results may not be optimal due to an inadequate volume of blood received in culture bottles Performed at [place] [address] [place], [place] [address]    Culture   Final    NO GROWTH 5 DAYS Performed at [facility], [place] [address], [place],  [place] [address]    Report Status 06/18/2022 FINAL  Final  Gastrointestinal Panel by PCR , Stool     Status: None   Collection Time: 06/16/22  4:01 AM   Specimen: Stool  Result Value Ref Range Status   Campylobacter species NOT DETECTED NOT DETECTED Final   Plesimonas shigelloides NOT DETECTED NOT DETECTED Final   Salmonella species NOT DETECTED NOT DETECTED Final   Yersinia enterocolitica NOT DETECTED NOT DETECTED Final   Vibrio species NOT DETECTED NOT DETECTED Final   Vibrio cholerae NOT DETECTED NOT DETECTED Final   Enteroaggregative E coli (EAEC) NOT DETECTED NOT DETECTED Final   Enteropathogenic E coli (EPEC) NOT DETECTED NOT DETECTED Final   Enterotoxigenic E coli (ETEC) NOT DETECTED NOT DETECTED Final   Shiga like toxin producing E coli (STEC) NOT DETECTED NOT DETECTED Final   Shigella/Enteroinvasive E coli (EIEC) NOT DETECTED NOT DETECTED Final   Cryptosporidium NOT DETECTED NOT DETECTED Final   Cyclospora cayetanensis NOT DETECTED NOT DETECTED Final   Entamoeba histolytica NOT DETECTED NOT DETECTED Final   Giardia lamblia NOT DETECTED NOT DETECTED Final   Adenovirus F40/41 NOT DETECTED NOT DETECTED Final   Astrovirus NOT DETECTED NOT DETECTED Final   Norovirus GI/GII NOT DETECTED NOT DETECTED Final   Rotavirus A NOT DETECTED NOT DETECTED Final   Sapovirus (I, II, IV, and V) NOT DETECTED NOT DETECTED Final    Comment: Performed at [facility], [address] [place], [place] [address]         Radiology Studies: CT ABDOMEN PELVIS W CONTRAST  Result Date: 06/30/2022 CLINICAL DATA:  Metastatic prostate carcinoma bone pain EXAM: CT ABDOMEN AND PELVIS WITH CONTRAST TECHNIQUE: Multidetector CT imaging of the abdomen and pelvis was performed using the standard protocol following bolus administration of intravenous contrast. RADIATION DOSE REDUCTION: This exam was performed according to the departmental dose-optimization program which includes automated exposure control,  adjustment of the mA and/or kV according to patient size and/or use of iterative reconstruction technique. CONTRAST:  25m OMNIPAQUE IOHEXOL 350 MG/ML SOLN COMPARISON:  CT 09/26/2019 bone scan 09/26/2019 FINDINGS: Lower chest: Bilateral layering effusions with lower lobe mild passive atelectasis. Increased bilateral gynecomastia. Hepatobiliary: Benign cyst in the LEFT hepatic lobe. Several gallstones within the lumen the gallbladder. No gallbladder inflammation. Pancreas: Pancreas is normal. No ductal dilatation. No pancreatic inflammation. Spleen: Normal spleen Adrenals/urinary tract: Adrenal glands and kidneys normal. No ureterolithiasis. Bladder is decompressed around a Foley catheter. Stomach/Bowel: Stomach, small bowel, appendix, and cecum are normal. The colon and rectosigmoid colon are normal. Vascular/Lymphatic: Abdominal aorta is normal caliber. No periportal or retroperitoneal adenopathy. No pelvic adenopathy. Reproductive: Post prostatectomy Other: No pelvic lymphadenopathy. Musculoskeletal: Multiple sclerotic lesions in the pelvis and spine. [place]. Sclerotic  lesion at L 1 is similar but he multiple new lesions in the spine and pelvis noted. Example new sclerotic lesion the central sacrum measuring 23 mm on image 68/3. Lesion in the RIGHT iliac wing measuring 12 mm on image 65/3. Approximately 40 lesions noted. IMPRESSION: 1. Progression sclerotic skeletal metastasis compared to CT 09/26/2019. 2. No pelvic or retroperitoneal lymphadenopathy. 3. Increase in gynecomastia compared to prior. 4. Cholelithiasis without evidence cholecystitis. Electronically Signed   By: Suzy Bouchard M.D.   On: 07/06/2022 09:42   CT L-SPINE NO CHARGE  Result Date: 07/08/2022 CLINICAL DATA:  Progressive lumbar pain EXAM: CT LUMBAR SPINE WITHOUT CONTRAST TECHNIQUE: Multidetector CT imaging of the lumbar spine was performed without intravenous contrast administration. Multiplanar CT  image reconstructions were also generated. RADIATION DOSE REDUCTION: This exam was performed according to the departmental dose-optimization program which includes automated exposure control, adjustment of the mA and/or kV according to patient size and/or use of iterative reconstruction technique. COMPARISON:  CT abdomen 09/14/2014 FINDINGS: Segmentation: 5 lumbar type vertebrae. Alignment: Minimal retrolisthesis of L2 on L3 and L3 on L4. Vertebrae: No acute fracture or subluxation. Sclerotic bone lesions throughout the visualized thoracolumbar spine, sacrum, and ilium concerning for metastatic disease. Paraspinal and other soft tissues: No acute paraspinal abnormality. Mild abdominal aortic atherosclerosis. Other: None Disc levels: Disc spaces: Degenerative disease with disc height loss at L3-4. T12-L1: Minimal broad-based disc bulge. Mild bilateral facet arthropathy. No foraminal or central canal stenosis. L1-L2: Mild broad-based disc bulge. Mild bilateral facet arthropathy. No foraminal or central canal stenosis. L2-L3: Broad-based disc bulge. Moderate bilateral facet arthropathy. Mild spinal stenosis. Mild left foraminal stenosis. No right foraminal stenosis. L3-L4: Broad-based disc bulge. Mild bilateral facet arthropathy. Moderate spinal stenosis. Moderate left foraminal stenosis. No right foraminal stenosis. L4-L5: Broad-based disc bulge. Mild bilateral facet arthropathy. Mild bilateral foraminal stenosis, left greater than right. No spinal stenosis. L5-S1: Broad-based disc bulge. Moderate left facet arthropathy. Mild left foraminal stenosis. No right foraminal stenosis. IMPRESSION: 1. No acute osseous injury of the lumbar spine. 2. Sclerotic bone lesions throughout the visualized thoracolumbar spine, sacrum, and ilium concerning for metastatic disease. 3. Diffuse lumbar spine spondylosis as described above. Electronically Signed   By: Kathreen Devoid M.D.   On: 06/15/2022 09:32   DG Abd 1 View  Result Date:  06/19/2022 CLINICAL DATA:  Distension EXAM: ABDOMEN - 1 VIEW COMPARISON:  CT 09/14/2014 FINDINGS: Mild diffuse increased small and large bowel gas. Bilateral hip replacements. Pelvic phleboliths. Amorphous density in the left upper quadrant could represent something in the enteral stream. No definite radiopaque calculi. IMPRESSION: Mild diffuse increased small and large bowel gas suggestive of mild ileus. Electronically Signed   By: Donavan Foil M.D.   On: 06/19/2022 23:28        Scheduled Meds:  sodium chloride   Intravenous Once   amiodarone  200 mg Oral BID   atorvastatin  20 mg Oral Daily   B-complex with vitamin C  1 tablet Oral Daily   famotidine  20 mg Oral Daily   lidocaine  1 patch Transdermal Q24H   predniSONE  10 mg Oral q morning   sodium bicarbonate  650 mg Oral TID   Continuous Infusions:  sodium chloride 50 mL/hr at 06/14/2022 0400     LOS: 9 days    Time spent: 45 minutes spent on chart review, discussion with nursing staff, consultants, updating family and interview/physical exam; more than 50% of that time was spent in counseling and/or coordination of  care.    Josey Dettmann J [place] DO [facility] Available via Epic secure chat 7am-7pm After these hours, please refer to coverage provider listed on [email or web address] 07/03/2022, 11:18 AM

## 2022-06-21 NOTE — Plan of Care (Signed)
  Problem: Pain Managment: Goal: General experience of comfort will improve Outcome: Progressing   Problem: Safety: Goal: Ability to remain free from injury will improve Outcome: Progressing   Problem: Skin Integrity: Goal: Risk for impaired skin integrity will decrease Outcome: Progressing   Problem: Education: Goal: Knowledge of disease or condition will improve Outcome: Progressing Goal: Knowledge of secondary prevention will improve (MUST DOCUMENT ALL) Outcome: Progressing

## 2022-06-21 NOTE — Anesthesia Preprocedure Evaluation (Addendum)
Anesthesia Evaluation  Patient identified by MRN, date of birth, ID band Patient awake    Reviewed: Allergy & Precautions, NPO status , Patient's Chart, lab work & pertinent test results  History of Anesthesia Complications Negative for: history of anesthetic complications  Airway Mallampati: III  TM Distance: >3 FB Neck ROM: Full    Dental  (+) Dental Advisory Given, Missing   Pulmonary neg pulmonary ROS   breath sounds clear to auscultation       Cardiovascular hypertension, Pt. on medications (-) angina + dysrhythmias (complete heart block) Atrial Fibrillation + pacemaker  Rhythm:Irregular Rate:Normal  ECHO 12/23:  1. Difficult acoustic windows limit study Would recomm limited echo with  Definity to confirm LVEF and wall motion. There appears to be hypokinesis  of the mid/distal lateral, mid/distal septal, distal inferior walls and  apical akinesis.. Left ventricular   ejection fraction, by estimation, is 30%. The left ventricle has  moderately decreased function.   2. Right ventricular systolic function is normal. The right ventricular  size is normal. There is normal pulmonary artery systolic pressure.   3. The mitral valve is normal in structure. Trivial mitral valve  regurgitation.   4. The aortic valve is normal in structure. Aortic valve regurgitation is  not visualized.   5. There is mild dilatation of the ascending aorta, measuring 42 mm.   6. The inferior vena cava is normal in size with greater than 50%  respiratory variability, suggesting right atrial pressure of 3 mmHg.       Neuro/Psych CVA (acute CVA, R gaze, L sided weakness), Residual Symptoms    GI/Hepatic negative GI ROS, Neg liver ROS,,,  Endo/Other  negative endocrine ROS    Renal/GU Renal InsufficiencyRenal disease     Musculoskeletal  (+) Arthritis ,    Abdominal  (+) + obese  Peds  Hematology  (+) Blood dyscrasia (Hb 8.5, plt 72k, INR  1.6), anemia Hb 8.5, plt 72k   Anesthesia Other Findings Prostate cancer with bone mets:  metastatic prostate cancer, pancytopenia and radiation cystitis with hematuria requiring blood transfusion  Reproductive/Obstetrics                              Anesthesia Physical Anesthesia Plan  ASA: 4  Anesthesia Plan: General   Post-op Pain Management: Minimal or no pain anticipated   Induction: Intravenous  PONV Risk Score and Plan: 2 and Ondansetron and Treatment may vary due to age or medical condition  Airway Management Planned: Oral ETT and Video Laryngoscope Planned  Additional Equipment: Arterial line  Intra-op Plan:   Post-operative Plan: Possible Post-op intubation/ventilation  Informed Consent: I have reviewed the patients History and Physical, chart, labs and discussed the procedure including the risks, benefits and alternatives for the proposed anesthesia with the patient or authorized representative who has indicated his/her understanding and acceptance.     Dental advisory given and Consent reviewed with POA  Plan Discussed with: CRNA and Surgeon  Anesthesia Plan Comments: (Discussed with patient, pt's wife [facility] internist), and pt's daughter)         Anesthesia Quick Evaluation

## 2022-06-21 NOTE — Progress Notes (Signed)
No UOP since procedure. Bladderscan = 332. Pt denies feeling the urge to urinate or any discomfort. Urology [name] paged for guidance. No new orders. Will monitor pt for discomfort.

## 2022-06-21 NOTE — Anesthesia Procedure Notes (Signed)
Arterial Line Insertion Start/End12/27/2023 11:10 PM, 06/19/2022 11:17 PM Performed by: Valetta Fuller, CRNA, CRNA  Patient location: OOR procedure area. Left, radial was placed Catheter size: 20 G Hand hygiene performed , maximum sterile barriers used  and Seldinger technique used Allen's test indicative of satisfactory collateral circulation Attempts: 2 Procedure performed without using ultrasound guided technique. Following insertion, dressing applied and Biopatch. Post procedure assessment: normal

## 2022-06-21 NOTE — Consult Note (Signed)
NEUROLOGY CONSULTATION NOTE   Date of service: June 21, 2022 Patient Name: Devin Mercado MRN:  [id number] DOB:  1951/03/15 Reason for consult: "Stroke code for R gaze deviation and left hemianopsia and Left facial droop and slurred speech" Requesting Provider: [place] J, DO _ _ _   _ __   _ __ _ _  __ __   _ __   __ _  History of Present Illness  Devin Mercado is a 71 y.o. male with PMH significant for HTN, HLD, PPM, Prostrate cancer with marrow infiltration, pancytopenia, recent L occipital and R parietal infarct who is admitted with urinary retention, embolic strokes likely due to hypercoagulability from cancer, symptomatic blood loss anemia and pancytopenia.  He was conversing with his family normally and then had acute onset R gaze deviation, left sided weakness, left hemianopsia, left facial droop and left sided neglect.  CTH with left cerebellum acute stroke, R occipital stroke that was noted on prior imaging with some petechial blood, new  left parietal lobe, concerning for additional acute infarct, adjacent to a previously noted area of infarct in the left occipital lobe   LKW: 2125 mRS: 3 tNKASE: not offered 2/2 recent strokes and the R occipital petechial hemorrhage. Thrombectomy: Me and [facility] Mercado had extensive discussion with patient's wife over the phone. We discussed risks, benefits, details of thrombectomy along with alternatives. Discussed much higher risk of bleeding given his prior strokes and pancytopenias. At this time, family opted for thrombectomy given this would be significantly disabling for the patient. NIHSS components Score: Comment  1a Level of Conscious 0'[x]'$  1'[]'$  2'[]'$  3'[]'$      1b LOC Questions 0'[x]'$  1'[]'$  2'[]'$       1c LOC Commands 0'[x]'$  1'[]'$  2'[]'$       2 Best Gaze 0'[]'$  1'[]'$  2'[x]'$       3 Visual 0'[]'$  1'[]'$  2'[x]'$  3'[]'$      4 Facial Palsy 0'[]'$  1'[]'$  2'[x]'$  3'[]'$      5a Motor Arm - left 0'[]'$  1'[]'$  2'[]'$  3'[x]'$  4'[]'$  UN'[]'$    5b Motor Arm - Right 0'[x]'$  1'[]'$  2'[]'$  3'[]'$  4'[]'$  UN'[]'$    6a Motor  Leg - Left 0'[]'$  1'[]'$  2'[]'$  3'[x]'$  4'[]'$  UN'[]'$    6b Motor Leg - Right 0'[]'$  1'[x]'$  2'[]'$  3'[]'$  4'[]'$  UN'[]'$    7 Limb Ataxia 0'[]'$  1'[x]'$  2'[]'$  3'[]'$  UN'[]'$     8 Sensory 0'[]'$  1'[]'$  2'[x]'$  UN'[]'$      9 Best Language 0'[x]'$  1'[]'$  2'[]'$  3'[]'$      10 Dysarthria 0'[]'$  1'[x]'$  2'[]'$  UN'[]'$      11 Extinct. and Inattention 0'[]'$  1'[]'$  2'[x]'$       TOTAL: 19     ROS   Deferred given the acuity of the situation.  Past History   Past Medical History:  Diagnosis Date   Arthritis    OA AND PAIN LEFT HIP   CHB (complete heart block) ([facility] Merc[facility])    a. MDT dual chamber pacemaker   HTN (hypertension)    Hyperlipidemia    Presence of permanent cardiac pacemaker    2000   Prostate cancer ([place]) 09/2014   Past Surgical History:  Procedure Laterality Date   ADENOIDECTOMY AS A CHILD     COLONOSCOPY WITH PROPOFOL N/A 12/30/2017   Procedure: COLONOSCOPY WITH PROPOFOL;  Surgeon: [facility] Artist, [facility];  [facility];  Service: Endoscopy;  Laterality: N/A;   CYSTOSCOPY N/A 06/14/2022   Procedure: CYSTOSCOPY WITH CLOT EVCAUATION;  Surgeon: [facility];  [facility]: [facility];  Service: Urology;  Laterality: N/A;   JOINT REPLACEMENT  Right hip replacement Dr. Alvan Mercado 01-05-18   LYMPHADENECTOMY Bilateral 11/06/2014   Procedure: PELVIC LYMPHADENECTOMY;  Surgeon: [facility];  [facility]: [facility];  Service: Urology;  Laterality: Bilateral;   PACEMAKER INSERTION  2000; 2012   MDT dual chamber pacemaker implanted 2000 with gen change 2012   POLYPECTOMY  12/30/2017   Procedure: POLYPECTOMY;  Surgeon: [facility] Artist, Mercado;  [facility];  Service: Endoscopy;;   PROSTATE BIOPSY  09/05/14   REVERSE SHOULDER ARTHROPLASTY Left 10/15/2021   Procedure: REVERSE SHOULDER ARTHROPLASTY;  Surgeon: [facility] [place], Mercado;  [facility]: [facility];  Service: Orthopedics;  Laterality: Left;   ROBOT ASSISTED LAPAROSCOPIC RADICAL PROSTATECTOMY N/A 11/06/2014   Procedure: ROBOTIC ASSISTED LAPAROSCOPIC RADICAL PROSTATECTOMY LEVEL 2;  Surgeon: [facility] Mercado;  [facility]: [facility];  Service:  Urology;  Laterality: N/A;   TOTAL HIP ARTHROPLASTY Left 07/03/2014   Procedure: LEFT TOTAL HIP ARTHROPLASTY ANTERIOR APPROACH;  Surgeon: [facility] Pole, Mercado;  [facility]: [facility];  Service: Orthopedics;  Laterality: Left;   TOTAL HIP ARTHROPLASTY Right 01/05/2018   Procedure: RIGHT TOTAL HIP ARTHROPLASTY ANTERIOR APPROACH;  Surgeon: [facility] Cancel, Mercado;  [facility]: [facility];  Service: Orthopedics;  Laterality: Right;  70 mins   WISDOM TEETH EXTRACTIONS     Family History  Problem Relation Age of Onset   Stroke Mother        multiple. 31.    Heart disease Mother        hx heart valve problem   Prostate cancer Father        39 of prostate cancer   Heart attack Neg Hx    Hypertension Neg Hx    Social History   Socioeconomic History   Marital status: Married    Spouse name: Not on file   Number of children: 4   Years of education: Not on file   Highest education level: Not on file  Occupational History   Occupation: attorney  Tobacco Use   Smoking status: Never   Smokeless tobacco: Never  Vaping Use   Vaping Use: Never used  Substance and Sexual Activity   Alcohol use: Yes    Alcohol/week: 2.0 - 3.0 standard drinks of alcohol    Types: 2 - 3 Standard drinks or equivalent per week    Comment: 5 TO 7 DRINKS A WEEK stopped etoh use pending surgery    Drug use: No   Sexual activity: Yes  Other Topics Concern   Not on file  Social History Narrative   Married to [facility] Mercado 1979. 4 kids (1 lost to auto accident-3 surviving), no grandkids. 1 daughter married in 2016 and works as [facility] Mercado in [employer].       Retired [facility] Mercado       Hobbies: [place], vacation   Social Determinants of Radio broadcast assistant Strain: Not on file  Food Insecurity: No [place] (06/13/2022)   Hunger Vital Sign    Worried About Running Out of Food in the Last Year: Never true    [place] in the Last Year: Never true  Transportation Needs: No Transportation Needs (06/13/2022)   PRAPARE -  Hydrologist (Medical): No    Lack of Transportation (Non-Medical): No  Physical Activity: Not on file  Stress: Not on file  Social Connections: Not on file   No Known Allergies  Medications   Medications Prior to Admission  Medication Sig Dispense Refill Last Dose   acetaminophen (TYLENOL) 325 MG  tablet Take 650 mg by mouth every 6 (six) hours as needed for mild pain or headache.   Past Month   amoxicillin (AMOXIL) 500 MG capsule Take 4 capsules by mouth 1 hour prior to dental appointment for pre-medication. (Patient taking differently: Take 2,000 mg by mouth See admin instructions. Take 2,000 mg by mouth one hour prior to dental appointments for pre-medication) 12 capsule 0 unk   atorvastatin (LIPITOR) 20 MG tablet Take 1 tablet by mouth once daily (Patient taking differently: Take 20 mg by mouth daily.) 90 tablet 3 05/30/2022   calcium carbonate (OS-CAL - DOSED IN MG OF ELEMENTAL CALCIUM) 1250 (500 Ca) MG tablet Take 1 tablet by mouth 2 (two) times a week.   Past Week   denosumab (XGEVA) 120 MG/1.7ML SOLN injection Inject 120 mg into the skin every 2 (two) months.   Past Month   docusate sodium (COLACE) 100 MG capsule Take 100 mg by mouth daily as needed for mild constipation.   Past Month   ferrous sulfate 325 (65 FE) MG tablet Take 325 mg by mouth daily with breakfast.   06/07/2022   HYDROcodone-acetaminophen (NORCO/VICODIN) 5-325 MG tablet Take 1 tablet by mouth at bedtime as needed. 6 tablet 0 unk   leuprolide, 6 Month, (ELIGARD) 45 MG injection Inject 45 mg into the skin every 6 (six) months.      predniSONE (DELTASONE) 10 MG tablet Take 1 tablet (10 mg total) by mouth every morning. 30 tablet 11 06/08/2022   prochlorperazine (COMPAZINE) 10 MG tablet Take 1 tablet (10 mg total) by mouth every 6 (six) hours as needed as needed for nausea or vomiting. 30 tablet 8 unk   traMADol (ULTRAM) 50 MG tablet Take 1 tablet (50 mg total) by mouth every 8 (eight) hours  as needed for Pain for up to 20 days 60 tablet 0 06/10/2022   valsartan (DIOVAN) 160 MG tablet Take 1 tablet (160 mg total) by mouth daily. 90 tablet 3 05/21/2022     Vitals   Vitals:   06/25/2022 1118 07/06/2022 1543 06/29/2022 1932 07/07/2022 2130  BP: 121/74 104/64 129/72 (!) 144/87  Pulse: 77 86 78 77  Resp: '17 20 14 18  '$ Temp: 98 F (36.7 C) 97.9 F (36.6 C) 97.6 F (36.4 C)   TempSrc: Oral Oral Oral   SpO2: 97% 97% 98% 99%  Weight:      Height:         Body mass index is 30.8 kg/m.  Physical Exam   General: Laying comfortably in bed; in no acute distress.  HENT: Normal oropharynx and mucosa. Normal external appearance of ears and nose.  Neck: Supple, no pain or tenderness  CV: No JVD. No peripheral edema.  Pulmonary: Symmetric Chest rise. Normal respiratory effort.  Abdomen: Soft to touch, non-tender.  Ext: No cyanosis, edema, or deformity  Skin: No rash. Normal palpation of skin.   Musculoskeletal: Normal digits and nails by inspection. No clubbing.   Neurologic Examination  Mental status/Cognition: Alert, oriented to self, place, month and year, good attention.  Speech/language: dysarthric speech, fluent, comprehension intact, object naming intact, repetition intact.  Cranial nerves:   CN II Pupils equal and reactive to light, L hemianopsia   CN III,IV,VI Forced R gaze deviation, does not cross midline.   CN V Absent on let face   CN VII L facial droop   CN VIII Turns head toward speech   CN IX & X Normal palatal elevation.   CN XI Head is  turned to the right   CN XII midline tongue protrusion oncommand   Motor:  Muscle bulk: Normal, tone increased in left upper extremity  Mvmt Root Nerve  Muscle Right Left Comments  SA C5/6 Ax Deltoid 5 0   EF C5/6 Mc Biceps 5 0   EE C6/7/8 Rad Triceps 5 0   WF C6/7 Med FCR     WE C7/8 PIN ECU     F Ab C8/T1 U ADM/FDI 5 1   HF L1/2/3 Fem Illopsoas 4 0   KE L2/3/4 Fem Quad 5 0   DF L4/5 D Peron Tib Ant 5 0   PF S1/2 Tibial  Grc/Sol 5 0    Sensation:  Light touch Absent on the left.   Pin prick    Temperature    Vibration   Proprioception    Coordination/Complex Motor:  - Finger to Nose with ataxia on the right, just could be from the fact that he has hemianopsia. - Heel to shin unable to do - Rapid alternating movement are slowed - Gait: deferred for patient safety.  Labs   CBC:  Recent Labs  Lab 06/19/22 2047 06/30/2022 0312 06/14/2022 1702 06/22/2022 0509  WBC 3.4*   < > 4.9 4.3  NEUTROABS 2.3  --   --   --   HGB 6.9*   < > 8.7* 8.5*  HCT 21.0*   < > 24.8* 23.4*  MCV 88.2   < > 82.9 81.8  PLT 14*   < > 93* 72*   < > = values in this interval not displayed.    Basic Metabolic Panel:  Lab Results  Component Value Date   NA 130 (L) 06/29/2022   K 4.3 06/15/2022   CO2 19 (L) 07/09/2022   GLUCOSE 156 (H) 06/24/2022   BUN 30 (H) 06/15/2022   CREATININE 1.26 (H) 06/14/2022   CALCIUM 6.4 (LL) 06/30/2022   GFRNONAA >60 07/03/2022   GFRAA >60 01/06/2018   Lipid Panel:  Lab Results  Component Value Date   LDLCALC 98 05/01/2020   HgbA1c:  Lab Results  Component Value Date   HGBA1C 5.6 05/01/2020   Urine Drug Screen: No results found for: "LABOPIA", "COCAINSCRNUR", "LABBENZ", "AMPHETMU", "THCU", "LABBARB"  Alcohol Level No results found for: "ETH"  CT Head without contrast(Personally reviewed): 1. New area of hypodensity in the left mid to inferior cerebellum, concerning for acute infarct. 2. 7 mm focus of hyperdensity in the right occipital lobe in an area of prior parieto-occipital infarct, concerning for a small area of hemorrhagic transformation. 3. New area of hypodensity in the left parietal lobe, concerning for additional acute infarct, adjacent to a previously noted area of infarct in the left occipital lobe, without evidence of hemorrhagic transformation.   CT angio Head and Neck with contrast(Personally reviewed): 1. Occlusion of the distal right M1, without evidence  of reconstitution or flow in the right MCA territory. 2. No hemodynamically significant stenosis in the neck. 3. Small bilateral pleural effusions with associated atelectasis, new from the prior exam. 4. Redemonstrated multifocal sclerotic lesions in the cervical and upper thoracic spine, consistent with osseous metastatic disease. No evidence of pathologic fracture.   MRI Brain(Personally reviewed): Unable to get MRI Brain.  Impression   [facility]in Mercado is a 71 y.o. male with PMH significant for HTN, HLD, PPM, Prostrate cancer with marrow infiltration, pancytopenia, recent L occipital and R parietal infarct who is admitted with urinary retention, embolic strokes likely due to hypercoagulability from cancer,  symptomatic blood loss anemia and pancytopenia.  He was conversing with his family normally and then had acute onset R gaze deviation, left sided weakness, left hemianopsia, left facial droop and left sided neglect.  Found to have new strokes compared to prior CTH from 6 days ago and has a new R MCA M1 occlusion with poor flow distally.  Recommendations   Right MCA M1 occlusion with new develoing R MCA stroke undergoing thrombectomy along with other embolic strokes and petechial hemorrhage of the earlier noted R occipital stroke: - Admit to Neuro ICU - Frequent Neuro checks per stroke unit protocol - Recommend brain imaging with repeat CTH w/o contrast in AM. - Recommend obtaining TTE - Recommend obtaining Lipid panel with LDL - Please start statin if LDL > 70 - Recommend HbA1c - Antithrombotic - Per Neuro IR for the first 24 hours after thrombectomy - Recommend DVT ppx with SCDs for now. - SBP goal - per Neuro IR for the first 24 hours after thrombectomy. - Recommend Telemetry monitoring for arrythmia - Recommend bedside swallow screen prior to PO intake. - Stroke education booklet - Recommend PT/OT/SLP consult  Acute urinary retention with hematuria, hemorrhagic  cystitis Patient presenting to the ED following a recurrence of blood clots in his urine with recurrent urinary retention.  Underwent Foley catheter placement and continuous bladder irrigation per urology; now catheter has been discontinued.  Patient underwent cystoscopy with fulguration of bladder/urethra on 12/8. -- Urology following, appreciate assistance -- Tamsulosin 0.4 mg p.o. daily -- Continue to monitor CBC daily, monitoring of strict I's and O's, urine output -- If continues with urinary retention/bleeding, urology plans to place a three-way catheter with possible aluminum solution irrigation as neck step   Acute symptomatic blood loss anemia Pancytopenia Patient was transfused 5 unit PRBCs and 10 units of platelets during hospitalization.  Patient was previously on Pluvicto; but was stopped due to severe anemia/thrombocytopenia.  Unclear etiology for continued dropping of platelets, whether it is continued platelet consumption from unclear etiology versus continued marrow suppression from chemotherapy versus progression of underlying malignancy.  Seen by hematology, Dr. Irene Limbo. -- Hgb 7.2>>6.9>8.3>9.0>7.2>6.9>8.8>8.7>8.5 -- Plt 42>>25>53>44>31>21>14>31>30>93>72 -- CBC daily; goal Plt >50K per hematology/urology   Fever of unclear origin Patient continues with intermittent fevers, Tmax 100.0 past 24 hours.  Unclear etiology.  Chest x-ray without acute cardiopulmonary disease process.  Urinalysis unrevealing.  GI panel negative. -- Infectious disease following, appreciate assistance -- Blood Cultures 12/1: no growth x 5 days -- ID ordered Karius test on 12/6: pending -- TEE pending if platelet count greater than 50 K   Paroxysmal atrial fibrillation Seen by cardiology, started on amiodarone.  CHA2DS2-VASc score = 5, unable to anticoagulate due to ongoing hematuria and thrombocytopenia; but may be a candidate for a Watchman device in the future. -- Amiodarone 200 mg p.o. twice daily --  EP recommended addition of Toprol 25 mg daily when able -- Continue monitor on telemetry -- Outpatient follow-up with electrophysiology   Acute systolic congestive heart failure, new diagnosis; decided TTE with LVEF 30%, regional wall motion normalities. -- Cardiology following, appreciate assistance; plan outpatient ischemic workup possibly -- Cardiology to consider GDMT on discharge   Metastatic prostate cancer to bone Follows with oncology at [facility], Dr. Lamonte [name].  Completed 5 cycles of Pluvicto, but was stopped due to severe anemia/thrombocytopenia.  Next plan to start cabazitaxel.  CT L-spine with no acute osseous injury but notable sclerotic bone lesions throughout the visualized thoracolumbar spine, sacrum, ilium concerning for metastatic  disease.  CT abdomen/pelvis notable for progression of sclerotic skeletal metastasis compared to previous CT 2021.  Continue outpatient follow-up with oncology.     Essential hypertension On valsartan 160 mg p.o. daily outpatient, currently on hold.  Blood pressure 134/77 this morning. -- Continue monitor BP closely   Hyperlipidemia -- Atorvastatin 10 mg p.o. daily   Complete heart block s/p PPM Entire patient has a PPM noted new onset A-fib.  CHADS2 Vasc score = 5, unable to anticoagulate with ongoing hematuria and thrombocytopenia.   Euthyroid sick syndrome Elevated TSH but normal T4.  Recommend repeat TSH outpatient 4-6 weeks.   Hyponatremia Continue to encourage increased oral intake, repeat BMP in a.m.   Hypocalcemia Repleted during hospitalization   Hypokalemia Repleted, potassium 4.3 this morning.   Weakness/debility/deconditioning: -- Continue PT/OT efforts while inpatient -- Plan CIR on discharge  This patient is critically ill and at significant risk of neurological worsening, death and care requires constant monitoring of vital signs, hemodynamics,respiratory and cardiac monitoring, neurological assessment, discussion with  family, other specialists and medical decision making of high complexity. I spent 55 minutes of neurocritical care time  in the care of  this patient. This was time spent independent of any time provided by nurse practitioner or PA.  Donnetta Simpers [facility] Pager Number [phone] 07/12/2022  11:43 PM    ______________________________________________________________________   Thank you for the opportunity to take part in the care of this patient. If you have any further questions, please contact the neurology consultation attending.  Signed,  [place] Pager Number [phone] _ _ _   _ __   _ __ _ _  __ __   _ __   __ _

## 2022-06-21 NOTE — Plan of Care (Signed)
  Problem: Education: Goal: Knowledge of General Education information will improve Description: Including pain rating scale, medication(s)/side effects and non-pharmacologic comfort measures Outcome: Progressing   Problem: Health Behavior/Discharge Planning: Goal: Ability to manage health-related needs will improve Outcome: Progressing   Problem: Clinical Measurements: Goal: Ability to maintain clinical measurements within normal limits will improve Outcome: Progressing Goal: Will remain free from infection Outcome: Progressing Goal: Diagnostic test results will improve Outcome: Progressing Goal: Respiratory complications will improve Outcome: Progressing Goal: Cardiovascular complication will be avoided Outcome: Progressing   Problem: Activity: Goal: Risk for activity intolerance will decrease Outcome: Progressing   Problem: Nutrition: Goal: Adequate nutrition will be maintained Outcome: Progressing   Problem: Coping: Goal: Level of anxiety will decrease Outcome: Progressing   Problem: Elimination: Goal: Will not experience complications related to bowel motility Outcome: Progressing Goal: Will not experience complications related to urinary retention Outcome: Progressing   Problem: Pain Managment: Goal: General experience of comfort will improve Outcome: Progressing   Problem: Safety: Goal: Ability to remain free from injury will improve Outcome: Progressing   Problem: Skin Integrity: Goal: Risk for impaired skin integrity will decrease Outcome: Progressing   Problem: Education: Goal: Knowledge of disease or condition will improve Outcome: Progressing Goal: Knowledge of secondary prevention will improve (MUST DOCUMENT ALL) Outcome: Progressing

## 2022-06-21 NOTE — Progress Notes (Addendum)
RN entered room to admin scheduled HS meds. Pt was resting in bed, talking with family and drinking water. Pt noted to start coughing after a sip of water. RN raised HOB and pt was noted to have right-sided gaze deviation, slurred speech and left facial droop. Further assessment revealed left hemiparesis. Rapid response called, vitals and cbg checked. See rapid response note for details.

## 2022-06-22 ENCOUNTER — Inpatient Hospital Stay (HOSPITAL_COMMUNITY): Payer: Medicare PPO

## 2022-06-22 ENCOUNTER — Encounter (HOSPITAL_COMMUNITY): Payer: Self-pay | Admitting: Interventional Radiology

## 2022-06-22 DIAGNOSIS — I639 Cerebral infarction, unspecified: Secondary | ICD-10-CM | POA: Diagnosis not present

## 2022-06-22 DIAGNOSIS — R310 Gross hematuria: Secondary | ICD-10-CM | POA: Diagnosis not present

## 2022-06-22 DIAGNOSIS — G9340 Encephalopathy, unspecified: Secondary | ICD-10-CM | POA: Diagnosis not present

## 2022-06-22 DIAGNOSIS — I6601 Occlusion and stenosis of right middle cerebral artery: Secondary | ICD-10-CM | POA: Diagnosis present

## 2022-06-22 DIAGNOSIS — D61818 Other pancytopenia: Secondary | ICD-10-CM | POA: Diagnosis not present

## 2022-06-22 HISTORY — PX: IR CT HEAD LTD: IMG2386

## 2022-06-22 HISTORY — PX: IR PERCUTANEOUS ART THROMBECTOMY/INFUSION INTRACRANIAL INC DIAG ANGIO: IMG6087

## 2022-06-22 LAB — CBC WITH DIFFERENTIAL/PLATELET
Abs Immature Granulocytes: 0 10*3/uL (ref 0.00–0.07)
Basophils Absolute: 0 10*3/uL (ref 0.0–0.1)
Basophils Relative: 0 %
Eosinophils Absolute: 0 10*3/uL (ref 0.0–0.5)
Eosinophils Relative: 0 %
HCT: 23.6 % — ABNORMAL LOW (ref 39.0–52.0)
Hemoglobin: 7.9 g/dL — ABNORMAL LOW (ref 13.0–17.0)
Lymphocytes Relative: 6 %
Lymphs Abs: 0.3 10*3/uL — ABNORMAL LOW (ref 0.7–4.0)
MCH: 28.3 pg (ref 26.0–34.0)
MCHC: 33.5 g/dL (ref 30.0–36.0)
MCV: 84.6 fL (ref 80.0–100.0)
Monocytes Absolute: 0.4 10*3/uL (ref 0.1–1.0)
Monocytes Relative: 7 %
Neutro Abs: 4.4 10*3/uL (ref 1.7–7.7)
Neutrophils Relative %: 87 %
Platelets: 46 10*3/uL — ABNORMAL LOW (ref 150–400)
RBC: 2.79 MIL/uL — ABNORMAL LOW (ref 4.22–5.81)
RDW: 16.7 % — ABNORMAL HIGH (ref 11.5–15.5)
WBC: 5.1 10*3/uL (ref 4.0–10.5)
nRBC: 0.4 % — ABNORMAL HIGH (ref 0.0–0.2)
nRBC: 2 /100 WBC — ABNORMAL HIGH

## 2022-06-22 LAB — COMPREHENSIVE METABOLIC PANEL
ALT: 23 U/L (ref 0–44)
AST: 43 U/L — ABNORMAL HIGH (ref 15–41)
Albumin: 1.6 g/dL — ABNORMAL LOW (ref 3.5–5.0)
Alkaline Phosphatase: 86 U/L (ref 38–126)
Anion gap: 15 (ref 5–15)
BUN: 41 mg/dL — ABNORMAL HIGH (ref 8–23)
CO2: 15 mmol/L — ABNORMAL LOW (ref 22–32)
Calcium: 5.9 mg/dL — CL (ref 8.9–10.3)
Chloride: 102 mmol/L (ref 98–111)
Creatinine, Ser: 1.25 mg/dL — ABNORMAL HIGH (ref 0.61–1.24)
GFR, Estimated: >60 mL/min (ref >60)
Glucose, Bld: 156 mg/dL — ABNORMAL HIGH (ref 70–99)
Potassium: 4 mmol/L (ref 3.5–5.1)
Sodium: 132 mmol/L — ABNORMAL LOW (ref 135–145)
Total Bilirubin: 0.4 mg/dL (ref 0.3–1.2)
Total Protein: 4.4 g/dL — ABNORMAL LOW (ref 6.5–8.1)

## 2022-06-22 LAB — LIPID PANEL
Cholesterol: 106 mg/dL (ref 0–200)
HDL: 17 mg/dL — ABNORMAL LOW (ref >40)
LDL Cholesterol: 53 mg/dL (ref 0–99)
Total CHOL/HDL Ratio: 6.2 RATIO
Triglycerides: 182 mg/dL — ABNORMAL HIGH (ref <150)
VLDL: 36 mg/dL (ref 0–40)

## 2022-06-22 LAB — GLUCOSE, CAPILLARY
Glucose-Capillary: 162 mg/dL — ABNORMAL HIGH (ref 70–99)
Glucose-Capillary: 141 mg/dL — ABNORMAL HIGH (ref 70–99)
Glucose-Capillary: 118 mg/dL — ABNORMAL HIGH (ref 70–99)
Glucose-Capillary: 140 mg/dL — ABNORMAL HIGH (ref 70–99)

## 2022-06-22 LAB — HAPTOGLOBIN: Haptoglobin: 498 mg/dL — ABNORMAL HIGH (ref 34–355)

## 2022-06-22 LAB — MRSA NEXT GEN BY PCR, NASAL: MRSA by PCR Next Gen: NOT DETECTED

## 2022-06-22 MED ORDER — DEXAMETHASONE SODIUM PHOSPHATE 10 MG/ML IJ SOLN
INTRAMUSCULAR | Status: DC | PRN
  Administered 2022-06-22: 10 mg via INTRAVENOUS

## 2022-06-22 MED ORDER — ACETAMINOPHEN 325 MG PO TABS
650.0000 mg | ORAL_TABLET | ORAL | Status: DC | PRN
  Filled 2022-06-22: qty 2

## 2022-06-22 MED ORDER — CHLORHEXIDINE GLUCONATE CLOTH 2 % EX PADS
6.0000 | MEDICATED_PAD | Freq: Every day | CUTANEOUS | Status: DC
  Administered 2022-06-22 – 2022-06-23 (×2): 6 via TOPICAL

## 2022-06-22 MED ORDER — CALCIUM GLUCONATE-NACL 2-0.675 GM/100ML-% IV SOLN
2.0000 g | Freq: Once | INTRAVENOUS | Status: AC
  Administered 2022-06-22: 2000 mg via INTRAVENOUS
  Filled 2022-06-22: qty 100

## 2022-06-22 MED ORDER — METOPROLOL TARTRATE 5 MG/5ML IV SOLN
INTRAVENOUS | Status: DC | PRN
  Administered 2022-06-22: 2 mg via INTRAVENOUS
  Administered 2022-06-22: 3 mg via INTRAVENOUS

## 2022-06-22 MED ORDER — SUGAMMADEX SODIUM 200 MG/2ML IV SOLN
INTRAVENOUS | Status: DC | PRN
  Administered 2022-06-22: 400 mg via INTRAVENOUS

## 2022-06-22 MED ORDER — LABETALOL HCL 5 MG/ML IV SOLN: 10.0000 mg | INTRAVENOUS | Status: DC

## 2022-06-22 MED ORDER — ONDANSETRON HCL 4 MG/2ML IJ SOLN
INTRAMUSCULAR | Status: DC | PRN
  Administered 2022-06-22: 4 mg via INTRAVENOUS

## 2022-06-22 MED ORDER — SODIUM CHLORIDE 0.9 % IV SOLN: INTRAVENOUS | Status: DC

## 2022-06-22 MED ORDER — CLEVIDIPINE BUTYRATE 0.5 MG/ML IV EMUL
0.0000 mg/h | INTRAVENOUS | Status: DC
  Administered 2022-06-22 (×2): 4 mg/h via INTRAVENOUS
  Administered 2022-06-22: 2 mg/h via INTRAVENOUS
  Filled 2022-06-22 (×2): qty 50

## 2022-06-22 MED ORDER — ORAL CARE MOUTH RINSE: 15.0000 mL | OROMUCOSAL | Status: DC | PRN

## 2022-06-22 MED ORDER — ACETAMINOPHEN 160 MG/5ML PO SOLN: 650.0000 mg | ORAL | Status: DC | PRN

## 2022-06-22 MED ORDER — LABETALOL HCL 5 MG/ML IV SOLN
10.0000 mg | INTRAVENOUS | Status: DC | PRN
  Administered 2022-06-22 (×2): 10 mg via INTRAVENOUS
  Filled 2022-06-22: qty 4

## 2022-06-22 MED ORDER — ACETAMINOPHEN 650 MG RE SUPP
650.0000 mg | RECTAL | Status: DC | PRN
  Administered 2022-06-22: 650 mg via RECTAL
  Filled 2022-06-22: qty 1

## 2022-06-22 MED ORDER — IOHEXOL 300 MG/ML  SOLN
150.0000 mL | Freq: Once | INTRAMUSCULAR | Status: AC | PRN
  Administered 2022-06-22: 70 mL via INTRA_ARTERIAL

## 2022-06-22 MED ORDER — SODIUM CHLORIDE 3 % IV BOLUS
250.0000 mL | Freq: Once | INTRAVENOUS | Status: AC
  Administered 2022-06-23: 250 mL via INTRAVENOUS
  Filled 2022-06-22: qty 500

## 2022-06-22 MED ORDER — SODIUM CHLORIDE 3 % IV SOLN
INTRAVENOUS | Status: DC
  Filled 2022-06-22 (×2): qty 500

## 2022-06-22 MED ORDER — CLEVIDIPINE BUTYRATE 0.5 MG/ML IV EMUL
INTRAVENOUS | Status: AC
  Filled 2022-06-22: qty 50

## 2022-06-22 MED ORDER — LABETALOL HCL 5 MG/ML IV SOLN
INTRAVENOUS | Status: AC
  Filled 2022-06-22: qty 4

## 2022-06-22 NOTE — Anesthesia Postprocedure Evaluation (Signed)
Anesthesia Post Note  Patient: Devin Mercado  Procedure(s) Performed: IR WITH ANESTHESIA     Patient location during evaluation: ICU Anesthesia Type: General Level of consciousness: sedated and patient cooperative Pain management: pain level controlled Vital Signs Assessment: post-procedure vital signs reviewed and stable Respiratory status: nonlabored ventilation, spontaneous breathing, respiratory function stable and patient connected to face mask oxygen Cardiovascular status: stable and blood pressure returned to baseline Postop Assessment: no apparent nausea or vomiting Anesthetic complications: no   No notable events documented.  Last Vitals:  Vitals:   06/26/2022 2235 07/06/2022 2244  BP: 130/79 (!) 136/91  Pulse:    Resp: (!) 22   Temp:    SpO2: 96% 96%    Last Pain:  Vitals:   06/15/2022 2000  TempSrc:   PainSc: 0-No pain                 Zimir Kittleson,E. Chondra Boyde

## 2022-06-22 NOTE — Transfer of Care (Signed)
Immediate Anesthesia Transfer of Care Note  Patient: Devin Mercado  Procedure(s) Performed: IR WITH ANESTHESIA  Patient Location: NICU  Anesthesia Type:General  Level of Consciousness: awake and drowsy  Airway & Oxygen Therapy: Patient connected to face mask oxygen  Post-op Assessment: Report given to RN and Post -op Vital signs reviewed and stable  Post vital signs: Reviewed and stable  Last Vitals:  Vitals Value Taken Time  BP    Temp    Pulse    Resp    SpO2      Last Pain:  Vitals:   06/19/2022 2000  TempSrc:   PainSc: 0-No pain      Patients Stated Pain Goal: 2 (44/92/01 0071)  Complications: No notable events documented.

## 2022-06-22 NOTE — Progress Notes (Signed)
Referring Physician(s): Stroke team   Supervising Physician: Luanne Bras  Patient Status:  [facility] - In-pt  Chief Complaint:  Code Stroke, R MCA occlusion  S/p Rt MCA thrombectomy achieving aTICI 3 revascularization by Dr. Estanislado Pandy 12/9   Subjective:  Patient laying in bed sleeping, visitor at bedside.  Patient easily arouses with verbal cue, able to follow simple commands. A/O to self and place.  Allergies: Patient has no known allergies.  Medications: Prior to Admission medications   Medication Sig Start Date End Date Taking? Authorizing Provider  acetaminophen (TYLENOL) 325 MG tablet Take 650 mg by mouth every 6 (six) hours as needed for mild pain or headache.   Yes [provider]  amoxicillin (AMOXIL) 500 MG capsule Take 4 capsules by mouth 1 hour prior to dental appointment for pre-medication. Patient taking differently: Take 2,000 mg by mouth See admin instructions. Take 2,000 mg by mouth one hour prior to dental appointments for pre-medication 02/17/22  Yes   atorvastatin (LIPITOR) 20 MG tablet Take 1 tablet by mouth once daily Patient taking differently: Take 20 mg by mouth daily. 07/29/21  Yes [name], MD  calcium carbonate (OS-CAL - DOSED IN MG OF ELEMENTAL CALCIUM) 1250 (500 Ca) MG tablet Take 1 tablet by mouth 2 (two) times a week.   Yes [provider]  denosumab (XGEVA) 120 MG/1.7ML SOLN injection Inject 120 mg into the skin every 2 (two) months.   Yes [provider]  docusate sodium (COLACE) 100 MG capsule Take 100 mg by mouth daily as needed for mild constipation.   Yes [provider]  ferrous sulfate 325 (65 FE) MG tablet Take 325 mg by mouth daily with breakfast.   Yes [provider]  HYDROcodone-acetaminophen (NORCO/VICODIN) 5-325 MG tablet Take 1 tablet by mouth at bedtime as needed. 06/11/2022  Yes   leuprolide, 6 Month, (ELIGARD) 45 MG injection Inject 45 mg into the skin every 6 (six) months.   Yes  [provider]  predniSONE (DELTASONE) 10 MG tablet Take 1 tablet (10 mg total) by mouth every morning. 05/01/22  Yes   prochlorperazine (COMPAZINE) 10 MG tablet Take 1 tablet (10 mg total) by mouth every 6 (six) hours as needed as needed for nausea or vomiting. 05/01/22  Yes   traMADol (ULTRAM) 50 MG tablet Take 1 tablet (50 mg total) by mouth every 8 (eight) hours as needed for Pain for up to 20 days 05/27/22  Yes   valsartan (DIOVAN) 160 MG tablet Take 1 tablet (160 mg total) by mouth daily. 10/10/21  Yes [name], MD     Vital Signs: BP 107/64 (BP Location: Right Arm)   Pulse 70   Temp 98 F (36.7 C) (Oral)   Resp 13   Ht 6' (1.829 m)   Wt 227 lb 1.2 oz (103 kg)   SpO2 96%   BMI 30.80 kg/m   Physical Exam Vitals reviewed.  Constitutional:      General: He is not in acute distress.    Appearance: He is not ill-appearing.  HENT:     Head: Normocephalic.  Cardiovascular:     Comments: R DP 2+  Pulmonary:     Effort: Pulmonary effort is normal.  Abdominal:     General: Abdomen is flat.     Palpations: Abdomen is soft.  Skin:    General: Skin is warm and dry.     Coloration: Skin is not jaundiced or pale.     Comments:  Positive dressing on R CFA puncture site. Site is unremarkable with no erythema, edema, tenderness, bleeding or drainage. Minimal amount of old, dry blood noted on the dressing. Dressing otherwise clean, dry, and intact.    Neurological:     Comments: Alert, awake, and oriented to self and place  Speech and comprehension slow but intact Eyes does not cross midline from right to left, left side neglect  Left facial droop  Tongue midline  Motor power at least 3/5 in lower extremities, right hand grip weak, no purposeful movement in left arm yet.        Imaging: CT ANGIO HEAD NECK W WO CM (CODE STROKE)  Result Date: 06/19/2022 CLINICAL DATA:  Stroke suspected EXAM: CT ANGIOGRAPHY HEAD AND NECK TECHNIQUE: Multidetector CT imaging of  the head and neck was performed using the standard protocol during bolus administration of intravenous contrast. Multiplanar CT image reconstructions and MIPs were obtained to evaluate the vascular anatomy. Carotid stenosis measurements (when applicable) are obtained utilizing NASCET criteria, using the distal internal carotid diameter as the denominator. RADIATION DOSE REDUCTION: This exam was performed according to the departmental dose-optimization program which includes automated exposure control, adjustment of the mA and/or kV according to patient size and/or use of iterative reconstruction technique. CONTRAST:  41m OMNIPAQUE IOHEXOL 350 MG/ML SOLN COMPARISON:  CTA head neck 06/14/2022, correlation is also made with same day CT head FINDINGS: CT HEAD FINDINGS For noncontrast findings, please see same day CT head. CTA NECK FINDINGS Aortic arch: Standard branching. Imaged portion shows no evidence of aneurysm or dissection. No significant stenosis of the major arch vessel origins. Right carotid system: No evidence of dissection, occlusion, or hemodynamically significant stenosis (greater than 50%). Left carotid system: No evidence of dissection, occlusion, or hemodynamically significant stenosis (greater than 50%). Vertebral arteries: No evidence of dissection, occlusion, or hemodynamically significant stenosis (greater than 50%). Skeleton: Redemonstrated multifocal sclerotic lesions in cervical and upper thoracic spine. No evidence of pathologic fracture. Other neck: Negative. Upper chest: Small bilateral pleural effusions with associated atelectasis, new from the prior exam. Review of the MIP images confirms the above findings CTA HEAD FINDINGS Anterior circulation: Both internal carotid arteries are patent to the termini, without significant stenosis. A1 segments patent. Normal anterior communicating artery. Anterior cerebral arteries are patent to their distal aspects. Occlusion of the distal right M1  (series 7, image 93), without evidence of reconstitution or flow in the right MCA territory. No left M1 stenosis or occlusion. The left MCA vessels branches are perfused without significant stenosis. Posterior circulation: Vertebral arteries patent to the vertebrobasilar junction without stenosis. Basilar patent to its distal aspect. Superior cerebellar arteries patent proximally. Patent P1 segments. PCAs perfused to their distal aspects without stenosis. The right posterior communicating artery is patent. Venous sinuses: As permitted by contrast timing, patent. Anatomic variants: None significant. Review of the MIP images confirms the above findings IMPRESSION: 1. Occlusion of the distal right M1, without evidence of reconstitution or flow in the right MCA territory. 2. No hemodynamically significant stenosis in the neck. 3. Small bilateral pleural effusions with associated atelectasis, new from the prior exam. 4. Redemonstrated multifocal sclerotic lesions in the cervical and upper thoracic spine, consistent with osseous metastatic disease. No evidence of pathologic fracture. Code stroke imaging results were communicated on 06/13/2022 at 10:10 pm to provider Dr. KLorrin Goodellvia secure text paging. Electronically Signed   By: AMerilyn BabaM.D.   On: 06/13/2022 22:17   CT HEAD CODE STROKE WO CONTRAST  Result Date: 07/09/2022 CLINICAL DATA:  Code stroke. EXAM: CT HEAD WITHOUT CONTRAST TECHNIQUE: Contiguous axial images were obtained from the base of the skull through the vertex without intravenous contrast. RADIATION DOSE REDUCTION: This exam was performed according to the departmental dose-optimization program which includes automated exposure control, adjustment of the mA and/or kV according to patient size and/or use of iterative reconstruction technique. COMPARISON:  06/15/2022 FINDINGS: Brain: New area of hypodensity in the left mid to inferior cerebellum (series 2, image 6 and series 6, image 39), concerning  for acute infarct. 7 mm focus of hyperdensity in the right occipital lobe, associated with an area of hypodensity and loss of gray-white differentiation, concerning for a small area of hemorrhagic transformation. Redemonstrated areas of loss of gray-white differentiation in the left occipital lobe without evidence of hemorrhagic transformation, but a possible additional area of hypodense in the adjacent left parietal lobe (series 2, image 17). No evidence of mass, mass effect, or midline shift. No hydrocephalus or extra-axial collection. Vascular: No hyperdense vessel. Skull: Negative for fracture or focal lesion. Sinuses/Orbits: Mild mucosal thickening in the left maxillary sinus. The orbits are unremarkable. Other: The mastoid air cells are well aerated. ASPECTS ([place] Stroke Program Early CT Score) - Ganglionic level infarction (caudate, lentiform nuclei, internal capsule, insula, M1-M3 cortex): 7 - Supraganglionic infarction (M4-M6 cortex): 3 Total score (0-10 with 10 being normal): 10 IMPRESSION: 1. New area of hypodensity in the left mid to inferior cerebellum, concerning for acute infarct. 2. 7 mm focus of hyperdensity in the right occipital lobe in an area of prior parieto-occipital infarct, concerning for a small area of hemorrhagic transformation. 3. New area of hypodensity in the left parietal lobe, concerning for additional acute infarct, adjacent to a previously noted area of infarct in the left occipital lobe, without evidence of hemorrhagic transformation. Code stroke imaging results were communicated on 07/02/2022 at 10:06 pm to provider Dr. Lorrin Goodell via secure text paging. Electronically Signed   By: Merilyn Baba M.D.   On: 07/10/2022 22:06   CT ABDOMEN PELVIS W CONTRAST  Result Date: 07/09/2022 CLINICAL DATA:  Metastatic prostate carcinoma bone pain EXAM: CT ABDOMEN AND PELVIS WITH CONTRAST TECHNIQUE: Multidetector CT imaging of the abdomen and pelvis was performed using the standard  protocol following bolus administration of intravenous contrast. RADIATION DOSE REDUCTION: This exam was performed according to the departmental dose-optimization program which includes automated exposure control, adjustment of the mA and/or kV according to patient size and/or use of iterative reconstruction technique. CONTRAST:  89m OMNIPAQUE IOHEXOL 350 MG/ML SOLN COMPARISON:  CT 09/26/2019 bone scan 09/26/2019 FINDINGS: Lower chest: Bilateral layering effusions with lower lobe mild passive atelectasis. Increased bilateral gynecomastia. Hepatobiliary: Benign cyst in the LEFT hepatic lobe. Several gallstones within the lumen the gallbladder. No gallbladder inflammation. Pancreas: Pancreas is normal. No ductal dilatation. No pancreatic inflammation. Spleen: Normal spleen Adrenals/urinary tract: Adrenal glands and kidneys normal. No ureterolithiasis. Bladder is decompressed around a Foley catheter. Stomach/Bowel: Stomach, small bowel, appendix, and cecum are normal. The colon and rectosigmoid colon are normal. Vascular/Lymphatic: Abdominal aorta is normal caliber. No periportal or retroperitoneal adenopathy. No pelvic adenopathy. Reproductive: Post prostatectomy Other: No pelvic lymphadenopathy. Musculoskeletal: Multiple sclerotic lesions in the pelvis and spine. The number of lesions is increased from [id number]. Sclerotic lesion at L 1 is similar but he multiple new lesions in the spine and pelvis noted. Example new sclerotic lesion the central sacrum measuring 23 mm on image 68/3. Lesion in the RIGHT iliac wing measuring 12  mm on image 65/3. Approximately 40 lesions noted. IMPRESSION: 1. Progression sclerotic skeletal metastasis compared to CT 09/26/2019. 2. No pelvic or retroperitoneal lymphadenopathy. 3. Increase in gynecomastia compared to prior. 4. Cholelithiasis without evidence cholecystitis. Electronically Signed   By: Suzy Bouchard M.D.   On: 06/28/2022 09:42   CT L-SPINE NO CHARGE  Result Date:  06/16/2022 CLINICAL DATA:  Progressive lumbar pain EXAM: CT LUMBAR SPINE WITHOUT CONTRAST TECHNIQUE: Multidetector CT imaging of the lumbar spine was performed without intravenous contrast administration. Multiplanar CT image reconstructions were also generated. RADIATION DOSE REDUCTION: This exam was performed according to the departmental dose-optimization program which includes automated exposure control, adjustment of the mA and/or kV according to patient size and/or use of iterative reconstruction technique. COMPARISON:  CT abdomen 09/14/2014 FINDINGS: Segmentation: 5 lumbar type vertebrae. Alignment: Minimal retrolisthesis of L2 on L3 and L3 on L4. Vertebrae: No acute fracture or subluxation. Sclerotic bone lesions throughout the visualized thoracolumbar spine, sacrum, and ilium concerning for metastatic disease. Paraspinal and other soft tissues: No acute paraspinal abnormality. Mild abdominal aortic atherosclerosis. Other: None Disc levels: Disc spaces: Degenerative disease with disc height loss at L3-4. T12-L1: Minimal broad-based disc bulge. Mild bilateral facet arthropathy. No foraminal or central canal stenosis. L1-L2: Mild broad-based disc bulge. Mild bilateral facet arthropathy. No foraminal or central canal stenosis. L2-L3: Broad-based disc bulge. Moderate bilateral facet arthropathy. Mild spinal stenosis. Mild left foraminal stenosis. No right foraminal stenosis. L3-L4: Broad-based disc bulge. Mild bilateral facet arthropathy. Moderate spinal stenosis. Moderate left foraminal stenosis. No right foraminal stenosis. L4-L5: Broad-based disc bulge. Mild bilateral facet arthropathy. Mild bilateral foraminal stenosis, left greater than right. No spinal stenosis. L5-S1: Broad-based disc bulge. Moderate left facet arthropathy. Mild left foraminal stenosis. No right foraminal stenosis. IMPRESSION: 1. No acute osseous injury of the lumbar spine. 2. Sclerotic bone lesions throughout the visualized thoracolumbar  spine, sacrum, and ilium concerning for metastatic disease. 3. Diffuse lumbar spine spondylosis as described above. Electronically Signed   By: Kathreen Devoid M.D.   On: 06/26/2022 09:32   DG Abd 1 View  Result Date: 06/19/2022 CLINICAL DATA:  Distension EXAM: ABDOMEN - 1 VIEW COMPARISON:  CT 09/14/2014 FINDINGS: Mild diffuse increased small and large bowel gas. Bilateral hip replacements. Pelvic phleboliths. Amorphous density in the left upper quadrant could represent something in the enteral stream. No definite radiopaque calculi. IMPRESSION: Mild diffuse increased small and large bowel gas suggestive of mild ileus. Electronically Signed   By: Donavan Foil M.D.   On: 06/19/2022 23:28    Labs:  CBC: Recent Labs    06/16/2022 0312 06/29/2022 1702 06/22/2022 0509 06/22/22 0642  WBC 3.9* 4.9 4.3 5.1  HGB 8.8* 8.7* 8.5* 7.9*  HCT 24.5* 24.8* 23.4* 23.6*  PLT 31*  30* 93* 72* 46*    COAGS: Recent Labs    06/13/22 1901 07/01/2022 0312  INR 1.7* 1.6*  1.5*  APTT  --  30    BMP: Recent Labs    06/19/22 0555 06/28/2022 0312 06/14/2022 0509 06/22/22 0642  NA 128* 129* 130* PENDING  K 4.3 4.2 4.3 PENDING  CL 100 97* 98 PENDING  CO2 18* 19* 19* PENDING  GLUCOSE 116* 115* 156* PENDING  BUN 25* 29* 30* PENDING  CALCIUM 7.0* 6.7* 6.4* PENDING  CREATININE 1.14 1.27* 1.26* PENDING  GFRNONAA >60 60* >60 PENDING    LIVER FUNCTION TESTS: Recent Labs    03/27/22 1424 04/24/22 1008 05/24/2022 1720 06/22/22 0642  BILITOT 0.6 0.5 0.6 PENDING  AST  $'13 14 18 'j$ PENDING  ALT '10 16 11 '$ PENDING  ALKPHOS 63 78 58 PENDING  PROT 6.1 6.5 6.4* PENDING  ALBUMIN 3.8 3.9 3.6 PENDING    Assessment and Plan:  71 y.o. male with recent L occipital and R parietal infarct  who developed R gaze deviation, left sided weakness, left hemianopsia, left facial droop and left sided neglect on 12/9, found to have occluded R MCA, s/p thrombectomy by Dr. Estanislado Pandy on 12/9.    R CFA puncture site stable, No appreciable  pseudoaneurysm. R DP 2 + Speech slow but intact, able to follow commands, a/o to self and place Persistent Left facial droop, left neglect, no purposeful movement in left arm yet.  LE motor power at least 3/5 bilaterally, right arm at least 3/5 but right hand grip weak, unable to make a fist.    Further treatment plan per Stroke Team Appreciate and agree with the plan.  NIR to follow.    Electronically Signed: Tera Mater, PA-C 06/22/2022, 8:42 AM   I spent a total of 15 Minutes at the the patient's bedside AND on the patient's hospital floor or unit, greater than 50% of which was counseling/coordinating care for stroke f/u.   This chart was dictated using voice recognition software.  Despite best efforts to proofread,  errors can occur which can change the documentation meaning.

## 2022-06-22 NOTE — Progress Notes (Signed)
   06/22/22 2300  NIH Stroke Scale ( + Modified Stroke Scale Criteria)   Interval Neuro change  Level of Consciousness (1a.)    2  LOC Questions (1b. )   + 2  LOC Commands (1c. )   +  2  Best Gaze (2. )  + 2  Visual (3. )  + 2  Facial Palsy (4. )     2  Motor Arm, Left (5a. )   + 3  Motor Arm, Right (5b. )   + 0  Motor Leg, Left (6a. )   + 3  Motor Leg, Right (6b. )   + 2  Limb Ataxia (7. ) 0  Sensory (8. )   + 2  Best Language (9. )   + 3  Dysarthria (10. ) 1  Extinction/Inattention (11.)   + 2  Modified SS Total  + 23  Complete NIHSS TOTAL 28   Patient more somnolent, diaphoretic, and unable to remain alert or follow commands. Tachypnea and pupillary change noted and charter for 2300. Vital signs remain intact (+ tachypnea) Neuro paged, STAT CT ordered and performed. Awaiting neuro MD to call back with intervention.

## 2022-06-22 NOTE — Procedures (Signed)
History: 71 yo M with r MCA infarct, evaluate for seizure  Sedation: none  Technique: This EEG was acquired with electrodes placed according to the International 10-20 electrode system (including Fp1, Fp2, F3, F4, C3, C4, P3, P4, O1, O2, T3, T4, T5, T6, A1, A2, Fz, Cz, Pz). The following electrodes were missing or displaced: none.   Background: There is a poorly formed and poorly sustained posterior dominant rhythm of 7 to 8 Hz which is better seen on the left than right.  In addition, there is attenuation of normal faster frequencies on the right compared to the left.  There is also mild generalized irregular slow activity that is diffusely distributed.  Photic stimulation: Physiologic driving is not performed  EEG Abnormalities: 1) asymmetric fast activity with attenuation of the right 2) slow posterior dominant rhythm 3) generalized irregular slow activity  Clinical Interpretation: This EEG is consistent with a focal cerebral dysfunction of the right hemisphere consistent with patient's known stroke in the setting of a more generalized nonspecific cerebral dysfunction (encephalopathy).   There was no seizure or seizure predisposition recorded on this study. Please note that lack of epileptiform activity on EEG does not preclude the possibility of epilepsy.   Roland Rack, MD [facility] [phone]  If 7pm- 7am, please page neurology on call as listed in [place].

## 2022-06-22 NOTE — Progress Notes (Signed)
MD paged around 1630 because patient was no longer as responsive as he was previously. He was now only moving his left upper extremity to pain, and stated his left side felt different than it did before. STAT head CT ordered and completed. MD aware of completed CT. Follow up CT ordered for 0500 on 12/11.   Normajean Baxter, RN

## 2022-06-22 NOTE — Progress Notes (Addendum)
EEG complete - results pending 

## 2022-06-22 NOTE — Procedures (Signed)
INR.  Status post right common carotid arteriogram. Right CFA approach. Findings. 1.  Occluded right middle cerebral artery mid M1 segment. Status post complete revascularization of occluded right middle cerebral M1 segment, with 1 pass with contact aspiration, and 1 pass with contact aspiration and a 3 mm x 20 mm Solitaire X retrieval achieving aTICI 3 revascularization. Post CT brain demonstrates no evidence of hemorrhagic complications. 8 French Angio-Seal closure device deployed at the right groin puncture site for hemostasis. Distal pulses all intact. Extubated.  Slowly waking up.  Pupils are 3 mm bilaterally equal  though sluggish.  No gross facial asymmetry.  Patient attempted to move both lower extremities in the right upper extremity.  No significant movement noted in the left upper extremity Arlean Hopping MD

## 2022-06-22 NOTE — Evaluation (Signed)
Clinical/Bedside Swallow Evaluation Patient Details  Name: Devin Mercado MRN: [id number] Date of Birth: 02-07-1951  Today's Date: 06/22/2022 Time: SLP Start Time (ACUTE ONLY): 1203 SLP Stop Time (ACUTE ONLY): 1220 SLP Time Calculation (min) (ACUTE ONLY): 17 min  Past Medical History:  Past Medical History:  Diagnosis Date   Arthritis    OA AND PAIN LEFT HIP   CHB (complete heart block) ([place])    a. MDT dual chamber pacemaker   HTN (hypertension)    Hyperlipidemia    Presence of permanent cardiac pacemaker    2000   Prostate cancer ([place]) 09/2014   Past Surgical History:  Past Surgical History:  Procedure Laterality Date   ADENOIDECTOMY AS A CHILD     COLONOSCOPY WITH PROPOFOL N/A 12/30/2017   Procedure: COLONOSCOPY WITH PROPOFOL;  Surgeon: Ladene Artist, MD;  [facility];  Service: Endoscopy;  Laterality: N/A;   CYSTOSCOPY N/A 06/15/2022   Procedure: CYSTOSCOPY WITH CLOT EVCAUATION;  Surgeon: [name], MD;  Location: [facility];  Service: Urology;  Laterality: N/A;   JOINT REPLACEMENT     Right hip replacement Dr. Alvan Dame 01-05-18   LYMPHADENECTOMY Bilateral 11/06/2014   Procedure: PELVIC LYMPHADENECTOMY;  Surgeon: [name], MD;  [facility];  Service: Urology;  Laterality: Bilateral;   PACEMAKER INSERTION  2000; 2012   MDT dual chamber pacemaker implanted 2000 with gen change 2012   POLYPECTOMY  12/30/2017   Procedure: POLYPECTOMY;  Surgeon: Ladene Artist, MD;  [facility];  Service: Endoscopy;;   PROSTATE BIOPSY  09/05/14   REVERSE SHOULDER ARTHROPLASTY Left 10/15/2021   Procedure: REVERSE SHOULDER ARTHROPLASTY;  Surgeon: Justice Britain, MD;  [facility];  Service: Orthopedics;  Laterality: Left;   ROBOT ASSISTED LAPAROSCOPIC RADICAL PROSTATECTOMY N/A 11/06/2014   Procedure: ROBOTIC ASSISTED LAPAROSCOPIC RADICAL PROSTATECTOMY LEVEL 2;  Surgeon: [name], MD;  [facility];  Service: Urology;  Laterality: N/A;   TOTAL HIP ARTHROPLASTY  Left 07/03/2014   Procedure: LEFT TOTAL HIP ARTHROPLASTY ANTERIOR APPROACH;  Surgeon: [name], MD;  [facility];  Service: Orthopedics;  Laterality: Left;   TOTAL HIP ARTHROPLASTY Right 01/05/2018   Procedure: RIGHT TOTAL HIP ARTHROPLASTY ANTERIOR APPROACH;  Surgeon: Paralee Cancel, MD;  [facility];  Service: Orthopedics;  Laterality: Right;  70 mins   WISDOM TEETH EXTRACTIONS     HPI:  Pt is a 71 year old male who presented with hematuria. During work up for hematuria pt noted to have RUE weakness. CT revealed: infarcts involving the posterior right parietal lobe and  left occipital lobe. Pt passed the [facility] 12/10, but then demonstrated coughing with meds. PHM: prostate cancer with bone mets, anemia, pancytopenia. L Reverse TSA April 2023    Assessment / Plan / Recommendation  Clinical Impression  Pt was seen for bedside swallow evaluation with his wife present who denied the pt having a history of dysphagia prior to admission. Oral mechanism exam was limited due to pt's difficulty following commands; however, left-sided facial weakness and twitching were noted. He presented with symptoms of oropharyngeal dysphagia characterized by prolonged mastication, a suspected pharyngeal delay, and signs of aspiration with thin liquids. A dysphagia 3 diet with nectar thick liquids is recommended at this time. SLP will follow to ensure tolerance, and for modified barium swallow study. SLP Visit Diagnosis: Dysphagia, unspecified (R13.10)    Aspiration Risk  Mild aspiration risk    Diet Recommendation Dysphagia 3 (Mech soft);Nectar-thick liquid   Liquid Administration via: Cup;Straw Medication Administration: Whole  meds with puree Supervision: Staff to assist with self feeding Compensations: Slow rate;Small sips/bites;Minimize environmental distractions Postural Changes: Seated upright at 90 degrees    Other  Recommendations Oral Care Recommendations: Oral care BID    Recommendations for  follow up therapy are one component of a multi-disciplinary discharge planning process, led by the attending physician.  Recommendations may be updated based on patient status, additional functional criteria and insurance authorization.  Follow up Recommendations  (Continued SLP services at level of care recommended by PT/OT)      Assistance Recommended at Discharge    Functional Status Assessment Patient has had a recent decline in their functional status and demonstrates the ability to make significant improvements in function in a reasonable and predictable amount of time.  Frequency and Duration min 2x/week  2 weeks       Prognosis Prognosis for Safe Diet Advancement: Good Barriers to Reach Goals: Cognitive deficits      Swallow Study   General Date of Onset: 06/22/2022 HPI: Pt is a 71 year old male who presented with hematuria. During work up for hematuria pt noted to have RUE weakness. CT revealed: infarcts involving the posterior right parietal lobe and  left occipital lobe. Pt passed the [facility] 12/10, but then demonstrated coughing with meds. PHM: prostate cancer with bone mets, anemia, pancytopenia. L Reverse TSA April 2023 Type of Study: Bedside Swallow Evaluation Previous Swallow Assessment: none Diet Prior to this Study: Thin liquids (clear liquids) Temperature Spikes Noted: No Respiratory Status: Room air History of Recent Intubation: No Behavior/Cognition: Alert;Cooperative;Pleasant mood Oral Cavity Assessment: Within Functional Limits Oral Care Completed by SLP: No Oral Cavity - Dentition: Adequate natural dentition Vision: Functional for self-feeding Self-Feeding Abilities: Needs assist Patient Positioning: Upright in bed;Postural control adequate for testing Baseline Vocal Quality: Normal Volitional Cough: Strong Volitional Swallow: Able to elicit    Oral/Motor/Sensory Function Overall Oral Motor/Sensory Function:  (Difficult to assess; left facial weakness and  twitching noted)   Ice Chips Ice chips: Within functional limits Presentation: Spoon   Thin Liquid Thin Liquid: Impaired Presentation: Cup;Straw Pharyngeal  Phase Impairments: Throat Clearing - Immediate;Suspected delayed Swallow;Multiple swallows    Nectar Thick Nectar Thick Liquid: Impaired Presentation: Straw;Cup Pharyngeal Phase Impairments: Multiple swallows;Suspected delayed Swallow   Honey Thick Honey Thick Liquid: Not tested   Puree Puree: Impaired Presentation: Spoon Pharyngeal Phase Impairments: Suspected delayed Swallow   Solid     Solid: Impaired Oral Phase Impairments: Impaired mastication     Devin Mercado Devin Mercado, [place], [place] Office number [phone]  Horton Marshall 06/22/2022,1:40 PM

## 2022-06-22 NOTE — Progress Notes (Signed)
Pharmacy Electrolyte Replacement  Recent Labs:  Recent Labs    06/22/22 0642  K 4.0  CREATININE 1.25*    Low Critical Values (K </= 2.5, Phos </= 1, Mg </= 1) Present: None, Ca 5.9 >> corrected calcium for albumin 1.6 = 7.8  MD Contacted: Xu  Plan: - Give calcium gluconate 2g IV x1  Dimple Nanas, PharmD, BCPS 06/22/2022 11:18 AM

## 2022-06-22 NOTE — Evaluation (Signed)
Speech Language Pathology Evaluation Patient Details Name: Devin Mercado MRN: [id number] DOB: December 16, 1950 Today's Date: 06/22/2022 Time: 8119-1478 SLP Time Calculation (min) (ACUTE ONLY): 22 min  Problem List:  Patient Active Problem List   Diagnosis Date Noted   Middle cerebral artery embolism, right 06/22/2022   Acute right MCA stroke ([place]) 07/09/2022   FUO (fever of unknown origin) 06/17/2022   Cerebrovascular accident (CVA) due to embolism of left posterior cerebral artery ([place]) 06/16/2022   Hematuria 06/13/2022   Hyponatremia 06/13/2022   Hypokalemia 06/13/2022   Pancytopenia ([place]) 06/13/2022   Acute urinary retention 05/17/2022   Erectile dysfunction 05/29/2022   PSA elevation 05/29/2022   Symptomatic anemia 05/05/2022   Bladder outlet obstruction 05/01/2022   S/P reverse total shoulder arthroplasty, left 10/15/2021   COVID-19 07/11/2021   Gross hematuria 12/14/2020   Malignant neoplasm metastatic to bone ([place]) 05/07/2020   History of adenomatous polyp of colon 04/15/2019   Palpitations 03/15/2019   Encounter for monitoring androgen deprivation therapy 04/01/2018   S/P right THA, AA 01/05/2018   Positive colorectal cancer screening using Cologuard test    Hyperglycemia 06/12/2015   Prostate cancer ([place]) 09/12/2014   Obese 07/04/2014   S/P left THA, AA 07/03/2014   PPM-[employer]tr[employer] 05/23/2010   Hyperlipidemia 03/28/2009   Atrioventricular block, complete ([place]) 03/28/2009   Essential hypertension 03/16/2008   Past Medical History:  Past Medical History:  Diagnosis Date   Arthritis    OA AND PAIN LEFT HIP   CHB (complete heart block) ([place])    a. MDT dual chamber pacemaker   HTN (hypertension)    Hyperlipidemia    Presence of permanent cardiac pacemaker    2000   Prostate cancer ([place]) 09/2014   Past Surgical History:  Past Surgical History:  Procedure Laterality Date   ADENOIDECTOMY AS A CHILD     COLONOSCOPY WITH PROPOFOL N/A 12/30/2017   Procedure:  COLONOSCOPY WITH PROPOFOL;  Surgeon: Ladene Artist, MD;  [facility];  Service: Endoscopy;  Laterality: N/A;   CYSTOSCOPY N/A 06/17/2022   Procedure: CYSTOSCOPY WITH CLOT EVCAUATION;  Surgeon: [name], MD;  Location: [facility];  Service: Urology;  Laterality: N/A;   JOINT REPLACEMENT     Right hip replacement Dr. Alvan Dame 01-05-18   LYMPHADENECTOMY Bilateral 11/06/2014   Procedure: PELVIC LYMPHADENECTOMY;  Surgeon: [name], MD;  [facility];  Service: Urology;  Laterality: Bilateral;   PACEMAKER INSERTION  2000; 2012   MDT dual chamber pacemaker implanted 2000 with gen change 2012   POLYPECTOMY  12/30/2017   Procedure: POLYPECTOMY;  Surgeon: Ladene Artist, MD;  [facility];  Service: Endoscopy;;   PROSTATE BIOPSY  09/05/14   REVERSE SHOULDER ARTHROPLASTY Left 10/15/2021   Procedure: REVERSE SHOULDER ARTHROPLASTY;  Surgeon: Justice Britain, MD;  [facility];  Service: Orthopedics;  Laterality: Left;   ROBOT ASSISTED LAPAROSCOPIC RADICAL PROSTATECTOMY N/A 11/06/2014   Procedure: ROBOTIC ASSISTED LAPAROSCOPIC RADICAL PROSTATECTOMY LEVEL 2;  Surgeon: [name], MD;  [facility];  Service: Urology;  Laterality: N/A;   TOTAL HIP ARTHROPLASTY Left 07/03/2014   Procedure: LEFT TOTAL HIP ARTHROPLASTY ANTERIOR APPROACH;  Surgeon: [name], MD;  [facility];  Service: Orthopedics;  Laterality: Left;   TOTAL HIP ARTHROPLASTY Right 01/05/2018   Procedure: RIGHT TOTAL HIP ARTHROPLASTY ANTERIOR APPROACH;  Surgeon: Paralee Cancel, MD;  [facility];  Service: Orthopedics;  Laterality: Right;  70 mins   WISDOM TEETH EXTRACTIONS     HPI:  Pt is a  71 year old male who presented with hematuria. During work up for hematuria pt noted to have RUE weakness. CT revealed: infarcts involving the posterior right parietal lobe and  left occipital lobe. Pt passed the [facility] 12/10, but then demonstrated coughing with meds. PHM: prostate cancer with bone mets, anemia,  pancytopenia. L Reverse TSA April 2023   Assessment / Plan / Recommendation Clinical Impression  Pt participated in speech-language-cognition evaluation with his wife present. Pt is a retired Forensic psychologist and baseline difficulty with speech, language, or cognition was denied. Pt's wife reported that his processing speed is now slow and that his speech is "slurred". [facility] Mental Status Examination was completed to evaluate the pt's cognitive-linguistic skills. His score was adjusted to exclude portions of the evaluation that required writing. He achieved an adjusted score of 11/24 which is below the normal limits. He exhibited difficulty in the areas of awareness, attention, memory, and complex problem solving. He also presented with mild dysarthria characterized by reduction in articulatory precision and vocal intensity which slightly impacted speech intelligibility at the conversational level. Skilled SLP services are clinically indicated at this time to improve motor speech and cognitive-linguistic function.    SLP Assessment  SLP Recommendation/Assessment: Patient needs continued Speech Lanaguage Pathology Services SLP Visit Diagnosis: Cognitive communication deficit (R41.841);Dysarthria and anarthria (R47.1)    Recommendations for follow up therapy are one component of a multi-disciplinary discharge planning process, led by the attending physician.  Recommendations may be updated based on patient status, additional functional criteria and insurance authorization.    Follow Up Recommendations  Acute inpatient rehab (3hours/day)    Assistance Recommended at Discharge  Frequent or constant Supervision/Assistance  Functional Status Assessment Patient has had a recent decline in their functional status and demonstrates the ability to make significant improvements in function in a reasonable and predictable amount of time.  Frequency and Duration min 2x/week  2 weeks      SLP  Evaluation Cognition  Overall Cognitive Status: Impaired/Different from baseline Arousal/Alertness: Awake/alert Orientation Level: Oriented X4 Year: 2023 Month: December Attention: Focused;Selective;Sustained Focused Attention: Appears intact Sustained Attention: Impaired Sustained Attention Impairment: Verbal complex Selective Attention: Impaired Selective Attention Impairment: Verbal basic Memory: Impaired Memory Impairment: Decreased recall of new information (Immediate: 5/5; delayed: 1/5; with cues: 4/4) Awareness: Impaired Awareness Impairment: Emergent impairment Problem Solving: Impaired Problem Solving Impairment: Verbal complex (Time: 1/1 with repetition; money: 1/3 with repetition) Executive Function: Sequencing;Organizing;Reasoning Reasoning: Impaired Reasoning Impairment: Verbal complex Organizing: Impaired Organizing Impairment: Verbal complex (backward digit span: 0/3)       Comprehension  Auditory Comprehension Overall Auditory Comprehension: Appears within functional limits for tasks assessed Yes/No Questions: Within Functional Limits Commands: Impaired One Step Basic Commands: 50-74% accurate Interfering Components: Working memory;Processing speed;Attention    Expression Expression Primary Mode of Expression: Verbal Verbal Expression Initiation: No impairment Level of Generative/Spontaneous Verbalization: Conversation Repetition: No impairment Naming: No impairment Interfering Components: Attention   Oral / Motor  Oral Motor/Sensory Function Overall Oral Motor/Sensory Function:  (Difficult to assess; left facial weakness and twitching noted) Motor Speech Overall Motor Speech: Impaired Respiration: Within functional limits Phonation: Low vocal intensity Resonance: Within functional limits Articulation: Impaired Level of Impairment: Conversation Intelligibility: Intelligibility reduced Word: 75-100% accurate Phrase: 75-100% accurate Sentence:  75-100% accurate Conversation: 75-100% accurate Motor Planning: Witnin functional limits Motor Speech Errors: Not applicable           Toryn Mcclinton I. Hardin Negus, [place], [place] Office number [phone]  Horton Marshall 06/22/2022, 2:04 PM

## 2022-06-22 NOTE — Evaluation (Signed)
Occupational Therapy Re-Evaluation Patient Details Name: Devin Mercado MRN: [id number] DOB: 23-Feb-1951 Today's Date: 06/22/2022   History of Present Illness This is a 71 year old male presents with hematuria. 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 On 12/9 code stroke was called for change in symptoms. Pt found to have a R MCA occlusion. Pt underwent R MCA thrombectomy on 12/9. PHMx: prostate cancer with bone mets, anemia, pancytopenia. L Reverse TSA April 2023   Clinical Impression   Pt with significant changes in function with new R MCA occlusion and subsequent revascularization. Pt with lethargy, although awake and oriented. He is dependent in all ADLs and currently requires +2 total assist for all mobility. He demonstrates zero sitting balance, maintains head and eyes turned towards right. Pt able to squeeze OTs hand on R and complete hand toward nose, no movement elicited in L UE. Pt with stable VS throughout session. He will need intensive rehab when medically stable. Recommending AIR.     Recommendations for follow up therapy are one component of a multi-disciplinary discharge planning process, led by the attending physician.  Recommendations may be updated based on patient status, additional functional criteria and insurance authorization.   Follow Up Recommendations  Acute inpatient rehab (3hours/day)     Assistance Recommended at Discharge Frequent or constant Supervision/Assistance  Patient can return home with the following Two people to help with walking and/or transfers;Two people to help with bathing/dressing/bathroom;Assistance with feeding;Direct supervision/assist for medications management;Direct supervision/assist for financial management;Assist for transportation;Help with stairs or ramp for entrance    Functional Status Assessment  Patient has had a  recent decline in their functional status and/or demonstrates limited ability to make significant improvements in function in a reasonable and predictable amount of time  Equipment Recommendations  Other (comment) (defer to next venue)    Recommendations for Other Services       Precautions / Restrictions Precautions Precautions: Fall Precaution Comments: extremely rigid t/o all extremities/body, L eye with swelling Restrictions Weight Bearing Restrictions: No      Mobility Bed Mobility Overal bed mobility: Needs Assistance Bed Mobility: Rolling, Supine to Sit, Sit to Supine Rolling: +2 for physical assistance, Total assist   Supine to sit: Total assist, +2 for physical assistance Sit to supine: Total assist, +2 for physical assistance   General bed mobility comments: pt extremely rigid. HOB raised up to 70 deg and helicopter transfer completed as pt unable to assist at all with transfer, totalAx2 to return to bed and slide up in bed as well    Transfers                   General transfer comment: pt unable this date due to lethargy, weakness, and rigidity      Balance Overall balance assessment: Needs assistance Sitting-balance support: Feet supported, Bilateral upper extremity supported Sitting balance-Leahy Scale: Zero Sitting balance - Comments: pt dependent on maxA posteriorly to maintain EOB balance, pt with no righting response when posterior support lessoned or when laterally bending to L or R. pt with no active attempt to maintain EOB balance. Pt did attempt to raise head to command however unable to maintain cervical attention                                   ADL either  performed or assessed with clinical judgement   ADL                                         General ADL Comments: currently requiring total assist     Vision Baseline Vision/History: 1 Wears glasses Ability to See in Adequate Light: 1 Impaired Vision  Assessment?: Yes Eye Alignment: Impaired (comment) Ocular Range of Motion: Restricted on the left;Restricted looking down;Restricted looking up Alignment/Gaze Preference: Gaze right Tracking/Visual Pursuits: Right eye does not track medially;Left eye does not track laterally Visual Fields: Left visual field deficit Additional Comments: L inattention     Perception     Praxis      Pertinent Vitals/Pain Pain Assessment Pain Assessment: Faces Faces Pain Scale: No hurt     Hand Dominance Right   Extremity/Trunk Assessment Upper Extremity Assessment Upper Extremity Assessment: LUE deficits/detail;RUE deficits/detail RUE Deficits / Details: 3+/5 gross grasp, 3/5 elbow flexion, no active shoulder movement, full PROM, rigidity RUE Coordination: decreased fine motor;decreased gross motor LUE Deficits / Details: no movement elicited, rigidity LUE Coordination: decreased fine motor;decreased gross motor   Lower Extremity Assessment Lower Extremity Assessment: Defer to PT evaluation   Cervical / Trunk Assessment Cervical / Trunk Assessment: Other exceptions (weakness)   Communication Communication Communication: Other (comment) (minimally conversive, primarily states "ok")   Cognition Arousal/Alertness: Lethargic Behavior During Therapy: Flat affect Overall Cognitive Status: Impaired/Different from baseline Area of Impairment: Following commands, Safety/judgement, Awareness, Problem solving                       Following Commands: Follows one step commands with increased time Safety/Judgement: Decreased awareness of safety, Decreased awareness of deficits   Problem Solving: Slow processing, Requires verbal cues, Requires tactile cues General Comments: pt with flat affect but responds appropriately to majority of questions with simple yes/no or one word answers. pt orientedx3. Pt lethargic from medical events over the last 24 hours and required max verbal cues to attempt  to keep eyes open     General Comments  pt continues with hematuria. pt with small BM and noted skin breakdown around anus, RN notified and put barrier cream on. pt with edema t/o body, bruising t/o bilat feet    Exercises     Shoulder Instructions      Home Living Family/patient expects to be discharged to:: Private residence Living Arrangements: Spouse/significant other Available Help at Discharge: Family;Available PRN/intermittently Type of Home: House Home Access: Stairs to enter [employer] of Steps: 2 (front); several with rail (at back)--normal entry for them Entrance Stairs-Rails: None Home Layout: Two level;Laundry or work area in basement;Able to live on main level with bedroom/bathroom     Bathroom Shower/Tub: Other (comment) (barrier free shower)   Bathroom Toilet: Handicapped height     Home Equipment: Conservation officer, nature (2 wheels)   Additional Comments: retired Engineer, materials  Lives With: Spouse    Prior Functioning/Environment Prior Level of Function : Independent/Modified Independent                        OT Problem List: Decreased strength;Impaired balance (sitting and/or standing);Impaired vision/perception;Decreased coordination;Decreased cognition;Impaired UE functional use      OT Treatment/Interventions: Self-care/ADL training;DME and/or AE instruction;Patient/family education;Balance training;Visual/perceptual remediation/compensation;Therapeutic activities;Therapeutic exercise    OT Goals(Current goals can be found in the care plan section) Acute Rehab  OT Goals OT Goal Formulation: With patient/family Time For Goal Achievement: 07/06/22 Potential to Achieve Goals: Good ADL Goals Pt Will Perform Grooming: with mod assist;sitting Pt Will Perform Upper Body Bathing: with mod assist;sitting Pt Will Perform Upper Body Dressing: with mod assist;sitting Pt Will Transfer to Toilet: with +2 assist;with mod assist;stand pivot  transfer Pt/caregiver will Perform Home Exercise Program: Both right and left upper extremity;With minimal assist;Increased strength Additional ADL Goal #1: Pt will complete bed mobility with moderate assistance in preparation for ADLs. Additional ADL Goal #2: Pt will turn his head/eyes to 45 degrees toward L hemispace to locate visual target.  OT Frequency: Min 3X/week    Co-evaluation PT/OT/SLP Co-Evaluation/Treatment: Yes Reason for Co-Treatment: For patient/therapist safety;Complexity of the patient's impairments (multi-system involvement)   OT goals addressed during session: ADL's and self-care;Strengthening/ROM      AM-PAC OT "6 Clicks" Daily Activity     Outcome Measure Help from another person eating meals?: Total Help from another person taking care of personal grooming?: Total Help from another person toileting, which includes using toliet, bedpan, or urinal?: Total Help from another person bathing (including washing, rinsing, drying)?: Total Help from another person to put on and taking off regular upper body clothing?: Total Help from another person to put on and taking off regular lower body clothing?: Total 6 Click Score: 6   End of Session Nurse Communication: Mobility status;Other (comment) (aware of skin issues on buttocks, applied barrier cream)  Activity Tolerance: Patient limited by lethargy Patient left: in bed;with call bell/phone within reach;with bed alarm set;with family/visitor present  OT Visit Diagnosis: Muscle weakness (generalized) (M62.81);Low vision, both eyes (H54.2) Symptoms and signs involving cognitive functions: Cerebral infarction                Time: 1322-1400 OT Time Calculation (min): 38 min Charges:  OT General Charges $OT Visit: 1 Visit OT Evaluation $OT Re-eval: 1 Re-eval OT Treatments $Neuromuscular Re-education: 8-22 mins  Devin Mercado, OTR/L Acute Rehabilitation Services Office: [phone]  Malka So 06/22/2022, 3:51  PM

## 2022-06-22 NOTE — Progress Notes (Signed)
Physical Therapy RE-EVALUATION  Patient Details Name: Devin Mercado MRN: [id number] DOB: 1950-07-28 Today's Date: 06/22/2022   History of Present Illness This is a 71 year old male presents with hematuria. 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 On 12/9 code stroke was called for change in symptoms. Pt found to have a R MCA occlusion. Pt underwent R MCA thrombectomy on 12/9. PHMx: prostate cancer with bone mets, anemia, pancytopenia. L Reverse TSA April 2023    PT Comments    Pt unfortunately suffered another stroke on 12/9. Pt now with rigidity amongst all extremities, no active movement t/o 4 extremities, noted swelling t/o all extremities and is a totalAx2 for rolling and transfer to EOB. Pt very lethargic today but was orientedx3 and respond appropriately to majority of questions with simple yes/no or one word answers. Pt unable to keep eyes open t/o session but was able to understand PT/OT. Continue to recommend AIR upon d/c for maximal functional recovery and to decrease burden of care on family. Acute PT to cont to follow.    Recommendations for follow up therapy are one component of a multi-disciplinary discharge planning process, led by the attending physician.  Recommendations may be updated based on patient status, additional functional criteria and insurance authorization.  Follow Up Recommendations  Acute inpatient rehab (3hours/day)     Assistance Recommended at Discharge Frequent or constant Supervision/Assistance  Patient can return home with the following A lot of help with walking and/or transfers;A lot of help with bathing/dressing/bathroom;Assistance with cooking/housework;Assist for transportation;Help with stairs or ramp for entrance   Equipment Recommendations   (TBD)    Recommendations for Other Services Rehab consult     Precautions /  Restrictions Precautions Precautions: Fall Precaution Comments: extremely rigid t/o all extremities/body, L eye with swelling Restrictions Weight Bearing Restrictions: No     Mobility  Bed Mobility Overal bed mobility: Needs Assistance Bed Mobility: Supine to Sit, Sit to Supine     Supine to sit: Total assist, +2 for physical assistance Sit to supine: Total assist, +2 for physical assistance   General bed mobility comments: pt extremely rigid. HOB raised up to 70 deg and helicopter transfer completed as pt unable to assist at all with transfer, totalAx2 to return to bed and slide up in bed as well    Transfers                   General transfer comment: pt unable this date due to lethargy, weakness, and rigidity    Ambulation/Gait               General Gait Details: unable this date   Stairs             Wheelchair Mobility    Modified Rankin (Stroke Patients Only) Modified Rankin (Stroke Patients Only) Pre-Morbid Rankin Score: No symptoms Modified Rankin: Moderately severe disability     Balance Overall balance assessment: Needs assistance Sitting-balance support: Feet supported, Bilateral upper extremity supported Sitting balance-Leahy Scale: Zero Sitting balance - Comments: pt dependent on maxA posteriorly to maintain EOB balance, pt with no righting response when posterior support lessoned or when laterally bending to L or R. pt with no active attempt to maintain EOB balance. Pt did attempt to raise head to command however unable to maintain cervical attention  Cognition Arousal/Alertness: Lethargic Behavior During Therapy: Flat affect Overall Cognitive Status: Impaired/Different from baseline Area of Impairment: Following commands, Safety/judgement, Awareness, Problem solving                       Following Commands: Follows one step commands with increased time Safety/Judgement:  Decreased awareness of safety, Decreased awareness of deficits   Problem Solving: Slow processing, Requires verbal cues, Requires tactile cues General Comments: pt with flat affect but responds appropriately to majority of questions with simple yes/no or one word answers. pt orientedx3. Pt lethargic from medical events over the last 24 hours and required max verbal cues to attempt to keep eyes open        Exercises Other Exercises Other Exercises: attempted PROM to bilat LEs however extremely rigid with minimal bilat DF, knee or hip flexion despite max tactile cues and assist    General Comments General comments (skin integrity, edema, etc.): pt continues with hematuria. pt with small BM and noted skin breakdown around anus, RN notified and put barrier cream on. pt with edema t/o body, bruising t/o bilat feet      Pertinent Vitals/Pain Pain Assessment Pain Assessment: Faces Faces Pain Scale: No hurt    Home Living                          Prior Function            PT Goals (current goals can now be found in the care plan section) Acute Rehab PT Goals PT Goal Formulation: With patient/family Time For Goal Achievement: 07/06/22 Potential to Achieve Goals: Fair Progress towards PT goals: Goals downgraded-see care plan    Frequency    Min 4X/week      PT Plan Discharge plan needs to be updated    Co-evaluation              AM-PAC PT "6 Clicks" Mobility   Outcome Measure  Help needed turning from your back to your side while in a flat bed without using bedrails?: Total Help needed moving from lying on your back to sitting on the side of a flat bed without using bedrails?: Total Help needed moving to and from a bed to a chair (including a wheelchair)?: Total Help needed standing up from a chair using your arms (e.g., wheelchair or bedside chair)?: Total Help needed to walk in hospital room?: Total Help needed climbing 3-5 steps with a railing? :  Total 6 Click Score: 6    End of Session   Activity Tolerance: Patient limited by lethargy Patient left: in bed;with call bell/phone within reach;with bed alarm set;with family/visitor present Nurse Communication: Mobility status PT Visit Diagnosis: Other abnormalities of gait and mobility (R26.89);Muscle weakness (generalized) (M62.81)     Time: 1322-1400 PT Time Calculation (min) (ACUTE ONLY): 38 min  Charges:                        Kittie Plater, PT, DPT Acute Rehabilitation Services Secure chat preferred Office #: [phone]    Berline Lopes 06/22/2022, 2:58 PM

## 2022-06-22 NOTE — Progress Notes (Addendum)
Patient ID: Devin Mercado, male   DOB: 1951/03/26, 71 y.o.   MRN: [id number]  1 Day Post-Op Subjective: Pt suffered an acute CVA last night with occlusion of the right MCA s/p thrombectomy by Dr. Estanislado Pandy.  Has had return of most acute deficits according to his wife.  He has been voiding but urine still wine colored.  Objective: Vital signs in last 24 hours: Temp:  [97.6 F (36.4 C)-98 F (36.7 C)] 98 F (36.7 C) (12/10 0800) Pulse Rate:  [62-86] 73 (12/10 1200) Resp:  [12-26] 17 (12/10 1200) BP: (103-144)/(57-91) 109/63 (12/10 1200) SpO2:  [93 %-100 %] 96 % (12/10 1200) Arterial Line BP: (124-144)/(48-68) 140/58 (12/10 1200)  Intake/Output from previous day: 12/09 0701 - 12/10 0700 In: 4314.9 [P.O.:740; I.V.:3515.2; IV Piggyback:59.7] Out: 900 [Urine:900] Intake/Output this shift: Total I/O In: 396.9 [I.V.:396.9] Out: -   Physical Exam:  General: Alert and oriented Abd: Nondistended  Lab Results: Recent Labs    07/10/2022 1702 06/16/2022 0509 06/22/22 0642  HGB 8.7* 8.5* 7.9*  HCT 24.8* 23.4* 23.6*      Latest Ref Rng & Units 06/22/2022    6:42 AM 07/13/2022    5:09 AM 06/26/2022    5:02 PM  CBC  WBC 4.0 - 10.5 K/uL 5.1  4.3  4.9   Hemoglobin 13.0 - 17.0 g/dL 7.9  8.5  8.7   Hematocrit 39.0 - 52.0 % 23.6  23.4  24.8   Platelets 150 - 400 K/uL 46  72  93      BMET Recent Labs    06/26/2022 0509 06/22/22 0642  NA 130* 132*  K 4.3 4.0  CL 98 102  CO2 19* 15*  GLUCOSE 156* 156*  BUN 30* 41*  CREATININE 1.26* 1.25*  CALCIUM 6.4* 5.9*     Studies/Results: CT HEAD WO CONTRAST  Result Date: 06/22/2022 CLINICAL DATA:  Stroke follow-up EXAM: CT HEAD WITHOUT CONTRAST TECHNIQUE: Contiguous axial images were obtained from the base of the skull through the vertex without intravenous contrast. RADIATION DOSE REDUCTION: This exam was performed according to the departmental dose-optimization program which includes automated exposure control, adjustment of the mA  and/or kV according to patient size and/or use of iterative reconstruction technique. COMPARISON:  Head CT from yesterday FINDINGS: Brain: Large area of cytotoxic edema in the posterior division of the right MCA territory with patchy petechial hemorrhage. Localized swelling. Unchanged left cerebellar infarct which is recent. More subacute left occipital cortex infarct. No hydrocephalus or collection. Vascular: No hyperdense vessel. Skull: Normal. Negative for fracture or focal lesion. Sinuses/Orbits: No acute finding. IMPRESSION: 1. Extensive right MCA territory infarct with patchy petechial hemorrhage. 2. Known recent infarcts in the left cerebrum and cerebellum. Electronically Signed   By: Jorje Guild M.D.   On: 06/22/2022 09:39   CT ANGIO HEAD NECK W WO CM (CODE STROKE)  Result Date: 07/09/2022 CLINICAL DATA:  Stroke suspected EXAM: CT ANGIOGRAPHY HEAD AND NECK TECHNIQUE: Multidetector CT imaging of the head and neck was performed using the standard protocol during bolus administration of intravenous contrast. Multiplanar CT image reconstructions and MIPs were obtained to evaluate the vascular anatomy. Carotid stenosis measurements (when applicable) are obtained utilizing NASCET criteria, using the distal internal carotid diameter as the denominator. RADIATION DOSE REDUCTION: This exam was performed according to the departmental dose-optimization program which includes automated exposure control, adjustment of the mA and/or kV according to patient size and/or use of iterative reconstruction technique. CONTRAST:  38m OMNIPAQUE IOHEXOL 350 MG/ML SOLN COMPARISON:  CTA head neck 06/14/2022, correlation is also made with same day CT head FINDINGS: CT HEAD FINDINGS For noncontrast findings, please see same day CT head. CTA NECK FINDINGS Aortic arch: Standard branching. Imaged portion shows no evidence of aneurysm or dissection. No significant stenosis of the major arch vessel origins. Right carotid system: No  evidence of dissection, occlusion, or hemodynamically significant stenosis (greater than 50%). Left carotid system: No evidence of dissection, occlusion, or hemodynamically significant stenosis (greater than 50%). Vertebral arteries: No evidence of dissection, occlusion, or hemodynamically significant stenosis (greater than 50%). Skeleton: Redemonstrated multifocal sclerotic lesions in cervical and upper thoracic spine. No evidence of pathologic fracture. Other neck: Negative. Upper chest: Small bilateral pleural effusions with associated atelectasis, new from the prior exam. Review of the MIP images confirms the above findings CTA HEAD FINDINGS Anterior circulation: Both internal carotid arteries are patent to the termini, without significant stenosis. A1 segments patent. Normal anterior communicating artery. Anterior cerebral arteries are patent to their distal aspects. Occlusion of the distal right M1 (series 7, image 93), without evidence of reconstitution or flow in the right MCA territory. No left M1 stenosis or occlusion. The left MCA vessels branches are perfused without significant stenosis. Posterior circulation: Vertebral arteries patent to the vertebrobasilar junction without stenosis. Basilar patent to its distal aspect. Superior cerebellar arteries patent proximally. Patent P1 segments. PCAs perfused to their distal aspects without stenosis. The right posterior communicating artery is patent. Venous sinuses: As permitted by contrast timing, patent. Anatomic variants: None significant. Review of the MIP images confirms the above findings IMPRESSION: 1. Occlusion of the distal right M1, without evidence of reconstitution or flow in the right MCA territory. 2. No hemodynamically significant stenosis in the neck. 3. Small bilateral pleural effusions with associated atelectasis, new from the prior exam. 4. Redemonstrated multifocal sclerotic lesions in the cervical and upper thoracic spine, consistent with  osseous metastatic disease. No evidence of pathologic fracture. Code stroke imaging results were communicated on 07/13/2022 at 10:10 pm to provider Dr. Lorrin Goodell via secure text paging. Electronically Signed   By: Merilyn Baba M.D.   On: 06/14/2022 22:17   CT HEAD CODE STROKE WO CONTRAST  Result Date: 06/13/2022 CLINICAL DATA:  Code stroke. EXAM: CT HEAD WITHOUT CONTRAST TECHNIQUE: Contiguous axial images were obtained from the base of the skull through the vertex without intravenous contrast. RADIATION DOSE REDUCTION: This exam was performed according to the departmental dose-optimization program which includes automated exposure control, adjustment of the mA and/or kV according to patient size and/or use of iterative reconstruction technique. COMPARISON:  06/15/2022 FINDINGS: Brain: New area of hypodensity in the left mid to inferior cerebellum (series 2, image 6 and series 6, image 39), concerning for acute infarct. 7 mm focus of hyperdensity in the right occipital lobe, associated with an area of hypodensity and loss of gray-white differentiation, concerning for a small area of hemorrhagic transformation. Redemonstrated areas of loss of gray-white differentiation in the left occipital lobe without evidence of hemorrhagic transformation, but a possible additional area of hypodense in the adjacent left parietal lobe (series 2, image 17). No evidence of mass, mass effect, or midline shift. No hydrocephalus or extra-axial collection. Vascular: No hyperdense vessel. Skull: Negative for fracture or focal lesion. Sinuses/Orbits: Mild mucosal thickening in the left maxillary sinus. The orbits are unremarkable. Other: The mastoid air cells are well aerated. ASPECTS [facility] Stroke Program Early CT Score) - Ganglionic level infarction (caudate, lentiform nuclei, internal capsule, insula, M1-M3 cortex): 7 - Supraganglionic infarction (  M4-M6 cortex): 3 Total score (0-10 with 10 being normal): 10 IMPRESSION: 1. New area  of hypodensity in the left mid to inferior cerebellum, concerning for acute infarct. 2. 7 mm focus of hyperdensity in the right occipital lobe in an area of prior parieto-occipital infarct, concerning for a small area of hemorrhagic transformation. 3. New area of hypodensity in the left parietal lobe, concerning for additional acute infarct, adjacent to a previously noted area of infarct in the left occipital lobe, without evidence of hemorrhagic transformation. Code stroke imaging results were communicated on 06/26/2022 at 10:06 pm to provider Dr. Lorrin Goodell via secure text paging. Electronically Signed   By: Merilyn Baba M.D.   On: 06/30/2022 22:06    Assessment/Plan: 1) CRPC: On ADT with progressive metastatic prostate cancer s/p treatment with docetaxel, Lutetium-177, and radium-223 all of which are contributory to pancytopenia.  Appreciate Dr. Grier Mitts involvement.   2) Hematuria/radiation cystitis:  This has been previously confirmed earlier in the fall to be due to radiation cystitis and he has been tentatively scheduled for hyperbaric oxygen therapy 12/19.  S/P cysto with fulguration 12/8.  Still with some hematuria based on evaluation this morning.  Hgb drifting again and platelet count now down to 46.  There are no other cystoscopic procedures that would be helpful.  Would recommend transfusion as needed. If continued bleeding over the next 24 hrs, can consider continuous bladder irrigation with alum solution as a next step.     LOS: 10 days   [name] 06/22/2022, 12:33 PM

## 2022-06-22 NOTE — Progress Notes (Addendum)
Date and time results received: 06/22/22 1100  Test: Calcium Critical Value: 5.9  Name of Provider Notified: Erlinda Hong, MD  Orders Received? Or Actions Taken?: No new orders at this time.

## 2022-06-22 NOTE — Progress Notes (Signed)
STROKE TEAM PROGRESS NOTE   SUBJECTIVE (INTERVAL HISTORY) His wife and speech therapist are at the bedside.  Overall his condition is gradually improving. Pt is lethargic but easily open eyes and follows most simple commands. Still has right gaze and left hemiplegia. Still has hematuria, urology on board. Continue monitoring Hb and platelet.    OBJECTIVE Temp:  [97.6 F (36.4 C)-98 F (36.7 C)] 97.7 F (36.5 C) (12/10 1200) Pulse Rate:  [62-86] 73 (12/10 1200) Cardiac Rhythm: A-V Sequential paced (12/10 1200) Resp:  [12-26] 17 (12/10 1200) BP: (103-144)/(57-91) 109/63 (12/10 1200) SpO2:  [93 %-100 %] 96 % (12/10 1200) Arterial Line BP: (124-144)/(48-68) 140/58 (12/10 1200)  Recent Labs  Lab 06/13/2022 0316 07/06/2022 2132 06/22/22 0739 06/22/22 1146  GLUCAP 153* 198* 162* 141*   Recent Labs  Lab 06/18/22 0330 06/19/22 0555 07/03/2022 0312 06/13/2022 0509 06/22/22 0642  NA 131* 128* 129* 130* 132*  K 4.4 4.3 4.2 4.3 4.0  CL 97* 100 97* 98 102  CO2 18* 18* 19* 19* 15*  GLUCOSE 106* 116* 115* 156* 156*  BUN 27* 25* 29* 30* 41*  CREATININE 1.10 1.14 1.27* 1.26* 1.25*  CALCIUM 8.0* 7.0* 6.7* 6.4* 5.9*   Recent Labs  Lab 06/22/22 0642  AST 43*  ALT 23  ALKPHOS 86  BILITOT 0.4  PROT 4.4*  ALBUMIN 1.6*   Recent Labs  Lab 06/19/22 2047 06/15/2022 0312 06/16/2022 1702 06/14/2022 0509 06/22/22 0642  WBC 3.4* 3.9* 4.9 4.3 5.1  NEUTROABS 2.3  --   --   --  4.4  HGB 6.9* 8.8* 8.7* 8.5* 7.9*  HCT 21.0* 24.5* 24.8* 23.4* 23.6*  MCV 88.2 81.1 82.9 81.8 84.6  PLT 14* 31*  30* 93* 72* 46*   No results for input(s): "CKTOTAL", "CKMB", "CKMBINDEX", "TROPONINI" in the last 168 hours. Recent Labs    07/02/2022 0312  LABPROT 18.8*  18.4*  INR 1.6*  1.5*   No results for input(s): "COLORURINE", "LABSPEC", "PHURINE", "GLUCOSEU", "HGBUR", "BILIRUBINUR", "KETONESUR", "PROTEINUR", "UROBILINOGEN", "NITRITE", "LEUKOCYTESUR" in the last 72 hours.  Invalid input(s): "APPERANCEUR"      Component Value Date/Time   CHOL 106 06/22/2022 0642   TRIG 182 (H) 06/22/2022 0642   TRIG 89 05/28/2006 0000   HDL 17 (L) 06/22/2022 0642   CHOLHDL 6.2 06/22/2022 0642   VLDL 36 06/22/2022 0642   LDLCALC 53 06/22/2022 0642   LDLCALC 98 05/01/2020 1456   Lab Results  Component Value Date   HGBA1C 5.6 05/01/2020   No results found for: "LABOPIA", "COCAINSCRNUR", "LABBENZ", "AMPHETMU", "THCU", "LABBARB"  No results for input(s): "ETH" in the last 168 hours.  I have personally reviewed the radiological images below and agree with the radiology interpretations.  CT HEAD WO CONTRAST  Result Date: 06/22/2022 CLINICAL DATA:  Stroke follow-up EXAM: CT HEAD WITHOUT CONTRAST TECHNIQUE: Contiguous axial images were obtained from the base of the skull through the vertex without intravenous contrast. RADIATION DOSE REDUCTION: This exam was performed according to the departmental dose-optimization program which includes automated exposure control, adjustment of the mA and/or kV according to patient size and/or use of iterative reconstruction technique. COMPARISON:  Head CT from yesterday FINDINGS: Brain: Large area of cytotoxic edema in the posterior division of the right MCA territory with patchy petechial hemorrhage. Localized swelling. Unchanged left cerebellar infarct which is recent. More subacute left occipital cortex infarct. No hydrocephalus or collection. Vascular: No hyperdense vessel. Skull: Normal. Negative for fracture or focal lesion. Sinuses/Orbits: No acute finding. IMPRESSION: 1. Extensive  right MCA territory infarct with patchy petechial hemorrhage. 2. Known recent infarcts in the left cerebrum and cerebellum. Electronically Signed   By: Jorje Guild M.D.   On: 06/22/2022 09:39   CT ANGIO HEAD NECK W WO CM (CODE STROKE)  Result Date: 06/25/2022 CLINICAL DATA:  Stroke suspected EXAM: CT ANGIOGRAPHY HEAD AND NECK TECHNIQUE: Multidetector CT imaging of the head and neck was performed  using the standard protocol during bolus administration of intravenous contrast. Multiplanar CT image reconstructions and MIPs were obtained to evaluate the vascular anatomy. Carotid stenosis measurements (when applicable) are obtained utilizing NASCET criteria, using the distal internal carotid diameter as the denominator. RADIATION DOSE REDUCTION: This exam was performed according to the departmental dose-optimization program which includes automated exposure control, adjustment of the mA and/or kV according to patient size and/or use of iterative reconstruction technique. CONTRAST:  45m OMNIPAQUE IOHEXOL 350 MG/ML SOLN COMPARISON:  CTA head neck 06/14/2022, correlation is also made with same day CT head FINDINGS: CT HEAD FINDINGS For noncontrast findings, please see same day CT head. CTA NECK FINDINGS Aortic arch: Standard branching. Imaged portion shows no evidence of aneurysm or dissection. No significant stenosis of the major arch vessel origins. Right carotid system: No evidence of dissection, occlusion, or hemodynamically significant stenosis (greater than 50%). Left carotid system: No evidence of dissection, occlusion, or hemodynamically significant stenosis (greater than 50%). Vertebral arteries: No evidence of dissection, occlusion, or hemodynamically significant stenosis (greater than 50%). Skeleton: Redemonstrated multifocal sclerotic lesions in cervical and upper thoracic spine. No evidence of pathologic fracture. Other neck: Negative. Upper chest: Small bilateral pleural effusions with associated atelectasis, new from the prior exam. Review of the MIP images confirms the above findings CTA HEAD FINDINGS Anterior circulation: Both internal carotid arteries are patent to the termini, without significant stenosis. A1 segments patent. Normal anterior communicating artery. Anterior cerebral arteries are patent to their distal aspects. Occlusion of the distal right M1 (series 7, image 93), without evidence  of reconstitution or flow in the right MCA territory. No left M1 stenosis or occlusion. The left MCA vessels branches are perfused without significant stenosis. Posterior circulation: Vertebral arteries patent to the vertebrobasilar junction without stenosis. Basilar patent to its distal aspect. Superior cerebellar arteries patent proximally. Patent P1 segments. PCAs perfused to their distal aspects without stenosis. The right posterior communicating artery is patent. Venous sinuses: As permitted by contrast timing, patent. Anatomic variants: None significant. Review of the MIP images confirms the above findings IMPRESSION: 1. Occlusion of the distal right M1, without evidence of reconstitution or flow in the right MCA territory. 2. No hemodynamically significant stenosis in the neck. 3. Small bilateral pleural effusions with associated atelectasis, new from the prior exam. 4. Redemonstrated multifocal sclerotic lesions in the cervical and upper thoracic spine, consistent with osseous metastatic disease. No evidence of pathologic fracture. Code stroke imaging results were communicated on 07/01/2022 at 10:10 pm to provider Dr. KLorrin Goodellvia secure text paging. Electronically Signed   By: AMerilyn BabaM.D.   On: 06/17/2022 22:17   CT HEAD CODE STROKE WO CONTRAST  Result Date: 06/14/2022 CLINICAL DATA:  Code stroke. EXAM: CT HEAD WITHOUT CONTRAST TECHNIQUE: Contiguous axial images were obtained from the base of the skull through the vertex without intravenous contrast. RADIATION DOSE REDUCTION: This exam was performed according to the departmental dose-optimization program which includes automated exposure control, adjustment of the mA and/or kV according to patient size and/or use of iterative reconstruction technique. COMPARISON:  06/15/2022 FINDINGS: Brain: New  area of hypodensity in the left mid to inferior cerebellum (series 2, image 6 and series 6, image 39), concerning for acute infarct. 7 mm focus of  hyperdensity in the right occipital lobe, associated with an area of hypodensity and loss of gray-white differentiation, concerning for a small area of hemorrhagic transformation. Redemonstrated areas of loss of gray-white differentiation in the left occipital lobe without evidence of hemorrhagic transformation, but a possible additional area of hypodense in the adjacent left parietal lobe (series 2, image 17). No evidence of mass, mass effect, or midline shift. No hydrocephalus or extra-axial collection. Vascular: No hyperdense vessel. Skull: Negative for fracture or focal lesion. Sinuses/Orbits: Mild mucosal thickening in the left maxillary sinus. The orbits are unremarkable. Other: The mastoid air cells are well aerated. ASPECTS ([place] Stroke Program Early CT Score) - Ganglionic level infarction (caudate, lentiform nuclei, internal capsule, insula, M1-M3 cortex): 7 - Supraganglionic infarction (M4-M6 cortex): 3 Total score (0-10 with 10 being normal): 10 IMPRESSION: 1. New area of hypodensity in the left mid to inferior cerebellum, concerning for acute infarct. 2. 7 mm focus of hyperdensity in the right occipital lobe in an area of prior parieto-occipital infarct, concerning for a small area of hemorrhagic transformation. 3. New area of hypodensity in the left parietal lobe, concerning for additional acute infarct, adjacent to a previously noted area of infarct in the left occipital lobe, without evidence of hemorrhagic transformation. Code stroke imaging results were communicated on 07/04/2022 at 10:06 pm to provider Dr. Lorrin Goodell via secure text paging. Electronically Signed   By: Merilyn Baba M.D.   On: 07/11/2022 22:06   CT ABDOMEN PELVIS W CONTRAST  Result Date: 07/09/2022 CLINICAL DATA:  Metastatic prostate carcinoma bone pain EXAM: CT ABDOMEN AND PELVIS WITH CONTRAST TECHNIQUE: Multidetector CT imaging of the abdomen and pelvis was performed using the standard protocol following bolus administration  of intravenous contrast. RADIATION DOSE REDUCTION: This exam was performed according to the departmental dose-optimization program which includes automated exposure control, adjustment of the mA and/or kV according to patient size and/or use of iterative reconstruction technique. CONTRAST:  57m OMNIPAQUE IOHEXOL 350 MG/ML SOLN COMPARISON:  CT 09/26/2019 bone scan 09/26/2019 FINDINGS: Lower chest: Bilateral layering effusions with lower lobe mild passive atelectasis. Increased bilateral gynecomastia. Hepatobiliary: Benign cyst in the LEFT hepatic lobe. Several gallstones within the lumen the gallbladder. No gallbladder inflammation. Pancreas: Pancreas is normal. No ductal dilatation. No pancreatic inflammation. Spleen: Normal spleen Adrenals/urinary tract: Adrenal glands and kidneys normal. No ureterolithiasis. Bladder is decompressed around a Foley catheter. Stomach/Bowel: Stomach, small bowel, appendix, and cecum are normal. The colon and rectosigmoid colon are normal. Vascular/Lymphatic: Abdominal aorta is normal caliber. No periportal or retroperitoneal adenopathy. No pelvic adenopathy. Reproductive: Post prostatectomy Other: No pelvic lymphadenopathy. Musculoskeletal: Multiple sclerotic lesions in the pelvis and spine. The number of lesions is increased from [facility] [place]2021. Sclerotic lesion at L 1 is similar but he multiple new lesions in the spine and pelvis noted. Example new sclerotic lesion the central sacrum measuring 23 mm on image 68/3. Lesion in the RIGHT iliac wing measuring 12 mm on image 65/3. Approximately 40 lesions noted. IMPRESSION: 1. Progression sclerotic skeletal metastasis compared to CT 09/26/2019. 2. No pelvic or retroperitoneal lymphadenopathy. 3. Increase in gynecomastia compared to prior. 4. Cholelithiasis without evidence cholecystitis. Electronically Signed   By: SSuzy BouchardM.D.   On: 06/27/2022 09:42   CT L-SPINE NO CHARGE  Result Date: 07/06/2022 CLINICAL DATA:  Progressive lumbar  pain EXAM: CT LUMBAR SPINE  WITHOUT CONTRAST TECHNIQUE: Multidetector CT imaging of the lumbar spine was performed without intravenous contrast administration. Multiplanar CT image reconstructions were also generated. RADIATION DOSE REDUCTION: This exam was performed according to the departmental dose-optimization program which includes automated exposure control, adjustment of the mA and/or kV according to patient size and/or use of iterative reconstruction technique. COMPARISON:  CT abdomen 09/14/2014 FINDINGS: Segmentation: 5 lumbar type vertebrae. Alignment: Minimal retrolisthesis of L2 on L3 and L3 on L4. Vertebrae: No acute fracture or subluxation. Sclerotic bone lesions throughout the visualized thoracolumbar spine, sacrum, and ilium concerning for metastatic disease. Paraspinal and other soft tissues: No acute paraspinal abnormality. Mild abdominal aortic atherosclerosis. Other: None Disc levels: Disc spaces: Degenerative disease with disc height loss at L3-4. T12-L1: Minimal broad-based disc bulge. Mild bilateral facet arthropathy. No foraminal or central canal stenosis. L1-L2: Mild broad-based disc bulge. Mild bilateral facet arthropathy. No foraminal or central canal stenosis. L2-L3: Broad-based disc bulge. Moderate bilateral facet arthropathy. Mild spinal stenosis. Mild left foraminal stenosis. No right foraminal stenosis. L3-L4: Broad-based disc bulge. Mild bilateral facet arthropathy. Moderate spinal stenosis. Moderate left foraminal stenosis. No right foraminal stenosis. L4-L5: Broad-based disc bulge. Mild bilateral facet arthropathy. Mild bilateral foraminal stenosis, left greater than right. No spinal stenosis. L5-S1: Broad-based disc bulge. Moderate left facet arthropathy. Mild left foraminal stenosis. No right foraminal stenosis. IMPRESSION: 1. No acute osseous injury of the lumbar spine. 2. Sclerotic bone lesions throughout the visualized thoracolumbar spine, sacrum, and ilium concerning for  metastatic disease. 3. Diffuse lumbar spine spondylosis as described above. Electronically Signed   By: Kathreen Devoid M.D.   On: 07/08/2022 09:32   DG Abd 1 View  Result Date: 06/19/2022 CLINICAL DATA:  Distension EXAM: ABDOMEN - 1 VIEW COMPARISON:  CT 09/14/2014 FINDINGS: Mild diffuse increased small and large bowel gas. Bilateral hip replacements. Pelvic phleboliths. Amorphous density in the left upper quadrant could represent something in the enteral stream. No definite radiopaque calculi. IMPRESSION: Mild diffuse increased small and large bowel gas suggestive of mild ileus. Electronically Signed   By: Donavan Foil M.D.   On: 06/19/2022 23:28   DG Chest Port 1 View  Result Date: 06/18/2022 CLINICAL DATA:  Fever, urinary retention EXAM: PORTABLE CHEST 1 VIEW COMPARISON:  06/13/2022 FINDINGS: Left pacer unchanged. Mild cardiomegaly. No confluent opacities, effusions or edema. No acute bony abnormality. IMPRESSION: Mild cardiomegaly.  No active disease. Electronically Signed   By: Rolm Baptise M.D.   On: 06/18/2022 01:08   CT HEAD WO CONTRAST (5MM)  Result Date: 06/15/2022 CLINICAL DATA:  Stroke follow-up EXAM: CT HEAD WITHOUT CONTRAST TECHNIQUE: Contiguous axial images were obtained from the base of the skull through the vertex without intravenous contrast. RADIATION DOSE REDUCTION: This exam was performed according to the departmental dose-optimization program which includes automated exposure control, adjustment of the mA and/or kV according to patient size and/or use of iterative reconstruction technique. COMPARISON:  06/14/2022 FINDINGS: Brain: There are areas of loss of gray-white differentiation in the left occipital lobe and right parietal lobe, unchanged. No acute hemorrhage. Mild generalized volume loss. There is periventricular hypoattenuation compatible with chronic microvascular disease. Incidentally noted cavum septum pellucidum et vergae. Vascular: No hyperdense vessel or unexpected  calcification. Skull: Normal. Negative for fracture or focal lesion. Sinuses/Orbits: No acute finding. Other: None. IMPRESSION: Unchanged areas of loss of gray-white differentiation in the left occipital lobe and right parietal lobe, consistent with acute/subacute infarcts. No acute hemorrhage. Electronically Signed   By: Ulyses Jarred M.D.   On:  06/15/2022 21:54   ECHOCARDIOGRAM COMPLETE  Result Date: 06/15/2022    ECHOCARDIOGRAM REPORT   Patient Name:   DINK CREPS [facility] Date of Exam: 06/15/2022 Medical Rec #:  [id number]        Height:       72.0 in Accession #:    [id number]       Weight:       227.1 lb Date of Birth:  11/27/1950         BSA:          2.249 m Patient Age:    71 years         BP:           120/59 mmHg Patient Gender: M                HR:           88 bpm. Exam Location:  Inpatient Procedure: 2D Echo, Color Doppler and Cardiac Doppler Indications:    Stroke i63.9  History:        Patient has no prior history of Echocardiogram examinations.                 Pacemaker; Risk Factors:Hypertension and Dyslipidemia.  Sonographer:    Raquel Sarna Senior RDCS Referring Phys: [id number] [facility]  Sonographer Comments: Technically difficult due to poor echo windows. IMPRESSIONS  1. Difficult acoustic windows limit study Would recomm limited echo with Definity to confirm LVEF and wall motion. There appears to be hypokinesis of the mid/distal lateral, mid/distal septal, distal inferior walls and apical akinesis.. Left ventricular  ejection fraction, by estimation, is 30%. The left ventricle has moderately decreased function.  2. Right ventricular systolic function is normal. The right ventricular size is normal. There is normal pulmonary artery systolic pressure.  3. The mitral valve is normal in structure. Trivial mitral valve regurgitation.  4. The aortic valve is normal in structure. Aortic valve regurgitation is not visualized.  5. There is mild dilatation of the ascending aorta, measuring 42 mm.  6. The  inferior vena cava is normal in size with greater than 50% respiratory variability, suggesting right atrial pressure of 3 mmHg. FINDINGS  Left Ventricle: Difficult acoustic windows limit study Would recomm limited echo with Definity to confirm LVEF and wall motion. There appears to be hypokinesis of the mid/distal lateral, mid/distal septal, distal inferior walls and apical akinesis. Left  ventricular ejection fraction, by estimation, is 30%. The left ventricle has moderately decreased function. The left ventricular internal cavity size was normal in size. There is no left ventricular hypertrophy. Right Ventricle: The right ventricular size is normal. Right vetricular wall thickness was not assessed. Right ventricular systolic function is normal. There is normal pulmonary artery systolic pressure. The tricuspid regurgitant velocity is 2.31 m/s, and with an assumed right atrial pressure of 3 mmHg, the estimated right ventricular systolic pressure is 30.0 mmHg. Left Atrium: Left atrial size was normal in size. Right Atrium: Right atrial size was normal in size. Pericardium: Trivial pericardial effusion is present. Mitral Valve: The mitral valve is normal in structure. Trivial mitral valve regurgitation. Tricuspid Valve: The tricuspid valve is grossly normal. Tricuspid valve regurgitation is mild. Aortic Valve: The aortic valve is normal in structure. Aortic valve regurgitation is not visualized. Pulmonic Valve: The pulmonic valve was not well visualized. Pulmonic valve regurgitation is not visualized. No evidence of pulmonic stenosis. Aorta: There is mild dilatation of the ascending aorta, measuring 42 mm. Venous: The inferior vena cava is normal  in size with greater than 50% respiratory variability, suggesting right atrial pressure of 3 mmHg. IAS/Shunts: No atrial level shunt detected by color flow Doppler. Additional Comments: A device lead is visualized.  LEFT VENTRICLE PLAX 2D LVIDd:         5.50 cm LVIDs:          3.30 cm LV PW:         1.10 cm LV IVS:        1.20 cm LVOT diam:     2.40 cm LV SV:         85 LV SV Index:   38 LVOT Area:     4.52 cm  LV Volumes (MOD) LV vol d, MOD A2C: 112.0 ml LV vol d, MOD A4C: 139.0 ml LV vol s, MOD A2C: 65.0 ml LV vol s, MOD A4C: 72.7 ml LV SV MOD A2C:     47.0 ml LV SV MOD A4C:     139.0 ml LV SV MOD BP:      56.6 ml RIGHT VENTRICLE RV S prime:     17.70 cm/s TAPSE (M-mode): 2.2 cm LEFT ATRIUM             Index        RIGHT ATRIUM           Index LA diam:        3.60 cm 1.60 cm/m   RA Area:     21.10 cm LA Vol (A2C):   66.4 ml 29.53 ml/m  RA Volume:   60.60 ml  26.95 ml/m LA Vol (A4C):   63.5 ml 28.24 ml/m LA Biplane Vol: 65.8 ml 29.26 ml/m  AORTIC VALVE LVOT Vmax:   102.00 cm/s LVOT Vmean:  71.100 cm/s LVOT VTI:    0.188 m  AORTA Ao Root diam: 4.00 cm Ao Asc diam:  4.20 cm TRICUSPID VALVE TR Peak grad:   21.3 mmHg TR Vmax:        231.00 cm/s  SHUNTS Systemic VTI:  0.19 m Systemic Diam: 2.40 cm Dorris Carnes MD Electronically signed by Dorris Carnes MD Signature Date/Time: 06/15/2022/4:20:16 PM    Final    CT ANGIO HEAD NECK W WO CM W PERF (CODE STROKE)  Result Date: 06/14/2022 CLINICAL DATA:  Neuro deficit, acute, stroke suspected. Right-sided weakness. EXAM: CT ANGIOGRAPHY HEAD AND NECK TECHNIQUE: Multidetector CT imaging of the head and neck was performed using the standard protocol during bolus administration of intravenous contrast. Multiplanar CT image reconstructions and MIPs were obtained to evaluate the vascular anatomy. Carotid stenosis measurements (when applicable) are obtained utilizing NASCET criteria, using the distal internal carotid diameter as the denominator. RADIATION DOSE REDUCTION: This exam was performed according to the departmental dose-optimization program which includes automated exposure control, adjustment of the mA and/or kV according to patient size and/or use of iterative reconstruction technique. CONTRAST:  144m OMNIPAQUE IOHEXOL 350 MG/ML SOLN  COMPARISON:  CT head without contrast 06/14/2022 at 2:50 p.m. FINDINGS: CTA NECK FINDINGS Aortic arch: Minimal atherosclerotic changes are present the distal aorta. Three vessel arch configuration is present. No significant stenosis is present at the great vessel origins. Right carotid system: Right common carotid artery is within normal limits. Minimal calcifications are present proximal right ICA without significant stenosis. Mild tortuosity is present in the distal cervical right ICA without significant stenosis. Left carotid system: The left common carotid artery is within normal limits. Bifurcation is unremarkable. Mild tortuosity is present cervical left ICA without significant stenosis. Vertebral arteries:  The left vertebral artery is the dominant vessel. Both vertebral arteries originate from the subclavian arteries without significant stenosis. No significant stenosis is present in either vertebral artery in the neck. Skeleton: Multiple sclerotic lesions are present throughout the cervical and upper thoracic spine. Diffuse vertebral body involvement is present at T2 and T3. Asymmetric right-sided involvement is present at T1 and T2. No pathologic fractures are present. Sclerotic lesions are present within ribs bilaterally. Other neck: Soft tissues the neck are otherwise unremarkable. Salivary glands are within normal limits. Thyroid is normal. No significant adenopathy is present. No focal mucosal or submucosal lesions are present. Upper chest: The lung apices are clear. Review of the MIP images confirms the above findings CTA HEAD FINDINGS Anterior circulation: Minimal atherosclerotic changes are present within the cavernous internal carotid arteries bilaterally. The A1 and M1 segments are normal. The anterior communicating artery is patent. MCA bifurcations are within normal limits bilaterally. The ACA and MCA branch vessels are within normal limits bilaterally. Posterior circulation: The left vertebral  artery is dominant vessel. PICA origins are visualized and normal. Vertebrobasilar junction and basilar artery is normal. Both posterior cerebral arteries originate from basilar tip. The PCA branch vessels are normal. Venous sinuses: The dural sinuses are patent. The straight sinus and deep cerebral veins are intact. Cortical veins are within normal limits. No significant vascular malformation is evident. Anatomic variants: None Review of the MIP images confirms the above findings IMPRESSION: 1. No emergent large vessel occlusion. 2. Minimal atherosclerotic changes within the proximal right ICA and cavernous internal carotid arteries bilaterally without significant stenosis. 3. Multiple sclerotic lesions throughout the cervical and upper thoracic spine and ribs bilaterally consistent with metastatic disease. No pathologic fractures are present. Electronically Signed   By: San Morelle M.D.   On: 06/14/2022 18:26   CT HEAD WO CONTRAST (5MM)  Result Date: 06/14/2022 CLINICAL DATA:  Abnormal CT of the head. Delayed images were obtained after CTA head to evaluate for possible metastatic disease. EXAM: CT HEAD WITHOUT CONTRAST TECHNIQUE: Contiguous axial images were obtained from the base of the skull through the vertex without intravenous contrast. RADIATION DOSE REDUCTION: This exam was performed according to the departmental dose-optimization program which includes automated exposure control, adjustment of the mA and/or kV according to patient size and/or use of iterative reconstruction technique. COMPARISON:  CT head without contrast 06/14/2022 at 2:50 p.m. FINDINGS: Brain: Delayed images demonstrate no pathologic enhancement. Infarcts involving the posterior right parietal lobe and left occipital lobe are again seen. Central left cerebellar infarct is better visualized. Mild atrophy and white matter changes are stable. No significant interval change is present. Vascular: Normal intravascular enhancement  is present. Skull: Calvarium is intact. No focal lytic or blastic lesions are present. No significant extracranial soft tissue lesion is present. Sinuses/Orbits: The paranasal sinuses and mastoid air cells are clear. The globes and orbits are within normal limits. IMPRESSION: 1. No pathologic enhancement to suggest metastatic disease. 2. Stable infarcts involving the posterior right parietal lobe and left occipital lobe. 3. Central left cerebellar infarct is better visualized. 4. Stable atrophy and white matter disease. This likely reflects the sequela of chronic microvascular ischemia. Electronically Signed   By: San Morelle M.D.   On: 06/14/2022 18:14   CT HEAD CODE STROKE WO CONTRAST  Result Date: 06/14/2022 CLINICAL DATA:  Code stroke. Neuro deficit, acute, stroke suspected. Right-sided weakness. EXAM: CT HEAD WITHOUT CONTRAST TECHNIQUE: Contiguous axial images were obtained from the base of the skull through the vertex  without intravenous contrast. RADIATION DOSE REDUCTION: This exam was performed according to the departmental dose-optimization program which includes automated exposure control, adjustment of the mA and/or kV according to patient size and/or use of iterative reconstruction technique. COMPARISON:  . FINDINGS: Brain: Focal hypoattenuation is present in the posterior right parietal lobe measuring up to 2.5 cm. Focal cortical hypoattenuation is also present in the posterior left occipital lobe on image 12 series 3 and image 41 of series 7. No other acute or focal cortical abnormalities are present. Deep brain nuclei are within normal limits. Insular ribbon is normal bilaterally. Cavum septum pellucidum noted. Ventricles are otherwise within normal limits. No significant extraaxial fluid collection is present. The brainstem and cerebellum are within normal limits. Vascular: No hyperdense vessel or unexpected calcification. Skull: Calvarium is intact. No focal lytic or blastic lesions are  present. No significant extracranial soft tissue lesion is present. Sinuses/Orbits: The paranasal sinuses and mastoid air cells are clear. The globes and orbits are within normal limits. ASPECTS [facility] Stroke Program Early CT Score) - Ganglionic level infarction (caudate, lentiform nuclei, internal capsule, insula, M1-M3 cortex): 6/7 - Supraganglionic infarction (M4-M6 cortex): 3/3 Total score (0-10 with 10 being normal): 9/10 IMPRESSION: 1. Focal hypoattenuation in the posterior right parietal lobe measuring up to 2.5 cm. This is concerning for acute or subacute infarct. 2. Focal cortical hypoattenuation in the posterior left occipital lobe is also concerning for acute or subacute infarct. 3. No other acute or focal cortical abnormalities. 4. Aspects is 9/10. The above was relayed via text pager to Dr. Lyn Records on 06/14/2022 at 15:08. Page Electronically Signed   By: San Morelle M.D.   On: 06/14/2022 15:09   DG CHEST PORT 1 VIEW  Result Date: 06/13/2022 CLINICAL DATA:  Encounter for SOB [id number] SOB (shortness of breath) [id number] EXAM: PORTABLE CHEST 1 VIEW COMPARISON:  06/26/2014 FINDINGS: LEFT-sided pacemaker overlies normal cardiac silhouette. No effusion, infiltrate or pneumothorax. Versus arthroplasty LEFT shoulder. IMPRESSION: No acute cardiopulmonary process. Electronically Signed   By: Suzy Bouchard M.D.   On: 06/13/2022 14:34     PHYSICAL EXAM  Temp:  [97.6 F (36.4 C)-98 F (36.7 C)] 97.7 F (36.5 C) (12/10 1200) Pulse Rate:  [62-86] 73 (12/10 1200) Resp:  [12-26] 17 (12/10 1200) BP: (103-144)/(57-91) 109/63 (12/10 1200) SpO2:  [93 %-100 %] 96 % (12/10 1200) Arterial Line BP: (124-144)/(48-68) 140/58 (12/10 1200)  General - Well nourished, well developed, in no apparent distress.  Ophthalmologic - fundi not visualized due to noncooperation.  Cardiovascular - Regular rhythm and rate, not in afib.  Neuro - lethargic and drowsy but easily arousable. He is orientated to age,  place, time and people. No aphasia, paucity of language, following all simple commands. Able to name and repeat. Right forced gaze, not able to cross midline, not cooperative with visual field testing but inconsistent blinking to visual threat on the left, PERRL. Left facial droop. Tongue midline. RUE 4/5 and RLE 3/5, LUE flexion posturing with chronic limited ROM of left shoulder, no spontaneous movement. LLE slight withdraw to pain stimulation. Sensation subjectively symmetrical bilaterally but on testing showed left sided sensory loss, right FTN slow but no ataxia, gait not tested.  .   ASSESSMENT/PLAN Mr. DIVIT STIPP is a 71 y.o. 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 No tPA given due to  recent stroke and thrombocytopenia.  IR pursued  Stroke:  large right MCA infarct due to right M1 occlusion status post IR with TICI3 reperfusion, embolic secondary to new diagnosed A-fib /cardiomyopathy with low EF not on AC versus hypercoagulable state from advanced cancer CT new left cerebellum and right parietal infarcts, subacute right parietal and left occipital infarcts with petechial HT at right parietal. CT head and neck distal right M1 occlusion MRI not able to perform due to incompatible pacemaker Status post IR with TICI3 reperfusion Post IR CT no HT CT repeat 12/10 AM showed extensive right MCA territory infarct with patchy petechial hemorrhage.  Known recent infarct in the left cerebellum and cerebrum LDL 53 SCDs for VTE prophylaxis No antithrombotic prior to admission, now on No antithrombotic due to severe anemia and thrombocytopenia Ongoing aggressive stroke risk factor management Therapy recommendations: CIR Disposition: Pending  Recent stroke 06/14/2022 admitted  for right arm ataxia, hematuria.  CT showed left occipital and right parietal infarct.  Also found to have new diagnosed A-fib.  CTA head and neck no LVO, metastasis lesion in skull.  EF 30%, LDL 98, A1c 5.6.  No antithrombotics due to severe anemia and thrombocytopenia.  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 Urology on board Status post cystoscopy 12/8 Chemo related pancytopenia Bladder irrigation with transfusion as needed  Severe anemia Severe thrombocytopenia Hb 9.0-6.9-8.8-7.9 Status post 4 unit PRBC this admission Platelet [id number] Status post 12 platelet transfusion this admission CBC monitoring Transfusion as needed  Hypertension Stable BP goal 120-140 within 24 hours of IR Long term BP goal normotensive  Hyperlipidemia Home meds: Lipitor 20 LDL 53, goal < 70 Now on Lipitor 20 Continue statin at discharge  Dysphagia Speech on board Passed a swallow on dysphagia 3 and nectar thick liquid Aspiration precaution  Other Stroke Risk Factors Advanced age  Other Active Problems Complete heart block s/p pacemaker  Hospital day # 10  This patient is critically ill due to right large mca infarct, HT, hematuria, PAF, advanced cancer and at significant risk of neurological worsening, death form recurrent stroke, hemorrhagic conversion, brain herniation, severe bleeding. This patient's care requires constant monitoring of vital signs, hemodynamics, respiratory and cardiac monitoring, review of multiple databases, neurological assessment, discussion with family, other specialists and medical decision making of high complexity. I spent 45 minutes of neurocritical care time in the care of this patient.  Rosalin Hawking, MD PhD Stroke Neurology 06/22/2022 12:58 PM    To contact Stroke Continuity provider, please refer to [email or web address] After hours, contact General Neurology

## 2022-06-22 NOTE — Progress Notes (Addendum)
Late entry:  Around 4:30pm, RN reported pt weaker on the left and not quite following commands on the left hand. Fluctuating mental status. Had stat CT showed:  1. Progressive low-density and swelling within the right middle cerebral artery territory, primarily inferior division. Increased mass effect with right-to-left shift of 3.5 mm. 2. Slight increase in petechial hemorrhage within the right parietal lobe infarction. The largest single petechial accumulation measures 6 mm. No large confluent hematoma. Certainly, continued close observation is warranted.  EEG also done showed consistent with a focal cerebral dysfunction of the right hemisphere consistent with patient's known stroke in the setting of a more generalized nonspecific cerebral dysfunction (encephalopathy).   Will continue ICU care and monitoring. Repeat CT in am. BP goal < 160 given hemorrhagic conversion. Na 132, monitor CBC and BMP.   Rosalin Hawking, MD PhD Stroke Neurology 06/22/2022 10:19 PM

## 2022-06-23 ENCOUNTER — Inpatient Hospital Stay (HOSPITAL_COMMUNITY): Payer: Medicare PPO

## 2022-06-23 DIAGNOSIS — I63511 Cerebral infarction due to unspecified occlusion or stenosis of right middle cerebral artery: Secondary | ICD-10-CM | POA: Diagnosis not present

## 2022-06-23 DIAGNOSIS — G935 Compression of brain: Secondary | ICD-10-CM

## 2022-06-23 DIAGNOSIS — D61818 Other pancytopenia: Secondary | ICD-10-CM | POA: Diagnosis not present

## 2022-06-23 DIAGNOSIS — G936 Cerebral edema: Secondary | ICD-10-CM

## 2022-06-23 DIAGNOSIS — R310 Gross hematuria: Secondary | ICD-10-CM | POA: Diagnosis not present

## 2022-06-23 LAB — CBC
HCT: 19.7 % — ABNORMAL LOW (ref 39.0–52.0)
Hemoglobin: 6.7 g/dL — CL (ref 13.0–17.0)
MCH: 29.1 pg (ref 26.0–34.0)
MCHC: 34 g/dL (ref 30.0–36.0)
MCV: 85.7 fL (ref 80.0–100.0)
Platelets: 21 10*3/uL — CL (ref 150–400)
RBC: 2.3 MIL/uL — ABNORMAL LOW (ref 4.22–5.81)
RDW: 17 % — ABNORMAL HIGH (ref 11.5–15.5)
WBC: 3.6 10*3/uL — ABNORMAL LOW (ref 4.0–10.5)
nRBC: 0.8 % — ABNORMAL HIGH (ref 0.0–0.2)

## 2022-06-23 LAB — COMPREHENSIVE METABOLIC PANEL
ALT: 19 U/L (ref 0–44)
AST: 41 U/L (ref 15–41)
Albumin: 1.6 g/dL — ABNORMAL LOW (ref 3.5–5.0)
Alkaline Phosphatase: 75 U/L (ref 38–126)
Anion gap: 11 (ref 5–15)
BUN: 39 mg/dL — ABNORMAL HIGH (ref 8–23)
CO2: 18 mmol/L — ABNORMAL LOW (ref 22–32)
Calcium: 6.3 mg/dL — CL (ref 8.9–10.3)
Chloride: 109 mmol/L (ref 98–111)
Creatinine, Ser: 1.37 mg/dL — ABNORMAL HIGH (ref 0.61–1.24)
GFR, Estimated: 54 mL/min — ABNORMAL LOW (ref >60)
Glucose, Bld: 142 mg/dL — ABNORMAL HIGH (ref 70–99)
Potassium: 4.1 mmol/L (ref 3.5–5.1)
Sodium: 138 mmol/L (ref 135–145)
Total Bilirubin: 0.5 mg/dL (ref 0.3–1.2)
Total Protein: 4.4 g/dL — ABNORMAL LOW (ref 6.5–8.1)

## 2022-06-23 LAB — GLUCOSE, CAPILLARY
Glucose-Capillary: 146 mg/dL — ABNORMAL HIGH (ref 70–99)
Glucose-Capillary: 158 mg/dL — ABNORMAL HIGH (ref 70–99)

## 2022-06-23 LAB — SODIUM: Sodium: 135 mmol/L (ref 135–145)

## 2022-06-23 MED ORDER — MORPHINE BOLUS VIA INFUSION
4.0000 mg | Freq: Once | INTRAVENOUS | Status: AC
  Administered 2022-06-23: 4 mg via INTRAVENOUS
  Filled 2022-06-23: qty 4

## 2022-06-23 MED ORDER — MORPHINE 100MG IN NS 100ML (1MG/ML) PREMIX INFUSION
1.0000 mg/h | INTRAVENOUS | Status: DC
  Administered 2022-06-23: 1 mg/h via INTRAVENOUS
  Filled 2022-06-23: qty 100

## 2022-06-23 MED ORDER — POLYVINYL ALCOHOL 1.4 % OP SOLN: 1.0000 [drp] | Freq: Four times a day (QID) | OPHTHALMIC | Status: DC | PRN

## 2022-06-23 MED ORDER — HALOPERIDOL LACTATE 5 MG/ML IJ SOLN: 0.5000 mg | INTRAMUSCULAR | Status: DC | PRN

## 2022-06-23 MED ORDER — ONDANSETRON HCL 4 MG/2ML IJ SOLN: 4.0000 mg | Freq: Four times a day (QID) | INTRAMUSCULAR | Status: DC | PRN

## 2022-06-23 MED ORDER — GLYCOPYRROLATE 1 MG PO TABS: 1.0000 mg | ORAL_TABLET | ORAL | Status: DC | PRN

## 2022-06-23 MED ORDER — HALOPERIDOL LACTATE 2 MG/ML PO CONC: 0.5000 mg | ORAL | Status: DC | PRN

## 2022-06-23 MED ORDER — ACETAMINOPHEN 650 MG RE SUPP: 650.0000 mg | Freq: Four times a day (QID) | RECTAL | Status: DC | PRN

## 2022-06-23 MED ORDER — GLYCOPYRROLATE 0.2 MG/ML IJ SOLN
0.2000 mg | INTRAMUSCULAR | Status: DC | PRN
  Administered 2022-06-23 (×2): 0.2 mg via INTRAVENOUS
  Filled 2022-06-23 (×2): qty 1

## 2022-06-23 MED ORDER — GLYCOPYRROLATE 0.2 MG/ML IJ SOLN: 0.2000 mg | INTRAMUSCULAR | Status: DC | PRN

## 2022-06-23 MED ORDER — ACETAMINOPHEN 325 MG PO TABS: 650.0000 mg | ORAL_TABLET | Freq: Four times a day (QID) | ORAL | Status: DC | PRN

## 2022-06-23 MED ORDER — HALOPERIDOL 0.5 MG PO TABS: 0.5000 mg | ORAL_TABLET | ORAL | Status: DC | PRN

## 2022-06-23 MED ORDER — ONDANSETRON 4 MG PO TBDP: 4.0000 mg | ORAL_TABLET | Freq: Four times a day (QID) | ORAL | Status: DC | PRN

## 2022-06-23 MED ORDER — BIOTENE DRY MOUTH MT LIQD: 15.0000 mL | OROMUCOSAL | Status: DC | PRN

## 2022-06-26 ENCOUNTER — Other Ambulatory Visit (HOSPITAL_BASED_OUTPATIENT_CLINIC_OR_DEPARTMENT_OTHER): Payer: Self-pay

## 2022-06-27 ENCOUNTER — Ambulatory Visit: Payer: Medicare PPO | Admitting: Internal Medicine

## 2022-07-01 ENCOUNTER — Encounter (HOSPITAL_BASED_OUTPATIENT_CLINIC_OR_DEPARTMENT_OTHER): Payer: Medicare PPO | Admitting: General Surgery

## 2022-07-11 LAB — TRANSFUSION REACTION
DAT C3: POSITIVE
Post RXN DAT IgG: NEGATIVE

## 2022-07-14 NOTE — Progress Notes (Signed)
Chaplain called for added emotional support during this time. Appreciate his involvement as does family.

## 2022-07-14 NOTE — Progress Notes (Signed)
   02-Jul-2022 1600  Clinical Encounter Type  Visited With Patient and family together  Visit Type Initial;Spiritual support  Referral From Chaplain  Consult/Referral To Rew responded to a request to follow up from the overnight chaplain.  The patient was surrounded by family and friends. His priest had been called and is on the way.  Family was grieving appropriately and waiting. I offered to return if they wished someone to provide support later in the evening otherwise they were caring for each other.   Danice Goltz [facility]  [phone]

## 2022-07-14 NOTE — Progress Notes (Signed)
Referring Physician(s): Code Stroke  Supervising Physician: Luanne Bras  Patient Status:  [facility] - In-pt  Chief Complaint: Follow up right MCA thrombectomy 07/03/2022  Subjective:  Patient seen at bedside, RN and several family members present. Per family member patient was drowsy but responding appropriately to simple questions around 8 pm last night however by 11 pm he was no longer responding and was brought down for a CT. They noted his pupils to be uneven as well. He has been sleeping since that time but does become agitated when being moved in bed. CT imaging reviewed with family by Dr. Estanislado Pandy this morning.  Allergies: Patient has no known allergies.  Medications: Prior to Admission medications   Medication Sig Start Date End Date Taking? Authorizing Provider  acetaminophen (TYLENOL) 325 MG tablet Take 650 mg by mouth every 6 (six) hours as needed for mild pain or headache.   Yes [provider]  amoxicillin (AMOXIL) 500 MG capsule Take 4 capsules by mouth 1 hour prior to dental appointment for pre-medication. Patient taking differently: Take 2,000 mg by mouth See admin instructions. Take 2,000 mg by mouth one hour prior to dental appointments for pre-medication 02/17/22  Yes   atorvastatin (LIPITOR) 20 MG tablet Take 1 tablet by mouth once daily Patient taking differently: Take 20 mg by mouth daily. 07/29/21  Yes [name], MD  calcium carbonate (OS-CAL - DOSED IN MG OF ELEMENTAL CALCIUM) 1250 (500 Ca) MG tablet Take 1 tablet by mouth 2 (two) times a week.   Yes [provider]  denosumab (XGEVA) 120 MG/1.7ML SOLN injection Inject 120 mg into the skin every 2 (two) months.   Yes [provider]  docusate sodium (COLACE) 100 MG capsule Take 100 mg by mouth daily as needed for mild constipation.   Yes [provider]  ferrous sulfate 325 (65 FE) MG tablet Take 325 mg by mouth daily with breakfast.   Yes [provider]   HYDROcodone-acetaminophen (NORCO/VICODIN) 5-325 MG tablet Take 1 tablet by mouth at bedtime as needed. 05/27/2022  Yes   leuprolide, 6 Month, (ELIGARD) 45 MG injection Inject 45 mg into the skin every 6 (six) months.   Yes [provider]  predniSONE (DELTASONE) 10 MG tablet Take 1 tablet (10 mg total) by mouth every morning. 05/01/22  Yes   prochlorperazine (COMPAZINE) 10 MG tablet Take 1 tablet (10 mg total) by mouth every 6 (six) hours as needed as needed for nausea or vomiting. 05/01/22  Yes   traMADol (ULTRAM) 50 MG tablet Take 1 tablet (50 mg total) by mouth every 8 (eight) hours as needed for Pain for up to 20 days 05/27/22  Yes   valsartan (DIOVAN) 160 MG tablet Take 1 tablet (160 mg total) by mouth daily. 10/10/21  Yes [name], MD     Vital Signs: BP 130/73   Pulse 93   Temp 99 F (37.2 C) (Axillary)   Resp (!) 21   Ht 6' (1.829 m)   Wt 227 lb 1.2 oz (103 kg)   SpO2 98%   BMI 30.80 kg/m   Physical Exam Vitals and nursing note reviewed.  Constitutional:      Appearance: He is ill-appearing.     Comments: Somnolent, does not arouse to verbal/tactile cues. Becomes agitated when eyes are held open and limbs are moved.  HENT:     Head: Normocephalic.  Cardiovascular:     Rate and Rhythm: Normal rate.  Pulmonary:  Comments: Increased respiratory effort, wet cough/breathing. Skin:    General: Skin is warm and dry.  Neurological:     Comments: Unable to follow commands     Imaging: CT HEAD WO CONTRAST (5MM)  Result Date: 06/22/2022 CLINICAL DATA:  Follow-up examination for stroke. EXAM: CT HEAD WITHOUT CONTRAST TECHNIQUE: Contiguous axial images were obtained from the base of the skull through the vertex without intravenous contrast. RADIATION DOSE REDUCTION: This exam was performed according to the departmental dose-optimization program which includes automated exposure control, adjustment of the mA and/or kV according to patient size and/or use of  iterative reconstruction technique. COMPARISON:  Prior study from earlier the same day as well as earlier exams. FINDINGS: Brain: Evolving cytotoxic edema involving the posterior right cerebral hemisphere, consistent with large evolving ischemic infarct. This is primarily right MCA distribution involvement, although there may be some ACA and/or PCA involvement as well. Progressive mass effect with worsened right to left shift, now measuring up to approximately 12 mm. Increased dilatation of the left lateral ventricle concerning for trapping. Associated basilar cistern crowding with right uncal herniation. No transtentorial herniation identified. Associated hemorrhage within the area of infarction has increased and worsened. Dominant hematoma now measures approximately 3.3 x 4.1 x 3.4 cm (estimated volume 23 mL) ( Heidelberg classification 2: Hematoma occupying >30% of the infarcted tissue with obvious mass effect. Interval development of a small subdural hygroma overlying the right cerebral convexity measuring up to 9 mm in thickness (series 7, image 37). Smaller of all vein acute left cerebellar infarcts are increased in prominence from prior exam. Vascular: No abnormal hyperdense vessel. Calcified atherosclerosis present at the skull base. Skull: Scalp soft tissues and calvarium demonstrate no new finding. Sinuses/Orbits: Globes and orbital soft tissues demonstrate no acute finding. Paranasal sinuses are largely clear. No mastoid effusion. Other: Benign lipoma noted within the right postauricular region. IMPRESSION: 1. Evolving cytotoxic edema involving the posterior right cerebral hemisphere, consistent with large evolving ischemic infarct. Progressive regional mass effect with worsened right to left shift, now measuring up to approximately 12 mm. Increased dilatation of the left lateral ventricle consistent with trapping. 2. Associated hemorrhage within the area of infarction has increased and worsened. Dominant  hematoma now measures approximately 23 mL ( Heidelberg classification 2: Hematoma occupying 30% of the infarcted tissue with obvious mass effect. 3. Interval development of a small subdural hygroma overlying the right cerebral convexity measuring up to 9 mm in thickness. 4. Additional smaller evolving recent left cerebellar infarcts, similar to prior. Critical Value/emergent results were called by telephone at the time of interpretation on 06/22/2022 at 11:55 pm to provider [facility] , who verbally acknowledged these results. Electronically Signed   By: Jeannine Boga M.D.   On: 06/22/2022 23:59   EEG adult  Result Date: 06/22/2022 Greta Doom, MD     06/22/2022  9:20 PM History: 72 yo M with r MCA infarct, evaluate for seizure Sedation: none Technique: This EEG was acquired with electrodes placed according to the International 10-20 electrode system (including Fp1, Fp2, F3, F4, C3, C4, P3, P4, O1, O2, T3, T4, T5, T6, A1, A2, Fz, Cz, Pz). The following electrodes were missing or displaced: none. Background: There is a poorly formed and poorly sustained posterior dominant rhythm of 7 to 8 Hz which is better seen on the left than right.  In addition, there is attenuation of normal faster frequencies on the right compared to the left.  There is also mild generalized irregular slow activity  that is diffusely distributed. Photic stimulation: Physiologic driving is not performed EEG Abnormalities: 1) asymmetric fast activity with attenuation of the right 2) slow posterior dominant rhythm 3) generalized irregular slow activity Clinical Interpretation: This EEG is consistent with a focal cerebral dysfunction of the right hemisphere consistent with patient's known stroke in the setting of a more generalized nonspecific cerebral dysfunction (encephalopathy). There was no seizure or seizure predisposition recorded on this study. Please note that lack of epileptiform activity on EEG does not  preclude the possibility of epilepsy. Roland Rack, MD [facility] [phone] If 7pm- 7am, please page neurology on call as listed in [place].   CT HEAD WO CONTRAST (5MM)  Result Date: 06/22/2022 CLINICAL DATA:  Follow-up stroke EXAM: CT HEAD WITHOUT CONTRAST TECHNIQUE: Contiguous axial images were obtained from the base of the skull through the vertex without intravenous contrast. RADIATION DOSE REDUCTION: This exam was performed according to the departmental dose-optimization program which includes automated exposure control, adjustment of the mA and/or kV according to patient size and/or use of iterative reconstruction technique. COMPARISON:  Earlier same day 0920 hours FINDINGS: Brain: Acute infarctions within the cerebellum appear unchanged. No new or progressive posterior fossa infarction. No hemorrhage. Progressive low-density and swelling within the right middle cerebral artery territory, primarily inferior division. Increased mass effect with right-to-left shift of 3.5 mm. Slight increase in petechial hemorrhage within the right parietal lobe. The largest single petechial accumulation measures 6 mm, axial image 21. No large confluent hematoma. Small area of cortical low density at the left frontoparietal junction appears similar. No sign of bleeding on the left. Vascular: No new vascular finding. Skull: Normal Sinuses/Orbits: Clear/normal Other: None IMPRESSION: 1. Progressive low-density and swelling within the right middle cerebral artery territory, primarily inferior division. Increased mass effect with right-to-left shift of 3.5 mm. 2. Slight increase in petechial hemorrhage within the right parietal lobe infarction. The largest single petechial accumulation measures 6 mm. No large confluent hematoma. Certainly, continued close observation is warranted. Electronically Signed   By: Nelson Chimes M.D.   On: 06/22/2022 17:14   CT HEAD WO CONTRAST  Result Date:  06/22/2022 CLINICAL DATA:  Stroke follow-up EXAM: CT HEAD WITHOUT CONTRAST TECHNIQUE: Contiguous axial images were obtained from the base of the skull through the vertex without intravenous contrast. RADIATION DOSE REDUCTION: This exam was performed according to the departmental dose-optimization program which includes automated exposure control, adjustment of the mA and/or kV according to patient size and/or use of iterative reconstruction technique. COMPARISON:  Head CT from yesterday FINDINGS: Brain: Large area of cytotoxic edema in the posterior division of the right MCA territory with patchy petechial hemorrhage. Localized swelling. Unchanged left cerebellar infarct which is recent. More subacute left occipital cortex infarct. No hydrocephalus or collection. Vascular: No hyperdense vessel. Skull: Normal. Negative for fracture or focal lesion. Sinuses/Orbits: No acute finding. IMPRESSION: 1. Extensive right MCA territory infarct with patchy petechial hemorrhage. 2. Known recent infarcts in the left cerebrum and cerebellum. Electronically Signed   By: Jorje Guild M.D.   On: 06/22/2022 09:39   CT ANGIO HEAD NECK W WO CM (CODE STROKE)  Result Date: 07/11/2022 CLINICAL DATA:  Stroke suspected EXAM: CT ANGIOGRAPHY HEAD AND NECK TECHNIQUE: Multidetector CT imaging of the head and neck was performed using the standard protocol during bolus administration of intravenous contrast. Multiplanar CT image reconstructions and MIPs were obtained to evaluate the vascular anatomy. Carotid stenosis measurements (when applicable) are obtained utilizing NASCET criteria, using the distal internal carotid diameter  as the denominator. RADIATION DOSE REDUCTION: This exam was performed according to the departmental dose-optimization program which includes automated exposure control, adjustment of the mA and/or kV according to patient size and/or use of iterative reconstruction technique. CONTRAST:  37m OMNIPAQUE IOHEXOL 350  MG/ML SOLN COMPARISON:  CTA head neck 06/14/2022, correlation is also made with same day CT head FINDINGS: CT HEAD FINDINGS For noncontrast findings, please see same day CT head. CTA NECK FINDINGS Aortic arch: Standard branching. Imaged portion shows no evidence of aneurysm or dissection. No significant stenosis of the major arch vessel origins. Right carotid system: No evidence of dissection, occlusion, or hemodynamically significant stenosis (greater than 50%). Left carotid system: No evidence of dissection, occlusion, or hemodynamically significant stenosis (greater than 50%). Vertebral arteries: No evidence of dissection, occlusion, or hemodynamically significant stenosis (greater than 50%). Skeleton: Redemonstrated multifocal sclerotic lesions in cervical and upper thoracic spine. No evidence of pathologic fracture. Other neck: Negative. Upper chest: Small bilateral pleural effusions with associated atelectasis, new from the prior exam. Review of the MIP images confirms the above findings CTA HEAD FINDINGS Anterior circulation: Both internal carotid arteries are patent to the termini, without significant stenosis. A1 segments patent. Normal anterior communicating artery. Anterior cerebral arteries are patent to their distal aspects. Occlusion of the distal right M1 (series 7, image 93), without evidence of reconstitution or flow in the right MCA territory. No left M1 stenosis or occlusion. The left MCA vessels branches are perfused without significant stenosis. Posterior circulation: Vertebral arteries patent to the vertebrobasilar junction without stenosis. Basilar patent to its distal aspect. Superior cerebellar arteries patent proximally. Patent P1 segments. PCAs perfused to their distal aspects without stenosis. The right posterior communicating artery is patent. Venous sinuses: As permitted by contrast timing, patent. Anatomic variants: None significant. Review of the MIP images confirms the above findings  IMPRESSION: 1. Occlusion of the distal right M1, without evidence of reconstitution or flow in the right MCA territory. 2. No hemodynamically significant stenosis in the neck. 3. Small bilateral pleural effusions with associated atelectasis, new from the prior exam. 4. Redemonstrated multifocal sclerotic lesions in the cervical and upper thoracic spine, consistent with osseous metastatic disease. No evidence of pathologic fracture. Code stroke imaging results were communicated on 06/17/2022 at 10:10 pm to provider Dr. KLorrin Goodellvia secure text paging. Electronically Signed   By: AMerilyn BabaM.D.   On: 06/28/2022 22:17   CT HEAD CODE STROKE WO CONTRAST  Result Date: 06/17/2022 CLINICAL DATA:  Code stroke. EXAM: CT HEAD WITHOUT CONTRAST TECHNIQUE: Contiguous axial images were obtained from the base of the skull through the vertex without intravenous contrast. RADIATION DOSE REDUCTION: This exam was performed according to the departmental dose-optimization program which includes automated exposure control, adjustment of the mA and/or kV according to patient size and/or use of iterative reconstruction technique. COMPARISON:  06/15/2022 FINDINGS: Brain: New area of hypodensity in the left mid to inferior cerebellum (series 2, image 6 and series 6, image 39), concerning for acute infarct. 7 mm focus of hyperdensity in the right occipital lobe, associated with an area of hypodensity and loss of gray-white differentiation, concerning for a small area of hemorrhagic transformation. Redemonstrated areas of loss of gray-white differentiation in the left occipital lobe without evidence of hemorrhagic transformation, but a possible additional area of hypodense in the adjacent left parietal lobe (series 2, image 17). No evidence of mass, mass effect, or midline shift. No hydrocephalus or extra-axial collection. Vascular: No hyperdense vessel. Skull: Negative for fracture  or focal lesion. Sinuses/Orbits: Mild mucosal  thickening in the left maxillary sinus. The orbits are unremarkable. Other: The mastoid air cells are well aerated. ASPECTS ([place] Stroke Program Early CT Score) - Ganglionic level infarction (caudate, lentiform nuclei, internal capsule, insula, M1-M3 cortex): 7 - Supraganglionic infarction (M4-M6 cortex): 3 Total score (0-10 with 10 being normal): 10 IMPRESSION: 1. New area of hypodensity in the left mid to inferior cerebellum, concerning for acute infarct. 2. 7 mm focus of hyperdensity in the right occipital lobe in an area of prior parieto-occipital infarct, concerning for a small area of hemorrhagic transformation. 3. New area of hypodensity in the left parietal lobe, concerning for additional acute infarct, adjacent to a previously noted area of infarct in the left occipital lobe, without evidence of hemorrhagic transformation. Code stroke imaging results were communicated on 06/30/2022 at 10:06 pm to provider Dr. Lorrin Goodell via secure text paging. Electronically Signed   By: Merilyn Baba M.D.   On: 06/13/2022 22:06   CT ABDOMEN PELVIS W CONTRAST  Result Date: 06/13/2022 CLINICAL DATA:  Metastatic prostate carcinoma bone pain EXAM: CT ABDOMEN AND PELVIS WITH CONTRAST TECHNIQUE: Multidetector CT imaging of the abdomen and pelvis was performed using the standard protocol following bolus administration of intravenous contrast. RADIATION DOSE REDUCTION: This exam was performed according to the departmental dose-optimization program which includes automated exposure control, adjustment of the mA and/or kV according to patient size and/or use of iterative reconstruction technique. CONTRAST:  18m OMNIPAQUE IOHEXOL 350 MG/ML SOLN COMPARISON:  CT 09/26/2019 bone scan 09/26/2019 FINDINGS: Lower chest: Bilateral layering effusions with lower lobe mild passive atelectasis. Increased bilateral gynecomastia. Hepatobiliary: Benign cyst in the LEFT hepatic lobe. Several gallstones within the lumen the gallbladder. No  gallbladder inflammation. Pancreas: Pancreas is normal. No ductal dilatation. No pancreatic inflammation. Spleen: Normal spleen Adrenals/urinary tract: Adrenal glands and kidneys normal. No ureterolithiasis. Bladder is decompressed around a Foley catheter. Stomach/Bowel: Stomach, small bowel, appendix, and cecum are normal. The colon and rectosigmoid colon are normal. Vascular/Lymphatic: Abdominal aorta is normal caliber. No periportal or retroperitoneal adenopathy. No pelvic adenopathy. Reproductive: Post prostatectomy Other: No pelvic lymphadenopathy. Musculoskeletal: Multiple sclerotic lesions in the pelvis and spine. The number of lesions is increased from [place]. Sclerotic lesion at L 1 is similar but he multiple new lesions in the spine and pelvis noted. Example new sclerotic lesion the central sacrum measuring 23 mm on image 68/3. Lesion in the RIGHT iliac wing measuring 12 mm on image 65/3. Approximately 40 lesions noted. IMPRESSION: 1. Progression sclerotic skeletal metastasis compared to CT 09/26/2019. 2. No pelvic or retroperitoneal lymphadenopathy. 3. Increase in gynecomastia compared to prior. 4. Cholelithiasis without evidence cholecystitis. Electronically Signed   By: SSuzy BouchardM.D.   On: 07/01/2022 09:42   CT L-SPINE NO CHARGE  Result Date: 06/13/2022 CLINICAL DATA:  Progressive lumbar pain EXAM: CT LUMBAR SPINE WITHOUT CONTRAST TECHNIQUE: Multidetector CT imaging of the lumbar spine was performed without intravenous contrast administration. Multiplanar CT image reconstructions were also generated. RADIATION DOSE REDUCTION: This exam was performed according to the departmental dose-optimization program which includes automated exposure control, adjustment of the mA and/or kV according to patient size and/or use of iterative reconstruction technique. COMPARISON:  CT abdomen 09/14/2014 FINDINGS: Segmentation: 5 lumbar type vertebrae. Alignment: Minimal retrolisthesis of L2 on L3 and L3 on  L4. Vertebrae: No acute fracture or subluxation. Sclerotic bone lesions throughout the visualized thoracolumbar spine, sacrum, and ilium concerning for metastatic disease. Paraspinal and other soft tissues: No acute paraspinal  abnormality. Mild abdominal aortic atherosclerosis. Other: None Disc levels: Disc spaces: Degenerative disease with disc height loss at L3-4. T12-L1: Minimal broad-based disc bulge. Mild bilateral facet arthropathy. No foraminal or central canal stenosis. L1-L2: Mild broad-based disc bulge. Mild bilateral facet arthropathy. No foraminal or central canal stenosis. L2-L3: Broad-based disc bulge. Moderate bilateral facet arthropathy. Mild spinal stenosis. Mild left foraminal stenosis. No right foraminal stenosis. L3-L4: Broad-based disc bulge. Mild bilateral facet arthropathy. Moderate spinal stenosis. Moderate left foraminal stenosis. No right foraminal stenosis. L4-L5: Broad-based disc bulge. Mild bilateral facet arthropathy. Mild bilateral foraminal stenosis, left greater than right. No spinal stenosis. L5-S1: Broad-based disc bulge. Moderate left facet arthropathy. Mild left foraminal stenosis. No right foraminal stenosis. IMPRESSION: 1. No acute osseous injury of the lumbar spine. 2. Sclerotic bone lesions throughout the visualized thoracolumbar spine, sacrum, and ilium concerning for metastatic disease. 3. Diffuse lumbar spine spondylosis as described above. Electronically Signed   By: Kathreen Devoid M.D.   On: 06/22/2022 09:32   DG Abd 1 View  Result Date: 06/19/2022 CLINICAL DATA:  Distension EXAM: ABDOMEN - 1 VIEW COMPARISON:  CT 09/14/2014 FINDINGS: Mild diffuse increased small and large bowel gas. Bilateral hip replacements. Pelvic phleboliths. Amorphous density in the left upper quadrant could represent something in the enteral stream. No definite radiopaque calculi. IMPRESSION: Mild diffuse increased small and large bowel gas suggestive of mild ileus. Electronically Signed   By:  Donavan Foil M.D.   On: 06/19/2022 23:28    Labs:  CBC: Recent Labs    07/12/2022 1702 06/22/2022 0509 06/22/22 0642 2022/06/26 0545  WBC 4.9 4.3 5.1 3.6*  HGB 8.7* 8.5* 7.9* 6.7*  HCT 24.8* 23.4* 23.6* 19.7*  PLT 93* 72* 46* 21*    COAGS: Recent Labs    06/13/22 1901 06/19/2022 0312  INR 1.7* 1.6*  1.5*  APTT  --  30    BMP: Recent Labs    06/24/2022 0312 06/26/2022 0509 06/22/22 0642 June 26, 2022 0050 06/26/22 0545  NA 129* 130* 132* 135 138  K 4.2 4.3 4.0  --  4.1  CL 97* 98 102  --  109  CO2 19* 19* 15*  --  18*  GLUCOSE 115* 156* 156*  --  142*  BUN 29* 30* 41*  --  39*  CALCIUM 6.7* 6.4* 5.9*  --  6.3*  CREATININE 1.27* 1.26* 1.25*  --  1.37*  GFRNONAA 60* >60 >60  --  54*    LIVER FUNCTION TESTS: Recent Labs    04/24/22 1008 06/10/2022 1720 06/22/22 0642 2022-06-26 0545  BILITOT 0.5 0.6 0.4 0.5  AST 14 18 43* 41  ALT '16 11 23 19  '$ ALKPHOS 78 70 86 75  PROT 6.5 6.4* 4.4* 4.4*  ALBUMIN 3.9 3.6 1.6* 1.6*    Assessment and Plan:  72 y/o M s/p right MCA thrombectomy 06/18/2022 in NIR seen today for follow up. Patient noted to have decreased responsiveness overnight and underwent CT head which showed progressive swelling with increased mass effect with right to left shift and increased petechial hemorrhage. CT several hours later noted continued worsening of the above. CTs were reviewed with the family by Dr. Estanislado Pandy at bedside today, all questions answered to their satisfaction.  On exam patient is not arousable however becomes agitated when moved. No concerns noted regarding right CFA puncture site.   No additional NIR needs at this time, further plans per primary team/neurology service.  NIR remains available as needed, please call with questions or concerns.  Electronically Signed: Joaquim Nam, PA-C June 27, 2022, 8:42 AM   I spent a total of 15 Minutes at the the patient's bedside AND on the patient's hospital floor or unit, greater than 50% of which  was counseling/coordinating care for right MCA thrombectomy follow up.

## 2022-07-14 NOTE — Progress Notes (Signed)
An USGPIV (ultrasound guided PIV) has been placed for short-term vasopressor infusion. A correctly placed ivWatch must be used when administering Vasopressors. Should this treatment be needed beyond 72 hours, central line access should be obtained.  It will be the responsibility of the bedside nurse to follow best practice to prevent extravasations.   ?

## 2022-07-14 NOTE — Progress Notes (Signed)
STROKE TEAM PROGRESS NOTE   SUBJECTIVE (INTERVAL HISTORY) His wife and daughter and sons are at the bedside.  Overnight, he had neuro changes with unresponsiveness. CT stat showed large right MCA infarct with significant HT and frank hematoma, significant mass effect with midline shift and herniation. On 3% saline now and code status DNR. I had long discussion with wife Dr. [name] and daughter and sons at bedside, reviewed neuro images, updated pt current condition, treatment plan and potential prognosis, and answered all the questions. They expressed understanding and appreciation. And after serious family discussion, they decided to pursue comfort care measures.    OBJECTIVE Temp:  [98.8 F (37.1 C)-99.9 F (37.7 C)] 99 F (37.2 C) (12/11 0400) Pulse Rate:  [74-95] 93 (12/11 0800) Cardiac Rhythm: A-V Sequential paced (12/11 0800) Resp:  [16-30] 21 (12/11 0700) BP: (106-166)/(58-88) 130/73 (12/11 0800) SpO2:  [96 %-99 %] 98 % (12/11 0800)  Recent Labs  Lab 06/22/22 1146 06/22/22 1554 06/22/22 1931 07/02/22 0030 2022-07-02 0324  GLUCAP 141* 118* 140* 158* 146*   Recent Labs  Lab 06/19/22 0555 06/24/2022 0312 06/15/2022 0509 06/22/22 0642 07/02/22 0050 07/02/22 0545  NA 128* 129* 130* 132* 135 138  K 4.3 4.2 4.3 4.0  --  4.1  CL 100 97* 98 102  --  109  CO2 18* 19* 19* 15*  --  18*  GLUCOSE 116* 115* 156* 156*  --  142*  BUN 25* 29* 30* 41*  --  39*  CREATININE 1.14 1.27* 1.26* 1.25*  --  1.37*  CALCIUM 7.0* 6.7* 6.4* 5.9*  --  6.3*   Recent Labs  Lab 06/22/22 0642 2022-07-02 0545  AST 43* 41  ALT 23 19  ALKPHOS 86 75  BILITOT 0.4 0.5  PROT 4.4* 4.4*  ALBUMIN 1.6* 1.6*   Recent Labs  Lab 06/19/22 2047 07/10/2022 0312 06/17/2022 1702 07/13/2022 0509 06/22/22 0642 07/02/2022 0545  WBC 3.4* 3.9* 4.9 4.3 5.1 3.6*  NEUTROABS 2.3  --   --   --  4.4  --   HGB 6.9* 8.8* 8.7* 8.5* 7.9* 6.7*  HCT 21.0* 24.5* 24.8* 23.4* 23.6* 19.7*  MCV 88.2 81.1 82.9 81.8 84.6 85.7  PLT 14*  31*  30* 93* 72* 46* 21*   No results for input(s): "CKTOTAL", "CKMB", "CKMBINDEX", "TROPONINI" in the last 168 hours. No results for input(s): "LABPROT", "INR" in the last 72 hours.  No results for input(s): "COLORURINE", "LABSPEC", "PHURINE", "GLUCOSEU", "HGBUR", "BILIRUBINUR", "KETONESUR", "PROTEINUR", "UROBILINOGEN", "NITRITE", "LEUKOCYTESUR" in the last 72 hours.  Invalid input(s): "APPERANCEUR"     Component Value Date/Time   CHOL 106 06/22/2022 0642   TRIG 182 (H) 06/22/2022 0642   TRIG 89 05/28/2006 0000   HDL 17 (L) 06/22/2022 0642   CHOLHDL 6.2 06/22/2022 0642   VLDL 36 06/22/2022 0642   LDLCALC 53 06/22/2022 0642   LDLCALC 98 05/01/2020 1456   Lab Results  Component Value Date   HGBA1C 5.6 05/01/2020   No results found for: "LABOPIA", "COCAINSCRNUR", "LABBENZ", "AMPHETMU", "THCU", "LABBARB"  No results for input(s): "ETH" in the last 168 hours.  I have personally reviewed the radiological images below and agree with the radiology interpretations.  CT HEAD WO CONTRAST (5MM)  Result Date: 06/22/2022 CLINICAL DATA:  Follow-up examination for stroke. EXAM: CT HEAD WITHOUT CONTRAST TECHNIQUE: Contiguous axial images were obtained from the base of the skull through the vertex without intravenous contrast. RADIATION DOSE REDUCTION: This exam was performed according to the departmental dose-optimization program which includes  automated exposure control, adjustment of the mA and/or kV according to patient size and/or use of iterative reconstruction technique. COMPARISON:  Prior study from earlier the same day as well as earlier exams. FINDINGS: Brain: Evolving cytotoxic edema involving the posterior right cerebral hemisphere, consistent with large evolving ischemic infarct. This is primarily right MCA distribution involvement, although there may be some ACA and/or PCA involvement as well. Progressive mass effect with worsened right to left shift, now measuring up to approximately 12  mm. Increased dilatation of the left lateral ventricle concerning for trapping. Associated basilar cistern crowding with right uncal herniation. No transtentorial herniation identified. Associated hemorrhage within the area of infarction has increased and worsened. Dominant hematoma now measures approximately 3.3 x 4.1 x 3.4 cm (estimated volume 23 mL) ( Heidelberg classification 2: Hematoma occupying >30% of the infarcted tissue with obvious mass effect. Interval development of a small subdural hygroma overlying the right cerebral convexity measuring up to 9 mm in thickness (series 7, image 37). Smaller of all vein acute left cerebellar infarcts are increased in prominence from prior exam. Vascular: No abnormal hyperdense vessel. Calcified atherosclerosis present at the skull base. Skull: Scalp soft tissues and calvarium demonstrate no new finding. Sinuses/Orbits: Globes and orbital soft tissues demonstrate no acute finding. Paranasal sinuses are largely clear. No mastoid effusion. Other: Benign lipoma noted within the right postauricular region. IMPRESSION: 1. Evolving cytotoxic edema involving the posterior right cerebral hemisphere, consistent with large evolving ischemic infarct. Progressive regional mass effect with worsened right to left shift, now measuring up to approximately 12 mm. Increased dilatation of the left lateral ventricle consistent with trapping. 2. Associated hemorrhage within the area of infarction has increased and worsened. Dominant hematoma now measures approximately 23 mL ( Heidelberg classification 2: Hematoma occupying 30% of the infarcted tissue with obvious mass effect. 3. Interval development of a small subdural hygroma overlying the right cerebral convexity measuring up to 9 mm in thickness. 4. Additional smaller evolving recent left cerebellar infarcts, similar to prior. Critical Value/emergent results were called by telephone at the time of interpretation on 06/22/2022 at 11:55 pm  to provider [facility] , who verbally acknowledged these results. Electronically Signed   By: Jeannine Boga M.D.   On: 06/22/2022 23:59   EEG adult  Result Date: 06/22/2022 Greta Doom, MD     06/22/2022  9:20 PM History: 72 yo M with r MCA infarct, evaluate for seizure Sedation: none Technique: This EEG was acquired with electrodes placed according to the International 10-20 electrode system (including Fp1, Fp2, F3, F4, C3, C4, P3, P4, O1, O2, T3, T4, T5, T6, A1, A2, Fz, Cz, Pz). The following electrodes were missing or displaced: none. Background: There is a poorly formed and poorly sustained posterior dominant rhythm of 7 to 8 Hz which is better seen on the left than right.  In addition, there is attenuation of normal faster frequencies on the right compared to the left.  There is also mild generalized irregular slow activity that is diffusely distributed. Photic stimulation: Physiologic driving is not performed EEG Abnormalities: 1) asymmetric fast activity with attenuation of the right 2) slow posterior dominant rhythm 3) generalized irregular slow activity Clinical Interpretation: This EEG is consistent with a focal cerebral dysfunction of the right hemisphere consistent with patient's known stroke in the setting of a more generalized nonspecific cerebral dysfunction (encephalopathy). There was no seizure or seizure predisposition recorded on this study. Please note that lack of epileptiform activity on EEG does not preclude  the possibility of epilepsy. Roland Rack, MD [facility] [phone] If 7pm- 7am, please page neurology on call as listed in [place].   CT HEAD WO CONTRAST (5MM)  Result Date: 06/22/2022 CLINICAL DATA:  Follow-up stroke EXAM: CT HEAD WITHOUT CONTRAST TECHNIQUE: Contiguous axial images were obtained from the base of the skull through the vertex without intravenous contrast. RADIATION DOSE REDUCTION: This exam was performed according to  the departmental dose-optimization program which includes automated exposure control, adjustment of the mA and/or kV according to patient size and/or use of iterative reconstruction technique. COMPARISON:  Earlier same day 0920 hours FINDINGS: Brain: Acute infarctions within the cerebellum appear unchanged. No new or progressive posterior fossa infarction. No hemorrhage. Progressive low-density and swelling within the right middle cerebral artery territory, primarily inferior division. Increased mass effect with right-to-left shift of 3.5 mm. Slight increase in petechial hemorrhage within the right parietal lobe. The largest single petechial accumulation measures 6 mm, axial image 21. No large confluent hematoma. Small area of cortical low density at the left frontoparietal junction appears similar. No sign of bleeding on the left. Vascular: No new vascular finding. Skull: Normal Sinuses/Orbits: Clear/normal Other: None IMPRESSION: 1. Progressive low-density and swelling within the right middle cerebral artery territory, primarily inferior division. Increased mass effect with right-to-left shift of 3.5 mm. 2. Slight increase in petechial hemorrhage within the right parietal lobe infarction. The largest single petechial accumulation measures 6 mm. No large confluent hematoma. Certainly, continued close observation is warranted. Electronically Signed   By: Nelson Chimes M.D.   On: 06/22/2022 17:14   CT HEAD WO CONTRAST  Result Date: 06/22/2022 CLINICAL DATA:  Stroke follow-up EXAM: CT HEAD WITHOUT CONTRAST TECHNIQUE: Contiguous axial images were obtained from the base of the skull through the vertex without intravenous contrast. RADIATION DOSE REDUCTION: This exam was performed according to the departmental dose-optimization program which includes automated exposure control, adjustment of the mA and/or kV according to patient size and/or use of iterative reconstruction technique. COMPARISON:  Head CT from  yesterday FINDINGS: Brain: Large area of cytotoxic edema in the posterior division of the right MCA territory with patchy petechial hemorrhage. Localized swelling. Unchanged left cerebellar infarct which is recent. More subacute left occipital cortex infarct. No hydrocephalus or collection. Vascular: No hyperdense vessel. Skull: Normal. Negative for fracture or focal lesion. Sinuses/Orbits: No acute finding. IMPRESSION: 1. Extensive right MCA territory infarct with patchy petechial hemorrhage. 2. Known recent infarcts in the left cerebrum and cerebellum. Electronically Signed   By: Jorje Guild M.D.   On: 06/22/2022 09:39   CT ANGIO HEAD NECK W WO CM (CODE STROKE)  Result Date: 06/19/2022 CLINICAL DATA:  Stroke suspected EXAM: CT ANGIOGRAPHY HEAD AND NECK TECHNIQUE: Multidetector CT imaging of the head and neck was performed using the standard protocol during bolus administration of intravenous contrast. Multiplanar CT image reconstructions and MIPs were obtained to evaluate the vascular anatomy. Carotid stenosis measurements (when applicable) are obtained utilizing NASCET criteria, using the distal internal carotid diameter as the denominator. RADIATION DOSE REDUCTION: This exam was performed according to the departmental dose-optimization program which includes automated exposure control, adjustment of the mA and/or kV according to patient size and/or use of iterative reconstruction technique. CONTRAST:  33m OMNIPAQUE IOHEXOL 350 MG/ML SOLN COMPARISON:  CTA head neck 06/14/2022, correlation is also made with same day CT head FINDINGS: CT HEAD FINDINGS For noncontrast findings, please see same day CT head. CTA NECK FINDINGS Aortic arch: Standard branching. Imaged portion shows no evidence of  aneurysm or dissection. No significant stenosis of the major arch vessel origins. Right carotid system: No evidence of dissection, occlusion, or hemodynamically significant stenosis (greater than 50%). Left carotid  system: No evidence of dissection, occlusion, or hemodynamically significant stenosis (greater than 50%). Vertebral arteries: No evidence of dissection, occlusion, or hemodynamically significant stenosis (greater than 50%). Skeleton: Redemonstrated multifocal sclerotic lesions in cervical and upper thoracic spine. No evidence of pathologic fracture. Other neck: Negative. Upper chest: Small bilateral pleural effusions with associated atelectasis, new from the prior exam. Review of the MIP images confirms the above findings CTA HEAD FINDINGS Anterior circulation: Both internal carotid arteries are patent to the termini, without significant stenosis. A1 segments patent. Normal anterior communicating artery. Anterior cerebral arteries are patent to their distal aspects. Occlusion of the distal right M1 (series 7, image 93), without evidence of reconstitution or flow in the right MCA territory. No left M1 stenosis or occlusion. The left MCA vessels branches are perfused without significant stenosis. Posterior circulation: Vertebral arteries patent to the vertebrobasilar junction without stenosis. Basilar patent to its distal aspect. Superior cerebellar arteries patent proximally. Patent P1 segments. PCAs perfused to their distal aspects without stenosis. The right posterior communicating artery is patent. Venous sinuses: As permitted by contrast timing, patent. Anatomic variants: None significant. Review of the MIP images confirms the above findings IMPRESSION: 1. Occlusion of the distal right M1, without evidence of reconstitution or flow in the right MCA territory. 2. No hemodynamically significant stenosis in the neck. 3. Small bilateral pleural effusions with associated atelectasis, new from the prior exam. 4. Redemonstrated multifocal sclerotic lesions in the cervical and upper thoracic spine, consistent with osseous metastatic disease. No evidence of pathologic fracture. Code stroke imaging results were  communicated on 06/15/2022 at 10:10 pm to provider Dr. Lorrin Goodell via secure text paging. Electronically Signed   By: Merilyn Baba M.D.   On: 06/27/2022 22:17   CT HEAD CODE STROKE WO CONTRAST  Result Date: 07/06/2022 CLINICAL DATA:  Code stroke. EXAM: CT HEAD WITHOUT CONTRAST TECHNIQUE: Contiguous axial images were obtained from the base of the skull through the vertex without intravenous contrast. RADIATION DOSE REDUCTION: This exam was performed according to the departmental dose-optimization program which includes automated exposure control, adjustment of the mA and/or kV according to patient size and/or use of iterative reconstruction technique. COMPARISON:  06/15/2022 FINDINGS: Brain: New area of hypodensity in the left mid to inferior cerebellum (series 2, image 6 and series 6, image 39), concerning for acute infarct. 7 mm focus of hyperdensity in the right occipital lobe, associated with an area of hypodensity and loss of gray-white differentiation, concerning for a small area of hemorrhagic transformation. Redemonstrated areas of loss of gray-white differentiation in the left occipital lobe without evidence of hemorrhagic transformation, but a possible additional area of hypodense in the adjacent left parietal lobe (series 2, image 17). No evidence of mass, mass effect, or midline shift. No hydrocephalus or extra-axial collection. Vascular: No hyperdense vessel. Skull: Negative for fracture or focal lesion. Sinuses/Orbits: Mild mucosal thickening in the left maxillary sinus. The orbits are unremarkable. Other: The mastoid air cells are well aerated. ASPECTS ([place] Stroke Program Early CT Score) - Ganglionic level infarction (caudate, lentiform nuclei, internal capsule, insula, M1-M3 cortex): 7 - Supraganglionic infarction (M4-M6 cortex): 3 Total score (0-10 with 10 being normal): 10 IMPRESSION: 1. New area of hypodensity in the left mid to inferior cerebellum, concerning for acute infarct. 2. 7 mm  focus of hyperdensity in the right occipital  lobe in an area of prior parieto-occipital infarct, concerning for a small area of hemorrhagic transformation. 3. New area of hypodensity in the left parietal lobe, concerning for additional acute infarct, adjacent to a previously noted area of infarct in the left occipital lobe, without evidence of hemorrhagic transformation. Code stroke imaging results were communicated on 06/26/2022 at 10:06 pm to provider Dr. Lorrin Goodell via secure text paging. Electronically Signed   By: Merilyn Baba M.D.   On: 07/03/2022 22:06   CT ABDOMEN PELVIS W CONTRAST  Result Date: 06/26/2022 CLINICAL DATA:  Metastatic prostate carcinoma bone pain EXAM: CT ABDOMEN AND PELVIS WITH CONTRAST TECHNIQUE: Multidetector CT imaging of the abdomen and pelvis was performed using the standard protocol following bolus administration of intravenous contrast. RADIATION DOSE REDUCTION: This exam was performed according to the departmental dose-optimization program which includes automated exposure control, adjustment of the mA and/or kV according to patient size and/or use of iterative reconstruction technique. CONTRAST:  67m OMNIPAQUE IOHEXOL 350 MG/ML SOLN COMPARISON:  CT 09/26/2019 bone scan 09/26/2019 FINDINGS: Lower chest: Bilateral layering effusions with lower lobe mild passive atelectasis. Increased bilateral gynecomastia. Hepatobiliary: Benign cyst in the LEFT hepatic lobe. Several gallstones within the lumen the gallbladder. No gallbladder inflammation. Pancreas: Pancreas is normal. No ductal dilatation. No pancreatic inflammation. Spleen: Normal spleen Adrenals/urinary tract: Adrenal glands and kidneys normal. No ureterolithiasis. Bladder is decompressed around a Foley catheter. Stomach/Bowel: Stomach, small bowel, appendix, and cecum are normal. The colon and rectosigmoid colon are normal. Vascular/Lymphatic: Abdominal aorta is normal caliber. No periportal or retroperitoneal adenopathy. No  pelvic adenopathy. Reproductive: Post prostatectomy Other: No pelvic lymphadenopathy. Musculoskeletal: Multiple sclerotic lesions in the pelvis and spine. The number of lesions is increased from [id number]. Sclerotic lesion at L 1 is similar but he multiple new lesions in the spine and pelvis noted. Example new sclerotic lesion the central sacrum measuring 23 mm on image 68/3. Lesion in the RIGHT iliac wing measuring 12 mm on image 65/3. Approximately 40 lesions noted. IMPRESSION: 1. Progression sclerotic skeletal metastasis compared to CT 09/26/2019. 2. No pelvic or retroperitoneal lymphadenopathy. 3. Increase in gynecomastia compared to prior. 4. Cholelithiasis without evidence cholecystitis. Electronically Signed   By: SSuzy BouchardM.D.   On: 07/12/2022 09:42   CT L-SPINE NO CHARGE  Result Date: 06/22/2022 CLINICAL DATA:  Progressive lumbar pain EXAM: CT LUMBAR SPINE WITHOUT CONTRAST TECHNIQUE: Multidetector CT imaging of the lumbar spine was performed without intravenous contrast administration. Multiplanar CT image reconstructions were also generated. RADIATION DOSE REDUCTION: This exam was performed according to the departmental dose-optimization program which includes automated exposure control, adjustment of the mA and/or kV according to patient size and/or use of iterative reconstruction technique. COMPARISON:  CT abdomen 09/14/2014 FINDINGS: Segmentation: 5 lumbar type vertebrae. Alignment: Minimal retrolisthesis of L2 on L3 and L3 on L4. Vertebrae: No acute fracture or subluxation. Sclerotic bone lesions throughout the visualized thoracolumbar spine, sacrum, and ilium concerning for metastatic disease. Paraspinal and other soft tissues: No acute paraspinal abnormality. Mild abdominal aortic atherosclerosis. Other: None Disc levels: Disc spaces: Degenerative disease with disc height loss at L3-4. T12-L1: Minimal broad-based disc bulge. Mild bilateral facet arthropathy. No foraminal or central canal  stenosis. L1-L2: Mild broad-based disc bulge. Mild bilateral facet arthropathy. No foraminal or central canal stenosis. L2-L3: Broad-based disc bulge. Moderate bilateral facet arthropathy. Mild spinal stenosis. Mild left foraminal stenosis. No right foraminal stenosis. L3-L4: Broad-based disc bulge. Mild bilateral facet arthropathy. Moderate spinal stenosis. Moderate left foraminal stenosis. No right foraminal  stenosis. L4-L5: Broad-based disc bulge. Mild bilateral facet arthropathy. Mild bilateral foraminal stenosis, left greater than right. No spinal stenosis. L5-S1: Broad-based disc bulge. Moderate left facet arthropathy. Mild left foraminal stenosis. No right foraminal stenosis. IMPRESSION: 1. No acute osseous injury of the lumbar spine. 2. Sclerotic bone lesions throughout the visualized thoracolumbar spine, sacrum, and ilium concerning for metastatic disease. 3. Diffuse lumbar spine spondylosis as described above. Electronically Signed   By: Kathreen Devoid M.D.   On: 07/04/2022 09:32   DG Abd 1 View  Result Date: 06/19/2022 CLINICAL DATA:  Distension EXAM: ABDOMEN - 1 VIEW COMPARISON:  CT 09/14/2014 FINDINGS: Mild diffuse increased small and large bowel gas. Bilateral hip replacements. Pelvic phleboliths. Amorphous density in the left upper quadrant could represent something in the enteral stream. No definite radiopaque calculi. IMPRESSION: Mild diffuse increased small and large bowel gas suggestive of mild ileus. Electronically Signed   By: Donavan Foil M.D.   On: 06/19/2022 23:28   DG Chest Port 1 View  Result Date: 06/18/2022 CLINICAL DATA:  Fever, urinary retention EXAM: PORTABLE CHEST 1 VIEW COMPARISON:  06/13/2022 FINDINGS: Left pacer unchanged. Mild cardiomegaly. No confluent opacities, effusions or edema. No acute bony abnormality. IMPRESSION: Mild cardiomegaly.  No active disease. Electronically Signed   By: Rolm Baptise M.D.   On: 06/18/2022 01:08   CT HEAD WO CONTRAST (5MM)  Result Date:  06/15/2022 CLINICAL DATA:  Stroke follow-up EXAM: CT HEAD WITHOUT CONTRAST TECHNIQUE: Contiguous axial images were obtained from the base of the skull through the vertex without intravenous contrast. RADIATION DOSE REDUCTION: This exam was performed according to the departmental dose-optimization program which includes automated exposure control, adjustment of the mA and/or kV according to patient size and/or use of iterative reconstruction technique. COMPARISON:  06/14/2022 FINDINGS: Brain: There are areas of loss of gray-white differentiation in the left occipital lobe and right parietal lobe, unchanged. No acute hemorrhage. Mild generalized volume loss. There is periventricular hypoattenuation compatible with chronic microvascular disease. Incidentally noted cavum septum pellucidum et vergae. Vascular: No hyperdense vessel or unexpected calcification. Skull: Normal. Negative for fracture or focal lesion. Sinuses/Orbits: No acute finding. Other: None. IMPRESSION: Unchanged areas of loss of gray-white differentiation in the left occipital lobe and right parietal lobe, consistent with acute/subacute infarcts. No acute hemorrhage. Electronically Signed   By: Ulyses Jarred M.D.   On: 06/15/2022 21:54   ECHOCARDIOGRAM COMPLETE  Result Date: 06/15/2022    ECHOCARDIOGRAM REPORT   Patient Name:   BRONTE KROPF [facility] Date of Exam: 06/15/2022 Medical Rec #:  [id number]        Height:       72.0 in Accession #:    [id number]       Weight:       227.1 lb Date of Birth:  May 14, 1951         BSA:          2.249 m Patient Age:    83 years         BP:           120/59 mmHg Patient Gender: M                HR:           88 bpm. Exam Location:  Inpatient Procedure: 2D Echo, Color Doppler and Cardiac Doppler Indications:    Stroke i63.9  History:        Patient has no prior history of Echocardiogram examinations.  Pacemaker; Risk Factors:Hypertension and Dyslipidemia.  Sonographer:    Raquel Sarna Senior RDCS Referring Phys:  [phone] [facility]  Sonographer Comments: Technically difficult due to poor echo windows. IMPRESSIONS  1. Difficult acoustic windows limit study Would recomm limited echo with Definity to confirm LVEF and wall motion. There appears to be hypokinesis of the mid/distal lateral, mid/distal septal, distal inferior walls and apical akinesis.. Left ventricular  ejection fraction, by estimation, is 30%. The left ventricle has moderately decreased function.  2. Right ventricular systolic function is normal. The right ventricular size is normal. There is normal pulmonary artery systolic pressure.  3. The mitral valve is normal in structure. Trivial mitral valve regurgitation.  4. The aortic valve is normal in structure. Aortic valve regurgitation is not visualized.  5. There is mild dilatation of the ascending aorta, measuring 42 mm.  6. The inferior vena cava is normal in size with greater than 50% respiratory variability, suggesting right atrial pressure of 3 mmHg. FINDINGS  Left Ventricle: Difficult acoustic windows limit study Would recomm limited echo with Definity to confirm LVEF and wall motion. There appears to be hypokinesis of the mid/distal lateral, mid/distal septal, distal inferior walls and apical akinesis. Left  ventricular ejection fraction, by estimation, is 30%. The left ventricle has moderately decreased function. The left ventricular internal cavity size was normal in size. There is no left ventricular hypertrophy. Right Ventricle: The right ventricular size is normal. Right vetricular wall thickness was not assessed. Right ventricular systolic function is normal. There is normal pulmonary artery systolic pressure. The tricuspid regurgitant velocity is 2.31 m/s, and with an assumed right atrial pressure of 3 mmHg, the estimated right ventricular systolic pressure is 14.7 mmHg. Left Atrium: Left atrial size was normal in size. Right Atrium: Right atrial size was normal in size. Pericardium: Trivial  pericardial effusion is present. Mitral Valve: The mitral valve is normal in structure. Trivial mitral valve regurgitation. Tricuspid Valve: The tricuspid valve is grossly normal. Tricuspid valve regurgitation is mild. Aortic Valve: The aortic valve is normal in structure. Aortic valve regurgitation is not visualized. Pulmonic Valve: The pulmonic valve was not well visualized. Pulmonic valve regurgitation is not visualized. No evidence of pulmonic stenosis. Aorta: There is mild dilatation of the ascending aorta, measuring 42 mm. Venous: The inferior vena cava is normal in size with greater than 50% respiratory variability, suggesting right atrial pressure of 3 mmHg. IAS/Shunts: No atrial level shunt detected by color flow Doppler. Additional Comments: A device lead is visualized.  LEFT VENTRICLE PLAX 2D LVIDd:         5.50 cm LVIDs:         3.30 cm LV PW:         1.10 cm LV IVS:        1.20 cm LVOT diam:     2.40 cm LV SV:         85 LV SV Index:   38 LVOT Area:     4.52 cm  LV Volumes (MOD) LV vol d, MOD A2C: 112.0 ml LV vol d, MOD A4C: 139.0 ml LV vol s, MOD A2C: 65.0 ml LV vol s, MOD A4C: 72.7 ml LV SV MOD A2C:     47.0 ml LV SV MOD A4C:     139.0 ml LV SV MOD BP:      56.6 ml RIGHT VENTRICLE RV S prime:     17.70 cm/s TAPSE (M-mode): 2.2 cm LEFT ATRIUM  Index        RIGHT ATRIUM           Index LA diam:        3.60 cm 1.60 cm/m   RA Area:     21.10 cm LA Vol (A2C):   66.4 ml 29.53 ml/m  RA Volume:   60.60 ml  26.95 ml/m LA Vol (A4C):   63.5 ml 28.24 ml/m LA Biplane Vol: 65.8 ml 29.26 ml/m  AORTIC VALVE LVOT Vmax:   102.00 cm/s LVOT Vmean:  71.100 cm/s LVOT VTI:    0.188 m  AORTA Ao Root diam: 4.00 cm Ao Asc diam:  4.20 cm TRICUSPID VALVE TR Peak grad:   21.3 mmHg TR Vmax:        231.00 cm/s  SHUNTS Systemic VTI:  0.19 m Systemic Diam: 2.40 cm Dorris Carnes MD Electronically signed by Dorris Carnes MD Signature Date/Time: 06/15/2022/4:20:16 PM    Final    CT ANGIO HEAD NECK W WO CM W PERF (CODE  STROKE)  Result Date: 06/14/2022 CLINICAL DATA:  Neuro deficit, acute, stroke suspected. Right-sided weakness. EXAM: CT ANGIOGRAPHY HEAD AND NECK TECHNIQUE: Multidetector CT imaging of the head and neck was performed using the standard protocol during bolus administration of intravenous contrast. Multiplanar CT image reconstructions and MIPs were obtained to evaluate the vascular anatomy. Carotid stenosis measurements (when applicable) are obtained utilizing NASCET criteria, using the distal internal carotid diameter as the denominator. RADIATION DOSE REDUCTION: This exam was performed according to the departmental dose-optimization program which includes automated exposure control, adjustment of the mA and/or kV according to patient size and/or use of iterative reconstruction technique. CONTRAST:  128m OMNIPAQUE IOHEXOL 350 MG/ML SOLN COMPARISON:  CT head without contrast 06/14/2022 at 2:50 p.m. FINDINGS: CTA NECK FINDINGS Aortic arch: Minimal atherosclerotic changes are present the distal aorta. Three vessel arch configuration is present. No significant stenosis is present at the great vessel origins. Right carotid system: Right common carotid artery is within normal limits. Minimal calcifications are present proximal right ICA without significant stenosis. Mild tortuosity is present in the distal cervical right ICA without significant stenosis. Left carotid system: The left common carotid artery is within normal limits. Bifurcation is unremarkable. Mild tortuosity is present cervical left ICA without significant stenosis. Vertebral arteries: The left vertebral artery is the dominant vessel. Both vertebral arteries originate from the subclavian arteries without significant stenosis. No significant stenosis is present in either vertebral artery in the neck. Skeleton: Multiple sclerotic lesions are present throughout the cervical and upper thoracic spine. Diffuse vertebral body involvement is present at T2 and  T3. Asymmetric right-sided involvement is present at T1 and T2. No pathologic fractures are present. Sclerotic lesions are present within ribs bilaterally. Other neck: Soft tissues the neck are otherwise unremarkable. Salivary glands are within normal limits. Thyroid is normal. No significant adenopathy is present. No focal mucosal or submucosal lesions are present. Upper chest: The lung apices are clear. Review of the MIP images confirms the above findings CTA HEAD FINDINGS Anterior circulation: Minimal atherosclerotic changes are present within the cavernous internal carotid arteries bilaterally. The A1 and M1 segments are normal. The anterior communicating artery is patent. MCA bifurcations are within normal limits bilaterally. The ACA and MCA branch vessels are within normal limits bilaterally. Posterior circulation: The left vertebral artery is dominant vessel. PICA origins are visualized and normal. Vertebrobasilar junction and basilar artery is normal. Both posterior cerebral arteries originate from basilar tip. The PCA branch vessels are normal. Venous  sinuses: The dural sinuses are patent. The straight sinus and deep cerebral veins are intact. Cortical veins are within normal limits. No significant vascular malformation is evident. Anatomic variants: None Review of the MIP images confirms the above findings IMPRESSION: 1. No emergent large vessel occlusion. 2. Minimal atherosclerotic changes within the proximal right ICA and cavernous internal carotid arteries bilaterally without significant stenosis. 3. Multiple sclerotic lesions throughout the cervical and upper thoracic spine and ribs bilaterally consistent with metastatic disease. No pathologic fractures are present. Electronically Signed   By: San Morelle M.D.   On: 06/14/2022 18:26   CT HEAD WO CONTRAST (5MM)  Result Date: 06/14/2022 CLINICAL DATA:  Abnormal CT of the head. Delayed images were obtained after CTA head to evaluate for  possible metastatic disease. EXAM: CT HEAD WITHOUT CONTRAST TECHNIQUE: Contiguous axial images were obtained from the base of the skull through the vertex without intravenous contrast. RADIATION DOSE REDUCTION: This exam was performed according to the departmental dose-optimization program which includes automated exposure control, adjustment of the mA and/or kV according to patient size and/or use of iterative reconstruction technique. COMPARISON:  CT head without contrast 06/14/2022 at 2:50 p.m. FINDINGS: Brain: Delayed images demonstrate no pathologic enhancement. Infarcts involving the posterior right parietal lobe and left occipital lobe are again seen. Central left cerebellar infarct is better visualized. Mild atrophy and white matter changes are stable. No significant interval change is present. Vascular: Normal intravascular enhancement is present. Skull: Calvarium is intact. No focal lytic or blastic lesions are present. No significant extracranial soft tissue lesion is present. Sinuses/Orbits: The paranasal sinuses and mastoid air cells are clear. The globes and orbits are within normal limits. IMPRESSION: 1. No pathologic enhancement to suggest metastatic disease. 2. Stable infarcts involving the posterior right parietal lobe and left occipital lobe. 3. Central left cerebellar infarct is better visualized. 4. Stable atrophy and white matter disease. This likely reflects the sequela of chronic microvascular ischemia. Electronically Signed   By: San Morelle M.D.   On: 06/14/2022 18:14   CT HEAD CODE STROKE WO CONTRAST  Result Date: 06/14/2022 CLINICAL DATA:  Code stroke. Neuro deficit, acute, stroke suspected. Right-sided weakness. EXAM: CT HEAD WITHOUT CONTRAST TECHNIQUE: Contiguous axial images were obtained from the base of the skull through the vertex without intravenous contrast. RADIATION DOSE REDUCTION: This exam was performed according to the departmental dose-optimization program which  includes automated exposure control, adjustment of the mA and/or kV according to patient size and/or use of iterative reconstruction technique. COMPARISON:  . FINDINGS: Brain: Focal hypoattenuation is present in the posterior right parietal lobe measuring up to 2.5 cm. Focal cortical hypoattenuation is also present in the posterior left occipital lobe on image 12 series 3 and image 41 of series 7. No other acute or focal cortical abnormalities are present. Deep brain nuclei are within normal limits. Insular ribbon is normal bilaterally. Cavum septum pellucidum noted. Ventricles are otherwise within normal limits. No significant extraaxial fluid collection is present. The brainstem and cerebellum are within normal limits. Vascular: No hyperdense vessel or unexpected calcification. Skull: Calvarium is intact. No focal lytic or blastic lesions are present. No significant extracranial soft tissue lesion is present. Sinuses/Orbits: The paranasal sinuses and mastoid air cells are clear. The globes and orbits are within normal limits. ASPECTS [facility] Stroke Program Early CT Score) - Ganglionic level infarction (caudate, lentiform nuclei, internal capsule, insula, M1-M3 cortex): 6/7 - Supraganglionic infarction (M4-M6 cortex): 3/3 Total score (0-10 with 10 being normal): 9/10 IMPRESSION: 1.  Focal hypoattenuation in the posterior right parietal lobe measuring up to 2.5 cm. This is concerning for acute or subacute infarct. 2. Focal cortical hypoattenuation in the posterior left occipital lobe is also concerning for acute or subacute infarct. 3. No other acute or focal cortical abnormalities. 4. Aspects is 9/10. The above was relayed via text pager to Dr. Lyn Records on 06/14/2022 at 15:08. Page Electronically Signed   By: San Morelle M.D.   On: 06/14/2022 15:09   DG CHEST PORT 1 VIEW  Result Date: 06/13/2022 CLINICAL DATA:  Encounter for SOB [id number] SOB (shortness of breath) [id number] EXAM: PORTABLE CHEST 1 VIEW  COMPARISON:  06/26/2014 FINDINGS: LEFT-sided pacemaker overlies normal cardiac silhouette. No effusion, infiltrate or pneumothorax. Versus arthroplasty LEFT shoulder. IMPRESSION: No acute cardiopulmonary process. Electronically Signed   By: Suzy Bouchard M.D.   On: 06/13/2022 14:34     PHYSICAL EXAM  Temp:  [98.8 F (37.1 C)-99.9 F (37.7 C)] 99 F (37.2 C) (12/11 0400) Pulse Rate:  [74-95] 93 (12/11 0800) Resp:  [16-30] 21 (12/11 0700) BP: (106-166)/(58-88) 130/73 (12/11 0800) SpO2:  [96 %-99 %] 98 % (12/11 0800)  General - Well nourished, well developed, in mild respiratory distress.  Ophthalmologic - fundi not visualized due to noncooperation.  Cardiovascular - Regular rhythm and rate, not in afib.  Neuro - limited exam with going to comfort care measures. Pt eyes closed, not open on voice or tactile stimulation. With forced eyes opening, eyes midline, pupils 46m, not reactive to light. Lips pale, no significant facial droop, not moving extremities spontaneously.    ASSESSMENT/PLAN Mr. RALEISTER LADYis a 73y.o. 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 No tPA given due to recent stroke and thrombocytopenia.  IR pursued  Stroke:  large right MCA infarct due to right M1 occlusion status post IR with TICI3 reperfusion, embolic secondary to new diagnosed A-fib /cardiomyopathy with low EF not on AC versus hypercoagulable state from advanced cancer CT new left cerebellum and right parietal infarcts, subacute right parietal and left occipital infarcts with petechial HT at right parietal. CT head and neck distal right M1 occlusion MRI not able to perform due to incompatible pacemaker Status post IR with TICI3 reperfusion Post IR CT no HT CT repeat 12/10 AM showed  extensive right MCA territory infarct with patchy petechial hemorrhage.  Known recent infarct in the left cerebellum and cerebrum LDL 531Was on SCDs for VTE prophylaxis No antithrombotic prior to admission, now on No antithrombotic due to severe anemia and thrombocytopenia Disposition: given poor prognosis, family requested comfort care measures.   Cerebral edema  Uncal herniation Neuro decline -> unresponsive CT head 12/10 5pm Progressive low-density and swelling within the right MCA, primarily inferior division. Increased mass effect with MLS 3.5 mm. Slight increase in petechial hemorrhage within the right parietal lobe infarction.   CT head midnight - Evolving cytotoxic edema involving the posterior right cerebral hemisphere, consistent with large evolving ischemic infarct. Progressive regional mass effect with worsened right to left shift, now measuring up to approximately 12 mm. Increased dilatation of the left lateral ventricle consistent with trapping. Associated hemorrhage within the area of infarction has increased and worsened. Dominant hematoma now measures approximately 23 mL  On 3% saline -> off now for comfort care measures.   Recent stroke 06/14/2022 admitted for right  arm ataxia, hematuria.  CT showed left occipital and right parietal infarct.  Also found to have new diagnosed A-fib.  CTA head and neck no LVO, metastasis lesion in skull.  EF 30%, LDL 98, A1c 5.6.  No antithrombotics due to severe anemia and thrombocytopenia.  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 Urology on board Status post cystoscopy 12/8 Chemo related pancytopenia  Severe anemia Severe thrombocytopenia Hb 9.0-6.9-8.8-7.9->6.7 Status post 4 unit PRBC this admission Platelet [id number] Status post 12 platelet transfusion this admission  Hypertension Stable BP goal was < 160  Now in comfort care measures  Hyperlipidemia Home meds: Lipitor 20 LDL 53, goal <  70 Was on Lipitor 20  Dysphagia Speech on board Passed a swallow ans was on dysphagia 3 and nectar thick liquid NPO for comfort care  Other Stroke Risk Factors Advanced age  Other Active Problems Complete heart block s/p pacemaker  Hospital day # 11  This patient is critically ill due to right large mca infarct, HT, hematuria, PAF, advanced cancer and at significant risk of neurological worsening, death form recurrent stroke, hemorrhagic conversion, brain herniation, severe bleeding. This patient's care requires constant monitoring of vital signs, hemodynamics, respiratory and cardiac monitoring, review of multiple databases, neurological assessment, discussion with family, other specialists and medical decision making of high complexity. I had long discussion with wife Dr. [name] and daughter and sons at bedside, reviewed neuro images, updated pt current condition, treatment plan and potential prognosis, and answered all the questions. They expressed understanding and appreciation. And after serious family discussion, they decided to pursue comfort care measures. I spent 45 minutes of neurocritical care time in the care of this patient.   Rosalin Hawking, MD PhD Stroke Neurology 21-Jul-2022 3:46 PM    To contact Stroke Continuity provider, please refer to [email or web address] After hours, contact General Neurology

## 2022-07-14 NOTE — Progress Notes (Signed)
Patient ID: Devin Mercado, male   DOB: Aug 04, 1950, 72 y.o.   MRN: [id number]  Events from last night noted.  Pt now receiving comfort care.  Talked with Mariann Laster at length expressed condolences.  Will avoid any further invasive treatment/procedure.  Please call if further questions.

## 2022-07-14 NOTE — Progress Notes (Incomplete)
us

## 2022-07-14 NOTE — Progress Notes (Signed)
Unfortunate events noted over the last 48 hours and followed by neurology.  At this point, will sign off with no active infection but the ID team is available if needed.   Thayer Headings, MD

## 2022-07-14 NOTE — Death Summary Note (Signed)
DEATH SUMMARY   Patient Details  Name: Devin Mercado MRN: [id number] DOB: [birth year]  Admission/Discharge Information   Admit Date:  06/14/2022  Date of Death: Date of Death: 06-25-22  Time of Death: Time of Death: 1607/09/26  Length of Stay: 09-24-22  Referring Physician: [name], MD   Reason(s) for Hospitalization  stroke  Diagnoses  Preliminary cause of death:  Recurrent stroke: large right MCA infarct due to right M1 occlusion status post IR with TICI3 reperfusion, embolic secondary to new diagnosed A-fib /cardiomyopathy with low EF not on AC versus hypercoagulable state from advanced cancer  Cerebral edema Brain herniation  Secondary Diagnoses (including complications and co-morbidities):  Recent stroke Hematuria Progressive metastatic prostate cancer Severe anemia Severe thrombocytopenia Hypertension/hypotension Hyperlipidemia Dysphagia Complete heart block status post pacemaker   Brief Hospital Course (including significant findings, care, treatment, and services provided and events leading to death)  Devin Mercado is a 72 y.o. year old male with history of hypertension, hyperlipidemia, complete heart block status post pacemaker, prostate cancer with metastasis to bone marrow, pancytopenia, recent stroke developed acute onset right gaze, left-sided weakness, facial droop, hemianopia and neglect. No tPA given due to recent stroke and thrombocytopenia.  IR pursued   Stroke:  large right MCA infarct due to right M1 occlusion status post IR with TICI3 reperfusion, embolic secondary to new diagnosed A-fib /cardiomyopathy with low EF not on AC versus hypercoagulable state from advanced cancer CT new left cerebellum and right parietal infarcts, subacute right parietal and left occipital infarcts with petechial HT at right parietal. CT head and neck distal right M1 occlusion MRI not able to perform due to incompatible pacemaker Status post IR with TICI3 reperfusion Post  IR CT no HT CT repeat 12/10 AM showed extensive right MCA territory infarct with patchy petechial hemorrhage.  Known recent infarct in the left cerebellum and cerebrum LDL 56 Was on SCDs for VTE prophylaxis No antithrombotic prior to admission, on No antithrombotic due to severe anemia and thrombocytopenia Disposition: given poor prognosis, family requested comfort care measures.    Cerebral edema  Uncal herniation Neuro decline -> unresponsive CT head 12/10 5pm Progressive low-density and swelling within the right MCA, primarily inferior division. Increased mass effect with MLS 3.5 mm. Slight increase in petechial hemorrhage within the right parietal lobe infarction.   CT head midnight - Evolving cytotoxic edema involving the posterior right cerebral hemisphere, consistent with large evolving ischemic infarct. Progressive regional mass effect with worsened right to left shift, now measuring up to approximately 12 mm. Increased dilatation of the left lateral ventricle consistent with trapping. Associated hemorrhage within the area of infarction has increased and worsened. Dominant hematoma now measures approximately 23 mL  On 3% saline -> off now for comfort care measures.    Recent stroke 06/14/2022 admitted for right arm ataxia, hematuria.  CT showed left occipital and right parietal infarct.  Also found to have new diagnosed A-fib.  CTA head and neck no LVO, metastasis lesion in skull.  EF 30%, LDL 98, A1c 5.6.  No antithrombotics due to severe anemia and thrombocytopenia.   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 Urology on board Status post cystoscopy 12/8 Chemo related pancytopenia   Severe anemia Severe thrombocytopenia Hb 9.0-6.9-8.8-7.9->6.7 Status post 4 unit PRBC this admission Platelet [id number] Status post 12 platelet transfusion this admission   Hypertension Stable BP goal was < 160  Now in comfort care measures    Hyperlipidemia Home meds: Lipitor 20 LDL 53, goal <  72 Was on Lipitor 20   Dysphagia Speech on board Passed a swallow ans was on dysphagia 3 and nectar thick liquid NPO for comfort care   Other Stroke Risk Factors Advanced age   Other Active Problems Complete heart block s/p pacemaker    Pertinent Labs and Studies  Significant Diagnostic Studies CT HEAD WO CONTRAST (5MM)  Result Date: 06/22/2022 CLINICAL DATA:  Follow-up examination for stroke. EXAM: CT HEAD WITHOUT CONTRAST TECHNIQUE: Contiguous axial images were obtained from the base of the skull through the vertex without intravenous contrast. RADIATION DOSE REDUCTION: This exam was performed according to the departmental dose-optimization program which includes automated exposure control, adjustment of the mA and/or kV according to patient size and/or use of iterative reconstruction technique. COMPARISON:  Prior study from earlier the same day as well as earlier exams. FINDINGS: Brain: Evolving cytotoxic edema involving the posterior right cerebral hemisphere, consistent with large evolving ischemic infarct. This is primarily right MCA distribution involvement, although there may be some ACA and/or PCA involvement as well. Progressive mass effect with worsened right to left shift, now measuring up to approximately 12 mm. Increased dilatation of the left lateral ventricle concerning for trapping. Associated basilar cistern crowding with right uncal herniation. No transtentorial herniation identified. Associated hemorrhage within the area of infarction has increased and worsened. Dominant hematoma now measures approximately 3.3 x 4.1 x 3.4 cm (estimated volume 23 mL) ( Heidelberg classification 2: Hematoma occupying >30% of the infarcted tissue with obvious mass effect. Interval development of a small subdural hygroma overlying the right cerebral convexity measuring up to 9 mm in thickness (series 7, image 37). Smaller of all vein acute  left cerebellar infarcts are increased in prominence from prior exam. Vascular: No abnormal hyperdense vessel. Calcified atherosclerosis present at the skull base. Skull: Scalp soft tissues and calvarium demonstrate no new finding. Sinuses/Orbits: Globes and orbital soft tissues demonstrate no acute finding. Paranasal sinuses are largely clear. No mastoid effusion. Other: Benign lipoma noted within the right postauricular region. IMPRESSION: 1. Evolving cytotoxic edema involving the posterior right cerebral hemisphere, consistent with large evolving ischemic infarct. Progressive regional mass effect with worsened right to left shift, now measuring up to approximately 12 mm. Increased dilatation of the left lateral ventricle consistent with trapping. 2. Associated hemorrhage within the area of infarction has increased and worsened. Dominant hematoma now measures approximately 23 mL ( Heidelberg classification 2: Hematoma occupying 30% of the infarcted tissue with obvious mass effect. 3. Interval development of a small subdural hygroma overlying the right cerebral convexity measuring up to 9 mm in thickness. 4. Additional smaller evolving recent left cerebellar infarcts, similar to prior. Critical Value/emergent results were called by telephone at the time of interpretation on 06/22/2022 at 11:55 pm to provider [facility] , who verbally acknowledged these results. Electronically Signed   By: Jeannine Boga M.D.   On: 06/22/2022 23:59   EEG adult  Result Date: 06/22/2022 Greta Doom, MD     06/22/2022  9:20 PM History: 72 yo M with r MCA infarct, evaluate for seizure Sedation: none Technique: This EEG was acquired with electrodes placed according to the International 10-20 electrode system (including Fp1, Fp2, F3, F4, C3, C4, P3, P4, O1, O2, T3, T4, T5, T6, A1, A2, Fz, Cz, Pz). The following electrodes were missing or displaced: none. Background: There is a poorly formed and poorly  sustained posterior dominant rhythm of 7 to 8 Hz which is better seen on the left than right.  In  addition, there is attenuation of normal faster frequencies on the right compared to the left.  There is also mild generalized irregular slow activity that is diffusely distributed. Photic stimulation: Physiologic driving is not performed EEG Abnormalities: 1) asymmetric fast activity with attenuation of the right 2) slow posterior dominant rhythm 3) generalized irregular slow activity Clinical Interpretation: This EEG is consistent with a focal cerebral dysfunction of the right hemisphere consistent with patient's known stroke in the setting of a more generalized nonspecific cerebral dysfunction (encephalopathy). There was no seizure or seizure predisposition recorded on this study. Please note that lack of epileptiform activity on EEG does not preclude the possibility of epilepsy. Roland Rack, MD [facility] [phone] If 7pm- 7am, please page neurology on call as listed in [place].   CT HEAD WO CONTRAST (5MM)  Result Date: 06/22/2022 CLINICAL DATA:  Follow-up stroke EXAM: CT HEAD WITHOUT CONTRAST TECHNIQUE: Contiguous axial images were obtained from the base of the skull through the vertex without intravenous contrast. RADIATION DOSE REDUCTION: This exam was performed according to the departmental dose-optimization program which includes automated exposure control, adjustment of the mA and/or kV according to patient size and/or use of iterative reconstruction technique. COMPARISON:  Earlier same day 0920 hours FINDINGS: Brain: Acute infarctions within the cerebellum appear unchanged. No new or progressive posterior fossa infarction. No hemorrhage. Progressive low-density and swelling within the right middle cerebral artery territory, primarily inferior division. Increased mass effect with right-to-left shift of 3.5 mm. Slight increase in petechial hemorrhage within the right parietal lobe.  The largest single petechial accumulation measures 6 mm, axial image 21. No large confluent hematoma. Small area of cortical low density at the left frontoparietal junction appears similar. No sign of bleeding on the left. Vascular: No new vascular finding. Skull: Normal Sinuses/Orbits: Clear/normal Other: None IMPRESSION: 1. Progressive low-density and swelling within the right middle cerebral artery territory, primarily inferior division. Increased mass effect with right-to-left shift of 3.5 mm. 2. Slight increase in petechial hemorrhage within the right parietal lobe infarction. The largest single petechial accumulation measures 6 mm. No large confluent hematoma. Certainly, continued close observation is warranted. Electronically Signed   By: Nelson Chimes M.D.   On: 06/22/2022 17:14   CT HEAD WO CONTRAST  Result Date: 06/22/2022 CLINICAL DATA:  Stroke follow-up EXAM: CT HEAD WITHOUT CONTRAST TECHNIQUE: Contiguous axial images were obtained from the base of the skull through the vertex without intravenous contrast. RADIATION DOSE REDUCTION: This exam was performed according to the departmental dose-optimization program which includes automated exposure control, adjustment of the mA and/or kV according to patient size and/or use of iterative reconstruction technique. COMPARISON:  Head CT from yesterday FINDINGS: Brain: Large area of cytotoxic edema in the posterior division of the right MCA territory with patchy petechial hemorrhage. Localized swelling. Unchanged left cerebellar infarct which is recent. More subacute left occipital cortex infarct. No hydrocephalus or collection. Vascular: No hyperdense vessel. Skull: Normal. Negative for fracture or focal lesion. Sinuses/Orbits: No acute finding. IMPRESSION: 1. Extensive right MCA territory infarct with patchy petechial hemorrhage. 2. Known recent infarcts in the left cerebrum and cerebellum. Electronically Signed   By: Jorje Guild M.D.   On: 06/22/2022  09:39   CT ANGIO HEAD NECK W WO CM (CODE STROKE)  Result Date: 06/13/2022 CLINICAL DATA:  Stroke suspected EXAM: CT ANGIOGRAPHY HEAD AND NECK TECHNIQUE: Multidetector CT imaging of the head and neck was performed using the standard protocol during bolus administration of intravenous contrast. Multiplanar CT image reconstructions and  MIPs were obtained to evaluate the vascular anatomy. Carotid stenosis measurements (when applicable) are obtained utilizing NASCET criteria, using the distal internal carotid diameter as the denominator. RADIATION DOSE REDUCTION: This exam was performed according to the departmental dose-optimization program which includes automated exposure control, adjustment of the mA and/or kV according to patient size and/or use of iterative reconstruction technique. CONTRAST:  14m OMNIPAQUE IOHEXOL 350 MG/ML SOLN COMPARISON:  CTA head neck 06/14/2022, correlation is also made with same day CT head FINDINGS: CT HEAD FINDINGS For noncontrast findings, please see same day CT head. CTA NECK FINDINGS Aortic arch: Standard branching. Imaged portion shows no evidence of aneurysm or dissection. No significant stenosis of the major arch vessel origins. Right carotid system: No evidence of dissection, occlusion, or hemodynamically significant stenosis (greater than 50%). Left carotid system: No evidence of dissection, occlusion, or hemodynamically significant stenosis (greater than 50%). Vertebral arteries: No evidence of dissection, occlusion, or hemodynamically significant stenosis (greater than 50%). Skeleton: Redemonstrated multifocal sclerotic lesions in cervical and upper thoracic spine. No evidence of pathologic fracture. Other neck: Negative. Upper chest: Small bilateral pleural effusions with associated atelectasis, new from the prior exam. Review of the MIP images confirms the above findings CTA HEAD FINDINGS Anterior circulation: Both internal carotid arteries are patent to the termini,  without significant stenosis. A1 segments patent. Normal anterior communicating artery. Anterior cerebral arteries are patent to their distal aspects. Occlusion of the distal right M1 (series 7, image 93), without evidence of reconstitution or flow in the right MCA territory. No left M1 stenosis or occlusion. The left MCA vessels branches are perfused without significant stenosis. Posterior circulation: Vertebral arteries patent to the vertebrobasilar junction without stenosis. Basilar patent to its distal aspect. Superior cerebellar arteries patent proximally. Patent P1 segments. PCAs perfused to their distal aspects without stenosis. The right posterior communicating artery is patent. Venous sinuses: As permitted by contrast timing, patent. Anatomic variants: None significant. Review of the MIP images confirms the above findings IMPRESSION: 1. Occlusion of the distal right M1, without evidence of reconstitution or flow in the right MCA territory. 2. No hemodynamically significant stenosis in the neck. 3. Small bilateral pleural effusions with associated atelectasis, new from the prior exam. 4. Redemonstrated multifocal sclerotic lesions in the cervical and upper thoracic spine, consistent with osseous metastatic disease. No evidence of pathologic fracture. Code stroke imaging results were communicated on 07/10/2022 at 10:10 pm to provider Dr. KLorrin Goodellvia secure text paging. Electronically Signed   By: AMerilyn BabaM.D.   On: 06/26/2022 22:17   CT HEAD CODE STROKE WO CONTRAST  Result Date: 07/07/2022 CLINICAL DATA:  Code stroke. EXAM: CT HEAD WITHOUT CONTRAST TECHNIQUE: Contiguous axial images were obtained from the base of the skull through the vertex without intravenous contrast. RADIATION DOSE REDUCTION: This exam was performed according to the departmental dose-optimization program which includes automated exposure control, adjustment of the mA and/or kV according to patient size and/or use of iterative  reconstruction technique. COMPARISON:  06/15/2022 FINDINGS: Brain: New area of hypodensity in the left mid to inferior cerebellum (series 2, image 6 and series 6, image 39), concerning for acute infarct. 7 mm focus of hyperdensity in the right occipital lobe, associated with an area of hypodensity and loss of gray-white differentiation, concerning for a small area of hemorrhagic transformation. Redemonstrated areas of loss of gray-white differentiation in the left occipital lobe without evidence of hemorrhagic transformation, but a possible additional area of hypodense in the adjacent left parietal lobe (series 2,  image 17). No evidence of mass, mass effect, or midline shift. No hydrocephalus or extra-axial collection. Vascular: No hyperdense vessel. Skull: Negative for fracture or focal lesion. Sinuses/Orbits: Mild mucosal thickening in the left maxillary sinus. The orbits are unremarkable. Other: The mastoid air cells are well aerated. ASPECTS ([place] Stroke Program Early CT Score) - Ganglionic level infarction (caudate, lentiform nuclei, internal capsule, insula, M1-M3 cortex): 7 - Supraganglionic infarction (M4-M6 cortex): 3 Total score (0-10 with 10 being normal): 10 IMPRESSION: 1. New area of hypodensity in the left mid to inferior cerebellum, concerning for acute infarct. 2. 7 mm focus of hyperdensity in the right occipital lobe in an area of prior parieto-occipital infarct, concerning for a small area of hemorrhagic transformation. 3. New area of hypodensity in the left parietal lobe, concerning for additional acute infarct, adjacent to a previously noted area of infarct in the left occipital lobe, without evidence of hemorrhagic transformation. Code stroke imaging results were communicated on 07/07/2022 at 10:06 pm to provider Dr. Lorrin Goodell via secure text paging. Electronically Signed   By: Merilyn Baba M.D.   On: 06/25/2022 22:06   CT ABDOMEN PELVIS W CONTRAST  Result Date: 06/25/2022 CLINICAL DATA:   Metastatic prostate carcinoma bone pain EXAM: CT ABDOMEN AND PELVIS WITH CONTRAST TECHNIQUE: Multidetector CT imaging of the abdomen and pelvis was performed using the standard protocol following bolus administration of intravenous contrast. RADIATION DOSE REDUCTION: This exam was performed according to the departmental dose-optimization program which includes automated exposure control, adjustment of the mA and/or kV according to patient size and/or use of iterative reconstruction technique. CONTRAST:  21m OMNIPAQUE IOHEXOL 350 MG/ML SOLN COMPARISON:  CT 09/26/2019 bone scan 09/26/2019 FINDINGS: Lower chest: Bilateral layering effusions with lower lobe mild passive atelectasis. Increased bilateral gynecomastia. Hepatobiliary: Benign cyst in the LEFT hepatic lobe. Several gallstones within the lumen the gallbladder. No gallbladder inflammation. Pancreas: Pancreas is normal. No ductal dilatation. No pancreatic inflammation. Spleen: Normal spleen Adrenals/urinary tract: Adrenal glands and kidneys normal. No ureterolithiasis. Bladder is decompressed around a Foley catheter. Stomach/Bowel: Stomach, small bowel, appendix, and cecum are normal. The colon and rectosigmoid colon are normal. Vascular/Lymphatic: Abdominal aorta is normal caliber. No periportal or retroperitoneal adenopathy. No pelvic adenopathy. Reproductive: Post prostatectomy Other: No pelvic lymphadenopathy. Musculoskeletal: Multiple sclerotic lesions in the pelvis and spine. [place] Sclerotic lesion at L 1 is similar but he multiple new lesions in the spine and pelvis noted. Example new sclerotic lesion the central sacrum measuring 23 mm on image 68/3. Lesion in the RIGHT iliac wing measuring 12 mm on image 65/3. Approximately 40 lesions noted. IMPRESSION: 1. Progression sclerotic skeletal metastasis compared to CT 09/26/2019. 2. No pelvic or retroperitoneal lymphadenopathy. 3. Increase in gynecomastia compared  to prior. 4. Cholelithiasis without evidence cholecystitis. Electronically Signed   By: SSuzy BouchardM.D.   On: 07/10/2022 09:42   CT L-SPINE NO CHARGE  Result Date: 06/15/2022 CLINICAL DATA:  Progressive lumbar pain EXAM: CT LUMBAR SPINE WITHOUT CONTRAST TECHNIQUE: Multidetector CT imaging of the lumbar spine was performed without intravenous contrast administration. Multiplanar CT image reconstructions were also generated. RADIATION DOSE REDUCTION: This exam was performed according to the departmental dose-optimization program which includes automated exposure control, adjustment of the mA and/or kV according to patient size and/or use of iterative reconstruction technique. COMPARISON:  CT abdomen 09/14/2014 FINDINGS: Segmentation: 5 lumbar type vertebrae. Alignment: Minimal retrolisthesis of L2 on L3 and L3 on L4. Vertebrae: No acute fracture or subluxation.  Sclerotic bone lesions throughout the visualized thoracolumbar spine, sacrum, and ilium concerning for metastatic disease. Paraspinal and other soft tissues: No acute paraspinal abnormality. Mild abdominal aortic atherosclerosis. Other: None Disc levels: Disc spaces: Degenerative disease with disc height loss at L3-4. T12-L1: Minimal broad-based disc bulge. Mild bilateral facet arthropathy. No foraminal or central canal stenosis. L1-L2: Mild broad-based disc bulge. Mild bilateral facet arthropathy. No foraminal or central canal stenosis. L2-L3: Broad-based disc bulge. Moderate bilateral facet arthropathy. Mild spinal stenosis. Mild left foraminal stenosis. No right foraminal stenosis. L3-L4: Broad-based disc bulge. Mild bilateral facet arthropathy. Moderate spinal stenosis. Moderate left foraminal stenosis. No right foraminal stenosis. L4-L5: Broad-based disc bulge. Mild bilateral facet arthropathy. Mild bilateral foraminal stenosis, left greater than right. No spinal stenosis. L5-S1: Broad-based disc bulge. Moderate left facet arthropathy. Mild left  foraminal stenosis. No right foraminal stenosis. IMPRESSION: 1. No acute osseous injury of the lumbar spine. 2. Sclerotic bone lesions throughout the visualized thoracolumbar spine, sacrum, and ilium concerning for metastatic disease. 3. Diffuse lumbar spine spondylosis as described above. Electronically Signed   By: Kathreen Devoid M.D.   On: 06/17/2022 09:32   DG Abd 1 View  Result Date: 06/19/2022 CLINICAL DATA:  Distension EXAM: ABDOMEN - 1 VIEW COMPARISON:  CT 09/14/2014 FINDINGS: Mild diffuse increased small and large bowel gas. Bilateral hip replacements. Pelvic phleboliths. Amorphous density in the left upper quadrant could represent something in the enteral stream. No definite radiopaque calculi. IMPRESSION: Mild diffuse increased small and large bowel gas suggestive of mild ileus. Electronically Signed   By: Donavan Foil M.D.   On: 06/19/2022 23:28   DG Chest Port 1 View  Result Date: 06/18/2022 CLINICAL DATA:  Fever, urinary retention EXAM: PORTABLE CHEST 1 VIEW COMPARISON:  06/13/2022 FINDINGS: Left pacer unchanged. Mild cardiomegaly. No confluent opacities, effusions or edema. No acute bony abnormality. IMPRESSION: Mild cardiomegaly.  No active disease. Electronically Signed   By: Rolm Baptise M.D.   On: 06/18/2022 01:08   CT HEAD WO CONTRAST (5MM)  Result Date: 06/15/2022 CLINICAL DATA:  Stroke follow-up EXAM: CT HEAD WITHOUT CONTRAST TECHNIQUE: Contiguous axial images were obtained from the base of the skull through the vertex without intravenous contrast. RADIATION DOSE REDUCTION: This exam was performed according to the departmental dose-optimization program which includes automated exposure control, adjustment of the mA and/or kV according to patient size and/or use of iterative reconstruction technique. COMPARISON:  06/14/2022 FINDINGS: Brain: There are areas of loss of gray-white differentiation in the left occipital lobe and right parietal lobe, unchanged. No acute hemorrhage. Mild  generalized volume loss. There is periventricular hypoattenuation compatible with chronic microvascular disease. Incidentally noted cavum septum pellucidum et vergae. Vascular: No hyperdense vessel or unexpected calcification. Skull: Normal. Negative for fracture or focal lesion. Sinuses/Orbits: No acute finding. Other: None. IMPRESSION: Unchanged areas of loss of gray-white differentiation in the left occipital lobe and right parietal lobe, consistent with acute/subacute infarcts. No acute hemorrhage. Electronically Signed   By: Ulyses Jarred M.D.   On: 06/15/2022 21:54   ECHOCARDIOGRAM COMPLETE  Result Date: 06/15/2022    ECHOCARDIOGRAM REPORT   Patient Name:   Devin Mercado [facility] [place] Date of Exam: 06/15/2022 Medical Rec #:  [id number]        Height:       72.0 in Accession #:    [id number]       Weight:       227.1 lb Date of Birth:  04-Aug-1950         BSA:  2.249 m Patient Age:    39 years         BP:           120/59 mmHg Patient Gender: M                HR:           88 bpm. Exam Location:  Inpatient Procedure: 2D Echo, Color Doppler and Cardiac Doppler Indications:    Stroke i63.9  History:        Patient has no prior history of Echocardiogram examinations.                 Pacemaker; Risk Factors:Hypertension and Dyslipidemia.  Sonographer:    Raquel Sarna Senior RDCS Referring Phys: [phone] [facility]  Sonographer Comments: Technically difficult due to poor echo windows. IMPRESSIONS  1. Difficult acoustic windows limit study Would recomm limited echo with Definity to confirm LVEF and wall motion. There appears to be hypokinesis of the mid/distal lateral, mid/distal septal, distal inferior walls and apical akinesis.. Left ventricular  ejection fraction, by estimation, is 30%. The left ventricle has moderately decreased function.  2. Right ventricular systolic function is normal. The right ventricular size is normal. There is normal pulmonary artery systolic pressure.  3. The mitral valve is normal in structure.  Trivial mitral valve regurgitation.  4. The aortic valve is normal in structure. Aortic valve regurgitation is not visualized.  5. There is mild dilatation of the ascending aorta, measuring 42 mm.  6. The inferior vena cava is normal in size with greater than 50% respiratory variability, suggesting right atrial pressure of 3 mmHg. FINDINGS  Left Ventricle: Difficult acoustic windows limit study Would recomm limited echo with Definity to confirm LVEF and wall motion. There appears to be hypokinesis of the mid/distal lateral, mid/distal septal, distal inferior walls and apical akinesis. Left  ventricular ejection fraction, by estimation, is 30%. The left ventricle has moderately decreased function. The left ventricular internal cavity size was normal in size. There is no left ventricular hypertrophy. Right Ventricle: The right ventricular size is normal. Right vetricular wall thickness was not assessed. Right ventricular systolic function is normal. There is normal pulmonary artery systolic pressure. The tricuspid regurgitant velocity is 2.31 m/s, and with an assumed right atrial pressure of 3 mmHg, the estimated right ventricular systolic pressure is 90.3 mmHg. Left Atrium: Left atrial size was normal in size. Right Atrium: Right atrial size was normal in size. Pericardium: Trivial pericardial effusion is present. Mitral Valve: The mitral valve is normal in structure. Trivial mitral valve regurgitation. Tricuspid Valve: The tricuspid valve is grossly normal. Tricuspid valve regurgitation is mild. Aortic Valve: The aortic valve is normal in structure. Aortic valve regurgitation is not visualized. Pulmonic Valve: The pulmonic valve was not well visualized. Pulmonic valve regurgitation is not visualized. No evidence of pulmonic stenosis. Aorta: There is mild dilatation of the ascending aorta, measuring 42 mm. Venous: The inferior vena cava is normal in size with greater than 50% respiratory variability, suggesting right  atrial pressure of 3 mmHg. IAS/Shunts: No atrial level shunt detected by color flow Doppler. Additional Comments: A device lead is visualized.  LEFT VENTRICLE PLAX 2D LVIDd:         5.50 cm LVIDs:         3.30 cm LV PW:         1.10 cm LV IVS:        1.20 cm LVOT diam:     2.40 cm LV SV:  85 LV SV Index:   38 LVOT Area:     4.52 cm  LV Volumes (MOD) LV vol d, MOD A2C: 112.0 ml LV vol d, MOD A4C: 139.0 ml LV vol s, MOD A2C: 65.0 ml LV vol s, MOD A4C: 72.7 ml LV SV MOD A2C:     47.0 ml LV SV MOD A4C:     139.0 ml LV SV MOD BP:      56.6 ml RIGHT VENTRICLE RV S prime:     17.70 cm/s TAPSE (M-mode): 2.2 cm LEFT ATRIUM             Index        RIGHT ATRIUM           Index LA diam:        3.60 cm 1.60 cm/m   RA Area:     21.10 cm LA Vol (A2C):   66.4 ml 29.53 ml/m  RA Volume:   60.60 ml  26.95 ml/m LA Vol (A4C):   63.5 ml 28.24 ml/m LA Biplane Vol: 65.8 ml 29.26 ml/m  AORTIC VALVE LVOT Vmax:   102.00 cm/s LVOT Vmean:  71.100 cm/s LVOT VTI:    0.188 m  AORTA Ao Root diam: 4.00 cm Ao Asc diam:  4.20 cm TRICUSPID VALVE TR Peak grad:   21.3 mmHg TR Vmax:        231.00 cm/s  SHUNTS Systemic VTI:  0.19 m Systemic Diam: 2.40 cm Dorris Carnes MD Electronically signed by Dorris Carnes MD Signature Date/Time: 06/15/2022/4:20:16 PM    Final    CT ANGIO HEAD NECK W WO CM W PERF (CODE STROKE)  Result Date: 06/14/2022 CLINICAL DATA:  Neuro deficit, acute, stroke suspected. Right-sided weakness. EXAM: CT ANGIOGRAPHY HEAD AND NECK TECHNIQUE: Multidetector CT imaging of the head and neck was performed using the standard protocol during bolus administration of intravenous contrast. Multiplanar CT image reconstructions and MIPs were obtained to evaluate the vascular anatomy. Carotid stenosis measurements (when applicable) are obtained utilizing NASCET criteria, using the distal internal carotid diameter as the denominator. RADIATION DOSE REDUCTION: This exam was performed according to the departmental dose-optimization program  which includes automated exposure control, adjustment of the mA and/or kV according to patient size and/or use of iterative reconstruction technique. CONTRAST:  125m OMNIPAQUE IOHEXOL 350 MG/ML SOLN COMPARISON:  CT head without contrast 06/14/2022 at 2:50 p.m. FINDINGS: CTA NECK FINDINGS Aortic arch: Minimal atherosclerotic changes are present the distal aorta. Three vessel arch configuration is present. No significant stenosis is present at the great vessel origins. Right carotid system: Right common carotid artery is within normal limits. Minimal calcifications are present proximal right ICA without significant stenosis. Mild tortuosity is present in the distal cervical right ICA without significant stenosis. Left carotid system: The left common carotid artery is within normal limits. Bifurcation is unremarkable. Mild tortuosity is present cervical left ICA without significant stenosis. Vertebral arteries: The left vertebral artery is the dominant vessel. Both vertebral arteries originate from the subclavian arteries without significant stenosis. No significant stenosis is present in either vertebral artery in the neck. Skeleton: Multiple sclerotic lesions are present throughout the cervical and upper thoracic spine. Diffuse vertebral body involvement is present at T2 and T3. Asymmetric right-sided involvement is present at T1 and T2. No pathologic fractures are present. Sclerotic lesions are present within ribs bilaterally. Other neck: Soft tissues the neck are otherwise unremarkable. Salivary glands are within normal limits. Thyroid is normal. No significant adenopathy is present. No focal mucosal  or submucosal lesions are present. Upper chest: The lung apices are clear. Review of the MIP images confirms the above findings CTA HEAD FINDINGS Anterior circulation: Minimal atherosclerotic changes are present within the cavernous internal carotid arteries bilaterally. The A1 and M1 segments are normal. The anterior  communicating artery is patent. MCA bifurcations are within normal limits bilaterally. The ACA and MCA branch vessels are within normal limits bilaterally. Posterior circulation: The left vertebral artery is dominant vessel. PICA origins are visualized and normal. Vertebrobasilar junction and basilar artery is normal. Both posterior cerebral arteries originate from basilar tip. The PCA branch vessels are normal. Venous sinuses: The dural sinuses are patent. The straight sinus and deep cerebral veins are intact. Cortical veins are within normal limits. No significant vascular malformation is evident. Anatomic variants: None Review of the MIP images confirms the above findings IMPRESSION: 1. No emergent large vessel occlusion. 2. Minimal atherosclerotic changes within the proximal right ICA and cavernous internal carotid arteries bilaterally without significant stenosis. 3. Multiple sclerotic lesions throughout the cervical and upper thoracic spine and ribs bilaterally consistent with metastatic disease. No pathologic fractures are present. Electronically Signed   By: San Morelle M.D.   On: 06/14/2022 18:26   CT HEAD WO CONTRAST (5MM)  Result Date: 06/14/2022 CLINICAL DATA:  Abnormal CT of the head. Delayed images were obtained after CTA head to evaluate for possible metastatic disease. EXAM: CT HEAD WITHOUT CONTRAST TECHNIQUE: Contiguous axial images were obtained from the base of the skull through the vertex without intravenous contrast. RADIATION DOSE REDUCTION: This exam was performed according to the departmental dose-optimization program which includes automated exposure control, adjustment of the mA and/or kV according to patient size and/or use of iterative reconstruction technique. COMPARISON:  CT head without contrast 06/14/2022 at 2:50 p.m. FINDINGS: Brain: Delayed images demonstrate no pathologic enhancement. Infarcts involving the posterior right parietal lobe and left occipital lobe are  again seen. Central left cerebellar infarct is better visualized. Mild atrophy and white matter changes are stable. No significant interval change is present. Vascular: Normal intravascular enhancement is present. Skull: Calvarium is intact. No focal lytic or blastic lesions are present. No significant extracranial soft tissue lesion is present. Sinuses/Orbits: The paranasal sinuses and mastoid air cells are clear. The globes and orbits are within normal limits. IMPRESSION: 1. No pathologic enhancement to suggest metastatic disease. 2. Stable infarcts involving the posterior right parietal lobe and left occipital lobe. 3. Central left cerebellar infarct is better visualized. 4. Stable atrophy and white matter disease. This likely reflects the sequela of chronic microvascular ischemia. Electronically Signed   By: San Morelle M.D.   On: 06/14/2022 18:14   CT HEAD CODE STROKE WO CONTRAST  Result Date: 06/14/2022 CLINICAL DATA:  Code stroke. Neuro deficit, acute, stroke suspected. Right-sided weakness. EXAM: CT HEAD WITHOUT CONTRAST TECHNIQUE: Contiguous axial images were obtained from the base of the skull through the vertex without intravenous contrast. RADIATION DOSE REDUCTION: This exam was performed according to the departmental dose-optimization program which includes automated exposure control, adjustment of the mA and/or kV according to patient size and/or use of iterative reconstruction technique. COMPARISON:  . FINDINGS: Brain: Focal hypoattenuation is present in the posterior right parietal lobe measuring up to 2.5 cm. Focal cortical hypoattenuation is also present in the posterior left occipital lobe on image 12 series 3 and image 41 of series 7. No other acute or focal cortical abnormalities are present. Deep brain nuclei are within normal limits. Insular ribbon is normal bilaterally.  Cavum septum pellucidum noted. Ventricles are otherwise within normal limits. No significant extraaxial fluid  collection is present. The brainstem and cerebellum are within normal limits. Vascular: No hyperdense vessel or unexpected calcification. Skull: Calvarium is intact. No focal lytic or blastic lesions are present. No significant extracranial soft tissue lesion is present. Sinuses/Orbits: The paranasal sinuses and mastoid air cells are clear. The globes and orbits are within normal limits. ASPECTS [facility] Stroke Program Early CT Score) - Ganglionic level infarction (caudate, lentiform nuclei, internal capsule, insula, M1-M3 cortex): 6/7 - Supraganglionic infarction (M4-M6 cortex): 3/3 Total score (0-10 with 10 being normal): 9/10 IMPRESSION: 1. Focal hypoattenuation in the posterior right parietal lobe measuring up to 2.5 cm. This is concerning for acute or subacute infarct. 2. Focal cortical hypoattenuation in the posterior left occipital lobe is also concerning for acute or subacute infarct. 3. No other acute or focal cortical abnormalities. 4. Aspects is 9/10. The above was relayed via text pager to Dr. Lyn Records on 06/14/2022 at 15:08. Page Electronically Signed   By: San Morelle M.D.   On: 06/14/2022 15:09   DG CHEST PORT 1 VIEW  Result Date: 06/13/2022 CLINICAL DATA:  Encounter for SOB [id number] SOB (shortness of breath) [id number] EXAM: PORTABLE CHEST 1 VIEW COMPARISON:  06/26/2014 FINDINGS: LEFT-sided pacemaker overlies normal cardiac silhouette. No effusion, infiltrate or pneumothorax. Versus arthroplasty LEFT shoulder. IMPRESSION: No acute cardiopulmonary process. Electronically Signed   By: Suzy Bouchard M.D.   On: 06/13/2022 14:34    Microbiology Recent Results (from the past 240 hour(s))  Culture, blood (Routine X 2) w Reflex to ID Panel     Status: None   Collection Time: 06/13/22  7:01 PM   Specimen: BLOOD  Result Value Ref Range Status   Specimen Description   Final    BLOOD LEFT ANTECUBITAL Performed at [facility] [address], [place], [place] [address]     Special Requests   Final    BOTTLES DRAWN AEROBIC ONLY Blood Culture adequate volume Performed at [facility] [address], [place], [place] [address]    Culture   Final    NO GROWTH 5 DAYS Performed at [facility], [place] [address], [place], [place] [address]    Report Status 06/18/2022 FINAL  Final  Culture, blood (Routine X 2) w Reflex to ID Panel     Status: None   Collection Time: 06/13/22  7:04 PM   Specimen: BLOOD  Result Value Ref Range Status   Specimen Description   Final    BLOOD BLOOD RIGHT HAND Performed at [facility] [address], [place], [place] [address]    Special Requests   Final    BOTTLES DRAWN AEROBIC ONLY Blood Culture results may not be optimal due to an inadequate volume of blood received in culture bottles Performed at [facility] [address], [place], [place] [address]    Culture   Final    NO GROWTH 5 DAYS Performed at [facility], [place] [address], [place], [place] [address]    Report Status 06/18/2022 FINAL  Final  Gastrointestinal Panel by PCR , Stool     Status: None   Collection Time: 06/16/22  4:01 AM   Specimen: Stool  Result Value Ref Range Status   Campylobacter species NOT DETECTED NOT DETECTED Final   Plesimonas shigelloides NOT DETECTED NOT DETECTED Final   Salmonella species NOT DETECTED NOT DETECTED Final   Yersinia  enterocolitica NOT DETECTED NOT DETECTED Final   Vibrio species NOT DETECTED NOT DETECTED Final   Vibrio cholerae NOT DETECTED NOT DETECTED Final   Enteroaggregative E coli (EAEC) NOT DETECTED NOT DETECTED Final   Enteropathogenic E coli (EPEC) NOT DETECTED NOT DETECTED Final   Enterotoxigenic E coli (ETEC) NOT DETECTED NOT DETECTED Final   Shiga like toxin producing E coli (STEC) NOT DETECTED NOT DETECTED Final   Shigella/Enteroinvasive E coli (EIEC) NOT DETECTED NOT DETECTED Final   Cryptosporidium NOT DETECTED NOT DETECTED Final    Cyclospora cayetanensis NOT DETECTED NOT DETECTED Final   Entamoeba histolytica NOT DETECTED NOT DETECTED Final   Giardia lamblia NOT DETECTED NOT DETECTED Final   Adenovirus F40/41 NOT DETECTED NOT DETECTED Final   Astrovirus NOT DETECTED NOT DETECTED Final   Norovirus GI/GII NOT DETECTED NOT DETECTED Final   Rotavirus A NOT DETECTED NOT DETECTED Final   Sapovirus (I, II, IV, and V) NOT DETECTED NOT DETECTED Final    Comment: Performed at [facility], [address] [place], [place] [address]  SARS Coronavirus 2 by RT PCR (hospital order, performed in [facility] hospital lab) *cepheid single result test* Anterior Nasal Swab     Status: None   Collection Time: 06/14/2022 10:20 PM   Specimen: Anterior Nasal Swab  Result Value Ref Range Status   SARS Coronavirus 2 by RT PCR NEGATIVE NEGATIVE Final    Comment: (NOTE) SARS-CoV-2 target nucleic acids are NOT DETECTED.  The SARS-CoV-2 RNA is generally detectable in upper and lower respiratory specimens during the acute phase of infection. The lowest concentration of SARS-CoV-2 viral copies this assay can detect is 250 copies / mL. A negative result does not preclude SARS-CoV-2 infection and should not be used as the sole basis for treatment or other patient management decisions.  A negative result may occur with improper specimen collection / handling, submission of specimen other than nasopharyngeal swab, presence of viral mutation(s) within the areas targeted by this assay, and inadequate number of viral copies (<250 copies / mL). A negative result must be combined with clinical observations, patient history, and epidemiological information.  Fact Sheet for Patients:   https://www.patel.info/  Fact Sheet for Healthcare Providers: https://hall.com/  This test is not yet approved or  cleared by the [place] FDA and has been authorized for detection and/or diagnosis of SARS-CoV-2  by FDA under an Emergency Use Authorization (EUA).  This EUA will remain in effect (meaning this test can be used) for the duration of the COVID-19 declaration under Section 564(b)(1) of the Act, 21 U.S.C. section 360bbb-3(b)(1), unless the authorization is terminated or revoked sooner.  Performed at [facility], [place] [address] [place], [place] [address]   MRSA Next Gen by PCR, Nasal     Status: None   Collection Time: 06/22/22  1:25 AM   Specimen: Nasal Mucosa; Nasal Swab  Result Value Ref Range Status   MRSA by PCR Next Gen NOT DETECTED NOT DETECTED Final    Comment: (NOTE) The GeneXpert MRSA Assay (FDA approved for NASAL specimens only), is one component of a comprehensive MRSA colonization surveillance program. It is not intended to diagnose MRSA infection nor to guide or monitor treatment for MRSA infections. Test performance is not FDA approved in patients less than 70 years old. Performed at [facility], [place] [address], [place], [place] [address]     Lab Basic Metabolic Panel: Recent Labs  Lab 06/19/22 [phone] 06/18/2022 [phone] 07/08/2022 0509 06/22/22  5176 07-20-22 0050 July 20, 2022 0545  NA 128* 129* 130* 132* 135 138  K 4.3 4.2 4.3 4.0  --  4.1  CL 100 97* 98 102  --  109  CO2 18* 19* 19* 15*  --  18*  GLUCOSE 116* 115* 156* 156*  --  142*  BUN 25* 29* 30* 41*  --  39*  CREATININE 1.14 1.27* 1.26* 1.25*  --  1.37*  CALCIUM 7.0* 6.7* 6.4* 5.9*  --  6.3*   Liver Function Tests: Recent Labs  Lab 06/22/22 0642 07-20-22 0545  AST 43* 41  ALT 23 19  ALKPHOS 86 75  BILITOT 0.4 0.5  PROT 4.4* 4.4*  ALBUMIN 1.6* 1.6*   No results for input(s): "LIPASE", "AMYLASE" in the last 168 hours. No results for input(s): "AMMONIA" in the last 168 hours. CBC: Recent Labs  Lab 06/19/22 2047 07/12/2022 0312 07/13/2022 1702 06/19/2022 0509 06/22/22 0642 07/20/22 0545  WBC 3.4* 3.9* 4.9 4.3 5.1 3.6*  NEUTROABS 2.3  --   --   --  4.4  --   HGB 6.9* 8.8* 8.7* 8.5*  7.9* 6.7*  HCT 21.0* 24.5* 24.8* 23.4* 23.6* 19.7*  MCV 88.2 81.1 82.9 81.8 84.6 85.7  PLT 14* 31*  30* 93* 72* 46* 21*   Cardiac Enzymes: No results for input(s): "CKTOTAL", "CKMB", "CKMBINDEX", "TROPONINI" in the last 168 hours. Sepsis Labs: Recent Labs  Lab 06/17/22 0654 06/18/22 0330 07/06/2022 1702 06/29/2022 0509 06/22/22 0642 2022-07-20 0545  PROCALCITON 4.00  --   --   --   --   --   WBC 3.5*   < > 4.9 4.3 5.1 3.6*   < > = values in this interval not displayed.    Procedures/Operations  Mechanical thrombectomy Cystoscopy    Rosalin Hawking 07-20-2022, 4:22 PM

## 2022-07-14 NOTE — Progress Notes (Signed)
SLP Cancellation Note  Patient Details Name: AMAY MIJANGOS MRN: [id number] DOB: Oct 10, 1950   Cancelled treatment:       Reason Eval/Treat Not Completed: Other (comment). Given worsened severity of stroke and changes in plan of care, will defer MBS that had been planned for today. SLP will sign off. Please reorder if needs arise.    Oran Dillenburg, Katherene Ponto Jul 05, 2022, 9:16 AM
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
## 2022-07-14 NOTE — Progress Notes (Addendum)
I was notified of a change in neuro status with increased somnolence, shallow breaths with tachypnea, diaphoretic and pupillary change with R pupil larger than L and unreactive.  A STAT CTH was obtained which demonstrated large Right hemispheric stroke with evolving cytotoxic edema and significantly worsened mass effect and midline shift with trapping of the left ventricle, concern for uncal herniation of the brain and worsening of the earlier noted hematoma which now measures about 46m. Also notable is small subdural hygroma.  I attempted to contact wife Dr. WShanon Aceover the listed phone number but unfortunately unable to get in touch with her despite multiple attempts. She was at the bedside for a really long time earlier but seems like she left for home. Patient's daughter and 2 sons are at the bedside and I spoke to them in detail about the findings of the scan.  Patient is a poor surgical candidate for hemicrani given his comorbidities including metastatic prostate cancer and hypercoagulability, large stroke and pancytopenia.  On exam, he has sonorous respiration, shallow and tachypneic. R pupils is round and about 5543mand unreactive to light, left pupil is 43m58mround and unreactive to light. Corneals intact BL. Has some movement in RUE, BL lower extremities with upgoing planters.  Plan: - Will given 250m643mpertonic saline bolus and start him on 75cc/Hr of Hypertonic saline. - Will discuss with Dr. NundKathyrn Sheriffh the Neurosurgery team. - I strongly encouraged family to consider comfort care at this time. - Will discuss with wife as soon as able to get in touch with her.   Discussed imaging with family and discussed code status. Patient is DNR. They are still processing the decline in his status at this time.   This patient is critically ill and at significant risk of neurological worsening, death and care requires constant monitoring of vital signs, hemodynamics,respiratory and  cardiac monitoring, neurological assessment, discussion with family, other specialists and medical decision making of high complexity. I spent 30 minutes of neurocritical care time  in the care of  this patient. This was time spent independent of any time provided by nurse practitioner or PA.  SalmDonnetta Simpers[facility] Pager Number [phone]:33 AM

## 2022-07-14 NOTE — Progress Notes (Signed)
Inpatient Rehab Admissions Coordinator:   CIR will sign off this time. If Pt. Makes recovery and desires CIR, MD may reconsult at that time.   Clemens Catholic, [place], [place] Admissions Coordinator  [phone] ([place]) [phone] (office)

## 2022-07-14 NOTE — Progress Notes (Signed)
PT Cancellation Note  Patient Details Name: Devin Mercado MRN: [id number] DOB: 28-May-1951   Cancelled Treatment:    Reason Eval/Treat Not Completed: Other (comment). Pt with further medical regression. Per RN, family leaning for comfort care. Awaiting discussion with MD this morning. PT to hold until notified if/when to progress mobility again.  Kittie Plater, PT, DPT Acute [facility] Services Secure chat preferred Office #: [phone]    Berline Lopes 06/28/22, 8:46 AM

## 2022-07-14 DEATH — deceased

## 2022-07-29 ENCOUNTER — Telehealth: Payer: Self-pay | Admitting: Family Medicine

## 2022-07-29 NOTE — Telephone Encounter (Signed)
..  Type of form received: FMLA   Additional comments:   Received [id number]   Form should be Faxed [phone]  Form should be mailed to:  na  Is patient requesting call for pickup: Daughter   Form placed:   Dr Ronney Lion folder  Attach charge sheet.  yes Individual made aware of 3-5 business day turn around (Y/N)?  yes

## 2022-07-30 NOTE — Telephone Encounter (Signed)
Completed and placed on your desk Devin Mercado

## 2022-07-30 NOTE — Telephone Encounter (Signed)
Form placed in your review folder.

## 2022-07-31 NOTE — Telephone Encounter (Signed)
Forms have been faxed.

## 2023-05-21 ENCOUNTER — Encounter: Payer: Medicare PPO | Admitting: Family Medicine
